# Patient Record
Sex: Male | Born: 1935 | Race: White | Hispanic: No | Marital: Married | Smoking: Former smoker
Health system: Southern US, Community
[De-identification: ages and names within clinical notes are randomized; demographics above are authoritative.]

## PROBLEM LIST (undated history)

## (undated) DIAGNOSIS — G4733 Obstructive sleep apnea (adult) (pediatric): Secondary | ICD-10-CM

## (undated) DIAGNOSIS — I4821 Permanent atrial fibrillation: Secondary | ICD-10-CM

## (undated) DIAGNOSIS — F32A Depression, unspecified: Secondary | ICD-10-CM

## (undated) DIAGNOSIS — C4491 Basal cell carcinoma of skin, unspecified: Secondary | ICD-10-CM

## (undated) DIAGNOSIS — C4492 Squamous cell carcinoma of skin, unspecified: Secondary | ICD-10-CM

## (undated) DIAGNOSIS — M5030 Other cervical disc degeneration, unspecified cervical region: Secondary | ICD-10-CM

## (undated) DIAGNOSIS — R569 Unspecified convulsions: Secondary | ICD-10-CM

## (undated) DIAGNOSIS — I2510 Atherosclerotic heart disease of native coronary artery without angina pectoris: Secondary | ICD-10-CM

## (undated) DIAGNOSIS — F329 Major depressive disorder, single episode, unspecified: Secondary | ICD-10-CM

## (undated) DIAGNOSIS — I639 Cerebral infarction, unspecified: Secondary | ICD-10-CM

## (undated) DIAGNOSIS — J189 Pneumonia, unspecified organism: Secondary | ICD-10-CM

## (undated) DIAGNOSIS — C61 Malignant neoplasm of prostate: Secondary | ICD-10-CM

## (undated) DIAGNOSIS — F0390 Unspecified dementia without behavioral disturbance: Secondary | ICD-10-CM

## (undated) DIAGNOSIS — I495 Sick sinus syndrome: Secondary | ICD-10-CM

## (undated) DIAGNOSIS — I739 Peripheral vascular disease, unspecified: Secondary | ICD-10-CM

## (undated) DIAGNOSIS — D229 Melanocytic nevi, unspecified: Secondary | ICD-10-CM

## (undated) DIAGNOSIS — E785 Hyperlipidemia, unspecified: Secondary | ICD-10-CM

## (undated) DIAGNOSIS — K219 Gastro-esophageal reflux disease without esophagitis: Secondary | ICD-10-CM

## (undated) DIAGNOSIS — I10 Essential (primary) hypertension: Secondary | ICD-10-CM

## (undated) DIAGNOSIS — Z87442 Personal history of urinary calculi: Secondary | ICD-10-CM

## (undated) DIAGNOSIS — J449 Chronic obstructive pulmonary disease, unspecified: Secondary | ICD-10-CM

## (undated) DIAGNOSIS — R413 Other amnesia: Secondary | ICD-10-CM

## (undated) HISTORY — DX: Gastro-esophageal reflux disease without esophagitis: K21.9

## (undated) HISTORY — PX: NECK SURGERY: SHX720

## (undated) HISTORY — DX: Atherosclerotic heart disease of native coronary artery without angina pectoris: I25.10

## (undated) HISTORY — DX: Depression, unspecified: F32.A

## (undated) HISTORY — DX: Obstructive sleep apnea (adult) (pediatric): G47.33

## (undated) HISTORY — DX: Other amnesia: R41.3

## (undated) HISTORY — DX: Other cervical disc degeneration, unspecified cervical region: M50.30

## (undated) HISTORY — DX: Permanent atrial fibrillation: I48.21

## (undated) HISTORY — DX: Essential (primary) hypertension: I10

## (undated) HISTORY — DX: Cerebral infarction, unspecified: I63.9

## (undated) HISTORY — DX: Sick sinus syndrome: I49.5

## (undated) HISTORY — PX: PACEMAKER INSERTION: SHX728

## (undated) HISTORY — DX: Malignant neoplasm of prostate: C61

## (undated) HISTORY — DX: Major depressive disorder, single episode, unspecified: F32.9

## (undated) HISTORY — DX: Peripheral vascular disease, unspecified: I73.9

## (undated) HISTORY — DX: Hyperlipidemia, unspecified: E78.5

## (undated) HISTORY — PX: INSERT / REPLACE / REMOVE PACEMAKER: SUR710

---
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
## 1990-04-26 DIAGNOSIS — D229 Melanocytic nevi, unspecified: Secondary | ICD-10-CM

## 1990-04-26 HISTORY — DX: Melanocytic nevi, unspecified: D22.9

## 1997-10-12 DIAGNOSIS — I639 Cerebral infarction, unspecified: Secondary | ICD-10-CM

## 1997-10-12 HISTORY — PX: OTHER SURGICAL HISTORY: SHX169

## 1997-10-12 HISTORY — DX: Cerebral infarction, unspecified: I63.9

## 1998-03-07 ENCOUNTER — Inpatient Hospital Stay (HOSPITAL_COMMUNITY): Admission: EM | Admit: 1998-03-07 | Discharge: 1998-03-15 | Payer: Self-pay | Admitting: *Deleted

## 1998-03-15 ENCOUNTER — Inpatient Hospital Stay (HOSPITAL_COMMUNITY)
Admission: RE | Admit: 1998-03-15 | Discharge: 1998-04-10 | Payer: Self-pay | Admitting: Physical Medicine & Rehabilitation

## 1998-04-10 ENCOUNTER — Encounter: Admission: RE | Admit: 1998-04-10 | Discharge: 1998-07-09 | Payer: Self-pay | Admitting: Internal Medicine

## 1998-06-20 ENCOUNTER — Ambulatory Visit (HOSPITAL_COMMUNITY): Admission: RE | Admit: 1998-06-20 | Discharge: 1998-06-20 | Payer: Self-pay | Admitting: *Deleted

## 1998-08-08 ENCOUNTER — Encounter: Payer: Self-pay | Admitting: *Deleted

## 1998-08-09 ENCOUNTER — Inpatient Hospital Stay: Admission: RE | Admit: 1998-08-09 | Discharge: 1998-08-11 | Payer: Self-pay | Admitting: *Deleted

## 1999-04-21 ENCOUNTER — Encounter: Admission: RE | Admit: 1999-04-21 | Discharge: 1999-07-20 | Payer: Self-pay | Admitting: *Deleted

## 1999-07-22 ENCOUNTER — Ambulatory Visit (HOSPITAL_COMMUNITY): Admission: RE | Admit: 1999-07-22 | Discharge: 1999-07-22 | Payer: Self-pay | Admitting: Pediatrics

## 1999-08-12 ENCOUNTER — Encounter: Admission: RE | Admit: 1999-08-12 | Discharge: 1999-09-10 | Payer: Self-pay | Admitting: *Deleted

## 2000-06-16 ENCOUNTER — Ambulatory Visit (HOSPITAL_BASED_OUTPATIENT_CLINIC_OR_DEPARTMENT_OTHER): Admission: RE | Admit: 2000-06-16 | Discharge: 2000-06-16 | Payer: Self-pay | Admitting: *Deleted

## 2000-06-16 ENCOUNTER — Encounter (INDEPENDENT_AMBULATORY_CARE_PROVIDER_SITE_OTHER): Payer: Self-pay | Admitting: *Deleted

## 2000-10-07 ENCOUNTER — Ambulatory Visit (HOSPITAL_COMMUNITY): Admission: RE | Admit: 2000-10-07 | Discharge: 2000-10-07 | Payer: Self-pay | Admitting: Internal Medicine

## 2000-10-12 HISTORY — PX: CORONARY ARTERY BYPASS GRAFT: SHX141

## 2001-06-07 ENCOUNTER — Encounter: Payer: Self-pay | Admitting: *Deleted

## 2001-06-08 ENCOUNTER — Inpatient Hospital Stay (HOSPITAL_COMMUNITY): Admission: RE | Admit: 2001-06-08 | Discharge: 2001-06-19 | Payer: Self-pay | Admitting: *Deleted

## 2001-06-09 ENCOUNTER — Encounter: Payer: Self-pay | Admitting: Cardiothoracic Surgery

## 2001-06-10 ENCOUNTER — Encounter: Payer: Self-pay | Admitting: Cardiothoracic Surgery

## 2001-06-11 ENCOUNTER — Encounter: Payer: Self-pay | Admitting: Cardiothoracic Surgery

## 2001-06-11 ENCOUNTER — Encounter: Payer: Self-pay | Admitting: Thoracic Surgery (Cardiothoracic Vascular Surgery)

## 2001-06-12 ENCOUNTER — Encounter: Payer: Self-pay | Admitting: Thoracic Surgery (Cardiothoracic Vascular Surgery)

## 2001-06-13 ENCOUNTER — Encounter: Payer: Self-pay | Admitting: Thoracic Surgery (Cardiothoracic Vascular Surgery)

## 2001-06-14 ENCOUNTER — Encounter: Payer: Self-pay | Admitting: Cardiothoracic Surgery

## 2001-06-14 ENCOUNTER — Encounter: Payer: Self-pay | Admitting: Thoracic Surgery (Cardiothoracic Vascular Surgery)

## 2001-06-16 ENCOUNTER — Encounter: Payer: Self-pay | Admitting: Cardiothoracic Surgery

## 2001-06-17 ENCOUNTER — Encounter: Payer: Self-pay | Admitting: Cardiothoracic Surgery

## 2001-07-12 ENCOUNTER — Encounter: Payer: Self-pay | Admitting: Cardiothoracic Surgery

## 2001-07-12 ENCOUNTER — Ambulatory Visit (HOSPITAL_COMMUNITY): Admission: RE | Admit: 2001-07-12 | Discharge: 2001-07-12 | Payer: Self-pay | Admitting: Cardiothoracic Surgery

## 2001-07-15 ENCOUNTER — Encounter: Payer: Self-pay | Admitting: Cardiothoracic Surgery

## 2001-07-15 ENCOUNTER — Encounter: Admission: RE | Admit: 2001-07-15 | Discharge: 2001-07-15 | Payer: Self-pay | Admitting: Cardiothoracic Surgery

## 2001-07-19 ENCOUNTER — Encounter (HOSPITAL_COMMUNITY): Admission: RE | Admit: 2001-07-19 | Discharge: 2001-10-17 | Payer: Self-pay | Admitting: *Deleted

## 2001-08-10 ENCOUNTER — Encounter (INDEPENDENT_AMBULATORY_CARE_PROVIDER_SITE_OTHER): Payer: Self-pay | Admitting: Specialist

## 2001-08-10 ENCOUNTER — Ambulatory Visit (HOSPITAL_COMMUNITY): Admission: RE | Admit: 2001-08-10 | Discharge: 2001-08-10 | Payer: Self-pay | Admitting: *Deleted

## 2001-08-10 ENCOUNTER — Encounter: Payer: Self-pay | Admitting: *Deleted

## 2001-10-28 ENCOUNTER — Encounter: Admission: RE | Admit: 2001-10-28 | Discharge: 2001-10-28 | Payer: Self-pay | Admitting: Cardiothoracic Surgery

## 2001-10-28 ENCOUNTER — Encounter: Payer: Self-pay | Admitting: Cardiothoracic Surgery

## 2002-12-11 ENCOUNTER — Encounter (INDEPENDENT_AMBULATORY_CARE_PROVIDER_SITE_OTHER): Payer: Self-pay

## 2002-12-11 ENCOUNTER — Ambulatory Visit (HOSPITAL_COMMUNITY): Admission: RE | Admit: 2002-12-11 | Discharge: 2002-12-11 | Payer: Self-pay | Admitting: Gastroenterology

## 2003-02-19 ENCOUNTER — Emergency Department (HOSPITAL_COMMUNITY): Admission: EM | Admit: 2003-02-19 | Discharge: 2003-02-19 | Payer: Self-pay | Admitting: Emergency Medicine

## 2003-02-19 ENCOUNTER — Encounter: Payer: Self-pay | Admitting: Emergency Medicine

## 2003-05-23 ENCOUNTER — Ambulatory Visit (HOSPITAL_COMMUNITY): Admission: RE | Admit: 2003-05-23 | Discharge: 2003-05-23 | Payer: Self-pay | Admitting: *Deleted

## 2004-10-12 DIAGNOSIS — C61 Malignant neoplasm of prostate: Secondary | ICD-10-CM

## 2004-10-12 HISTORY — DX: Malignant neoplasm of prostate: C61

## 2004-12-10 ENCOUNTER — Ambulatory Visit: Admission: RE | Admit: 2004-12-10 | Discharge: 2005-03-10 | Payer: Self-pay | Admitting: Radiation Oncology

## 2005-03-26 ENCOUNTER — Ambulatory Visit: Admission: RE | Admit: 2005-03-26 | Discharge: 2005-03-26 | Payer: Self-pay | Admitting: Radiation Oncology

## 2005-05-25 DIAGNOSIS — C4491 Basal cell carcinoma of skin, unspecified: Secondary | ICD-10-CM

## 2005-05-25 DIAGNOSIS — C4492 Squamous cell carcinoma of skin, unspecified: Secondary | ICD-10-CM

## 2005-05-25 HISTORY — DX: Squamous cell carcinoma of skin, unspecified: C44.92

## 2005-05-25 HISTORY — DX: Basal cell carcinoma of skin, unspecified: C44.91

## 2008-06-22 ENCOUNTER — Encounter: Admission: RE | Admit: 2008-06-22 | Discharge: 2008-06-22 | Payer: Self-pay | Admitting: Pediatrics

## 2010-05-20 ENCOUNTER — Encounter: Admission: RE | Admit: 2010-05-20 | Discharge: 2010-05-20 | Payer: Self-pay | Admitting: Internal Medicine

## 2010-07-15 ENCOUNTER — Encounter (HOSPITAL_COMMUNITY)
Admission: RE | Admit: 2010-07-15 | Discharge: 2010-08-14 | Payer: Self-pay | Source: Home / Self Care | Admitting: Internal Medicine

## 2010-08-22 ENCOUNTER — Encounter (INDEPENDENT_AMBULATORY_CARE_PROVIDER_SITE_OTHER): Payer: Self-pay | Admitting: Cardiology

## 2010-08-22 ENCOUNTER — Ambulatory Visit (HOSPITAL_COMMUNITY): Admission: RE | Admit: 2010-08-22 | Discharge: 2010-08-22 | Payer: Self-pay | Admitting: Cardiology

## 2010-09-25 ENCOUNTER — Ambulatory Visit (HOSPITAL_COMMUNITY)
Admission: RE | Admit: 2010-09-25 | Discharge: 2010-09-25 | Payer: Self-pay | Source: Home / Self Care | Attending: Cardiology | Admitting: Cardiology

## 2010-10-16 ENCOUNTER — Encounter: Payer: Self-pay | Admitting: Internal Medicine

## 2010-10-21 ENCOUNTER — Ambulatory Visit (HOSPITAL_COMMUNITY)
Admission: RE | Admit: 2010-10-21 | Discharge: 2010-10-21 | Payer: Self-pay | Source: Home / Self Care | Attending: Cardiology | Admitting: Cardiology

## 2010-10-31 NOTE — Op Note (Signed)
  NAME:  [name], [name] NO.:  [id number]  MEDICAL RECORD NO.:  [id number]          PATIENT TYPE:  OIB  LOCATION:  2854                         FACILITY:  [facility]  PHYSICIAN:  Jake Bathe, MD      DATE OF BIRTH:  1933-01-18  DATE OF PROCEDURE:  10/21/2010 DATE OF DISCHARGE:  10/21/2010                              OPERATIVE REPORT   SURGEON:  Loraine Leriche C. Anne Fu, MD  INDICATIONS:  Atrial flutter/fibrillation.  BRIEF HISTORY:  A 77 year old male post bypass surgery with recently discovered atrial fibrillation.  He had undergone a cardioversion without any antiarrhythmic therapy, which failed.  I then placed him on amiodarone and performed cardioversion again, which was successful for approximately 2-3 weeks.  He was then seen in the office and felt different and was found to be once again in atrial fibrillation with a heart rate around 115-118.  Prior to his cardioversion today, he did demonstrate an atrial flutter, which I have not seen previously with negative P-waves in II, III, and aVF possibly typical pattern.  After informed consent was obtained and the risks of stroke, heart attack, death and arrhythmia were explained to the patient and his family, Anesthesiology assisted with propofol administration of 40 mg. A single biphasic 200-joule shock was administered to the chest wall and successfully cardioverted him from his atrial flutter at a heart rate of 115 to sinus rhythm at a heart rate of 80.  Upon discharge, I did reinitiate metoprolol XL 25 mg once a day. Previously, I discontinued this due to marked sinus bradycardia with heart rate in the upper 40s.  I also have an appointment set up for him on November 20, 2010 to speak with Dr. Johney Frame given the complexity of his atrial fibrillation/flutter and return or reoccurrence of these arrhythmias.  Dr. Johney Frame may be able to discuss future treatment options for him.  FOLLOWUP:  I will see him back in 10 days  in the clinic.     Jake Bathe, MD     MCS/MEDQ  D:  10/21/2010  T:  10/22/2010  Job:  [id number]  Electronically Signed by Donato Schultz MD on 10/31/2010 07:03:26 AM

## 2010-11-21 DIAGNOSIS — I2510 Atherosclerotic heart disease of native coronary artery without angina pectoris: Secondary | ICD-10-CM | POA: Insufficient documentation

## 2010-11-21 DIAGNOSIS — E785 Hyperlipidemia, unspecified: Secondary | ICD-10-CM | POA: Insufficient documentation

## 2010-11-21 DIAGNOSIS — I10 Essential (primary) hypertension: Secondary | ICD-10-CM | POA: Insufficient documentation

## 2010-11-24 ENCOUNTER — Encounter: Payer: Self-pay | Admitting: Internal Medicine

## 2010-11-24 ENCOUNTER — Institutional Professional Consult (permissible substitution) (INDEPENDENT_AMBULATORY_CARE_PROVIDER_SITE_OTHER): Payer: Medicare Other | Admitting: Internal Medicine

## 2010-11-24 DIAGNOSIS — I495 Sick sinus syndrome: Secondary | ICD-10-CM | POA: Insufficient documentation

## 2010-11-24 DIAGNOSIS — I10 Essential (primary) hypertension: Secondary | ICD-10-CM

## 2010-11-24 DIAGNOSIS — I4891 Unspecified atrial fibrillation: Secondary | ICD-10-CM

## 2010-12-03 NOTE — Assessment & Plan Note (Signed)
Summary: nep/afib/per [id number] [phone] pt have medicare-mb notes on file...   Visit Type:  Initial Consult Referring Provider:  Dr Anne Fu Primary Provider:  Dr Donette Larry  CC:  A-fib.  History of Present Illness: Zachary Warren is a pleasant 77 yo WM with a h/o CAD s/p CABG, prior CVA, persistant atrial fibrillation, and atrial flutter who presents today for EP consultation.  He reports initially being diagnosed with atrial fibrillation 08/13/10 after presenting to Dr Anne Fu office "not feeling well".  He had decreased exercise tolerance at that time but was otherwise without complaint.  His wife states that this was a routine visit. He was initiated on pradaxa and had unsuccessful cardioversion 08/23/10.  He then was initiated on amiodarone and had a repeat cardioversion 09/25/10.  After 2-3 weeks, he presented with atrial flutter.  He was again cardioverted 10/21/10.  He states that after his third cardioversion that he has done well.  He has difficulty expressing himself, but does seem to state that he feels better in sinus rhythm.  In retrospect, his spouse states that he likely had afib with RVR post operatively following CABG in 2002.  Problems Prior to Update: 1)  Pvd  (ICD-443.9) 2)  Hyperlipidemia-mixed  (ICD-272.4) 3)  Hypertension, Unspecified  (ICD-401.9) 4)  Cad, Native Vessel  (ICD-414.01) 5)  Atrial Fibrillation  (ICD-427.31)  Current Medications (verified): 1)  Omeprazole 20 Mg Tbec (Omeprazole) .... Take 1 Tablet By Mouth Two Times A Day 2)  Potassium Chloride Crys Cr 20 Meq Cr-Tabs (Potassium Chloride Crys Cr) .... Take One Tablet By Mouth Two Times A Day 3)  Furosemide 40 Mg Tabs (Furosemide) .... Take 1 Tablet By Mouth Once A Day 4)  Amiodarone Hcl 200 Mg Tabs (Amiodarone Hcl) .... Take One Tablet By Mouth Daily 5)  Pradaxa 150 Mg Caps (Dabigatran Etexilate Mesylate) .... Take 1 Tablet By Mouth Two Times A Day 6)  Aspirin 81 Mg Tbec (Aspirin) .... Take One Tablet By Mouth Daily 7)   Fluoxetine Hcl 40 Mg Caps (Fluoxetine Hcl) .... Take 1 Capsule By Mouth Once A Day 8)  Vytorin 10-40 Mg Tabs (Ezetimibe-Simvastatin) .... Take One Tablet By Mouth Dailyat Bedtime 9)  Multivitamins  Tabs (Multiple Vitamin) .... Take 1 Tablet By Mouth Once A Day 10)  Topamax 50 Mg Tabs (Topiramate) .... Take 1 Tablet By Mouth Two Times A Day 11)  Lutein 20 Mg Caps (Lutein) .... Take 1 Capsule By Mouth Once A Day 12)  Metoprolol Succinate 25 Mg Xr24h-Tab (Metoprolol Succinate) .... Take One Tablet By Mouth Daily  Allergies (verified): 1)  ! * Altace  Past History:  Past Medical History: Prostate Ca -- 2006 s/p XRT CVA 1999 Cervical DDD Memory impairment Persistent Atrial Fibrillation and typical appearing atrial flutter CAD s/p 5v CABG 2002 Depression G E R D Hyperlipidemia Hypertension PVD Bradycardia recently diagnosed OSA, though pt has not had CPAP trial  Past Surgical History: diskectomy CABG 2002 CEA 1999  Family History: ovarian, pancreatic, and lung Ca  Social History: Pt lives in [place] with spouse.  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.  Tob- heavy but quit 1999.  ETOH- none.  Review of Systems       All systems are reviewed and negative except as listed in the HPI.   Vital Signs:  Patient profile:   77 year old male Height:      70 inches Weight:      232.50 pounds BMI:     33.48 Pulse rate:  43 / minute Pulse rhythm:   irregular Resp:     20 per minute BP sitting:   162 / 70  (left arm) Cuff size:   large  Vitals Entered By: Vikki Ports (November 24, 2010 12:07 PM)  Physical Exam  General:  elderly, chronically ill, very noticable expressive aphasia Head:  normocephalic and atraumatic Eyes:  PERRLA/EOM intact; conjunctiva and lids normal. Mouth:  Teeth, gums and palate normal. Oral mucosa normal. Neck:  supple Lungs:  Clear bilaterally to auscultation and percussion. Heart:  brady regular rhythm, no m/r/g Abdomen:   Bowel sounds positive; abdomen soft and non-tender without masses, organomegaly, or hernias noted. No hepatosplenomegaly. Msk:  Back normal, normal gait. Muscle strength and tone normal. Extremities:  No clubbing or cyanosis. Neurologic:  + expressive aphasia Skin:  Intact without lesions or rashes. Psych:  Normal affect.   TE Echocardiogram  Procedure date:  08/22/2010  Findings:        - Left ventricle: Systolic function was mildly reduced. The     estimated ejection fraction was in the range of 45% to 50%.   - Mitral valve: Mild regurgitation.   - Left atrium: No evidence of thrombus in the atrial cavity or     appendage. No evidence of thrombus in the atrial cavity or     appendage. There was spontaneous echo contrast ('smoke'). Emptying     velocity was reduced.   - Right atrium: No evidence of thrombus in the atrial cavity or     appendage.   - Atrial septum: Agitated saline contrast study showed a small     right-to-left atrial level shunt, late, in the baseline state.   - Tricuspid valve: No evidence of vegetation.   - Superior vena cava: The study excluded a thrombus.   Recommendations: Cardioversion attempted x 3 - unsucessful.     EKG  Procedure date:  11/24/2010  Findings:      sinus bradycardia 43 bpm, PR 194, QRS 92, Qtc 442, otherwise normal ekg  Echocardiogram  Procedure date:  08/19/2010  Findings:      NL LV size, LVEF 40-50%, mild RV dilitation, LA size 54mm, mild Zachary, mild TR  Impression & Recommendations:  Problem # 1:  ATRIAL FIBRILLATION (ICD-427.31) Zachary Warren is a pleasant 77 yo WM with a h/o persistant atrial fibrillation and also atrial flutter.  He is presently maintaining sinus rhythm with amiodarone.  He has a very large LA (54mm) by recent echo.  I would therefore suspect that our ability to maintain sinus rhythm longterm is limited.  Therapeutic strategies for afib and atrial fibrillation including medicine and ablation were discussed in  detail with the patient today.  Given the patients age and comorbidities, I would recommend medical therapy over ablation.  The patient agrees with this approach.  We will therefore continue amiodarone at this time.  He will have LFTs/TFTs followed by Dr Anne Fu. He should continue pradaxa with close following of CrCl by Dr Anne Fu. IF he develops recurrent symptomatic afib or atrial flutter, then we will reconsider our options at that time.  Given his age and comorbidities, I would likely consider rate control, which could possibly require AV nodal ablation/ PPM.   Pt encouraged to follow-up with neurology regarding the results of his recent sleep study (I do not have results today).  If he has sleep apnea, he may benefit from CPAP trial.  Problem # 2:  BRADYCARDIA-TACHYCARDIA SYNDROME (ICD-427.81) minimal sypmtoms with bradcardia, but has had significant  elevation in heart rates during afib in the past. We will stop metoprolol today.  If he develops afib with RVR, he will need to consider restarting metoprolol at that time. We may consider pacemaker implantation if his bradcardia becomes more of an issue.  Problem # 3:  HYPERTENSION, UNSPECIFIED (ICD-401.9) above goal salt restriction pt to follow-up with Dr Caro Hight for BP management.  Problem # 4:  CAD, NATIVE VESSEL (ICD-414.01) no symptoms of ischemia  Other Orders: EKG w/ Interpretation (93000)  Patient Instructions: 1)  Your physician has recommended you make the following change in your medication: stop  Metoprolol (toprol) 2)  Your physician recommends that you schedule a follow-up appointment in: 6 weeks with Dr.Suriyah Vergara

## 2010-12-17 ENCOUNTER — Encounter (INDEPENDENT_AMBULATORY_CARE_PROVIDER_SITE_OTHER): Payer: Self-pay | Admitting: *Deleted

## 2010-12-23 NOTE — Letter (Signed)
Summary: Zachary Warren 2009-2012  [facility] 2009-2012   Imported By: Marylou Mccoy 12/17/2010 12:12:24  _____________________________________________________________________  External Attachment:    Type:   Image     Comment:   External Document

## 2010-12-23 NOTE — Letter (Signed)
Summary: Appointment - Reschedule  [facility], Main Office  [address]   [place], [place] [address]   Phone: [phone]  Fax: [phone]     December 17, 2010 MRN: [id number]   [facility] [address] [place], [place]  [address]   Dear Mr. Siefert,   Due to a change in our office schedule, your appointment on 01-08-11 at  9:40a            must be changed.  It is very important that we reach you to reschedule this appointment. We look forward to participating in your health care needs. Please contact us at the number listed above at your earliest convenience to reschedule this appointment.     Sincerely,  Glass blower/designer

## 2011-01-08 ENCOUNTER — Ambulatory Visit: Payer: PRIVATE HEALTH INSURANCE | Admitting: Internal Medicine

## 2011-01-29 ENCOUNTER — Encounter: Payer: Self-pay | Admitting: Internal Medicine

## 2011-01-29 ENCOUNTER — Ambulatory Visit (INDEPENDENT_AMBULATORY_CARE_PROVIDER_SITE_OTHER): Payer: Medicare Other | Admitting: Internal Medicine

## 2011-01-29 DIAGNOSIS — I2510 Atherosclerotic heart disease of native coronary artery without angina pectoris: Secondary | ICD-10-CM

## 2011-01-29 DIAGNOSIS — I10 Essential (primary) hypertension: Secondary | ICD-10-CM

## 2011-01-29 DIAGNOSIS — I4891 Unspecified atrial fibrillation: Secondary | ICD-10-CM

## 2011-01-29 DIAGNOSIS — I495 Sick sinus syndrome: Secondary | ICD-10-CM

## 2011-01-29 NOTE — Assessment & Plan Note (Signed)
Stable. No changes today. 

## 2011-01-29 NOTE — Assessment & Plan Note (Signed)
The patient has tachy/brady syndrome.  Pacemaker implantation would be a reasonable option.  I had a long discussion today with the patient and his family about possible PPM implantation.  The patient wishes to defer PPM at this time.  If his symptoms of bradycardia worsen or he develops progressive tachycardia, then we will need to consider PPM at that time.

## 2011-01-29 NOTE — Assessment & Plan Note (Signed)
He has a h/o atrial fibrillation and atrial flutter.  He is presently maintaining sinus rhythm with amiodarone. I have suggested decreasing amiodarone to 100mg  daily, however the patient and his family are reluctant to do so due to fears of recurrent atrial fibrillation. He will continue pradaxa for stroke prevention.  Dr Anne Fu with follow creatinine clearance.

## 2011-01-29 NOTE — Patient Instructions (Signed)
Your physician recommends that you schedule a follow-up appointment in: 6 weeks with Dr. Allred.  Your physician recommends that you continue on your current medications as directed. Please refer to the Current Medication list given to you today.  

## 2011-01-29 NOTE — Progress Notes (Signed)
The patient presents today for routine electrophysiology followup.  Since last being seen in our clinic, he reports doing reasonably well.  He tried stopping metoprolol but had significant tachycardia which required that he restart the medicine.  He is now taking metoprolol 12.5mg  qam.  He has not had any further tachycardia, however his HR remains 40s.  He reports some daytime somnolence and fatigue but appears to be doing reasonably well.  Today, he denies symptoms of palpitations, chest pain, shortness of breath, orthopnea, PND, lower extremity edema, dizziness, presyncope, syncope, or new neurologic sequela.  The patient feels that he is tolerating medications without difficulties and is otherwise without complaint today.   Past Medical History  Diagnosis Date  . Prostate cancer 2006    s/p XRT  . CVA (cerebral infarction) 1999  . DDD (degenerative disc disease), cervical   . Memory impairment   . Persistent atrial fibrillation     and typical appearing atrial flutter  . Coronary artery disease     s/p 5v CABG 2002  . Depression   . GERD (gastroesophageal reflux disease)   . Hyperlipidemia   . Hypertension   . PVD (peripheral vascular disease)   . Tachycardia-bradycardia   . OSA (obstructive sleep apnea)     Recently diagnosed though pt. has not had CPAP trial   Past Surgical History  Procedure Date  . Coronary artery bypass graft 2002  . Cea 1999    Current Outpatient Prescriptions  Medication Sig Dispense Refill  . amiodarone (PACERONE) 200 MG tablet Take 200 mg by mouth daily.        Marland Kitchen aspirin 81 MG tablet Take 81 mg by mouth daily.        . dabigatran (PRADAXA) 150 MG CAPS Take 150 mg by mouth every 12 (twelve) hours.        Marland Kitchen ezetimibe-simvastatin (VYTORIN) 10-40 MG per tablet Take 1 tablet by mouth at bedtime.        Marland Kitchen FLUoxetine (PROZAC) 40 MG capsule Take 40 mg by mouth daily.        . furosemide (LASIX) 40 MG tablet Take 40 mg by mouth daily.        . Lutein 20 MG  CAPS Take by mouth.        . metoprolol tartrate (LOPRESSOR) 25 MG tablet 1/2 tab po qd       . Multiple Vitamins-Minerals (MULTIVITAMIN WITH MINERALS) tablet Take 1 tablet by mouth daily.        Marland Kitchen omeprazole (PRILOSEC OTC) 20 MG tablet Take 20 mg by mouth 2 (two) times daily.        . potassium chloride SA (K-DUR,KLOR-CON) 20 MEQ tablet Take 20 mEq by mouth 2 (two) times daily.        Marland Kitchen topiramate (TOPAMAX) 50 MG tablet Take 50 mg by mouth 2 (two) times daily.          Allergies  Allergen Reactions  . Ramipril     REACTION: cough    History   Social History  . Marital Status: Married    Spouse Name: N/A    Number of Children: N/A  . Years of Education: N/A   Occupational History  . Not on file.   Social History Main Topics  . Smoking status: Former Smoker    Quit date: 10/12/1997  . Smokeless tobacco: Not on file  . Alcohol Use: No  . Drug Use: No  . Sexually Active: Not on file   Other Topics  Concern  . Not on file   Social History Narrative   Pt lives in [place] with spouse.  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    Family History  Problem Relation Age of Onset  . Ovarian cancer    . Pancreatic cancer    . Lung cancer      ROS-  All systems are reviewed and are negative except as outlined in the HPI above  Physical Exam: Filed Vitals:   01/29/11 1344  BP: 140/70  Pulse: 44  Resp: 14  Height: 5\' 9"  (1.753 m)  Weight: 229 lb (103.874 kg)    GEN- elderly male, flat affect, alert and oriented x 3 today.   Head- normocephalic, atraumatic Eyes-  Sclera clear, conjunctiva pink Ears- hearing intact Oropharynx- clear Neck- supple, no JVP Lymph- no cervical lymphadenopathy Lungs- Clear to ausculation bilaterally, normal work of breathing Heart- bradycardic rate and regular rhythm, no murmurs, rubs or gallops, PMI not laterally displaced GI- soft, NT, ND, + BS Extremities- no clubbing, cyanosis, trace edema MS- no significant deformity  or atrophy Skin- no rash or lesion Psych- euthymic mood, flat affect

## 2011-01-29 NOTE — Assessment & Plan Note (Signed)
No ischemic symptoms No changes today 

## 2011-02-27 NOTE — Op Note (Signed)
[place]  Patient:    Zachary Warren, Zachary Warren Visit Number: [id number] MRN: [id number]          Service Type: MED Location: 2300 [phone] Attending Physician:  Mikey Bussing Dictated by:   Mikey Bussing, M.D. Proc. Date: 06/09/01 Admit Date:  06/07/2001   CC:         Meade Maw, M.D., [facility]   Operative Report  PROCEDURE:  Coronary artery bypass grafting x 5 (left internal mammary artery to the left anterior descending artery, saphenous vein graft [sequential] to obtuse marginal #1 and obtuse marginal #2, sequential saphenous vein graft to right ventricular marginal and posterior descending).  PREOPERATIVE DIAGNOSES:  Class III progressive angina with severe three-vessel coronary artery disease and left main stenosis.  POSTOPERATIVE DIAGNOSES:  Class III progressive angina with severe three-vessel coronary artery disease and left main stenosis.  SURGEON:  Mikey Bussing, M.D.  ASSISTANT:  Tollie Pizza. Collins, P.A.-C.  ANESTHESIA:  General.  INDICATIONS:  The patient is a 77 year old white male who presents with dyspnea on exertion and exertional chest tightness.  A stress test was positive and cardiac catheterization by Dr. Fraser Din demonstrated left main stenosis with three-vessel disease.  He was referred for a surgical coronary revascularization.  Past history is positive for a right body CVA in 1999 from which he has gained good, but not complete, recovery.  Prior to the operation, I reviewed the results of the cardiac catheterization with the patient and wife.  I discussed the indications and expected benefits of coronary bypass surgery.  I discussed the major aspects of the operation including the location of the surgical incisions, the choice of conduit, the use of general anesthesia and cardiopulmonary bypass, and the expected postoperative recovery.  I reviewed the risks associated with this operation  including the risks of MI, CVA, bleeding, infection, and death.  I reviewed the alternatives to surgical therapy for treatment of his coronary artery disease.  He understood these implications for the surgery and agreed to proceed with the operation as planned under informed consent.  OPERATIVE FINDINGS:  The patients body habitus made exposure of the back of the heart difficult.  The right coronary system was diffusely diseased and would not be an adequate vessel for redo grafting.  The LAD had diffuse disease, and the mammary graft was placed distally towards the apex of the left ventricle.  The circumflex vessels were difficult to visualize due to the patients body habitus.  The saphenous vein was of average quality.  PROCEDURE:  The patient was brought to the operating room and placed supine on the operating room table where general anesthesia was induced under invasive hemodynamic monitoring.  The chest, abdomen, and legs were prepped with Betadine and draped as a sterile field.  A median sternotomy was performed as the saphenous vein was harvested from the right leg.  The internal mammary artery was harvested as a pedicle graft from its origin at the subclavian vessels and was a good vessel with excellent flow.  Heparin was administered, and the ACT was documented as being therapeutic.  The pursestrings were placed in the ascending aorta and right atrium.  The patient was cannulated and placed on bypass and cooled to 32 degrees.  The coronaries were identified and the mammary artery and vein grafts were prepared for the distal anastomoses. A cardioplegic cannula was placed and the patient was cooled to 28 degrees. As the aortic crossclamp was applied,  500 cc of cold blood cardioplegia was delivered to the aortic root with immediate cardioplegic arrest and ______ dropping less than 12 degrees.  A topical ice saline slush was used to augment myocardial preservation and a pericardial  insulator pad was used to protect the left phrenic nerve.  The distal coronary anastomoses were then performed.  The first graft constructed was the sequential vein graft to the RV marginal continuing to the posterior descending.  The RV marginal had a proximal 99% stenosis and a side-to-side anastomosis with the vein was performed using running 7-0 Prolene.  This then continued to the posterior descending which was totally occluded proximally.  An end-to-side anastomosis between the vein and the posterior descending was performed using running 7-0 Prolene.  There was good flow through the graft.  The second vein was then prepared for the sequential grafting to the OM-1 and OM-2.  The OM-1 had a proximal 80% stenosis and a side branch to the vein was sewn side-to-side with a running 7-0 Prolene with good flow through the graft.  The fourth distal anastomosis was the continuation of the sequential vein to the OM-2 which was a larger 1.8- to 2.0-mm vessel with proximal 60-70% stenosis and the end of the vein was sewn end-to-side with a running 7-0 Prolene.  There was excellent flow through the graft.  Cardioplegia was re-dosed.  The fifth distal anastomosis was the distal LAD using the left internal mammary artery.  The mammary pedicle was brought through an opening created in the left lateral pericardium, was brought down on the LAD and sewn end-to-side with a running 8-0 Prolene. There was excellent flow through the anastomosis with immediate rise in ______ temperature after release of the pedicle clamp on the mammary artery. The mammary pedicle was secured to the epicardium and the aortic crossclamp was removed.  The heart resumed a spontaneous rhythm.  Using a partial occluding clamp, two proximal vein anastomoses were placed using a 4.0-mm punch and running 6-0 Prolene.  The grafts were then perfused and there was good flow with hemostasis at the proximal and distal anastomotic sites.   The patient was rewarmed to 37 degrees.  Pacing wires were applied.  The patient was then  weaned from bypass without difficulty without ionotropes.  Protamine was administered and the cannulas were removed.  The mediastinum was irrigated with a warm antibiotic irrigation.  The leg incision was irrigated and closed in a standard fashion.  The superior pericardial fat was closed over the aorta and vein grafts.  Two mediastinal and a left pleural chest tube were placed and brought out through separate incisions.  The sternum was reapproximated with interrupted steel wire.  The pectoralis fascia was closed with an interrupted #1 Vicryl.  The subcutaneous and skin were closed with a running Vicryl.  Sterile dressings were applied.  Total bypass time was 135 minutes with aortic crossclamp time of 65 minutes. Dictated by:   Mikey Bussing, M.D. Attending Physician:  Mikey Bussing DD:  06/09/01 TD:  06/09/01 Job: [id number]

## 2011-02-27 NOTE — Discharge Summary (Signed)
. [facility]  Patient:    Zachary Warren, Zachary Warren Visit Number: [id number] MRN: [id number]          Service Type: MED Location: 2000 2010 01 Attending Physician:  Mikey Bussing Md Dictated by:   Areta Haber, Rochel Brome. Admit Date:  06/08/2001 Discharge Date: 06/19/2001   CC:         Meade Maw, M.D.  Tyson Dense, M.D.   Discharge Summary  HISTORY OF PRESENT ILLNESS:   This is a 77 year old male referred to Dr. Donata Clay in consultation by Dr. Fraser Din for evaluation and therapy of severe three-vessel coronary artery disease with a 50% left main stenosis. The patient is a 77 year old white male with a remote history of myocardial infarction.  At age 58, had a history of peripheral vascular disease, who has had progressive dyspnea on exertion over the past several weeks.  He has been unable to do his normal daily activities and he states he is ready for a nap even after he gets out of bed and gets dressed in the morning.  There have been no specific episodes of chest pain or chest tightness.  The patient was admitted this hospitalization after a Cardiolite showed ejection fraction of 55% with a defect in the inferior wall, probably related to the remote myocardial infarction in some defect anteriorly.  He was admitted for cardiac catheterization and further treatment as indicated.  PAST MEDICAL HISTORY: 1. A right body cerebrovascular accident in 1999 with some residual weakness    and dysphagia.  He subsequently underwent a left carotid endarterectomy by    Dr. Liliane Bade in October 1999. 2. Remote history of MI at age 23. 3. Benign prostatic hyperplasia. 4. Depression following a stroke. 5. Dyslipidemia. 6. Cervical disk disease, status post anterior cervical laminectomy 15 years    ago.  MEDICATIONS ON ADMISSION: 1. Zocor 40 mg q.d. 2. Aspirin 1 q.d. 3. Cardura 2 mg q.d. 4. Prozac 40 mg q.d. 5. Multivitamin 1 q.d.  ALLERGIES:   No known drug allergies.  FAMILY HISTORY/SOCIAL HISTORY/REVIEW OF SYMPTOMS/PHYSICAL EXAM:  Please see the history and physical done at the time of admission.  PAST SURGICAL HISTORY: 1. Carotid endarterectomy. 2. Left cataract excision of anterior cervical laminectomy and tonsillectomy.  HOSPITAL COURSE:  The patient was admitted and taken for cardiac catheterization where the study demonstrated 50% left main stenosis, total occlusion of the right coronary artery, 80% stenosis of the mid LAD and a 30-40% stenosis of the circumflex marginals.  Ejection fraction was well preserved and his end diastolic pressure was 14 mmHg.  He was as stated, referred to Dr. Donata Clay, who evaluated the patient and his studies and agreed with recommendations to proceed with surgical revascularization and this was undertaken on June 09, 2001, the following procedure was performed: Coronary artery bypass grafting x 5, the following grafts were placed: 1. Left internal mammary artery to the LAD. 2. Sequential saphenous vein graft to the right ventricular marginal and the    posterior descending. 3. Sequential saphenous vein graft to obtuse marginal #1 and obtuse marginal    #2. The patient tolerated the procedure well and was taken to the surgical intensive care unit in stable condition.  POSTOPERATIVE HOSPITAL COURSE:  The patient has done well.  He has had some minor cardiac dysrhythmias, specifically atrial flutter and fibrillation.  He has been chemically cardioverted with his current regimen to a normal sinus rhythm.  He also postoperatively had some difficulties  with secretions and was treated with a course of vancomycin and Tequin for a bronchitis with the Tequin to be continued as an outpatient.  Additionally, the patient did have a postoperative anemia and has required a transfusion on two occasions.  Most recent hematocrit dated June 17, 2001 was 22% for which he will receive an additional  unit and hematocrit will be checked prior to discharge. Additionally, the patient has been started on iron.  Currently, the patient is felt to be quite stable.  His incisions are healing well without signs of infection.  He has tolerated a gentle diuresis, which also will continue as an outpatient for a short time.  He is tolerating routine activity commensurate for level of postoperative convalescence using routine cardiac rehabilitation phase I modalities.  His overall status was felt to be quite stable for tentative discharge in the morning of June 18, 2001 pending morning round re-evaluation.  FINAL DIAGNOSIS:  Coronary artery disease as described above.  OTHER DIAGNOSES:  1. Previous right body cerebrovascular accident.  2. Remote myocardial infarction.  3. Benign prostatic hyperplasia.  4. Depression.  5. History of cervical disc disease, status post anterior cervical     laminectomy.  6. Postoperative atrial fibrillation.  7. Postoperative anemia.  8. Postoperative bronchitis.  MEDICATIONS ON DISCHARGE:  1. Zocor 40 mg q.d.  2. Prozac 40 mg q.d.  3. Cardura 2 mg q.h.s.  4. Tequin 400 mg q.d. for 8 additional days.  5. Tylox 1-2 q.6h. p.r.n. as needed for pain.  6. Combivent 2 puffs q.i.d.  7. Cordarone 200 mg q.12h.  8. Lasix 40 mg q.d. x 1 week.  9. Potassium chloride 20 mEq q.d. x 1 week. 10. Niferex 150 mg q.d. 11. Toprol XL 25 mg q.d. 12. Aspirin 325 mg q.d.  INSTRUCTIONS:  The patient received written instructions regarding medications, activity, diet, wound care and followup.  FOLLOWUP:  In 2 weeks with Dr. Fraser Din, 3 weeks with Dr. Donata Clay.  CONDITION ON DISCHARGE:  Stable and improving. Dictated by:   Areta Haber, Rochel Brome. Attending Physician:  Mikey Bussing Md DD:  06/17/01 TD:  06/18/01 Job: [id number]

## 2011-02-27 NOTE — Op Note (Signed)
. [facility]  Patient:    [name], [name]                      MRN: [id number] Proc. Date: 06/16/00 Adm. Date:  [id number] Attending:  Melody Comas.                           Operative Report  PREOPERATIVE DIAGNOSIS:  A 2.8 cm junctional melanocytic nevus with marked atypia of the left forearm.  POSTOPERATIVE DIAGNOSIS:  A 2.8 cm junctional melanocytic nevus with marked atypia of the left forearm.  PROCEDURE PERFORMED:  Excisional biopsy of 2.8 cm junctional melanocytic nevus with marked atypia of the left forearm.  SURGEON:  Janet Berlin. Dan Humphreys, M.D.  ASSISTANT:  None.  ANESTHESIA:  Local/MAC.  INDICATIONS:  Zachary Warren is a 77 year old man, who was referred to me in August 2001 for management of a skin lesion involving the left radial/dorsal forearm.  He has undergone multiple biopsies of this area with failure to clear the lesion.  Final pathology on a most recent specimen showed this to be a junction melanocytic nevus with marked atypia.  An earlier lentigo maligna could not be excluded.  The patient is taken to the operating room at this time for excisional biopsy.  This is the initial procedure that I have performed on him.  DESCRIPTION OF PROCEDURE:  The patient was greeted in the preoperative holding area and then brought back to the operating room, where he was placed on the table in the supine position.  He was appropriately sedated by the anesthesia staff.  The left forearm was sterilely prepped and draped.  I infiltrated the excision site with 1% Xylocaine containing epinephrine.  A full-thickness, fusiform excision was then carried out with grossly clear tissue margins.  I marked the radial end of the specimen with a suture for orientation by the pathologist.  The specimen was passed off the operative field.  Individual bleeding points were controlled with the electrocautery.  The wound was then closed in layers.  Interrupted,  buried dermal sutures of 3-0 Vicryl were placed for wound edge approximation.  This was followed by a running subcuticular suture of 4-0 Monocryl and then placement of Steri-Strips.  A light compressive dressing was then placed.  The patient will be subsequently discharged home.  I gave wound care instructions to his wife.  I will plan on seeing him back in the office next week for wound check and review of his final pathology report. DD:  06/16/00 TD:  06/16/00 Job: [id number]

## 2011-02-27 NOTE — Consult Note (Signed)
[place]  Patient:    Zachary Warren, Zachary Warren Visit Number: [id number] MRN: [id number]          Service Type: CAT Location: [facility] 2899 15 Attending Physician:  Meade Maw A Dictated by:   Mikey Bussing, M.D. Proc. Date: 06/07/01 Adm. Date:  06/07/2001   CC:         [facility], Meade Maw, M.D.  CVTS Office   Consultation Report  REQUESTING PHYSICIAN:  Meade Maw, M.D.  PRIMARY CARE PHYSICIAN:  Zachary Warren, M.D.  CHIEF COMPLAINT:  Shortness of breath and decreased exercise tolerance.  REASON FOR CONSULTATION:  Severe 3-vessel coronary artery disease.  HISTORY OF PRESENT ILLNESS:  I was asked to see Zachary Warren in consultation by Dr. Fraser Din for evaluation and therapy of severe 3-vessel coronary artery disease with a 50% left main stenosis.  The patient is a 77 year old white male with a remote history of a myocardial infarction and history of peripheral vascular disease, who has had progressive dyspnea on exertion over the past several weeks.  He has been unable to do his normal daily activities and he states, he is ready for a nap after he gets out of bed and gets dressed in the morning.  There have been no specific episodes of chest pain or chest tightness.  He does state, he had an MI at age 80, at which time, he did have left substernal chest pain.  He was evaluated by Dr. Fraser Din with a Cardiolite which showed an ejection fraction of 55% with a defect on the inferior wall probably related to his remote MI and some defect anteriorly.  The patient underwent cardiac catheterization today by Dr. Fraser Din, which demonstrated 50% left main stenosis, total occlusion of the right coronary artery, 80% stenosis in the mid LAD, and 30-40% stenosis of circumflex marginals.  His ejection fraction was well-preserved and his end-diastolic pressure was 14 mmHg.  He was referred for surgical coronary revascularization.  PAST  MEDICAL HISTORY: 1. Right body CVA in May 1999 with some residual weakness and dysphagia.  He    subsequently underwent left carotid endarterectomy by Dr. Bud Face in    October 1999. 2. Remote history of of MI at age 43. 3. Benign prostatic hypertrophy. 4. Depression following his stroke. 5. Dyslipidemia. 6. Cervical disk disease, status post anterior cervical laminectomy 15 years    ago.  CURRENT MEDICATIONS:  Zocor 40 mg q.day, aspirin one q.day, Cardura 2 mg q.day, Prozac 40 mg q.day, multivitamin one q.day.  ALLERGIES:  None.  SURGICAL HISTORY:  Carotid endarterectomy, left cataract excision, anterior cervical laminectomy, and tonsillectomy.  FAMILY HISTORY:  The patient is married and is retired from [employer].  He retired prior to his stroke.  His father died of a stroke at age 58.  Mother died in her 73s of ovarian cancer.  His father did have a myocardial infarction.  The patient does not smoke since 1999 and does not use alcohol.  REVIEW OF SYSTEMS:  The patient is right-hand dominant.  He has had some rib fractures in the past.  He denies any history of DVTs, claudication or leg edema.  He denies any change in weight or change in bowel habits.  There is no history of productive cough, hemoptysis or asthma.  No history of abnormal chest x-ray.  Otherwise review of systems is negative.  PHYSICAL EXAMINATION:  GENERAL:  He is 5 foot 9 inches, weighs 230 pounds.  VITAL SIGNS:  Blood pressure is 118/70, heart rate is 64 and regular.  GENERAL APPEARANCE:  A pleasant middle-aged white male in the catheter laboratory holding area following cardiac catheterization.  He is not having chest pain.  HEENT EXAMINATION:  Reveals normocephalic, full EOMs.  Dentition under good repair.  NECK:  Supple.  He has a left endarterectomy scar.  No mass, carotid bruit or adenopathy.  LUNGS:  Clear without thoracic deformity.  CARDIAC EXAMINATION:  Reveals regular  rate and rhythm without S3 gallop, murmur or rub.  ABDOMINAL EXAMINATION:  Soft and nontender without organomegaly or abdominal bruit.  EXTREMITIES:  Without clubbing, edema or tenderness.  He has a compression dressing in his right groin.  SKIN:  Warm, clear and dry without rash.  VASCULAR EXAMINATION:  Reveals 2+ pulses in both radial areas, both femorals, and both pedal areas.  There is no venous insufficiency in his lower extremities.  NEUROLOGIC EXAMINATION:  Reveals some mild dysphagia and I did not detect any motor weakness.  He is limited to bedrest following cardiac catheterization.  RECTAL EXAMINATION:  Deferred.  LABORATORY DATA:  I reviewed his coronary arteriograms today and agree with Dr. Faythe Ghee interpretation of severe 3-vessel disease with left main involvement.  I agree with surgical revascularization of his coronary disease is best benefit for his symptoms, preservation of ventricular function, and improved long-term survival.  I discussed the procedure with the patient and his wife and he is agreeable to proceed, which will be scheduled for August 29.  Thank you very much for this consultation. Dictated by:   Mikey Bussing, M.D. Attending Physician:  Mora Appl DD:  06/07/01 TD:  06/07/01 Job: [id number]

## 2011-02-27 NOTE — Cardiovascular Report (Signed)
[place]  Patient:    Zachary Warren, Zachary Warren Visit Number: [id number] MRN: [id number]          Service Type: CAT Location: [facility] 2899 15 Attending Physician:  Mora Appl Dictated by:   Meade Maw, M.D. Proc. Date: 06/07/01 Adm. Date:  06/07/2001   CC:         Bishop Limbo, M.D.  Cardiac Catheterization Laboratory   Cardiac Catheterization  PROCEDURE:  Cardiac catheterization.  CARDIOLOGIST:  Meade Maw, M.D.  INDICATIONS:  This is a 77 year old gentleman who has had a known myocardial infarction at age 31.  He presents with ongoing fatigue.  An adenosine Cardiolite was performed.  This revealed an ejection fraction of 56%.  There was inferior basilar akinesis.  There was an inferior defect with minimal redistribution, and a fixed anterior defect.  There was no significant redistribution noted on the anterior wall.  Based on these findings, the patient was brought to the cardiac catheterization laboratory.  DESCRIPTION OF PROCEDURE:  After obtaining a written informed consent, the patient was brought to the cardiac catheterization laboratory in the postabsorptive state.  Preoperative sedation was achieved using IV Versed. The right groin was prepped and draped in the usual sterile fashion.  Local anesthesia was achieved using 1% Xylocaine.  A 6-French hemostasis sheath was placed into the right femoral artery using the modified Seldinger technique. Selective coronary angiography was performed using the JL4, JR4 Judkins catheters.  Multiple views were obtained.  The right internal mammary artery was engaged with the JR4 catheter.  Single plane ventriculogram was performed in the RAO position using the 6-French pigtail curved catheter.  Multiple views were obtained.  All catheter exchanges were made over a guide wire. Following a review of the films, it was felt that the patient was a candidate for revascularization by surgical  techniques.  A CV surgery consultation was obtained.  The patient was transferred to the holding area.  The hemostasis sheath was removed.  The hemostasis was achieved using digital pressure.  FINDINGS: PRESSURES Aortic pressure:  138/70. LV pressure:  138/14.  There was no gradient noted on pullback.  VENTRICULOGRAM:  A single plane ventriculogram revealed inferior basilar akinesis and anterior wall hypokinesis.  The ejection fraction of approximately 55%.  There was no significant mitral regurgitation noted.  CORONARY ANGIOGRAPHY:  There was significant calcification in the proximal ascending aorta, as well as in the proximal left anterior descending coronary artery.  RESULTS: 1. Left main coronary artery:  The left main coronary artery bifurcated    into the left anterior descending coronary artery and circumflex vessels.    There was a distal 50% lesion noted. 2. Left anterior descending coronary artery:  The left anterior descending    coronary artery gave rise to a small D-I, a small D-II, and a small D-III.    There was a proximal long 70% lesion which had significant haziness.    There was a mid 70%-80% lesion following the small D-III.  This was    followed by a 50%-60% lesion noted at the apex. 3. Circumflex coronary artery:  The circumflex vessel was felt to have an    ostial 70%-80% lesion.  The circumflex gave rise to a small OM-I, a    small OM-II, and a large trifurcating OM-III.  The OM-III had luminal    irregularities up to 30%-40%.  The distal circumflex was 100% occluded    and gave rise to homocollaterals, as well as left  to right collaterals. 4. Right coronary artery:  The right coronary artery was dominant and gave    rise to a small RV marginal-I and II.  There was a 70% lesion prior to    RV marginal-II.  This was followed by a 100% occlusion.  There were    homocollaterals as well as left to right collaterals from the circumflex    vessel.  IMPRESSION:   Critical disease involving the distal left main, the proximal lad, ostial circumflex, distal circumflex, and distal right coronary artery. He had preserved left ventricular function.  PLAN:  A CV surgery consultation was obtained.  The patient was admitted to telemetry. Dictated by:   Meade Maw, M.D. Attending Physician:  Mora Appl DD:  06/07/01 TD:  06/07/01 Job: [id number]

## 2011-02-27 NOTE — Op Note (Signed)
   NAME:  MINA, CARLISI NO.:  [id number]   MEDICAL RECORD NO.:  [id number]                   PATIENT TYPE:  AMB   LOCATION:  ENDO                                 FACILITY:  [facility]   PHYSICIAN:  Danise Edge, M.D.                DATE OF BIRTH:  Feb 27, 1933   DATE OF PROCEDURE:  12/11/2002  DATE OF DISCHARGE:                                 OPERATIVE REPORT   REFERRED BY:  1. Meade Maw, M.D.  2. Georgann Housekeeper, M.D.   PROCEDURE:  Colonoscopy and polypectomy.   ENDOSCOPIST:  Danise Edge, M.D.   INDICATIONS FOR PROCEDURE:  The patient is a 77 year old male, born on  29-Apr-1933.  The patient is scheduled to undergo a screening  colonoscopy with polypectomy to prevent colon cancer.  His colonoscopy  performed approximately seven years ago was normal.   PREMEDICATION:  Versed 5 mg, Demerol 30 mg.   ENDOSCOPE:  Olympus adult colonoscope.   DESCRIPTION OF PROCEDURE:  After obtaining an informed consent, the patient  was placed in the left lateral decubitus position.  I administered  intravenous Demerol and intravenous Versed, to achieve conscious sedation  for the procedure.  The patient's blood pressure, oxygen saturation and  cardiac rhythm were monitored throughout the procedure and documented in the  medical record.  An anal inspection was normal.  A digital rectal examination was normal.  The Olympus adult colonoscope was introduced into the rectum and advanced to  the cecum.  The colonic preparation for the examination today was excellent.  RECTUM:  From the proximal rectum a 2.0 mm sessile polyp was removed with  the electrocautery snare.  SIGMOID COLON AND DESCENDING COLON:  At 50 cm from the anal verge, from the  distal descending colon, a 0.5 mm sessile polyp was removed with the cold  biopsy forceps.  SPLENIC FLEXURE:  Normal.  TRANSVERSE COLON:  Normal.  HEPATIC FLEXURE:  Normal.  ASCENDING COLON:  Normal.  CECUM AND  ILEOCECAL VALVE:  Normal.    ASSESSMENT:  1. A 2.0 mm sessile polyp was removed from the proximal rectum.  2. A 0.5 mm sessile polyp was removed from the distal descending colon.                                               Danise Edge, M.D.    MJ/MEDQ  D:  12/11/2002  T:  12/11/2002  Job:  [id number]   cc:   Georgann Housekeeper, M.D.  [address]  [place]  [place] [address]  Fax: [phone]

## 2011-03-16 ENCOUNTER — Encounter: Payer: Self-pay | Admitting: Internal Medicine

## 2011-03-16 ENCOUNTER — Ambulatory Visit (INDEPENDENT_AMBULATORY_CARE_PROVIDER_SITE_OTHER): Payer: Medicare Other | Admitting: Internal Medicine

## 2011-03-16 ENCOUNTER — Encounter: Payer: Self-pay | Admitting: *Deleted

## 2011-03-16 DIAGNOSIS — I495 Sick sinus syndrome: Secondary | ICD-10-CM

## 2011-03-16 DIAGNOSIS — I10 Essential (primary) hypertension: Secondary | ICD-10-CM

## 2011-03-16 DIAGNOSIS — I4891 Unspecified atrial fibrillation: Secondary | ICD-10-CM

## 2011-03-16 LAB — CBC WITH DIFFERENTIAL/PLATELET
Basophils Absolute: 0 10*3/uL (ref 0.0–0.1)
Eosinophils Relative: 1.7 % (ref 0.0–5.0)
Lymphocytes Relative: 17.5 % (ref 12.0–46.0)
Lymphs Abs: 1.3 10*3/uL (ref 0.7–4.0)
Monocytes Relative: 9.3 % (ref 3.0–12.0)
Neutrophils Relative %: 71 % (ref 43.0–77.0)
Platelets: 173 10*3/uL (ref 150.0–400.0)
RDW: 15.1 % — ABNORMAL HIGH (ref 11.5–14.6)
WBC: 7.5 10*3/uL (ref 4.5–10.5)

## 2011-03-16 LAB — BASIC METABOLIC PANEL
BUN: 19 mg/dL (ref 6–23)
Calcium: 8.6 mg/dL (ref 8.4–10.5)
Creatinine, Ser: 1.3 mg/dL (ref 0.4–1.5)
GFR: 58.03 mL/min — ABNORMAL LOW (ref >60.00)
Glucose, Bld: 87 mg/dL (ref 70–99)

## 2011-03-16 NOTE — Progress Notes (Signed)
The patient presents today for routine electrophysiology followup.  Since last being seen in our clinic, he reports doing reasonably well. His HR remains 40s.  He reports some daytime somnolence and fatigue which continue to make him feel "bad".  Today, he denies symptoms of palpitations, chest pain, shortness of breath, orthopnea, PND, lower extremity edema, dizziness, presyncope, syncope, or new neurologic sequela.  The patient feels that he is tolerating medications without difficulties and is otherwise without complaint today.   Past Medical History  Diagnosis Date  . Prostate cancer 2006    s/p XRT  . CVA (cerebral infarction) 1999  . DDD (degenerative disc disease), cervical   . Memory impairment   . Persistent atrial fibrillation     and typical appearing atrial flutter  . Coronary artery disease     s/p 5v CABG 2002  . Depression   . GERD (gastroesophageal reflux disease)   . Hyperlipidemia   . Hypertension   . PVD (peripheral vascular disease)   . Tachycardia-bradycardia   . OSA (obstructive sleep apnea)     Recently diagnosed though pt. has not had CPAP trial   Past Surgical History  Procedure Date  . Coronary artery bypass graft 2002  . Cea 1999    Current Outpatient Prescriptions  Medication Sig Dispense Refill  . amiodarone (PACERONE) 200 MG tablet Take 200 mg by mouth daily.        Marland Kitchen aspirin 81 MG tablet Take 81 mg by mouth daily.        . dabigatran (PRADAXA) 150 MG CAPS Take 150 mg by mouth every 12 (twelve) hours.        Marland Kitchen ezetimibe-simvastatin (VYTORIN) 10-40 MG per tablet Take 1 tablet by mouth at bedtime.        Marland Kitchen FLUoxetine (PROZAC) 40 MG capsule Take 40 mg by mouth daily.        . furosemide (LASIX) 40 MG tablet Take 40 mg by mouth daily.        . Lutein 20 MG CAPS Take by mouth.        . metoprolol tartrate (LOPRESSOR) 25 MG tablet 1/2 tab po qd       . Multiple Vitamins-Minerals (MULTIVITAMIN WITH MINERALS) tablet Take 1 tablet by mouth daily.        Marland Kitchen  omeprazole (PRILOSEC OTC) 20 MG tablet Take 20 mg by mouth 2 (two) times daily.        . potassium chloride SA (K-DUR,KLOR-CON) 20 MEQ tablet Take 20 mEq by mouth 2 (two) times daily.        Marland Kitchen topiramate (TOPAMAX) 50 MG tablet Take 50 mg by mouth 2 (two) times daily.          Allergies  Allergen Reactions  . Ramipril     REACTION: cough    History   Social History  . Marital Status: Married    Spouse Name: N/A    Number of Children: N/A  . Years of Education: N/A   Occupational History  . Not on file.   Social History Main Topics  . Smoking status: Former Smoker    Quit date: 10/12/1997  . Smokeless tobacco: Not on file  . Alcohol Use: No  . Drug Use: No  . Sexually Active: Not on file   Other Topics Concern  . Not on file   Social History Narrative   Pt lives in [place] with spouse.  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    Family History  Problem Relation Age of Onset  . Ovarian cancer    . Pancreatic cancer    . Lung cancer      ROS-  All systems are reviewed and are negative except as outlined in the HPI above  Physical Exam: Filed Vitals:   03/16/11 1130  BP: 140/72  Pulse: 50  Resp: 14  Height: 5\' 9"  (1.753 m)  Weight: 230 lb (104.327 kg)    GEN- elderly male, flat affect, alert and oriented x 3 today.   Head- normocephalic, atraumatic Eyes-  Sclera clear, conjunctiva pink Ears- hearing intact Oropharynx- clear Neck- supple, no JVP Lymph- no cervical lymphadenopathy Lungs- Clear to ausculation bilaterally, normal work of breathing Heart- bradycardic rate and regular rhythm, no murmurs, rubs or gallops, PMI not laterally displaced GI- soft, NT, ND, + BS Extremities- no clubbing, cyanosis, trace edema MS- no significant deformity or atrophy Skin- no rash or lesion Psych- euthymic mood, flat affect  EKG today reveals sinus bradycardia 47 bpm, anterior infarct

## 2011-03-16 NOTE — Assessment & Plan Note (Signed)
Stable No change required today  

## 2011-03-16 NOTE — Assessment & Plan Note (Signed)
The patient has tachy/brady syndrome.  Pacemaker implantation would be a reasonable option.  I had a long discussion today with the patient and his family about possible PPM implantation.  The patient wishes to proceed with PPM at this time.  We will therefore scheduled PPM at the next available time.

## 2011-03-16 NOTE — Assessment & Plan Note (Signed)
Continue pradaxa and amiodarone

## 2011-03-16 NOTE — Patient Instructions (Signed)

## 2011-03-19 ENCOUNTER — Telehealth: Payer: Self-pay | Admitting: Internal Medicine

## 2011-03-19 NOTE — Telephone Encounter (Signed)
Spoke with the patients wife He has had a stroke and with Medicare Stroke does not cover the chair  So she already bought

## 2011-03-19 NOTE — Telephone Encounter (Signed)
Per pt wife calling this am .  Patient struggles to get up from his recliner.  Wife  wants to know can she get a lift chair in her home by Saturday. Procedure on 6/8 @ noon .

## 2011-03-20 ENCOUNTER — Ambulatory Visit (HOSPITAL_COMMUNITY)
Admission: RE | Admit: 2011-03-20 | Discharge: 2011-03-21 | Disposition: A | Discharge status: Home / Self Care | Payer: Medicare Other | Source: Ambulatory Visit | Attending: Internal Medicine | Admitting: Internal Medicine

## 2011-03-20 ENCOUNTER — Ambulatory Visit (HOSPITAL_COMMUNITY): Payer: Medicare Other

## 2011-03-20 DIAGNOSIS — I2510 Atherosclerotic heart disease of native coronary artery without angina pectoris: Secondary | ICD-10-CM | POA: Insufficient documentation

## 2011-03-20 DIAGNOSIS — I495 Sick sinus syndrome: Secondary | ICD-10-CM

## 2011-03-20 DIAGNOSIS — Z01818 Encounter for other preprocedural examination: Secondary | ICD-10-CM | POA: Insufficient documentation

## 2011-03-20 DIAGNOSIS — I4891 Unspecified atrial fibrillation: Secondary | ICD-10-CM | POA: Insufficient documentation

## 2011-03-20 DIAGNOSIS — Z8673 Personal history of transient ischemic attack (TIA), and cerebral infarction without residual deficits: Secondary | ICD-10-CM | POA: Insufficient documentation

## 2011-03-20 DIAGNOSIS — I10 Essential (primary) hypertension: Secondary | ICD-10-CM | POA: Insufficient documentation

## 2011-03-20 DIAGNOSIS — Z951 Presence of aortocoronary bypass graft: Secondary | ICD-10-CM | POA: Insufficient documentation

## 2011-03-20 DIAGNOSIS — E785 Hyperlipidemia, unspecified: Secondary | ICD-10-CM | POA: Insufficient documentation

## 2011-03-20 DIAGNOSIS — Z79899 Other long term (current) drug therapy: Secondary | ICD-10-CM | POA: Insufficient documentation

## 2011-03-20 LAB — SURGICAL PCR SCREEN
MRSA, PCR: NEGATIVE
Staphylococcus aureus: NEGATIVE

## 2011-03-20 LAB — BASIC METABOLIC PANEL
CO2: 26 mEq/L (ref 19–32)
Calcium: 9.1 mg/dL (ref 8.4–10.5)
Potassium: 4.2 mEq/L (ref 3.5–5.1)
Sodium: 144 mEq/L (ref 135–145)

## 2011-03-20 LAB — MAGNESIUM: Magnesium: 2.2 mg/dL (ref 1.5–2.5)

## 2011-03-21 ENCOUNTER — Ambulatory Visit (HOSPITAL_COMMUNITY): Payer: Medicare Other

## 2011-03-23 ENCOUNTER — Telehealth: Payer: Self-pay | Admitting: Internal Medicine

## 2011-03-23 NOTE — Telephone Encounter (Signed)
Dr Anne Fu to follow up as scheduled Potassium bid HHn aware

## 2011-03-23 NOTE — Op Note (Signed)
NAME:  Zachary Warren, Zachary Warren NO.:  [id number]  MEDICAL RECORD NO.:  [id number]  LOCATION:  2021                         FACILITY:  [facility]  PHYSICIAN:  Hillis Range, MD       DATE OF BIRTH:  March 31, 1933  DATE OF PROCEDURE: DATE OF DISCHARGE:                              OPERATIVE REPORT   SURGEON:  Hillis Range, MD  PREPROCEDURE DIAGNOSES: 1. Sinus node dysfunction with symptomatic bradycardia. 2. Persistent atrial fibrillation.  POSTPROCEDURE DIAGNOSES: 1. Sinus node dysfunction with symptomatic bradycardia. 2. Persistent atrial fibrillation.  PROCEDURE:  Pacemaker implantation.  INTRODUCTION:  [name] is a pleasant 77 year old gentleman with a history of sick sinus syndrome and symptomatic bradycardia who now presents for pacemaker implantation.  He has a history of atrial fibrillation for which he has previously been quite symptomatic and has required multiple cardioversions.  He has been maintained on amiodarone long-term.  No reversible causes for his bradycardia have been found and he requires beta-blocker therapy due to rapid ventricular rates with atrial fibrillation in the past.  He therefore presents today for pacemaker implantation.  DESCRIPTION OF PROCEDURE:  Informed written consent was obtained and the patient was brought to the Electrophysiology Lab in the fasting state. He received no sedation for the procedure today.  The patient's left chest was prepped and draped in the usual sterile fashion by the EP Lab staff.  The skin overlying the left deltopectoral region was infiltrated with lidocaine for local analgesia.  A 3-cm incision was made over the left deltopectoral region.  A left subcutaneous pacemaker pocket was fashioned using a combination of sharp and blunt dissection. Electrocautery was required to assure hemostasis.  The left axillary vein was cannulated with fluoroscopic visualization.  No contrast was required for this endeavor.   Through the left axillary vein, a Medtronic model 5076 - 52 (serial number [id number]) right atrial lead and a Medtronic model 5076 - 58 (serial number PJN [id number]) right ventricular lead were advanced with fluoroscopic visualization into the right atrial appendage and right ventricular apical septal positions respectively. Initial atrial lead P-waves measured 2.5 mV with an impedance of 870 ohms and a threshold of 1.5 volts at 0.5 milliseconds.  Right ventricular lead R-waves measured 9.3 mV with an impedance of 909 ohms and a threshold of 0.6 volts at 0.5 milliseconds.  Both leads were secured to the pectoralis fascia using #2 silk suture over the suture sleeves.  The leads were then connected to a Medtronic Adapta L model ADDRL1 (serial number [id number]) pacemaker.  The pocket was irrigated with copious gentamicin solution.  The pacemaker was then placed into the pocket.  The pocket was then closed in two layers with 2.0 Vicryl suture for the subcutaneous and subcuticular layers.  Steri-Strips and a sterile dressing were then applied.  There were no early apparent complications.  CONCLUSIONS: 1. Successful implantation of a Medtronic Adapta L, model ADDRL1     pacemaker for symptomatic bradycardia. 2. No early apparent complications.     Hillis Range, MD     JA/MEDQ  D:  03/20/2011  T:  03/21/2011  Job:  [id number]  cc:   Georgann Housekeeper, MD  Jake Bathe, MD  Electronically Signed by Hillis Range MD on 03/23/2011 09:20:44 AM

## 2011-03-23 NOTE — Discharge Summary (Signed)
  NAMEMarland Kitchen  DEMPSEY, AHONEN NO.:  [id number]  MEDICAL RECORD NO.:  [id number]  LOCATION:  2021                         FACILITY:  [facility]  PHYSICIAN:  Hillis Range, MD       DATE OF BIRTH:  1933/02/24  DATE OF ADMISSION:  03/20/2011 DATE OF DISCHARGE:  03/21/2011                              DISCHARGE SUMMARY   PRIMARY DIAGNOSIS:  Sick sinus syndrome with symptomatic bradycardia.  SECONDARY DIAGNOSES: 1. Persistent atrial fibrillation. 2. Hypertension. 3. Prior stroke. 4. Coronary artery disease status post coronary artery bypass graft in     2002. 5. Hyperlipidemia.  PROCEDURES:  Pacemaker implantation.  CONSULTATION:  None.  HISTORY OF PRESENT ILLNESS:  Ms. Lebleu is a very pleasant 77 year old gentleman with a history of persistent atrial fibrillation and tachycardia-bradycardia syndrome.  He initially was evaluated by Dr. Anne Fu and found to have symptomatic atrial fibrillation.  He had rapid ventricular rates and was placed on amiodarone and metoprolol.  He was appropriately anticoagulated with dabigatran.  He required multiple cardioversions to maintain sinus rhythm.  During sinus rhythm, he has had sick sinus syndrome with symptomatic bradycardia and heart rates in the 30s and 40s with significant fatigue and daytime somnolence.  He, therefore, presents today for pacemaker implantation.  HOSPITAL COURSE:  Informed written consent was obtained and the patient was brought to the electrophysiology lab where he underwent successful implantation of a dual-chamber pacemaker on March 20, 2011.  He had a Medtronic Adapta L model ADDRL1 (serial number [id number]) pacemaker implanted with a Medtronic model [id number] (serial number [id number]) right atrial lead and a Medtronic model [id number] [id number] (serial number PJN [id number]) right ventricular lead implanted in the right atrial appendage and right ventricular apical septal positions respectively.  He was observed  overnight without event.  By the time of discharge, the patient was alert, ambulatory, and otherwise without complaint.  DISPOSITION:  Home.  DISCHARGE CONDITION:  Good.  DISCHARGE DIET:  Cardiac prudent.  DISCHARGE INSTRUCTIONS:  He is instructed to keep his pacemaker site clean and dry for 10 days.  He is instructed to avoid excessive arm movements of the left arm for 6 weeks.  He will increase activities as tolerated.  DISCHARGE MEDICATIONS: 1. Amiodarone 200 mg daily. 2. Aspirin 81 mg daily. 3. Pradaxa 150 mg q.12 h. 4. Vytorin 10/40 mg at bedtime. 5. Prozac 40 mg daily. 6. Lasix 40 mg daily. 7. Lutein 20 mg daily. 8. Metoprolol 12.5 mg daily. 9. Multivitamin daily. 10.Prilosec 20 mg b.i.d. 11.Potassium chloride 20 mEq b.i.d. 12.Topamax 50 mg b.i.d.  FOLLOWUP:  The patient will be scheduled to have a wound check in the [facility] in 10 days.  He will follow up with Dr. Johney Frame once in 3 months.  He will then resume regular followup with Dr. Anne Fu as previously scheduled.     Hillis Range, MD     JA/MEDQ  D:  03/21/2011  T:  03/21/2011  Job:  [id number]  cc:   Georgann Housekeeper, MD Jake Bathe, MD  Electronically Signed by Hillis Range MD on 03/23/2011 09:20:47 AM

## 2011-03-23 NOTE — Telephone Encounter (Signed)
Per denise from [facility] pt had pacemaker inserted on 6/8  Potassium was increase on 6/5. Please clarify discharged paperwork re medication.

## 2011-03-30 ENCOUNTER — Ambulatory Visit (INDEPENDENT_AMBULATORY_CARE_PROVIDER_SITE_OTHER): Payer: Medicare Other | Admitting: *Deleted

## 2011-03-30 ENCOUNTER — Other Ambulatory Visit: Payer: Self-pay | Admitting: Internal Medicine

## 2011-03-30 DIAGNOSIS — I495 Sick sinus syndrome: Secondary | ICD-10-CM

## 2011-03-30 DIAGNOSIS — I4891 Unspecified atrial fibrillation: Secondary | ICD-10-CM

## 2011-03-30 NOTE — Progress Notes (Signed)
Pacer check in clinic  

## 2011-04-13 ENCOUNTER — Ambulatory Visit: Payer: Medicare Other | Admitting: *Deleted

## 2011-06-16 ENCOUNTER — Encounter: Payer: Self-pay | Admitting: Internal Medicine

## 2011-06-18 ENCOUNTER — Ambulatory Visit (INDEPENDENT_AMBULATORY_CARE_PROVIDER_SITE_OTHER): Payer: Medicare Other | Admitting: Internal Medicine

## 2011-06-18 ENCOUNTER — Encounter: Payer: Self-pay | Admitting: Internal Medicine

## 2011-06-18 DIAGNOSIS — I4891 Unspecified atrial fibrillation: Secondary | ICD-10-CM

## 2011-06-18 DIAGNOSIS — I495 Sick sinus syndrome: Secondary | ICD-10-CM

## 2011-06-18 LAB — PACEMAKER DEVICE OBSERVATION
AL THRESHOLD: 0.5 V
BAMS-0001: 175 {beats}/min
BATTERY VOLTAGE: 2.79 V
RV LEAD AMPLITUDE: 22.4 mv
RV LEAD IMPEDENCE PM: 489 Ohm
VENTRICULAR PACING PM: 0

## 2011-06-18 NOTE — Progress Notes (Signed)
The patient presents today for routine electrophysiology followup.  Since having his pacemaker implanted, the patient reports doing very well.  His energy and SOB have significantly improved since having the pacemaker implanted.  His spouse notes that he is now more active. Today, he denies symptoms of palpitations, chest pain, shortness of breath, orthopnea, PND, lower extremity edema, dizziness, presyncope, syncope, or neurologic sequela.  The patient feels that he is tolerating medications without difficulties and is otherwise without complaint today.   Past Medical History  Diagnosis Date  . Prostate cancer 2006    s/p XRT  . CVA (cerebral infarction) 1999  . DDD (degenerative disc disease), cervical   . Memory impairment   . Persistent atrial fibrillation     on pradaxa  . Coronary artery disease     s/p 5v CABG 2002  . Depression   . GERD (gastroesophageal reflux disease)   . Hyperlipidemia   . Hypertension   . PVD (peripheral vascular disease)   . 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 OSA (obstructive sleep apnea)     Recently diagnosed though pt. has not had CPAP trial   Past Surgical History  Procedure Date  . Coronary artery bypass graft 2002  . Cea 1999  . Pacemaker insertion     implanted by [facility] 6/12    Current Outpatient Prescriptions  Medication Sig Dispense Refill  . amiodarone (PACERONE) 200 MG tablet Take 200 mg by mouth daily.        Marland Kitchen aspirin 81 MG tablet Take 81 mg by mouth daily.        . dabigatran (PRADAXA) 150 MG CAPS Take 150 mg by mouth every 12 (twelve) hours.        Marland Kitchen FLUoxetine (PROZAC) 40 MG capsule Take 40 mg by mouth daily.        . furosemide (LASIX) 40 MG tablet Take 40 mg by mouth daily.        . Lutein 20 MG CAPS Take by mouth.        . metoprolol succinate (TOPROL-XL) 25 MG 24 hr tablet Take 0.5 tablets by mouth Daily.      . Multiple Vitamins-Minerals (MULTIVITAMIN WITH MINERALS) tablet Take 1 tablet by mouth daily.        Marland Kitchen  omeprazole (PRILOSEC OTC) 20 MG tablet Take 20 mg by mouth 2 (two) times daily.        . potassium chloride SA (K-DUR,KLOR-CON) 20 MEQ tablet Take 20 mEq by mouth 2 (two) times daily.        . simvastatin (ZOCOR) 10 MG tablet Take 10 mg by mouth at bedtime.        . topiramate (TOPAMAX) 50 MG tablet Take 50 mg by mouth 2 (two) times daily.          Allergies  Allergen Reactions  . Ramipril     REACTION: cough    History   Social History  . Marital Status: Married    Spouse Name: N/A    Number of Children: N/A  . Years of Education: N/A   Occupational History  . Not on file.   Social History Main Topics  . Smoking status: Former Smoker -- 2.0 packs/day for 48 years    Types: Cigarettes    Quit date: 03/09/1998  . Smokeless tobacco: Never Used  . Alcohol Use: No  . Drug Use: No  . Sexually Active: Not on file   Other Topics Concern  .  Not on file   Social History Narrative   Pt lives in [place] with spouse.  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    Family History  Problem Relation Age of Onset  . Ovarian cancer    . Pancreatic cancer    . Lung cancer      Physical Exam: Filed Vitals:   06/18/11 1104  BP: 101/65  Pulse: 60  Height: 5\' 8"  (1.727 m)  Weight: 231 lb (104.781 kg)    GEN- The patient is well appearing, alert and oriented x 3 today.   Head- normocephalic, atraumatic Eyes-  Sclera clear, conjunctiva pink Ears- hearing intact Oropharynx- clear Neck- supple, no JVP Lymph- no cervical lymphadenopathy Lungs- Clear to ausculation bilaterally, normal work of breathing Chest- pacemaker pocket is well healed Heart- Regular rate and rhythm, no murmurs, rubs or gallops, PMI not laterally displaced GI- soft, NT, ND, + BS Extremities- no clubbing, cyanosis, or edema  Pacemaker interrogation- reviewed in detail today,  See PACEART report  Assessment and Plan:

## 2011-06-18 NOTE — Assessment & Plan Note (Signed)
Normal pacemaker function See Arita Miss Art report No changes today  The patient requests to have his pacemaker followed in our clinic in [place] which is closer than [place].  I am OK with this decision.  I have encouraged the patient to keep regular follow-up with Dr Anne Fu for routine cardiology care.

## 2011-06-18 NOTE — Assessment & Plan Note (Signed)
Stable Continue pradaxa Dr Anne Fu to follow [facility] Stop ASA if ok with neurology Continue amiodarone TFTs/LFTs per Dr Anne Fu

## 2011-11-11 DIAGNOSIS — R159 Full incontinence of feces: Secondary | ICD-10-CM | POA: Diagnosis not present

## 2011-11-11 DIAGNOSIS — K649 Unspecified hemorrhoids: Secondary | ICD-10-CM | POA: Diagnosis not present

## 2012-01-22 DIAGNOSIS — I10 Essential (primary) hypertension: Secondary | ICD-10-CM | POA: Diagnosis not present

## 2012-01-22 DIAGNOSIS — I6990 Unspecified sequelae of unspecified cerebrovascular disease: Secondary | ICD-10-CM | POA: Diagnosis not present

## 2012-01-22 DIAGNOSIS — K219 Gastro-esophageal reflux disease without esophagitis: Secondary | ICD-10-CM | POA: Diagnosis not present

## 2012-01-22 DIAGNOSIS — C61 Malignant neoplasm of prostate: Secondary | ICD-10-CM | POA: Diagnosis not present

## 2012-01-22 DIAGNOSIS — E782 Mixed hyperlipidemia: Secondary | ICD-10-CM | POA: Diagnosis not present

## 2012-01-22 DIAGNOSIS — F329 Major depressive disorder, single episode, unspecified: Secondary | ICD-10-CM | POA: Diagnosis not present

## 2012-01-22 DIAGNOSIS — I5031 Acute diastolic (congestive) heart failure: Secondary | ICD-10-CM | POA: Diagnosis not present

## 2012-01-29 DIAGNOSIS — I4891 Unspecified atrial fibrillation: Secondary | ICD-10-CM | POA: Diagnosis not present

## 2012-01-29 DIAGNOSIS — I2510 Atherosclerotic heart disease of native coronary artery without angina pectoris: Secondary | ICD-10-CM | POA: Diagnosis not present

## 2012-01-29 DIAGNOSIS — I10 Essential (primary) hypertension: Secondary | ICD-10-CM | POA: Diagnosis not present

## 2012-01-29 DIAGNOSIS — E782 Mixed hyperlipidemia: Secondary | ICD-10-CM | POA: Diagnosis not present

## 2012-02-04 ENCOUNTER — Encounter (HOSPITAL_COMMUNITY): Payer: Self-pay | Admitting: Emergency Medicine

## 2012-02-04 ENCOUNTER — Emergency Department (HOSPITAL_COMMUNITY): Payer: Medicare Other

## 2012-02-04 ENCOUNTER — Inpatient Hospital Stay (HOSPITAL_COMMUNITY)
Admission: EM | Admit: 2012-02-04 | Discharge: 2012-02-06 | DRG: 871 | Disposition: A | Discharge status: Home Health | Payer: Medicare Other | Attending: Internal Medicine | Admitting: Internal Medicine

## 2012-02-04 DIAGNOSIS — I69998 Other sequelae following unspecified cerebrovascular disease: Secondary | ICD-10-CM | POA: Diagnosis not present

## 2012-02-04 DIAGNOSIS — G319 Degenerative disease of nervous system, unspecified: Secondary | ICD-10-CM | POA: Diagnosis not present

## 2012-02-04 DIAGNOSIS — R509 Fever, unspecified: Secondary | ICD-10-CM

## 2012-02-04 DIAGNOSIS — R4182 Altered mental status, unspecified: Secondary | ICD-10-CM

## 2012-02-04 DIAGNOSIS — Z951 Presence of aortocoronary bypass graft: Secondary | ICD-10-CM

## 2012-02-04 DIAGNOSIS — I639 Cerebral infarction, unspecified: Secondary | ICD-10-CM | POA: Diagnosis present

## 2012-02-04 DIAGNOSIS — E785 Hyperlipidemia, unspecified: Secondary | ICD-10-CM | POA: Diagnosis present

## 2012-02-04 DIAGNOSIS — I517 Cardiomegaly: Secondary | ICD-10-CM | POA: Diagnosis not present

## 2012-02-04 DIAGNOSIS — R05 Cough: Secondary | ICD-10-CM | POA: Diagnosis not present

## 2012-02-04 DIAGNOSIS — R0902 Hypoxemia: Secondary | ICD-10-CM

## 2012-02-04 DIAGNOSIS — I69391 Dysphagia following cerebral infarction: Secondary | ICD-10-CM

## 2012-02-04 DIAGNOSIS — I10 Essential (primary) hypertension: Secondary | ICD-10-CM | POA: Diagnosis not present

## 2012-02-04 DIAGNOSIS — Z87891 Personal history of nicotine dependence: Secondary | ICD-10-CM

## 2012-02-04 DIAGNOSIS — R059 Cough, unspecified: Secondary | ICD-10-CM | POA: Diagnosis not present

## 2012-02-04 DIAGNOSIS — I69991 Dysphagia following unspecified cerebrovascular disease: Secondary | ICD-10-CM

## 2012-02-04 DIAGNOSIS — I2510 Atherosclerotic heart disease of native coronary artery without angina pectoris: Secondary | ICD-10-CM | POA: Diagnosis present

## 2012-02-04 DIAGNOSIS — Z950 Presence of cardiac pacemaker: Secondary | ICD-10-CM

## 2012-02-04 DIAGNOSIS — R6510 Systemic inflammatory response syndrome (SIRS) of non-infectious origin without acute organ dysfunction: Principal | ICD-10-CM | POA: Diagnosis present

## 2012-02-04 DIAGNOSIS — G9340 Encephalopathy, unspecified: Secondary | ICD-10-CM | POA: Diagnosis present

## 2012-02-04 DIAGNOSIS — K219 Gastro-esophageal reflux disease without esophagitis: Secondary | ICD-10-CM | POA: Diagnosis present

## 2012-02-04 DIAGNOSIS — I4891 Unspecified atrial fibrillation: Secondary | ICD-10-CM | POA: Diagnosis present

## 2012-02-04 DIAGNOSIS — R1310 Dysphagia, unspecified: Secondary | ICD-10-CM | POA: Diagnosis present

## 2012-02-04 DIAGNOSIS — R569 Unspecified convulsions: Secondary | ICD-10-CM | POA: Diagnosis present

## 2012-02-04 HISTORY — DX: Unspecified convulsions: R56.9

## 2012-02-04 LAB — DIFFERENTIAL
Basophils Absolute: 0 10*3/uL (ref 0.0–0.1)
Basophils Relative: 0 % (ref 0–1)
Eosinophils Absolute: 0.1 10*3/uL (ref 0.0–0.7)
Eosinophils Relative: 0 % (ref 0–5)
Monocytes Absolute: 1 10*3/uL (ref 0.1–1.0)

## 2012-02-04 LAB — PROCALCITONIN: Procalcitonin: 0.12 ng/mL

## 2012-02-04 LAB — POCT I-STAT, CHEM 8
Calcium, Ion: 1.17 mmol/L (ref 1.12–1.32)
HCT: 45 % (ref 39.0–52.0)
Hemoglobin: 15.3 g/dL (ref 13.0–17.0)
TCO2: 27 mmol/L (ref 0–100)

## 2012-02-04 LAB — CBC
HCT: 44.7 % (ref 39.0–52.0)
MCHC: 32.4 g/dL (ref 30.0–36.0)
MCV: 95.3 fL (ref 78.0–100.0)
RDW: 13.8 % (ref 11.5–15.5)

## 2012-02-04 LAB — POCT I-STAT TROPONIN I: Troponin i, poc: 0.02 ng/mL (ref 0.00–0.08)

## 2012-02-04 LAB — COMPREHENSIVE METABOLIC PANEL
ALT: 15 U/L (ref 0–53)
Calcium: 9.1 mg/dL (ref 8.4–10.5)
GFR calc Af Amer: 51 mL/min — ABNORMAL LOW (ref >90)
Glucose, Bld: 132 mg/dL — ABNORMAL HIGH (ref 70–99)
Sodium: 143 mEq/L (ref 135–145)
Total Protein: 6.9 g/dL (ref 6.0–8.3)

## 2012-02-04 LAB — PROTIME-INR: Prothrombin Time: 13.8 seconds (ref 11.6–15.2)

## 2012-02-04 LAB — POCT I-STAT 3, VENOUS BLOOD GAS (G3P V)

## 2012-02-04 LAB — LACTIC ACID, PLASMA: Lactic Acid, Venous: 1.5 mmol/L (ref 0.5–2.2)

## 2012-02-04 MED ORDER — CEFTRIAXONE SODIUM 1 G IJ SOLR
1.0000 g | Freq: Once | INTRAMUSCULAR | Status: AC
  Administered 2012-02-05: 1 g via INTRAVENOUS
  Filled 2012-02-04: qty 10

## 2012-02-04 MED ORDER — SODIUM CHLORIDE 0.9 % IV BOLUS (SEPSIS)
1000.0000 mL | Freq: Once | INTRAVENOUS | Status: AC
  Administered 2012-02-04: 1000 mL via INTRAVENOUS

## 2012-02-04 MED ORDER — DEXTROSE 5 % IV SOLN
500.0000 mg | Freq: Once | INTRAVENOUS | Status: AC
  Administered 2012-02-05: 500 mg via INTRAVENOUS
  Filled 2012-02-04: qty 500

## 2012-02-04 MED ORDER — ACETAMINOPHEN 325 MG PO TABS
650.0000 mg | ORAL_TABLET | Freq: Once | ORAL | Status: AC
  Administered 2012-02-04: 650 mg via ORAL
  Filled 2012-02-04: qty 2

## 2012-02-04 NOTE — ED Notes (Addendum)
PT reported he had an episode where his whole body shook and he felt really cold all over. Pt is febrile and has a cough. Breath sounds are course crackles. Pt had a stroke in 1999 and has hx aphasia. PT placed on 2 L [place].

## 2012-02-04 NOTE — ED Provider Notes (Signed)
History     CSN: [id number]  Arrival date & time 02/04/12  2139   First MD Initiated Contact with Patient 02/04/12 2212      Chief Complaint  Patient presents with  . Fever    (Consider location/radiation/quality/duration/timing/severity/associated sxs/prior treatment) HPI Comments: Patient presents from home with episode of full body shaking that lasted 15 minutes and he maintained consciousness and was talking to his wife during it. He does have a history of seizures for which he takes Topamax. Patient's wife reports he's had increasing confusion over the past day. Suddenly febrile in the ED had a moist productive cough. Wife states he's had cough for greater than 20 years. Patient denies chest pain, abdominal pain, headache or shortness of breath.  The history is provided by the patient and the spouse. The history is limited by the condition of the patient.    Past Medical History  Diagnosis Date  . Prostate cancer 2006    s/p XRT  . CVA (cerebral infarction) 1999  . DDD (degenerative disc disease), cervical   . Memory impairment   . Persistent atrial fibrillation     on pradaxa  . Coronary artery disease     s/p 5v CABG 2002  . Depression   . GERD (gastroesophageal reflux disease)   . Hyperlipidemia   . Hypertension   . PVD (peripheral vascular disease)   . 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 OSA (obstructive sleep apnea)     Recently diagnosed though pt. has not had CPAP trial  . Seizures   . Pacemaker     Past Surgical History  Procedure Date  . Coronary artery bypass graft 2002  . Cea 1999  . Pacemaker insertion     implanted by JA 6/12    Family History  Problem Relation Age of Onset  . Ovarian cancer    . Pancreatic cancer    . Lung cancer      History  Substance Use Topics  . Smoking status: Former Smoker -- 2.0 packs/day for 48 years    Types: Cigarettes    Quit date: 03/09/1998  . Smokeless tobacco: Never Used  . Alcohol Use:  No      Review of Systems  Unable to perform ROS: Mental status change  Constitutional: Positive for fever.    Allergies  Ramipril  Home Medications   Current Outpatient Rx  Name Route Sig Dispense Refill  . AMIODARONE HCL 200 MG PO TABS Oral Take 200 mg by mouth daily.      . ATORVASTATIN CALCIUM 40 MG PO TABS Oral Take 40 mg by mouth daily.    Marland Kitchen DABIGATRAN ETEXILATE MESYLATE 150 MG PO CAPS Oral Take 150 mg by mouth every 12 (twelve) hours.      Marland Kitchen FLUOXETINE HCL 40 MG PO CAPS Oral Take 40 mg by mouth daily.      . FUROSEMIDE 40 MG PO TABS Oral Take 40 mg by mouth daily.      . LUTEIN 20 MG PO CAPS Oral Take by mouth.      . METOPROLOL SUCCINATE ER 25 MG PO TB24 Oral Take 0.5 tablets by mouth Daily.    . MULTI-VITAMIN/MINERALS PO TABS Oral Take 1 tablet by mouth daily. Chewable flinstones    . OMEPRAZOLE MAGNESIUM 20 MG PO TBEC Oral Take 20 mg by mouth 2 (two) times daily.      Marland Kitchen POTASSIUM CHLORIDE CRYS ER 20 MEQ PO TBCR Oral Take  20 mEq by mouth 2 (two) times daily.      . TOPIRAMATE 50 MG PO TABS Oral Take 50 mg by mouth 2 (two) times daily.        BP 153/67  Pulse 78  Temp(Src) 101.2 F (38.4 C) (Oral)  Resp 22  SpO2 89%  Physical Exam  Constitutional: No distress.       Uncomfortable, confused, shaking chills  HENT:  Head: Normocephalic and atraumatic.  Mouth/Throat: Oropharynx is clear and moist. No oropharyngeal exudate.  Eyes: Conjunctivae and EOM are normal. Pupils are equal, round, and reactive to light.  Neck: Normal range of motion. Neck supple.       No meningismus  Cardiovascular: Normal rate, regular rhythm and normal heart sounds.   No murmur heard. Pulmonary/Chest: Effort normal and breath sounds normal. No respiratory distress.       Diffuse rhonchi bilaterally  Abdominal: Soft. There is no tenderness. There is no rebound and no guarding.  Musculoskeletal: He exhibits no edema and no tenderness.  Neurological: He is alert. No cranial nerve deficit.         Confused, word finding difficulty, follows commands. Called his wife by the wrong name  Skin: Skin is warm.    ED Course  LUMBAR PUNCTURE Date/Time: 02/05/2012 12:22 AM Performed by: Glynn Octave Authorized by: Glynn Octave Consent: Verbal consent obtained. Risks and benefits: risks, benefits and alternatives were discussed Consent given by: patient and spouse Patient understanding: patient states understanding of the procedure being performed Patient consent: the patient's understanding of the procedure matches consent given Procedure consent: procedure consent matches procedure scheduled Site marked: the operative site was marked Patient identity confirmed: verbally with patient and arm band Time out: Immediately prior to procedure a "time out" was called to verify the correct patient, procedure, equipment, support staff and site/side marked as required. Indications: evaluation for infection and evaluation for altered mental status Anesthesia: local infiltration Local anesthetic: lidocaine 1% without epinephrine Anesthetic total: 4 ml Preparation: Patient was prepped and draped in the usual sterile fashion. Lumbar space: L3-L4 interspace Patient's position: right lateral decubitus Needle gauge: 20 Needle type: spinal needle - Quincke tip Needle length: 2.5 in Number of attempts: 1 Fluid appearance: clear Tubes of fluid: 4 Total volume: 8 ml Post-procedure: site cleaned and adhesive bandage applied Patient tolerance: Patient tolerated the procedure well with no immediate complications.   (including critical care time)  Labs Reviewed  CBC - Abnormal; Notable for the following:    WBC 14.0 (*)    All other components within normal limits  DIFFERENTIAL - Abnormal; Notable for the following:    Neutrophils Relative 87 (*)    Neutro Abs 12.2 (*)    Lymphocytes Relative 5 (*)    All other components within normal limits  POCT I-STAT, CHEM 8 - Abnormal; Notable  for the following:    Sodium 146 (*)    Glucose, Bld 140 (*)    All other components within normal limits  CULTURE, BLOOD (ROUTINE X 2)  CULTURE, BLOOD (ROUTINE X 2)  LACTIC ACID, PLASMA  COMPREHENSIVE METABOLIC PANEL  PROTIME-INR  URINALYSIS, ROUTINE W REFLEX MICROSCOPIC  PROCALCITONIN   No results found.   No diagnosis found.    MDM  Patient presents with fever, shaking chills, altered mental status and moist cough. Hypoxic in triage. Confused with word finding difficulties and slow to respond. Protecting airway, no apparent neuro deficits.  Mental status improving per wife.  Patient states he feels "fine".  Moist cough, fever, upper airway congestion persist.  Chest x-ray with vascular congestion but no obvious infiltrate. No source of infection identified and urinalysis either. With leukocytosis, fever and altered mental status proceeded with lumbar puncture.   Broad spectrum antibiotics initiated for possible pneumonia, possible meningitis. SIRS without clear source.  Possible viral syndrome.  Admission discussed with Dr. Joneen Roach.   Date: 02/04/2012  Rate: 66  Rhythm: normal sinus rhythm  QRS Axis: normal  Intervals: PR prolonged  ST/T Wave abnormalities: ST depressions laterally  Conduction Disutrbances:none  Narrative Interpretation: Stable inferior lateral ST depressions  Old EKG Reviewed: unchanged  CRITICAL CARE Performed by: Glynn Octave   Total critical care time: 40  Critical care time was exclusive of separately billable procedures and treating other patients.  Critical care was necessary to treat or prevent imminent or life-threatening deterioration.  Critical care was time spent personally by me on the following activities: development of treatment plan with patient and/or surrogate as well as nursing, discussions with consultants, evaluation of patient's response to treatment, examination of patient, obtaining history from patient or surrogate,  ordering and performing treatments and interventions, ordering and review of laboratory studies, ordering and review of radiographic studies, pulse oximetry and re-evaluation of patient's condition.        Glynn Octave, MD 02/05/12 1029

## 2012-02-05 ENCOUNTER — Inpatient Hospital Stay (HOSPITAL_COMMUNITY): Payer: Medicare Other

## 2012-02-05 ENCOUNTER — Encounter (HOSPITAL_COMMUNITY): Payer: Self-pay | Admitting: Family Medicine

## 2012-02-05 DIAGNOSIS — K219 Gastro-esophageal reflux disease without esophagitis: Secondary | ICD-10-CM | POA: Diagnosis present

## 2012-02-05 DIAGNOSIS — Z87891 Personal history of nicotine dependence: Secondary | ICD-10-CM | POA: Diagnosis not present

## 2012-02-05 DIAGNOSIS — R4182 Altered mental status, unspecified: Secondary | ICD-10-CM | POA: Diagnosis not present

## 2012-02-05 DIAGNOSIS — R6510 Systemic inflammatory response syndrome (SIRS) of non-infectious origin without acute organ dysfunction: Secondary | ICD-10-CM | POA: Diagnosis not present

## 2012-02-05 DIAGNOSIS — R569 Unspecified convulsions: Secondary | ICD-10-CM | POA: Diagnosis not present

## 2012-02-05 DIAGNOSIS — R509 Fever, unspecified: Secondary | ICD-10-CM | POA: Diagnosis not present

## 2012-02-05 DIAGNOSIS — E7800 Pure hypercholesterolemia, unspecified: Secondary | ICD-10-CM | POA: Diagnosis not present

## 2012-02-05 DIAGNOSIS — R0902 Hypoxemia: Secondary | ICD-10-CM | POA: Diagnosis not present

## 2012-02-05 DIAGNOSIS — R1310 Dysphagia, unspecified: Secondary | ICD-10-CM | POA: Diagnosis present

## 2012-02-05 DIAGNOSIS — G9340 Encephalopathy, unspecified: Secondary | ICD-10-CM | POA: Diagnosis not present

## 2012-02-05 DIAGNOSIS — I2510 Atherosclerotic heart disease of native coronary artery without angina pectoris: Secondary | ICD-10-CM | POA: Diagnosis not present

## 2012-02-05 DIAGNOSIS — I517 Cardiomegaly: Secondary | ICD-10-CM | POA: Diagnosis not present

## 2012-02-05 DIAGNOSIS — R05 Cough: Secondary | ICD-10-CM | POA: Diagnosis not present

## 2012-02-05 DIAGNOSIS — I10 Essential (primary) hypertension: Secondary | ICD-10-CM | POA: Diagnosis not present

## 2012-02-05 DIAGNOSIS — I4891 Unspecified atrial fibrillation: Secondary | ICD-10-CM | POA: Diagnosis not present

## 2012-02-05 DIAGNOSIS — I69998 Other sequelae following unspecified cerebrovascular disease: Secondary | ICD-10-CM | POA: Diagnosis not present

## 2012-02-05 DIAGNOSIS — Z951 Presence of aortocoronary bypass graft: Secondary | ICD-10-CM | POA: Diagnosis not present

## 2012-02-05 DIAGNOSIS — J984 Other disorders of lung: Secondary | ICD-10-CM | POA: Diagnosis not present

## 2012-02-05 DIAGNOSIS — I69921 Dysphasia following unspecified cerebrovascular disease: Secondary | ICD-10-CM | POA: Diagnosis not present

## 2012-02-05 DIAGNOSIS — E785 Hyperlipidemia, unspecified: Secondary | ICD-10-CM | POA: Diagnosis present

## 2012-02-05 DIAGNOSIS — R197 Diarrhea, unspecified: Secondary | ICD-10-CM | POA: Diagnosis not present

## 2012-02-05 DIAGNOSIS — I639 Cerebral infarction, unspecified: Secondary | ICD-10-CM | POA: Diagnosis present

## 2012-02-05 DIAGNOSIS — I69391 Dysphagia following cerebral infarction: Secondary | ICD-10-CM

## 2012-02-05 DIAGNOSIS — I69991 Dysphagia following unspecified cerebrovascular disease: Secondary | ICD-10-CM | POA: Diagnosis not present

## 2012-02-05 DIAGNOSIS — G319 Degenerative disease of nervous system, unspecified: Secondary | ICD-10-CM | POA: Diagnosis not present

## 2012-02-05 DIAGNOSIS — Z950 Presence of cardiac pacemaker: Secondary | ICD-10-CM | POA: Diagnosis not present

## 2012-02-05 DIAGNOSIS — I25810 Atherosclerosis of coronary artery bypass graft(s) without angina pectoris: Secondary | ICD-10-CM | POA: Diagnosis not present

## 2012-02-05 LAB — MRSA PCR SCREENING: MRSA by PCR: NEGATIVE

## 2012-02-05 LAB — BASIC METABOLIC PANEL
CO2: 24 mEq/L (ref 19–32)
Calcium: 8.4 mg/dL (ref 8.4–10.5)
Chloride: 109 mEq/L (ref 96–112)
Creatinine, Ser: 1.44 mg/dL — ABNORMAL HIGH (ref 0.50–1.35)
Glucose, Bld: 153 mg/dL — ABNORMAL HIGH (ref 70–99)
Sodium: 142 mEq/L (ref 135–145)

## 2012-02-05 LAB — GRAM STAIN

## 2012-02-05 LAB — CSF CELL COUNT WITH DIFFERENTIAL: WBC, CSF: 2 /mm3 (ref 0–5)

## 2012-02-05 LAB — CBC
HCT: 40.2 % (ref 39.0–52.0)
MCH: 30.3 pg (ref 26.0–34.0)
MCV: 96.6 fL (ref 78.0–100.0)
RBC: 4.16 MIL/uL — ABNORMAL LOW (ref 4.22–5.81)
WBC: 21.1 10*3/uL — ABNORMAL HIGH (ref 4.0–10.5)

## 2012-02-05 LAB — PRO B NATRIURETIC PEPTIDE: Pro B Natriuretic peptide (BNP): 665.7 pg/mL — ABNORMAL HIGH (ref 0–450)

## 2012-02-05 LAB — URINALYSIS, ROUTINE W REFLEX MICROSCOPIC
Glucose, UA: NEGATIVE mg/dL
Leukocytes, UA: NEGATIVE
Nitrite: NEGATIVE
Protein, ur: NEGATIVE mg/dL
Urobilinogen, UA: 1 mg/dL (ref 0.0–1.0)

## 2012-02-05 MED ORDER — DABIGATRAN ETEXILATE MESYLATE 150 MG PO CAPS
150.0000 mg | ORAL_CAPSULE | Freq: Two times a day (BID) | ORAL | Status: DC
  Administered 2012-02-05 (×2): 150 mg via ORAL
  Filled 2012-02-05 (×4): qty 1

## 2012-02-05 MED ORDER — ACETAMINOPHEN 325 MG PO TABS: 650.0000 mg | ORAL_TABLET | Freq: Four times a day (QID) | ORAL | Status: DC | PRN

## 2012-02-05 MED ORDER — SODIUM CHLORIDE 0.9 % IJ SOLN: 3.0000 mL | INTRAMUSCULAR | Status: DC | PRN

## 2012-02-05 MED ORDER — FLUOXETINE HCL 20 MG PO CAPS
40.0000 mg | ORAL_CAPSULE | Freq: Every day | ORAL | Status: DC
  Administered 2012-02-05: 40 mg via ORAL
  Filled 2012-02-05 (×2): qty 2

## 2012-02-05 MED ORDER — SODIUM CHLORIDE 0.9 % IJ SOLN
3.0000 mL | Freq: Two times a day (BID) | INTRAMUSCULAR | Status: DC
  Administered 2012-02-05 (×2): 3 mL via INTRAVENOUS

## 2012-02-05 MED ORDER — IPRATROPIUM BROMIDE 0.02 % IN SOLN: 0.5000 mg | RESPIRATORY_TRACT | Status: DC | PRN

## 2012-02-05 MED ORDER — BENZONATATE 100 MG PO CAPS
100.0000 mg | ORAL_CAPSULE | Freq: Three times a day (TID) | ORAL | Status: DC | PRN
  Filled 2012-02-05: qty 1

## 2012-02-05 MED ORDER — ONDANSETRON HCL 4 MG/2ML IJ SOLN: 4.0000 mg | Freq: Four times a day (QID) | INTRAMUSCULAR | Status: DC | PRN

## 2012-02-05 MED ORDER — AMIODARONE HCL 200 MG PO TABS
200.0000 mg | ORAL_TABLET | Freq: Every day | ORAL | Status: DC
  Administered 2012-02-05: 200 mg via ORAL
  Filled 2012-02-05 (×2): qty 1

## 2012-02-05 MED ORDER — OMEPRAZOLE MAGNESIUM 20 MG PO TBEC: 20.0000 mg | DELAYED_RELEASE_TABLET | Freq: Two times a day (BID) | ORAL | Status: DC

## 2012-02-05 MED ORDER — ATORVASTATIN CALCIUM 40 MG PO TABS
40.0000 mg | ORAL_TABLET | Freq: Every day | ORAL | Status: DC
  Administered 2012-02-05: 40 mg via ORAL
  Filled 2012-02-05 (×2): qty 1

## 2012-02-05 MED ORDER — SODIUM CHLORIDE 0.45 % IV SOLN
INTRAVENOUS | Status: AC
  Administered 2012-02-05 (×2): via INTRAVENOUS

## 2012-02-05 MED ORDER — TOPIRAMATE 25 MG PO TABS
50.0000 mg | ORAL_TABLET | Freq: Two times a day (BID) | ORAL | Status: DC
  Administered 2012-02-05 (×3): 50 mg via ORAL
  Filled 2012-02-05 (×5): qty 2

## 2012-02-05 MED ORDER — VANCOMYCIN HCL IN DEXTROSE 1-5 GM/200ML-% IV SOLN
1000.0000 mg | Freq: Once | INTRAVENOUS | Status: AC
  Administered 2012-02-05: 1000 mg via INTRAVENOUS
  Filled 2012-02-05: qty 200

## 2012-02-05 MED ORDER — SODIUM CHLORIDE 0.9 % IV SOLN: INTRAVENOUS | Status: DC

## 2012-02-05 MED ORDER — DEXTROSE 5 % IV SOLN
2.0000 g | Freq: Two times a day (BID) | INTRAVENOUS | Status: DC
  Administered 2012-02-05 (×2): 2 g via INTRAVENOUS
  Filled 2012-02-05 (×5): qty 2

## 2012-02-05 MED ORDER — ONDANSETRON HCL 4 MG PO TABS: 4.0000 mg | ORAL_TABLET | Freq: Four times a day (QID) | ORAL | Status: DC | PRN

## 2012-02-05 MED ORDER — PANTOPRAZOLE SODIUM 40 MG PO TBEC
40.0000 mg | DELAYED_RELEASE_TABLET | Freq: Every day | ORAL | Status: DC
  Administered 2012-02-05: 40 mg via ORAL
  Filled 2012-02-05: qty 1

## 2012-02-05 MED ORDER — VANCOMYCIN HCL 1000 MG IV SOLR
750.0000 mg | Freq: Two times a day (BID) | INTRAVENOUS | Status: DC
  Administered 2012-02-05 – 2012-02-06 (×2): 750 mg via INTRAVENOUS
  Filled 2012-02-05 (×4): qty 750

## 2012-02-05 MED ORDER — ACETAMINOPHEN 650 MG RE SUPP: 650.0000 mg | Freq: Four times a day (QID) | RECTAL | Status: DC | PRN

## 2012-02-05 MED ORDER — POTASSIUM CHLORIDE CRYS ER 20 MEQ PO TBCR
20.0000 meq | EXTENDED_RELEASE_TABLET | Freq: Two times a day (BID) | ORAL | Status: DC
  Administered 2012-02-05 (×3): 20 meq via ORAL
  Filled 2012-02-05 (×5): qty 1

## 2012-02-05 MED ORDER — ALBUTEROL SULFATE (5 MG/ML) 0.5% IN NEBU: 2.5000 mg | INHALATION_SOLUTION | RESPIRATORY_TRACT | Status: DC | PRN

## 2012-02-05 MED ORDER — ALUM & MAG HYDROXIDE-SIMETH 200-200-20 MG/5ML PO SUSP: 30.0000 mL | Freq: Four times a day (QID) | ORAL | Status: DC | PRN

## 2012-02-05 NOTE — Progress Notes (Signed)
Pt. Being transferred to room 3734 via wheelchair. Patient and patient's family aware of new room number. [facility] report called to [facility]. No further questions or concerns voiced.

## 2012-02-05 NOTE — Progress Notes (Signed)
Patient ID: Zachary Warren, male   DOB: [birth year], 78 y.o.   MRN: [id number] Subjective: Patient admitted with fever chills , change in mental status overnight. According to the wife he had episode of shaking and chills, fever of 103 in the emergency room Elevated white count, head CT was negative, lumbar puncture negative Urine negative Chest x-ray question of congestion? This a.m. Patient is more awake and alert; Denies any shortness of breath or chest pain History of CVA, seizures, A. Fib   Objective: Vital signs in last 24 hours: Temp:  [98.5 F (36.9 C)-102.6 F (39.2 C)] 98.5 F (36.9 C) (04/26 0448) Pulse Rate:  [59-81] 59  (04/26 0400) Resp:  [18-22] 18  (04/26 0400) BP: (104-157)/(46-131) 104/46 mmHg (04/26 0400) SpO2:  [89 %-95 %] 95 % (04/26 0400) FiO2 (%):  [96 %] 96 % (04/25 2304) Weight:  [103.2 kg (227 lb 8.2 oz)] 103.2 kg (227 lb 8.2 oz) (04/26 0150)   Intake/Output from previous day: 04/25 0701 - 04/26 0700 In: 360 [P.O.:360] Out: -     General appearance: alert Resp: clear to auscultation bilaterally Cardio: regular rate and rhythm GI: soft, non-tender; bowel sounds normal; no masses,  no organomegaly Neurologic: Grossly normal  Lab Results:  Basename 02/05/12 0415 02/04/12 2204 02/04/12 2155  WBC 21.1* -- 14.0*  HGB 12.6* 15.3 --  HCT 40.2 45.0 --  PLT 177 -- 189   BMET  Basename 02/05/12 0415 02/04/12 2253  NA 142 143  K 3.5 3.6  CL 109 108  CO2 24 26  GLUCOSE 153* 132*  BUN 21 21  CREATININE 1.44* 1.47*  CALCIUM 8.4 9.1   Lab Results  Component Value Date   ALT 15 02/04/2012   AST 18 02/04/2012   ALKPHOS 94 02/04/2012   BILITOT 0.3 02/04/2012    Assessment/Plan:  Principal Problem:  *SIRS (systemic inflammatory response syndrome) Fever elevated white count etiology unclear question viral syndrome versus pneumonia, patient had history of dysphagia and aspiration in the past. Repeat chest x-ray today. IV fluids and IV  antibiotics. Episodes of shaking could be related to chills or possible seizure episode Lumbar puncture analysis negative, CT of the head negative. Currently on Topamax.  Active Problems:  HYPERLIPIDEMIA-MIXED  HYPERTENSION, UNSPECIFIED Blood pressure stable, beta blocker on hold. Lasix on hold Azotemia, repeat blood work; hold Lasix.  CAD, NATIVE VESSEL  Encephalopathy acute Spinal fluid negative; CT negative  CVA (cerebral infarction)  Seizure Continue Topamax  Dysphagia as late effect of stroke  GERD (gastroesophageal reflux disease) Atrial fibrillation: Normal sinus rhythm, continue amiodarone; pradaxa. Beta blocker on hold. PT consult.  Md Smola 02/05/2012, 8:20 AM

## 2012-02-05 NOTE — ED Notes (Signed)
MDs at bedside performing a procedure

## 2012-02-05 NOTE — Progress Notes (Signed)
   CARE MANAGEMENT NOTE 02/05/2012  Patient:  Zachary Warren, Zachary Warren   Account Number:  [id number]  Date Initiated:  02/05/2012  Documentation initiated by:  GRAVES-BIGELOW,Loistine Eberlin  Subjective/Objective Assessment:   Pt admitted with severe chills. Pt is from home with wife. PT consulted with pt and recommends HH PT. Family has used [facility] in the past.     Action/Plan:   Marcelino Duster with Pharmacy spoke to CM in referrence to pradaxa and assistance. Pt has already used the 30 day free card and will need additional assist. CM did provide pt with pt assist forms. MD please fill out designated area.   Anticipated DC Date:  02/06/2012   Anticipated DC Plan:  HOME W HOME HEALTH SERVICES      DC Planning Services  CM consult      [facility] Choice  HOME HEALTH   Choice offered to / List presented to:  C-1 Patient        HH arranged  HH-2 PT      [facility] agency  [facility]   Status of service:  Completed, signed off Medicare Important Message given?   (If response is "NO", the following Medicare IM given date fields will be blank) Date Medicare IM given:   Date Additional Medicare IM given:    Discharge Disposition:  HOME W HOME HEALTH SERVICES  Per UR Regulation:    If discussed at Long Length of Stay Meetings, dates discussed:    Comments:  02-05-12 1531 Tomi Bamberger, RN,BSN [phone] Pt will need to fax patient assist forms ff or they can fax from MD office to see if appropriate for assistance. CM did call MD Donette Larry for  [facility] orders  and CM did make referral for services. SOC to begin within 24-48 hours post d/c.

## 2012-02-05 NOTE — Evaluation (Addendum)
Physical Therapy Evaluation Patient Details Name: Zachary Warren MRN: [id number] DOB: October 04, 1933 Today's Date: 02/05/2012 Time: 1478-2956 PT Time Calculation (min): 31 min  PT Assessment / Plan / Recommendation Clinical Impression  Patient admitted with SIRS with fever and AMS.  He presents with decreased high level balance and potential for fall risk with h/o right LE weakness s/p CVA.  Wife reports HHPT has helped in the past, and admits both of them having trouble with bed mobility since purchasing new bed and sheets.  Will benefit from skilled PT in acute setting as well to maximze safety for d/c home with wife assist.  Patient also educated in fall risk reduction techniques for the home environment.    PT Assessment  Patient needs continued PT services    Follow Up Recommendations  Home health PT    Equipment Recommendations  None recommended by PT    Frequency Min 3X/week    Precautions / Restrictions Precautions Precautions: Fall   Pertinent Vitals/Pain Denies pain; VSS with mobilty      Mobility  Bed Mobility Bed Mobility: Supine to Sit;Sit to Supine Supine to Sit: 6: Modified independent (Device/Increase time) Sit to Supine: 6: Modified independent (Device/Increase time) Transfers Transfers: Stand to Sit;Sit to Stand Sit to Stand: 4: Min guard;With upper extremity assist;From bed Stand to Sit: 5: Supervision;To bed Details for Transfer Assistance: needed increased attempts for success at sit to stand Ambulation/Gait Ambulation/Gait Assistance: 4: Min guard;5: Supervision Ambulation Distance (Feet): 250 Feet Assistive device: None Ambulation/Gait Assistance Details: no LOB noted with forward gait, but some unsteadiness noted Gait Pattern: Wide base of support;Decreased step length - right;Step-through pattern Stairs: Yes Stairs Assistance: 4: Min guard Stair Management Technique: One rail Left;Alternating pattern Number of Stairs: 4     Exercises     PT  Goals Acute Rehab PT Goals PT Goal Formulation: With patient/family Time For Goal Achievement: 02/12/12 Potential to Achieve Goals: Good Pt will go Sit to Stand: with modified independence PT Goal: Sit to Stand - Progress: Goal set today Pt will go Stand to Sit: with modified independence PT Goal: Stand to Sit - Progress: Goal set today Pt will Ambulate: >150 feet;Independently PT Goal: Ambulate - Progress: Goal set today Pt will Go Up / Down Stairs: 3-5 stairs;with rail(s);with modified independence PT Goal: Up/Down Stairs - Progress: Goal set today  Visit Information  Last PT Received On: 02/05/12 Assistance Needed: +1    Subjective Data  Subjective: Thought wasn't supposed to get up Patient Stated Goal: To return home with wife assist   Prior Functioning  Home Living Lives With: Spouse Available Help at Discharge: Family Type of Home: Apartment (Condo) Home Access: Stairs to enter Secretary/administrator of Steps: 5 Entrance Stairs-Rails: Right Home Layout: One level Bathroom Shower/Tub: Health visitor: Handicapped height Home Adaptive Equipment: Grab bars in shower;Shower chair with back;Built-in shower seat Prior Function Level of Independence: Independent Able to Take Stairs?: Yes Communication Communication: No difficulties    Cognition  Overall Cognitive Status: History of cognitive impairments - at baseline Arousal/Alertness: Awake/alert Behavior During Session: [facility] for tasks performed    Extremity/Trunk Assessment Right Lower Extremity Assessment RLE ROM/Strength/Tone: Deficits RLE ROM/Strength/Tone Deficits: decreased functional strength with backwards walking and with heel raises, wife reports h/o CVA and right weakness Left Lower Extremity Assessment LLE ROM/Strength/Tone: Within functional levels   Balance High Level Balance High Level Balance Activites: Sudden stops;Turns;Backward walking;Head turns High Level Balance Comments: needed  assist due to LOB with backward  walking with right leg giving way; noted cervical ROM limitations with head turns during gait but patient denies pain  End of Session PT - End of Session Equipment Utilized During Treatment: Gait belt Activity Tolerance: Patient tolerated treatment well Patient left: in bed;with call bell/phone within reach;with chair alarm set;with family/visitor present   [facility] 02/05/2012, 3:55 PM

## 2012-02-05 NOTE — Progress Notes (Signed)
02-05-12 UR completed. Zay Yeargan RN BSN  

## 2012-02-05 NOTE — Progress Notes (Signed)
ANTIBIOTIC CONSULT NOTE - INITIAL  Pharmacy Consult for heparin Indication: rule out sepsis  Allergies  Allergen Reactions  . Ramipril     REACTION: cough   Vital Signs: Temp: 101.6 F (38.7 C) (04/26 0114) Temp src: Oral (04/26 0114) BP: 133/77 mmHg (04/26 0114) Pulse Rate: 80  (04/26 0114)  Labs:  Basename 02/04/12 2253 02/04/12 2204 02/04/12 2155  WBC -- -- 14.0*  HGB -- 15.3 14.5  PLT -- -- 189  LABCREA -- -- --  CREATININE 1.47* 1.30 --    Microbiology: Recent Results (from the past 720 hour(s))  GRAM STAIN     Status: Normal   Collection Time   02/05/12 12:26 AM      Component Value Range Status Comment   Specimen Description CSF   Final    Special Requests TUBE 2@2CC    Final    Gram Stain     Final    Value: [id number]     NO ORGANISMS SEEN     WBC PRESENT, PREDOMINANTLY MONONUCLEAR only one wbc seen   Report Status 02/05/2012 FINAL   Final     Medical History: Past Medical History  Diagnosis Date  . Prostate cancer 2006    s/p XRT  . CVA (cerebral infarction) 1999  . DDD (degenerative disc disease), cervical   . Memory impairment   . Persistent atrial fibrillation     on pradaxa  . Coronary artery disease     s/p 5v CABG 2002  . Depression   . GERD (gastroesophageal reflux disease)   . Hyperlipidemia   . Hypertension   . PVD (peripheral vascular disease)   . 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 OSA (obstructive sleep apnea)     Recently diagnosed though pt. has not had CPAP trial  . Seizures   . Pacemaker     Medications:  Prescriptions prior to admission  Medication Sig Dispense Refill  . amiodarone (PACERONE) 200 MG tablet Take 200 mg by mouth daily.        Marland Kitchen atorvastatin (LIPITOR) 40 MG tablet Take 40 mg by mouth daily.      . dabigatran (PRADAXA) 150 MG CAPS Take 150 mg by mouth every 12 (twelve) hours.        Marland Kitchen FLUoxetine (PROZAC) 40 MG capsule Take 40 mg by mouth daily.        . furosemide (LASIX) 40 MG tablet Take 40  mg by mouth daily.        . Lutein 20 MG CAPS Take by mouth.        . metoprolol succinate (TOPROL-XL) 25 MG 24 hr tablet Take 0.5 tablets by mouth Daily.      . Multiple Vitamins-Minerals (MULTIVITAMIN WITH MINERALS) tablet Take 1 tablet by mouth daily. Chewable flinstones      . omeprazole (PRILOSEC OTC) 20 MG tablet Take 20 mg by mouth 2 (two) times daily.        . potassium chloride SA (K-DUR,KLOR-CON) 20 MEQ tablet Take 20 mEq by mouth 2 (two) times daily.        Marland Kitchen topiramate (TOPAMAX) 50 MG tablet Take 50 mg by mouth 2 (two) times daily.         Scheduled:    . acetaminophen  650 mg Oral Once  . amiodarone  200 mg Oral Daily  . atorvastatin  40 mg Oral Daily  . azithromycin (ZITHROMAX) 500 MG IVPB  500 mg Intravenous Once  . cefTRIAXone (ROCEPHIN)  IV  1 g Intravenous Once  . cefTRIAXone (ROCEPHIN)  IV  2 g Intravenous Q12H  . FLUoxetine  40 mg Oral Daily  . pantoprazole  40 mg Oral Q1200  . potassium chloride SA  20 mEq Oral BID  . sodium chloride  1,000 mL Intravenous Once  . sodium chloride  3 mL Intravenous Q12H  . topiramate  50 mg Oral BID  . vancomycin  750 mg Intravenous Q12H  . vancomycin  1,000 mg Intravenous Once  . DISCONTD: omeprazole  20 mg Oral BID   Assessment: 78yo male c/o sudden onset of severe chills, found to be febrile in ED, LP performed, to begin IV ABX for SIRS/encephalopathy.  Goal of Therapy:  Vancomycin trough level 15-20 mcg/ml  Plan:  Vancomycin 1000mg  IV ordered x1; will continue with vanc 750mg  IV Q12H and monitor CBC, Cx, levels prn.  Colleen Can PharmD BCPS 02/05/2012,2:27 AM

## 2012-02-05 NOTE — H&P (Signed)
PCP:   Georgann Housekeeper, MD, MD   Chief Complaint:  Severe chills   HPI: This is a 78 year old male who was at home in bed with his wife earlier tonight, when at 8:15 PM he developed severe chills and shaking. He states he was freezing and ask for a blanket. Per wife this went on for 15 minutes, they got dressed and came to the ER. He does have a chronic cough and a chronic wheeze. Patient's a former smoker. He reports some burning on urination, no significant shortness of breath. In the ER the patient was found to be febrile up to 102 he was also hypoxic and initially tachycardic. In the ER he developed some altered mentation and there's a question of a single episode of diarrhea. There is no nausea or vomiting or abdominal pain. He did receive an LP by the ER physician because of his fever and altered mentation. The hospitalist was called and requested to admit. History provided by the patient and his significant other who is at the bedside. The patient now is alert and oriented.  Review of Systems: Positives bolded   anorexia, fever, weight loss,, vision loss, decreased hearing, hoarseness, chest pain, syncope, dyspnea on exertion, peripheral edema, balance deficits, hemoptysis, abdominal pain, melena, hematochezia, severe indigestion/heartburn, hematuria, incontinence, genital sores, muscle weakness, suspicious skin lesions, transient blindness, difficulty walking, depression, unusual weight change, abnormal bleeding, enlarged lymph nodes, angioedema, and breast masses.  Past Medical History: Past Medical History  Diagnosis Date  . Prostate cancer 2006    s/p XRT  . CVA (cerebral infarction) 1999  . DDD (degenerative disc disease), cervical   . Memory impairment   . Persistent atrial fibrillation     on pradaxa  . Coronary artery disease     s/p 5v CABG 2002  . Depression   . GERD (gastroesophageal reflux disease)   . Hyperlipidemia   . Hypertension   . PVD (peripheral vascular disease)    . 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 OSA (obstructive sleep apnea)     Recently diagnosed though pt. has not had CPAP trial  . Seizures   . Pacemaker    Past Surgical History  Procedure Date  . Coronary artery bypass graft 2002  . Cea 1999  . Pacemaker insertion     implanted by JA 6/12    Medications: Prior to Admission medications   Medication Sig Start Date End Date Taking? Authorizing Provider  amiodarone (PACERONE) 200 MG tablet Take 200 mg by mouth daily.     Yes Historical Provider, MD  atorvastatin (LIPITOR) 40 MG tablet Take 40 mg by mouth daily.   Yes Historical Provider, MD  dabigatran (PRADAXA) 150 MG CAPS Take 150 mg by mouth every 12 (twelve) hours.     Yes Historical Provider, MD  FLUoxetine (PROZAC) 40 MG capsule Take 40 mg by mouth daily.     Yes Historical Provider, MD  furosemide (LASIX) 40 MG tablet Take 40 mg by mouth daily.     Yes Historical Provider, MD  Lutein 20 MG CAPS Take by mouth.     Yes Historical Provider, MD  metoprolol succinate (TOPROL-XL) 25 MG 24 hr tablet Take 0.5 tablets by mouth Daily. 03/26/11  Yes Historical Provider, MD  Multiple Vitamins-Minerals (MULTIVITAMIN WITH MINERALS) tablet Take 1 tablet by mouth daily. Chewable flinstones   Yes Historical Provider, MD  omeprazole (PRILOSEC OTC) 20 MG tablet Take 20 mg by mouth 2 (two) times daily.  Yes Historical Provider, MD  potassium chloride SA (K-DUR,KLOR-CON) 20 MEQ tablet Take 20 mEq by mouth 2 (two) times daily.     Yes Historical Provider, MD  topiramate (TOPAMAX) 50 MG tablet Take 50 mg by mouth 2 (two) times daily.     Yes Historical Provider, MD    Allergies:   Allergies  Allergen Reactions  . Ramipril     REACTION: cough    Social History:  reports that he quit smoking about 13 years ago. His smoking use included Cigarettes. He has a 96 pack-year smoking history. He has never used smokeless tobacco. He reports that he does not drink alcohol or use illicit  drugs.  Family History: Family History  Problem Relation Age of Onset  . Ovarian cancer    . Pancreatic cancer    . Lung cancer      Physical Exam: Filed Vitals:   02/04/12 2145 02/04/12 2254 02/05/12 0102  BP: 153/67  133/77  Pulse: 78  81  Temp: 101.2 F (38.4 C) 102.6 F (39.2 C)   TempSrc: Oral Rectal   Resp: 22    SpO2: 89%  93%    General:  Alert and oriented times three, well developed and nourished, no acute distress Eyes: PERRLA, pink conjunctiva, no scleral icterus ENT: Moist oral mucosa, neck supple, no thyromegaly Lungs: clear to ascultation, no wheeze, no crackles, no use of accessory muscles Cardiovascular: regular rate and rhythm, no regurgitation, no gallops, no murmurs. No carotid bruits, no JVD Abdomen: soft, positive BS, non-tender, non-distended, no organomegaly, not an acute abdomen GU: not examined Neuro: CN II - XII grossly intact, sensation intact Musculoskeletal: strength 5/5 all extremities, no clubbing, cyanosis or edema Skin: no rash, no subcutaneous crepitation, no decubitus Psych: appropriate patient   Labs on Admission:   [facility] 02/04/12 2253 02/04/12 2204  NA 143 146*  K 3.6 3.6  CL 108 110  CO2 26 --  GLUCOSE 132* 140*  BUN 21 23  CREATININE 1.47* 1.30  CALCIUM 9.1 --  MG -- --  PHOS -- --  Results for PRESTIN, MUNCH (MRN [id number]) as of 02/05/2012 01:11  Ref. Range 02/04/2012 22:53  Prothrombin Time Latest Range: 11.6-15.2 seconds 13.8  INR Latest Range: 0.00-1.49  1.04    Basename 02/04/12 2253  AST 18  ALT 15  ALKPHOS 94  BILITOT 0.3  PROT 6.9  ALBUMIN 3.5   No results found for this basename: LIPASE:2,AMYLASE:2 in the last 72 hours  Basename 02/04/12 2204 02/04/12 2155  WBC -- 14.0*  NEUTROABS -- 12.2*  HGB 15.3 14.5  HCT 45.0 44.7  MCV -- 95.3  PLT -- 189   No results found for this basename: CKTOTAL:3,CKMB:3,CKMBINDEX:3,TROPONINI:3 in the last 72 hours No components found with this basename:  POCBNP:3 No results found for this basename: DDIMER:2 in the last 72 hours No results found for this basename: HGBA1C:2 in the last 72 hours No results found for this basename: CHOL:2,HDL:2,LDLCALC:2,TRIG:2,CHOLHDL:2,LDLDIRECT:2 in the last 72 hours No results found for this basename: TSH,T4TOTAL,FREET3,T3FREE,THYROIDAB in the last 72 hours No results found for this basename: VITAMINB12:2,FOLATE:2,FERRITIN:2,TIBC:2,IRON:2,RETICCTPCT:2 in the last 72 hours  Micro Results: No results found for this or any previous visit (from the past 240 hour(s)). Results for TAYSEAN, WAGER (MRN [id number]) as of 02/05/2012 01:11  Ref. Range 02/05/2012 00:02  Color, Urine Latest Range: YELLOW  YELLOW  APPearance Latest Range: CLEAR  CLOUDY (A)  Specific Gravity, Urine Latest Range: 1.005-1.030  1.014  pH Latest Range: 5.0-8.0  8.0  Glucose, UA Latest Range: NEGATIVE mg/dL NEGATIVE  Bilirubin Urine Latest Range: NEGATIVE  NEGATIVE  Ketones, ur Latest Range: NEGATIVE mg/dL NEGATIVE  Protein Latest Range: NEGATIVE mg/dL NEGATIVE  Urobilinogen, UA Latest Range: 0.0-1.0 mg/dL 1.0  Nitrite Latest Range: NEGATIVE  NEGATIVE  Leukocytes, UA Latest Range: NEGATIVE  NEGATIVE  Hgb urine dipstick Latest Range: NEGATIVE  NEGATIVE    Radiological Exams on Admission: Dg Chest 1 View  02/04/2012  *RADIOLOGY REPORT*  Clinical Data: Fever, cough  CHEST - 1 VIEW  Comparison: 03/21/2011  Findings: Presumed partial thyroidectomy or other neck surgery clips partly visualized.  Left-sided dual lead pacer noted. Evidence of CABG.  Mild cardiomegaly with central vascular congestion.  No overt pulmonary edema or pleural effusion.  No focal pulmonary opacity.  IMPRESSION: Cardiomegaly with central vascular congestion that could indicate fluid overload.  No overt signs of edema or focal pulmonary opacity.  Original Report Authenticated By: Harrel Lemon, M.D.   Ct Head Wo Contrast  02/04/2012  *RADIOLOGY REPORT*  Clinical Data:  Fever of unknown origin.  Cough.  History of stroke.  CT HEAD WITHOUT CONTRAST  Technique:  Contiguous axial images were obtained from the base of the skull through the vertex without contrast.  Comparison: 02/19/2003  Findings: Diffuse cerebral atrophy.  Old left frontal temporal infarct with encephalomalacia, stable since previous study. Associated left ventricular dilatation.  No abnormal extra-axial fluid collections.  Low attenuation change in the deep white matter consistent with small vessel ischemia.  Gray-white matter junctions are mostly distinct.  Basal cisterns are not effaced.  No evidence of acute intracranial hemorrhage.  Vascular calcifications.  No depressed skull fractures.  Visualized paranasal sinuses and mastoid air cells are not opacified.  IMPRESSION: Stable appearance of old left frontal temporal infarct.  Chronic atrophy and small vessel ischemia.  No acute intracranial abnormalities.  Original Report Authenticated By: Marlon Pel, M.D.    EKG normal sinus rhythm  Assessment/Plan Present on Admission:  .SIRS (systemic inflammatory response syndrome) Encephalopathy acute Admit to telemetry Patient had LP done in the ER Blood cultures and urine cultures collected and pending Rocephin and vancomycin ordered. patient received a dose of azithromycin in the ER DuoNeb, oxygen ordered along with Tessalon Perles when necessary  .HYPERLIPIDEMIA-MIXED .CAD, NATIVE VESSEL .HYPERTENSION, UNSPECIFIED  atrial fibrillation Stable resume home medications  Will hold pradaxa as patient just underwent an LP. Currently patient is in sinus rhythm   Full code SCDs for prophylaxis Team 9/Dr. Candie Mile, Vernita Tague 02/05/2012, 1:11 AM

## 2012-02-06 LAB — CBC
HCT: 40.4 % (ref 39.0–52.0)
Hemoglobin: 13 g/dL (ref 13.0–17.0)
MCH: 31 pg (ref 26.0–34.0)
MCHC: 32.2 g/dL (ref 30.0–36.0)
RDW: 13.9 % (ref 11.5–15.5)

## 2012-02-06 LAB — BASIC METABOLIC PANEL
BUN: 18 mg/dL (ref 6–23)
Calcium: 8.6 mg/dL (ref 8.4–10.5)
Creatinine, Ser: 1.11 mg/dL (ref 0.50–1.35)
GFR calc Af Amer: 72 mL/min — ABNORMAL LOW (ref >90)
GFR calc non Af Amer: 62 mL/min — ABNORMAL LOW (ref >90)
Glucose, Bld: 109 mg/dL — ABNORMAL HIGH (ref 70–99)

## 2012-02-06 MED ORDER — AMOXICILLIN-POT CLAVULANATE 875-125 MG PO TABS: 1.0000 | ORAL_TABLET | Freq: Two times a day (BID) | ORAL | Status: AC

## 2012-02-06 NOTE — Discharge Summary (Signed)
Physician Discharge Summary  Patient ID: Zachary Warren MRN: [id number] DOB/AGE: [birth year] 78 y.o.  Admit date: 02/04/2012 Discharge date: 02/06/2012  Admission Diagnoses:  Discharge Diagnoses:  Principal Problem:  *SIRS (systemic inflammatory response syndrome) Active Problems:  HYPERLIPIDEMIA-MIXED  HYPERTENSION, UNSPECIFIED  CAD, NATIVE VESSEL  Encephalopathy acute  CVA (cerebral infarction)  Seizure  Dysphagia as late effect of stroke  GERD (gastroesophageal reflux disease)   Discharged Condition: good  Hospital Course: 38 male admitted with MS change, fever and chills episode #1: fever: on admission fever of 102; chills at home.   Labs showed Elevated WBC, blood culture negative, u/a negative, chest xray on arrival no Pneumonia, ? Mild vascular congestion. Pt received IV ABX- with rocephin and vancomycin, IVF; repeat xray chest, showed mild early infiltrate, on right lung, and atalectasis.  WBC count repeat normal. He remained Afebrile, with baseline MS.   ABX changed to PO Augmentin. H/o of CAD, AFib, HTN stable- stay on  same meds. H/o CVA-  PT recommended home PT. F/u in 1-2 week at office.   Consults: None  Significant Diagnostic Studies: labs: cr on admission 1.4; at dischange 1.1; WBC 21, drop to 9.0 at discharge. Blood chemistry normal. , microbiology: blood culture: negative and radiology: CXR: infiltrates: lower lobe on the right  Treatments: IV hydration and antibiotics: vancomycin and ceftriaxone  Discharge Exam: Blood pressure 145/77, pulse 65, temperature 98.1 F (36.7 C), temperature source Oral, resp. rate 14, height 5\' 10"  (1.778 m), weight 101.696 kg (224 lb 3.2 oz), SpO2 95.00%. General appearance: alert and cooperative Resp: clear to auscultation bilaterally Cardio: regular rate and rhythm GI: soft, non-tender; bowel sounds normal; no masses,  no organomegaly Neurologic: Grossly normal  Disposition: 01-Home or Self Care  Discharge Orders    Future Appointments: Provider: Department: Dept Phone: Center:   03/17/2012 12:15 PM Hillis Range, MD [facility] [phone] [facility]     Future Orders Please Complete By Expires   Diet - low sodium heart healthy      Increase activity slowly      Discharge instructions      Comments:   HHN/PT     Medication List  As of 02/06/2012  1:42 PM   TAKE these medications         amiodarone 200 MG tablet   Commonly known as: PACERONE   Take 200 mg by mouth daily.      amoxicillin-clavulanate 875-125 MG per tablet   Commonly known as: AUGMENTIN   Take 1 tablet by mouth 2 (two) times daily.      atorvastatin 40 MG tablet   Commonly known as: LIPITOR   Take 40 mg by mouth daily.      FLUoxetine 40 MG capsule   Commonly known as: PROZAC   Take 40 mg by mouth daily.      furosemide 40 MG tablet   Commonly known as: LASIX   Take 40 mg by mouth daily.      Lutein 20 MG Caps   Take by mouth.      metoprolol succinate 25 MG 24 hr tablet   Commonly known as: TOPROL-XL   Take 0.5 tablets by mouth Daily.      multivitamin with minerals tablet   Take 1 tablet by mouth daily. Chewable flinstones      omeprazole 20 MG tablet   Commonly known as: PRILOSEC OTC   Take 20 mg by mouth 2 (two) times daily.      potassium chloride SA  20 MEQ tablet   Commonly known as: K-DUR,KLOR-CON   Take 20 mEq by mouth 2 (two) times daily.      PRADAXA 150 MG Caps   Generic drug: dabigatran   Take 150 mg by mouth every 12 (twelve) hours.      TOPAMAX 50 MG tablet   Generic drug: topiramate   Take 50 mg by mouth 2 (two) times daily.           Follow-up Information    Follow up with Georgann Housekeeper, MD in 7 days.   Contact information:   [address] [place] [address] [phone]       Follow up with [facility]. (Home Health Physical Therapy)    Contact information:   [phone]         Signed: Georgann Housekeeper 02/06/2012, 1:42  PM

## 2012-02-06 NOTE — Progress Notes (Signed)
   CARE MANAGEMENT NOTE 02/06/2012  Patient:  PATRICE, MOATES   Account Number:  [id number]  Date Initiated:  02/05/2012  Documentation initiated by:  GRAVES-BIGELOW,BRENDA  Subjective/Objective Assessment:   Pt admitted with severe chills. Pt is from home with wife. PT consulted with pt and recommends HH PT. Family has used [facility] in the past.     Action/Plan:   Marcelino Duster with Pharmacy spoke to CM in referrence to pradaxa and assistance. Pt has already used the 30 day free card and will need additional assist. CM did provide pt with pt assist forms. MD please fill out designated area.   Anticipated DC Date:  02/06/2012   Anticipated DC Plan:  HOME W HOME HEALTH SERVICES      DC Planning Services  CM consult      [facility]  HOME HEALTH   Choice offered to / List presented to:  C-1 Patient        HH arranged  HH-2 PT      [facility] agency  [facility]   Status of service:  Completed, signed off Medicare Important Message given?   (If response is "NO", the following Medicare IM given date fields will be blank) Date Medicare IM given:   Date Additional Medicare IM given:    Discharge Disposition:  HOME W HOME HEALTH SERVICES  Per UR Regulation:    If discussed at Long Length of Stay Meetings, dates discussed:    Comments:  02/06/2012 0930 Spoke to wife and states she will call Pradaxa PAP to see if he will qualify for assistance with medication. States $400 copay is too much to pay each month for medication. Contacted [facility] to make aware of pt's scheduled d/c home today. Isidoro Donning RN CCM Case Mgmt phone [phone]  02-05-12 1531 Tomi Bamberger, [place] [phone] Pt will need to fax patient assist forms ff or they can fax from MD office to see if appropriate for assistance. CM did call MD Donette Larry for  [facility] orders  and CM did make referral for services. SOC to begin within 24-48 hours post d/c.

## 2012-02-08 LAB — CSF CULTURE W GRAM STAIN: Culture: NO GROWTH

## 2012-02-10 DIAGNOSIS — G9341 Metabolic encephalopathy: Secondary | ICD-10-CM | POA: Diagnosis not present

## 2012-02-10 DIAGNOSIS — IMO0001 Reserved for inherently not codable concepts without codable children: Secondary | ICD-10-CM | POA: Diagnosis not present

## 2012-02-10 DIAGNOSIS — R6510 Systemic inflammatory response syndrome (SIRS) of non-infectious origin without acute organ dysfunction: Secondary | ICD-10-CM | POA: Diagnosis not present

## 2012-02-10 DIAGNOSIS — I10 Essential (primary) hypertension: Secondary | ICD-10-CM | POA: Diagnosis not present

## 2012-02-10 DIAGNOSIS — I2510 Atherosclerotic heart disease of native coronary artery without angina pectoris: Secondary | ICD-10-CM | POA: Diagnosis not present

## 2012-02-10 DIAGNOSIS — I69920 Aphasia following unspecified cerebrovascular disease: Secondary | ICD-10-CM | POA: Diagnosis not present

## 2012-02-11 LAB — CULTURE, BLOOD (ROUTINE X 2)
Culture  Setup Time: 201304260341
Culture: NO GROWTH

## 2012-02-12 DIAGNOSIS — I10 Essential (primary) hypertension: Secondary | ICD-10-CM | POA: Diagnosis not present

## 2012-02-12 DIAGNOSIS — IMO0001 Reserved for inherently not codable concepts without codable children: Secondary | ICD-10-CM | POA: Diagnosis not present

## 2012-02-12 DIAGNOSIS — I2510 Atherosclerotic heart disease of native coronary artery without angina pectoris: Secondary | ICD-10-CM | POA: Diagnosis not present

## 2012-02-12 DIAGNOSIS — G9341 Metabolic encephalopathy: Secondary | ICD-10-CM | POA: Diagnosis not present

## 2012-02-12 DIAGNOSIS — R6510 Systemic inflammatory response syndrome (SIRS) of non-infectious origin without acute organ dysfunction: Secondary | ICD-10-CM | POA: Diagnosis not present

## 2012-02-12 DIAGNOSIS — I69920 Aphasia following unspecified cerebrovascular disease: Secondary | ICD-10-CM | POA: Diagnosis not present

## 2012-02-15 DIAGNOSIS — I10 Essential (primary) hypertension: Secondary | ICD-10-CM | POA: Diagnosis not present

## 2012-02-15 DIAGNOSIS — G9341 Metabolic encephalopathy: Secondary | ICD-10-CM | POA: Diagnosis not present

## 2012-02-15 DIAGNOSIS — I69920 Aphasia following unspecified cerebrovascular disease: Secondary | ICD-10-CM | POA: Diagnosis not present

## 2012-02-15 DIAGNOSIS — R6510 Systemic inflammatory response syndrome (SIRS) of non-infectious origin without acute organ dysfunction: Secondary | ICD-10-CM | POA: Diagnosis not present

## 2012-02-15 DIAGNOSIS — IMO0001 Reserved for inherently not codable concepts without codable children: Secondary | ICD-10-CM | POA: Diagnosis not present

## 2012-02-15 DIAGNOSIS — I2510 Atherosclerotic heart disease of native coronary artery without angina pectoris: Secondary | ICD-10-CM | POA: Diagnosis not present

## 2012-02-17 DIAGNOSIS — R6510 Systemic inflammatory response syndrome (SIRS) of non-infectious origin without acute organ dysfunction: Secondary | ICD-10-CM | POA: Diagnosis not present

## 2012-02-17 DIAGNOSIS — I69920 Aphasia following unspecified cerebrovascular disease: Secondary | ICD-10-CM | POA: Diagnosis not present

## 2012-02-17 DIAGNOSIS — G9341 Metabolic encephalopathy: Secondary | ICD-10-CM | POA: Diagnosis not present

## 2012-02-17 DIAGNOSIS — IMO0001 Reserved for inherently not codable concepts without codable children: Secondary | ICD-10-CM | POA: Diagnosis not present

## 2012-02-17 DIAGNOSIS — I2510 Atherosclerotic heart disease of native coronary artery without angina pectoris: Secondary | ICD-10-CM | POA: Diagnosis not present

## 2012-02-17 DIAGNOSIS — I10 Essential (primary) hypertension: Secondary | ICD-10-CM | POA: Diagnosis not present

## 2012-02-19 DIAGNOSIS — G9341 Metabolic encephalopathy: Secondary | ICD-10-CM | POA: Diagnosis not present

## 2012-02-19 DIAGNOSIS — R6510 Systemic inflammatory response syndrome (SIRS) of non-infectious origin without acute organ dysfunction: Secondary | ICD-10-CM | POA: Diagnosis not present

## 2012-02-19 DIAGNOSIS — I69920 Aphasia following unspecified cerebrovascular disease: Secondary | ICD-10-CM | POA: Diagnosis not present

## 2012-02-19 DIAGNOSIS — IMO0001 Reserved for inherently not codable concepts without codable children: Secondary | ICD-10-CM | POA: Diagnosis not present

## 2012-02-19 DIAGNOSIS — I2510 Atherosclerotic heart disease of native coronary artery without angina pectoris: Secondary | ICD-10-CM | POA: Diagnosis not present

## 2012-02-19 DIAGNOSIS — I10 Essential (primary) hypertension: Secondary | ICD-10-CM | POA: Diagnosis not present

## 2012-02-22 DIAGNOSIS — G9341 Metabolic encephalopathy: Secondary | ICD-10-CM | POA: Diagnosis not present

## 2012-02-22 DIAGNOSIS — I10 Essential (primary) hypertension: Secondary | ICD-10-CM | POA: Diagnosis not present

## 2012-02-22 DIAGNOSIS — R6510 Systemic inflammatory response syndrome (SIRS) of non-infectious origin without acute organ dysfunction: Secondary | ICD-10-CM | POA: Diagnosis not present

## 2012-02-22 DIAGNOSIS — I69920 Aphasia following unspecified cerebrovascular disease: Secondary | ICD-10-CM | POA: Diagnosis not present

## 2012-02-22 DIAGNOSIS — I2510 Atherosclerotic heart disease of native coronary artery without angina pectoris: Secondary | ICD-10-CM | POA: Diagnosis not present

## 2012-02-22 DIAGNOSIS — IMO0001 Reserved for inherently not codable concepts without codable children: Secondary | ICD-10-CM | POA: Diagnosis not present

## 2012-02-24 DIAGNOSIS — R6510 Systemic inflammatory response syndrome (SIRS) of non-infectious origin without acute organ dysfunction: Secondary | ICD-10-CM | POA: Diagnosis not present

## 2012-02-24 DIAGNOSIS — I10 Essential (primary) hypertension: Secondary | ICD-10-CM | POA: Diagnosis not present

## 2012-02-24 DIAGNOSIS — I69920 Aphasia following unspecified cerebrovascular disease: Secondary | ICD-10-CM | POA: Diagnosis not present

## 2012-02-24 DIAGNOSIS — IMO0001 Reserved for inherently not codable concepts without codable children: Secondary | ICD-10-CM | POA: Diagnosis not present

## 2012-02-24 DIAGNOSIS — G9341 Metabolic encephalopathy: Secondary | ICD-10-CM | POA: Diagnosis not present

## 2012-02-24 DIAGNOSIS — I2510 Atherosclerotic heart disease of native coronary artery without angina pectoris: Secondary | ICD-10-CM | POA: Diagnosis not present

## 2012-02-26 DIAGNOSIS — IMO0001 Reserved for inherently not codable concepts without codable children: Secondary | ICD-10-CM | POA: Diagnosis not present

## 2012-02-26 DIAGNOSIS — I2510 Atherosclerotic heart disease of native coronary artery without angina pectoris: Secondary | ICD-10-CM | POA: Diagnosis not present

## 2012-02-26 DIAGNOSIS — I69920 Aphasia following unspecified cerebrovascular disease: Secondary | ICD-10-CM | POA: Diagnosis not present

## 2012-02-26 DIAGNOSIS — R6510 Systemic inflammatory response syndrome (SIRS) of non-infectious origin without acute organ dysfunction: Secondary | ICD-10-CM | POA: Diagnosis not present

## 2012-02-26 DIAGNOSIS — I10 Essential (primary) hypertension: Secondary | ICD-10-CM | POA: Diagnosis not present

## 2012-02-26 DIAGNOSIS — G9341 Metabolic encephalopathy: Secondary | ICD-10-CM | POA: Diagnosis not present

## 2012-02-29 DIAGNOSIS — G9341 Metabolic encephalopathy: Secondary | ICD-10-CM | POA: Diagnosis not present

## 2012-02-29 DIAGNOSIS — IMO0001 Reserved for inherently not codable concepts without codable children: Secondary | ICD-10-CM | POA: Diagnosis not present

## 2012-02-29 DIAGNOSIS — R6510 Systemic inflammatory response syndrome (SIRS) of non-infectious origin without acute organ dysfunction: Secondary | ICD-10-CM | POA: Diagnosis not present

## 2012-02-29 DIAGNOSIS — I2510 Atherosclerotic heart disease of native coronary artery without angina pectoris: Secondary | ICD-10-CM | POA: Diagnosis not present

## 2012-02-29 DIAGNOSIS — I10 Essential (primary) hypertension: Secondary | ICD-10-CM | POA: Diagnosis not present

## 2012-02-29 DIAGNOSIS — I69920 Aphasia following unspecified cerebrovascular disease: Secondary | ICD-10-CM | POA: Diagnosis not present

## 2012-03-01 DIAGNOSIS — I2510 Atherosclerotic heart disease of native coronary artery without angina pectoris: Secondary | ICD-10-CM | POA: Diagnosis not present

## 2012-03-01 DIAGNOSIS — I69920 Aphasia following unspecified cerebrovascular disease: Secondary | ICD-10-CM | POA: Diagnosis not present

## 2012-03-01 DIAGNOSIS — G9341 Metabolic encephalopathy: Secondary | ICD-10-CM | POA: Diagnosis not present

## 2012-03-01 DIAGNOSIS — I10 Essential (primary) hypertension: Secondary | ICD-10-CM | POA: Diagnosis not present

## 2012-03-01 DIAGNOSIS — IMO0001 Reserved for inherently not codable concepts without codable children: Secondary | ICD-10-CM | POA: Diagnosis not present

## 2012-03-01 DIAGNOSIS — R6510 Systemic inflammatory response syndrome (SIRS) of non-infectious origin without acute organ dysfunction: Secondary | ICD-10-CM | POA: Diagnosis not present

## 2012-03-03 DIAGNOSIS — R6510 Systemic inflammatory response syndrome (SIRS) of non-infectious origin without acute organ dysfunction: Secondary | ICD-10-CM | POA: Diagnosis not present

## 2012-03-03 DIAGNOSIS — I69920 Aphasia following unspecified cerebrovascular disease: Secondary | ICD-10-CM | POA: Diagnosis not present

## 2012-03-03 DIAGNOSIS — I2510 Atherosclerotic heart disease of native coronary artery without angina pectoris: Secondary | ICD-10-CM | POA: Diagnosis not present

## 2012-03-03 DIAGNOSIS — I10 Essential (primary) hypertension: Secondary | ICD-10-CM | POA: Diagnosis not present

## 2012-03-03 DIAGNOSIS — G9341 Metabolic encephalopathy: Secondary | ICD-10-CM | POA: Diagnosis not present

## 2012-03-03 DIAGNOSIS — IMO0001 Reserved for inherently not codable concepts without codable children: Secondary | ICD-10-CM | POA: Diagnosis not present

## 2012-03-06 DIAGNOSIS — F329 Major depressive disorder, single episode, unspecified: Secondary | ICD-10-CM | POA: Diagnosis not present

## 2012-03-06 DIAGNOSIS — J189 Pneumonia, unspecified organism: Secondary | ICD-10-CM | POA: Diagnosis not present

## 2012-03-06 DIAGNOSIS — I10 Essential (primary) hypertension: Secondary | ICD-10-CM | POA: Diagnosis not present

## 2012-03-15 DIAGNOSIS — I10 Essential (primary) hypertension: Secondary | ICD-10-CM | POA: Diagnosis not present

## 2012-03-15 DIAGNOSIS — F329 Major depressive disorder, single episode, unspecified: Secondary | ICD-10-CM | POA: Diagnosis not present

## 2012-03-15 DIAGNOSIS — J189 Pneumonia, unspecified organism: Secondary | ICD-10-CM | POA: Diagnosis not present

## 2012-03-17 ENCOUNTER — Ambulatory Visit (INDEPENDENT_AMBULATORY_CARE_PROVIDER_SITE_OTHER): Payer: Medicare Other | Admitting: Internal Medicine

## 2012-03-17 ENCOUNTER — Encounter: Payer: Self-pay | Admitting: Internal Medicine

## 2012-03-17 VITALS — BP 110/64 | HR 74 | Ht 70.0 in | Wt 229.0 lb

## 2012-03-17 DIAGNOSIS — I4891 Unspecified atrial fibrillation: Secondary | ICD-10-CM | POA: Diagnosis not present

## 2012-03-17 DIAGNOSIS — I495 Sick sinus syndrome: Secondary | ICD-10-CM

## 2012-03-17 LAB — PACEMAKER DEVICE OBSERVATION
AL IMPEDENCE PM: 601 Ohm
BATTERY VOLTAGE: 2.79 V
RV LEAD AMPLITUDE: 15.68 mv
RV LEAD IMPEDENCE PM: 450 Ohm
VENTRICULAR PACING PM: 0

## 2012-03-17 NOTE — Progress Notes (Signed)
PCP: Georgann Housekeeper, MD, MD Primary Cardiologist:  Dr Anne Fu  The patient presents today for routine electrophysiology followup.  Since his last visit, the patient reports doing reasonably well.  He was admitted 4/13 with a febrile illness.  Blood cultures were negative.  This resolved quickly with antibiotics and symptoms/ fevers have not recurred. Today, he denies symptoms of palpitations, chest pain, shortness of breath, orthopnea, PND, lower extremity edema, presyncope, syncope, or neurologic sequela.  The patient feels that he is tolerating medications without difficulties and is otherwise without complaint today.   Past Medical History  Diagnosis Date  . Prostate cancer 2006    s/p XRT  . CVA (cerebral infarction) 1999  . DDD (degenerative disc disease), cervical   . Memory impairment   . Persistent atrial fibrillation     on pradaxa  . Coronary artery disease     s/p 5v CABG 2002  . Depression   . GERD (gastroesophageal reflux disease)   . Hyperlipidemia   . Hypertension   . PVD (peripheral vascular disease)   . 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 OSA (obstructive sleep apnea)     Recently diagnosed though pt. has not had CPAP trial  . Seizures   . Pacemaker    Past Surgical History  Procedure Date  . Coronary artery bypass graft 2002  . Cea 1999  . Pacemaker insertion     implanted by [facility] 6/12    Current Outpatient Prescriptions  Medication Sig Dispense Refill  . amiodarone (PACERONE) 200 MG tablet Take 200 mg by mouth daily.        Marland Kitchen atorvastatin (LIPITOR) 40 MG tablet Take 40 mg by mouth daily.      . dabigatran (PRADAXA) 150 MG CAPS Take 150 mg by mouth every 12 (twelve) hours.        Marland Kitchen FLUoxetine (PROZAC) 40 MG capsule Take 40 mg by mouth daily.        . furosemide (LASIX) 40 MG tablet Take 40 mg by mouth daily.        . Lutein 20 MG CAPS Take by mouth.        . metoprolol succinate (TOPROL-XL) 25 MG 24 hr tablet Take 0.5 tablets by mouth Daily.       . Multiple Vitamins-Minerals (MULTIVITAMIN WITH MINERALS) tablet Take 1 tablet by mouth daily. Chewable flinstones      . omeprazole (PRILOSEC OTC) 20 MG tablet Take 20 mg by mouth 2 (two) times daily.        . potassium chloride SA (K-DUR,KLOR-CON) 20 MEQ tablet Take 20 mEq by mouth 2 (two) times daily.        . simvastatin (ZOCOR) 20 MG tablet Take 20 mg by mouth daily.       Marland Kitchen topiramate (TOPAMAX) 50 MG tablet Take 50 mg by mouth 2 (two) times daily.          Allergies  Allergen Reactions  . Ramipril     REACTION: cough    History   Social History  . Marital Status: Married    Spouse Name: N/A    Number of Children: N/A  . Years of Education: N/A   Occupational History  . Not on file.   Social History Main Topics  . Smoking status: Former Smoker -- 2.0 packs/day for 48 years    Types: Cigarettes    Quit date: 03/09/1998  . Smokeless tobacco: Never Used  . Alcohol Use: No  . Drug  Use: No  . Sexually Active: Not on file   Other Topics Concern  . Not on file   Social History Narrative   Pt lives in [place] with spouse.  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    Family History  Problem Relation Age of Onset  . Ovarian cancer    . Pancreatic cancer    . Lung cancer      Physical Exam: Filed Vitals:   03/17/12 1223  BP: 110/64  Pulse: 74  Height: 5\' 10"  (1.778 m)  Weight: 229 lb (103.874 kg)    GEN- The patient is well appearing, alert and oriented x 3 today.   Head- normocephalic, atraumatic Eyes-  Sclera clear, conjunctiva pink Ears- hearing intact Oropharynx- clear Neck- supple, no JVP Lymph- no cervical lymphadenopathy Lungs- Clear to ausculation bilaterally, normal work of breathing Chest- pacemaker pocket is well healed Heart- Regular rate and rhythm, no murmurs, rubs or gallops, PMI not laterally displaced GI- soft, NT, ND, + BS Extremities- no clubbing, cyanosis, or edema  Pacemaker interrogation- reviewed in detail today,   See PACEART report  Assessment and Plan:

## 2012-03-17 NOTE — Assessment & Plan Note (Signed)
Well controlled with amiodarone Dr Anne Fu to follow PFTs/ TFTs/ LFTs Dr Anne Fu to follow [facility] on pradaxa  He will see Dr Anne Fu regularly and I will see him in 12 months in [facility] for pacemaker check

## 2012-03-17 NOTE — Assessment & Plan Note (Signed)
Normal pacemaker function See Pace Art report No changes today  

## 2012-03-17 NOTE — Patient Instructions (Addendum)
Remote monitoring is used to monitor your Pacemaker of ICD from home. This monitoring reduces the number of office visits required to check your device to one time per year. It allows [place] to keep an eye on the functioning of your device to ensure it is working properly. You are scheduled for a device check from home on 06/23/2012. You may send your transmission at any time that day. If you have a wireless device, the transmission will be sent automatically. After your physician reviews your transmission, you will receive a postcard with your next transmission date.  Your physician wants you to follow-up in: [place] with Dr. Johney Frame in a year.  You will receive a reminder letter in the mail two months in advance. If you don't receive a letter, please call our office to schedule the follow-up appointment.

## 2012-04-16 ENCOUNTER — Emergency Department (HOSPITAL_COMMUNITY)
Admission: EM | Admit: 2012-04-16 | Discharge: 2012-04-16 | Disposition: A | Discharge status: Home / Self Care | Payer: Medicare Other | Attending: Emergency Medicine | Admitting: Emergency Medicine

## 2012-04-16 ENCOUNTER — Emergency Department (HOSPITAL_COMMUNITY): Payer: Medicare Other

## 2012-04-16 ENCOUNTER — Encounter (HOSPITAL_COMMUNITY): Payer: Self-pay | Admitting: Emergency Medicine

## 2012-04-16 DIAGNOSIS — I10 Essential (primary) hypertension: Secondary | ICD-10-CM | POA: Insufficient documentation

## 2012-04-16 DIAGNOSIS — K219 Gastro-esophageal reflux disease without esophagitis: Secondary | ICD-10-CM | POA: Insufficient documentation

## 2012-04-16 DIAGNOSIS — I739 Peripheral vascular disease, unspecified: Secondary | ICD-10-CM | POA: Diagnosis not present

## 2012-04-16 DIAGNOSIS — Z950 Presence of cardiac pacemaker: Secondary | ICD-10-CM | POA: Diagnosis not present

## 2012-04-16 DIAGNOSIS — Z8546 Personal history of malignant neoplasm of prostate: Secondary | ICD-10-CM | POA: Insufficient documentation

## 2012-04-16 DIAGNOSIS — G40909 Epilepsy, unspecified, not intractable, without status epilepticus: Secondary | ICD-10-CM | POA: Diagnosis not present

## 2012-04-16 DIAGNOSIS — F329 Major depressive disorder, single episode, unspecified: Secondary | ICD-10-CM | POA: Diagnosis not present

## 2012-04-16 DIAGNOSIS — Z8673 Personal history of transient ischemic attack (TIA), and cerebral infarction without residual deficits: Secondary | ICD-10-CM | POA: Diagnosis not present

## 2012-04-16 DIAGNOSIS — S0180XA Unspecified open wound of other part of head, initial encounter: Secondary | ICD-10-CM | POA: Insufficient documentation

## 2012-04-16 DIAGNOSIS — I4891 Unspecified atrial fibrillation: Secondary | ICD-10-CM | POA: Diagnosis not present

## 2012-04-16 DIAGNOSIS — E785 Hyperlipidemia, unspecified: Secondary | ICD-10-CM | POA: Diagnosis not present

## 2012-04-16 DIAGNOSIS — G4733 Obstructive sleep apnea (adult) (pediatric): Secondary | ICD-10-CM | POA: Insufficient documentation

## 2012-04-16 DIAGNOSIS — Z043 Encounter for examination and observation following other accident: Secondary | ICD-10-CM | POA: Diagnosis not present

## 2012-04-16 DIAGNOSIS — I2510 Atherosclerotic heart disease of native coronary artery without angina pectoris: Secondary | ICD-10-CM | POA: Diagnosis not present

## 2012-04-16 DIAGNOSIS — IMO0002 Reserved for concepts with insufficient information to code with codable children: Secondary | ICD-10-CM

## 2012-04-16 DIAGNOSIS — W19XXXA Unspecified fall, initial encounter: Secondary | ICD-10-CM

## 2012-04-16 DIAGNOSIS — Z79899 Other long term (current) drug therapy: Secondary | ICD-10-CM | POA: Diagnosis not present

## 2012-04-16 DIAGNOSIS — W06XXXA Fall from bed, initial encounter: Secondary | ICD-10-CM | POA: Insufficient documentation

## 2012-04-16 DIAGNOSIS — F3289 Other specified depressive episodes: Secondary | ICD-10-CM | POA: Insufficient documentation

## 2012-04-16 MED ORDER — LIDOCAINE HCL (PF) 1 % IJ SOLN
INTRAMUSCULAR | Status: AC
  Administered 2012-04-16: 06:00:00
  Filled 2012-04-16: qty 5

## 2012-04-16 NOTE — ED Provider Notes (Signed)
History     CSN: [id number]  Arrival date & time 04/16/12  0520   First MD Initiated Contact with Patient 04/16/12 0544      Chief Complaint  Patient presents with  . Facial Laceration    (Consider location/radiation/quality/duration/timing/severity/associated sxs/prior treatment) HPI  Zachary Warren is a 78 y.o. male who presents to the Emergency Department complaining of laceration to left forehead after rolling over in his sleep and falling out of bed. He hit his head on the bedside table. No LOC.   PCP Dr. Donette Larry  Past Medical History  Diagnosis Date  . CVA (cerebral infarction) 1999  . DDD (degenerative disc disease), cervical   . Memory impairment   . Persistent atrial fibrillation     on pradaxa  . Coronary artery disease     s/p 5v CABG 2002  . Depression   . GERD (gastroesophageal reflux disease)   . Hyperlipidemia   . Hypertension   . PVD (peripheral vascular disease)   . 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 OSA (obstructive sleep apnea)     Recently diagnosed though pt. has not had CPAP trial  . Seizures   . Pacemaker   . Prostate cancer 2006    s/p XRT    Past Surgical History  Procedure Date  . Coronary artery bypass graft 2002  . Cea 1999  . Pacemaker insertion     implanted by [facility] 6/12    Family History  Problem Relation Age of Onset  . Ovarian cancer    . Pancreatic cancer    . Lung cancer      History  Substance Use Topics  . Smoking status: Former Smoker -- 2.0 packs/day for 48 years    Types: Cigarettes    Quit date: 03/09/1998  . Smokeless tobacco: Never Used  . Alcohol Use: No      Review of Systems  Constitutional: Negative for fever.       10 Systems reviewed and are negative for acute change except as noted in the HPI.  HENT: Negative for congestion.   Eyes: Negative for discharge and redness.  Respiratory: Negative for cough and shortness of breath.   Cardiovascular: Negative for chest pain.    Gastrointestinal: Negative for vomiting and abdominal pain.  Musculoskeletal: Negative for back pain.  Skin: Negative for rash.       Laceration to left forehead  Neurological: Negative for syncope, numbness and headaches.  Psychiatric/Behavioral:       No behavior change.    Allergies  Ramipril  Home Medications   Current Outpatient Rx  Name Route Sig Dispense Refill  . AMIODARONE HCL 200 MG PO TABS Oral Take 200 mg by mouth daily.      . ATORVASTATIN CALCIUM 40 MG PO TABS Oral Take 40 mg by mouth daily.    Marland Kitchen DABIGATRAN ETEXILATE MESYLATE 150 MG PO CAPS Oral Take 150 mg by mouth every 12 (twelve) hours.      Marland Kitchen FLUOXETINE HCL 40 MG PO CAPS Oral Take 40 mg by mouth daily.      . FUROSEMIDE 40 MG PO TABS Oral Take 40 mg by mouth daily.      . LUTEIN 20 MG PO CAPS Oral Take by mouth.      . METOPROLOL SUCCINATE ER 25 MG PO TB24 Oral Take 0.5 tablets by mouth Daily.    . MULTI-VITAMIN/MINERALS PO TABS Oral Take 1 tablet by mouth daily. Chewable flinstones    .  OMEPRAZOLE MAGNESIUM 20 MG PO TBEC Oral Take 20 mg by mouth 2 (two) times daily.      Marland Kitchen POTASSIUM CHLORIDE CRYS ER 20 MEQ PO TBCR Oral Take 20 mEq by mouth 2 (two) times daily.      Marland Kitchen SIMVASTATIN 20 MG PO TABS Oral Take 20 mg by mouth daily.     . TOPIRAMATE 50 MG PO TABS Oral Take 50 mg by mouth 2 (two) times daily.        BP 174/88  Pulse 89  Temp 98.2 F (36.8 C) (Oral)  Resp 18  Ht 5\' 10"  (1.778 m)  Wt 225 lb (102.059 kg)  BMI 32.28 kg/m2  SpO2 96%  Physical Exam  Nursing note and vitals reviewed. Constitutional: He appears well-developed and well-nourished.       Awake, alert, nontoxic appearance.  HENT:  Head: Normocephalic.  Right Ear: External ear normal.  Left Ear: External ear normal.  Mouth/Throat: Oropharynx is clear and moist.       1.5 cm laceration adjacent to left eyebrow  Eyes: Right eye exhibits no discharge. Left eye exhibits no discharge.  Neck: Normal range of motion. Neck supple.   Cardiovascular: Normal heart sounds.        pacemaker  Pulmonary/Chest: Effort normal and breath sounds normal. He exhibits no tenderness.  Abdominal: Soft. There is no tenderness. There is no rebound.  Musculoskeletal: Normal range of motion. He exhibits no tenderness.       Baseline ROM, no obvious new focal weakness.  Neurological:       Mental status and motor strength appears baseline for patient and situation.  Skin: No rash noted.  Psychiatric: He has a normal mood and affect.    ED Course  Procedures (including critical care time)  LACERATION REPAIR Performed by: Annamarie Dawley. Authorized by: Annamarie Dawley Consent: Verbal consent obtained. Risks and benefits: risks, benefits and alternatives were discussed Consent given by: patient Patient identity confirmed: provided demographic data Prepped and Draped in normal sterile fashion Wound explored  Laceration Location: left eyebrow Laceration Length: 1.5 cm  No Foreign Bodies seen or palpated  Anesthesia: local infiltration  Local anesthetic: lidocaine1% w/o epinephrine  Anesthetic total: 1.5 ml  Irrigation method: syringe Amount of cleaning: standard  Skin closure: sutures  Number of sutures: 3 Technique: simple interuppted  Patient tolerance: Patient tolerated the procedure well with no immediate complications. Ct Head Wo Contrast  04/16/2012  *RADIOLOGY REPORT*  Clinical Data:  Laceration to the left forehead, adjacent to the left eye.  Larey Seat out of bed, and hit head on bedside table.  Concern for cervical spine injury.  CT HEAD WITHOUT CONTRAST AND CT CERVICAL SPINE WITHOUT CONTRAST  Technique:  Multidetector CT imaging of the head and cervical spine was performed following the standard protocol without intravenous contrast.  Multiplanar CT image reconstructions of the cervical spine were also generated.  Comparison: CT of the head performed 02/04/2012, and MRI of the brain performed 06/22/2008  CT HEAD   Findings: There is no evidence of acute infarction, mass lesion, or intra- or extra-axial hemorrhage on CT.  Prominence of the ventricles and sulci reflects moderate cortical volume loss.  There is chronic encephalomalacia at the left frontoparietal region, involving the left basal ganglia, with associated ex vacuo dilatation of the left lateral ventricle. Diffuse periventricular and subcortical white matter change likely reflects small vessel ischemic microangiopathy.  The posterior fossa, including the cerebellum, brainstem and fourth ventricle, is within normal limits.  No mass  effect or midline shift is seen.  There is no evidence of fracture; visualized osseous structures are unremarkable in appearance.  The visualized portions of the orbits are within normal limits.  The paranasal sinuses and mastoid air cells are well-aerated.  Soft tissue swelling is noted lateral to the left orbit.  IMPRESSION:  1.  No evidence of traumatic intracranial injury or fracture. 2.  Soft tissue swelling noted lateral to the left orbit. 3.  Moderate cortical volume loss and diffuse small vessel ischemic microangiopathy. 4.  Chronic encephalomalacia at the left frontoparietal region, involving the left basal ganglia, with ex vacuo dilatation of the left lateral ventricle.  CT CERVICAL SPINE  Findings: There is no evidence of fracture or subluxation. Vertebral bodies demonstrate normal height and alignment.  There is chronic osseous fusion at C5-C7, with narrowing of the intervertebral disc space at C4-C5.  Mild degenerative change is noted about the dens.  Prevertebral soft tissues are within normal limits.  A dense calcification is noted within the left thyroid lobe; the thyroid gland is otherwise unremarkable in appearance.  Scattered calcification is noted along the proximal great vessels; mild postoperative change is noted along the left common carotid artery. The visualized lung apices are clear.  Postoperative change is noted  adjacent to the inferior aspect of the left parotid gland.  IMPRESSION:  1.  No evidence of fracture or subluxation along the cervical spine. 2.  Chronic osseous fusion at C5-C7, with mild associated degenerative change. 3.  Scattered calcification along the proximal great vessels.  Original Report Authenticated By: Tonia Ghent, M.D.   Ct Cervical Spine Wo Contrast  04/16/2012  *RADIOLOGY REPORT*  Clinical Data:  Laceration to the left forehead, adjacent to the left eye.  Larey Seat out of bed, and hit head on bedside table.  Concern for cervical spine injury.  CT HEAD WITHOUT CONTRAST AND CT CERVICAL SPINE WITHOUT CONTRAST  Technique:  Multidetector CT imaging of the head and cervical spine was performed following the standard protocol without intravenous contrast.  Multiplanar CT image reconstructions of the cervical spine were also generated.  Comparison: CT of the head performed 02/04/2012, and MRI of the brain performed 06/22/2008  CT HEAD  Findings: There is no evidence of acute infarction, mass lesion, or intra- or extra-axial hemorrhage on CT.  Prominence of the ventricles and sulci reflects moderate cortical volume loss.  There is chronic encephalomalacia at the left frontoparietal region, involving the left basal ganglia, with associated ex vacuo dilatation of the left lateral ventricle. Diffuse periventricular and subcortical white matter change likely reflects small vessel ischemic microangiopathy.  The posterior fossa, including the cerebellum, brainstem and fourth ventricle, is within normal limits.  No mass effect or midline shift is seen.  There is no evidence of fracture; visualized osseous structures are unremarkable in appearance.  The visualized portions of the orbits are within normal limits.  The paranasal sinuses and mastoid air cells are well-aerated.  Soft tissue swelling is noted lateral to the left orbit.  IMPRESSION:  1.  No evidence of traumatic intracranial injury or fracture. 2.  Soft  tissue swelling noted lateral to the left orbit. 3.  Moderate cortical volume loss and diffuse small vessel ischemic microangiopathy. 4.  Chronic encephalomalacia at the left frontoparietal region, involving the left basal ganglia, with ex vacuo dilatation of the left lateral ventricle.  CT CERVICAL SPINE  Findings: There is no evidence of fracture or subluxation. Vertebral bodies demonstrate normal height and alignment.  There is chronic  osseous fusion at C5-C7, with narrowing of the intervertebral disc space at C4-C5.  Mild degenerative change is noted about the dens.  Prevertebral soft tissues are within normal limits.  A dense calcification is noted within the left thyroid lobe; the thyroid gland is otherwise unremarkable in appearance.  Scattered calcification is noted along the proximal great vessels; mild postoperative change is noted along the left common carotid artery. The visualized lung apices are clear.  Postoperative change is noted adjacent to the inferior aspect of the left parotid gland.  IMPRESSION:  1.  No evidence of fracture or subluxation along the cervical spine. 2.  Chronic osseous fusion at C5-C7, with mild associated degenerative change. 3.  Scattered calcification along the proximal great vessels.  Original Report Authenticated By: Tonia Ghent, M.D.     MDM  Patient fell out of bed sustaining a laceration to the left lateral eyebrow. Laceration repaired.CTs negative for acute changes. Pt stable in ED with no significant deterioration in condition.The patient appears reasonably screened and/or stabilized for discharge and I doubt any other medical condition or other [facility] requiring further screening, evaluation, or treatment in the ED at this time prior to discharge.  MDM Reviewed: nursing note and vitals Interpretation: CT scan           Nicoletta Dress. Colon Branch, MD 04/16/12 [phone]

## 2012-04-16 NOTE — ED Notes (Addendum)
MD at bedside. MD placing sutures in forehead. Pt tolerated well; family at bedside

## 2012-04-16 NOTE — ED Notes (Addendum)
Pt has laceration on L forehead to the left of L eye. Also, knot present that extends to mid center of head. Pt reports falling OOB hitting head on bedside table. No loss of conscience per pt

## 2012-04-16 NOTE — ED Notes (Addendum)
Patient transported to CT 

## 2012-04-16 NOTE — ED Notes (Addendum)
Pt states that he fell out of bed while he was sleeping this morning. States he thinks he rolled out of bed while in the middle of his sleep. Pt states he woke up right after he fell. Pt is currently aax3 NAD noted. 1-2cm laceration noted over L brow. Bleeding controlled.

## 2012-04-19 DIAGNOSIS — I69959 Hemiplegia and hemiparesis following unspecified cerebrovascular disease affecting unspecified side: Secondary | ICD-10-CM | POA: Diagnosis not present

## 2012-04-19 DIAGNOSIS — G250 Essential tremor: Secondary | ICD-10-CM | POA: Diagnosis not present

## 2012-04-19 DIAGNOSIS — G252 Other specified forms of tremor: Secondary | ICD-10-CM | POA: Diagnosis not present

## 2012-04-23 ENCOUNTER — Emergency Department (HOSPITAL_COMMUNITY)
Admission: EM | Admit: 2012-04-23 | Discharge: 2012-04-23 | Disposition: A | Discharge status: Home / Self Care | Payer: Medicare Other | Attending: Emergency Medicine | Admitting: Emergency Medicine

## 2012-04-23 ENCOUNTER — Encounter (HOSPITAL_COMMUNITY): Payer: Self-pay

## 2012-04-23 DIAGNOSIS — Z8041 Family history of malignant neoplasm of ovary: Secondary | ICD-10-CM | POA: Diagnosis not present

## 2012-04-23 DIAGNOSIS — Z800 Family history of malignant neoplasm of digestive organs: Secondary | ICD-10-CM | POA: Insufficient documentation

## 2012-04-23 DIAGNOSIS — F329 Major depressive disorder, single episode, unspecified: Secondary | ICD-10-CM | POA: Insufficient documentation

## 2012-04-23 DIAGNOSIS — Z801 Family history of malignant neoplasm of trachea, bronchus and lung: Secondary | ICD-10-CM | POA: Insufficient documentation

## 2012-04-23 DIAGNOSIS — Z4802 Encounter for removal of sutures: Secondary | ICD-10-CM | POA: Diagnosis not present

## 2012-04-23 DIAGNOSIS — E785 Hyperlipidemia, unspecified: Secondary | ICD-10-CM | POA: Diagnosis not present

## 2012-04-23 DIAGNOSIS — I10 Essential (primary) hypertension: Secondary | ICD-10-CM | POA: Insufficient documentation

## 2012-04-23 DIAGNOSIS — M5030 Other cervical disc degeneration, unspecified cervical region: Secondary | ICD-10-CM | POA: Insufficient documentation

## 2012-04-23 DIAGNOSIS — I2510 Atherosclerotic heart disease of native coronary artery without angina pectoris: Secondary | ICD-10-CM | POA: Diagnosis not present

## 2012-04-23 DIAGNOSIS — Z951 Presence of aortocoronary bypass graft: Secondary | ICD-10-CM | POA: Insufficient documentation

## 2012-04-23 DIAGNOSIS — G4733 Obstructive sleep apnea (adult) (pediatric): Secondary | ICD-10-CM | POA: Insufficient documentation

## 2012-04-23 DIAGNOSIS — Z87891 Personal history of nicotine dependence: Secondary | ICD-10-CM | POA: Diagnosis not present

## 2012-04-23 DIAGNOSIS — Z8673 Personal history of transient ischemic attack (TIA), and cerebral infarction without residual deficits: Secondary | ICD-10-CM | POA: Insufficient documentation

## 2012-04-23 DIAGNOSIS — F3289 Other specified depressive episodes: Secondary | ICD-10-CM | POA: Insufficient documentation

## 2012-04-23 DIAGNOSIS — I739 Peripheral vascular disease, unspecified: Secondary | ICD-10-CM | POA: Diagnosis not present

## 2012-04-23 NOTE — ED Notes (Signed)
Here for suture removal from left eye brow

## 2012-04-23 NOTE — ED Provider Notes (Signed)
History     CSN: [id number]  Arrival date & time 04/23/12  4098   First MD Initiated Contact with Patient 04/23/12 [phone]      Chief Complaint  Patient presents with  . Suture / Staple Removal    (Consider location/radiation/quality/duration/timing/severity/associated sxs/prior treatment) HPI Comments: Here for suture removal  The history is provided by the patient. No language interpreter was used.    Past Medical History  Diagnosis Date  . CVA (cerebral infarction) 1999  . DDD (degenerative disc disease), cervical   . Memory impairment   . Persistent atrial fibrillation     on pradaxa  . Coronary artery disease     s/p 5v CABG 2002  . Depression   . GERD (gastroesophageal reflux disease)   . Hyperlipidemia   . Hypertension   . PVD (peripheral vascular disease)   . 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 OSA (obstructive sleep apnea)     Recently diagnosed though pt. has not had CPAP trial  . Seizures   . Pacemaker   . Prostate cancer 2006    s/p XRT    Past Surgical History  Procedure Date  . Coronary artery bypass graft 2002  . Cea 1999  . Pacemaker insertion     implanted by [facility] 6/12    Family History  Problem Relation Age of Onset  . Ovarian cancer    . Pancreatic cancer    . Lung cancer      History  Substance Use Topics  . Smoking status: Former Smoker -- 2.0 packs/day for 48 years    Types: Cigarettes    Quit date: 03/09/1998  . Smokeless tobacco: Never Used  . Alcohol Use: No      Review of Systems  Constitutional: Negative for fever and chills.  Skin: Positive for wound.  All other systems reviewed and are negative.    Allergies  Ramipril  Home Medications   Current Outpatient Rx  Name Route Sig Dispense Refill  . AMIODARONE HCL 200 MG PO TABS Oral Take 200 mg by mouth daily.      . ATORVASTATIN CALCIUM 40 MG PO TABS Oral Take 40 mg by mouth daily.    Marland Kitchen DABIGATRAN ETEXILATE MESYLATE 150 MG PO CAPS Oral Take 150 mg  by mouth every 12 (twelve) hours.      Marland Kitchen FLUOXETINE HCL 40 MG PO CAPS Oral Take 40 mg by mouth daily.      . FUROSEMIDE 40 MG PO TABS Oral Take 40 mg by mouth daily.      . LUTEIN 20 MG PO CAPS Oral Take by mouth.      . METOPROLOL SUCCINATE ER 25 MG PO TB24 Oral Take 0.5 tablets by mouth Daily.    . MULTI-VITAMIN/MINERALS PO TABS Oral Take 1 tablet by mouth daily. Chewable flinstones    . OMEPRAZOLE MAGNESIUM 20 MG PO TBEC Oral Take 20 mg by mouth 2 (two) times daily.      Marland Kitchen POTASSIUM CHLORIDE CRYS ER 20 MEQ PO TBCR Oral Take 20 mEq by mouth 2 (two) times daily.      Marland Kitchen SIMVASTATIN 20 MG PO TABS Oral Take 20 mg by mouth daily.     . TOPIRAMATE 50 MG PO TABS Oral Take 50 mg by mouth 2 (two) times daily.        BP 133/61  Pulse 88  Temp 98.5 F (36.9 C) (Oral)  Resp 18  SpO2 99%  Physical Exam  Nursing note and vitals reviewed. Constitutional: He is oriented to person, place, and time. He appears well-developed and well-nourished.  HENT:  Head: Normocephalic and atraumatic.         Well-healing tiny lac to L lateral eyebrow.  No signs of infection   Eyes: EOM are normal.  Neck: Normal range of motion.  Cardiovascular: Normal rate, regular rhythm, normal heart sounds and intact distal pulses.   Pulmonary/Chest: Effort normal and breath sounds normal. No respiratory distress.  Abdominal: Soft. He exhibits no distension. There is no tenderness.  Musculoskeletal: Normal range of motion.  Neurological: He is alert and oriented to person, place, and time.  Skin: Skin is warm and dry.  Psychiatric: He has a normal mood and affect. Judgment normal.    ED Course  Procedures (including critical care time)  Labs Reviewed - No data to display No results found.   1. Encounter for removal of sutures       MDM   no infection.  Return prn        Evalina Field, [place] 04/23/12 [phone]

## 2012-04-28 NOTE — ED Provider Notes (Signed)
Medical screening examination/treatment/procedure(s) were performed by non-physician practitioner and as supervising physician I was immediately available for consultation/collaboration.   Shelda Jakes, MD 04/28/12 [phone]

## 2012-05-02 DIAGNOSIS — M79609 Pain in unspecified limb: Secondary | ICD-10-CM | POA: Diagnosis not present

## 2012-05-27 DIAGNOSIS — K219 Gastro-esophageal reflux disease without esophagitis: Secondary | ICD-10-CM | POA: Diagnosis not present

## 2012-05-27 DIAGNOSIS — I10 Essential (primary) hypertension: Secondary | ICD-10-CM | POA: Diagnosis not present

## 2012-05-27 DIAGNOSIS — I6990 Unspecified sequelae of unspecified cerebrovascular disease: Secondary | ICD-10-CM | POA: Diagnosis not present

## 2012-05-27 DIAGNOSIS — E669 Obesity, unspecified: Secondary | ICD-10-CM | POA: Diagnosis not present

## 2012-05-27 DIAGNOSIS — F329 Major depressive disorder, single episode, unspecified: Secondary | ICD-10-CM | POA: Diagnosis not present

## 2012-05-27 DIAGNOSIS — I4891 Unspecified atrial fibrillation: Secondary | ICD-10-CM | POA: Diagnosis not present

## 2012-05-27 DIAGNOSIS — M25559 Pain in unspecified hip: Secondary | ICD-10-CM | POA: Diagnosis not present

## 2012-05-27 DIAGNOSIS — E782 Mixed hyperlipidemia: Secondary | ICD-10-CM | POA: Diagnosis not present

## 2012-05-31 DIAGNOSIS — IMO0002 Reserved for concepts with insufficient information to code with codable children: Secondary | ICD-10-CM | POA: Diagnosis not present

## 2012-05-31 DIAGNOSIS — M47817 Spondylosis without myelopathy or radiculopathy, lumbosacral region: Secondary | ICD-10-CM | POA: Diagnosis not present

## 2012-06-01 ENCOUNTER — Other Ambulatory Visit: Payer: Self-pay | Admitting: Orthopedic Surgery

## 2012-06-01 DIAGNOSIS — M479 Spondylosis, unspecified: Secondary | ICD-10-CM

## 2012-06-01 DIAGNOSIS — M25559 Pain in unspecified hip: Secondary | ICD-10-CM

## 2012-06-09 ENCOUNTER — Ambulatory Visit
Admission: RE | Admit: 2012-06-09 | Discharge: 2012-06-09 | Disposition: A | Discharge status: Home / Self Care | Payer: Medicare Other | Source: Ambulatory Visit | Attending: Orthopedic Surgery | Admitting: Orthopedic Surgery

## 2012-06-09 DIAGNOSIS — M479 Spondylosis, unspecified: Secondary | ICD-10-CM

## 2012-06-09 DIAGNOSIS — M25559 Pain in unspecified hip: Secondary | ICD-10-CM

## 2012-06-09 NOTE — Progress Notes (Signed)
Wife states patient has been off Pradaxa and Fluoxetine for the past two days.  Larina Earthly, RN

## 2012-06-09 NOTE — Progress Notes (Signed)
Discharge instructions explained to patient and his wife. Victory Dakin, RN Pt's wife states she made a mistake and had him stop his lasix instead of fluoxetine.

## 2012-06-15 ENCOUNTER — Ambulatory Visit
Admission: RE | Admit: 2012-06-15 | Discharge: 2012-06-15 | Disposition: A | Discharge status: Home / Self Care | Payer: Medicare Other | Source: Ambulatory Visit | Attending: Orthopedic Surgery | Admitting: Orthopedic Surgery

## 2012-06-15 VITALS — BP 121/66 | HR 77

## 2012-06-15 DIAGNOSIS — M479 Spondylosis, unspecified: Secondary | ICD-10-CM

## 2012-06-15 DIAGNOSIS — M549 Dorsalgia, unspecified: Secondary | ICD-10-CM | POA: Diagnosis not present

## 2012-06-15 MED ORDER — DIAZEPAM 5 MG PO TABS: 5.0000 mg | ORAL_TABLET | Freq: Once | ORAL | Status: DC

## 2012-06-15 MED ORDER — IOHEXOL 180 MG/ML  SOLN
17.0000 mL | Freq: Once | INTRAMUSCULAR | Status: AC | PRN
  Administered 2012-06-15: 17 mL via INTRATHECAL

## 2012-06-15 NOTE — Progress Notes (Signed)
Wife at bedside.  Patient resting quietly without complaint (other than he's cold).  Blanket on patient, AC adjusted.  jkl

## 2012-06-23 ENCOUNTER — Ambulatory Visit (INDEPENDENT_AMBULATORY_CARE_PROVIDER_SITE_OTHER): Payer: Medicare Other | Admitting: *Deleted

## 2012-06-23 ENCOUNTER — Encounter: Payer: Self-pay | Admitting: Internal Medicine

## 2012-06-23 DIAGNOSIS — M47817 Spondylosis without myelopathy or radiculopathy, lumbosacral region: Secondary | ICD-10-CM | POA: Diagnosis not present

## 2012-06-23 DIAGNOSIS — I495 Sick sinus syndrome: Secondary | ICD-10-CM | POA: Diagnosis not present

## 2012-06-27 ENCOUNTER — Encounter: Payer: Self-pay | Admitting: Vascular Surgery

## 2012-06-28 ENCOUNTER — Ambulatory Visit (INDEPENDENT_AMBULATORY_CARE_PROVIDER_SITE_OTHER): Payer: Medicare Other | Admitting: Vascular Surgery

## 2012-06-28 ENCOUNTER — Encounter: Payer: Self-pay | Admitting: Vascular Surgery

## 2012-06-28 VITALS — BP 133/80 | HR 89 | Resp 20 | Ht 70.0 in | Wt 227.0 lb

## 2012-06-28 DIAGNOSIS — I729 Aneurysm of unspecified site: Secondary | ICD-10-CM | POA: Insufficient documentation

## 2012-06-28 NOTE — Progress Notes (Signed)
[facility] [place]   Patient name: Zachary Warren MRN: [id number] DOB: 08-18-1933 Sex: male   Referred by: [place]  Reason for referral:  Chief Complaint  Patient presents with  . Aneurysm    New aortic aneurysm  referred by Dr Simonne Come    HISTORY OF PRESENT ILLNESS: Patient is a 78 year old gentleman who underwent lumbar myelogram CT scan for evaluation of back pain. He had an incidental finding of extensive calcification of his aortoiliac vessels and also of a 3 cm) 1 abdominal aortic dilatation. He has no history of other aneurysmal disease and has no family history of aneurysmal disease. He has no history of lower extremity claudication or rest pain or tissue loss. He does have a history of coronary bypass grafting over 10 years ago. He remained stable from cardiac standpoint. He does have a pacemaker. He is here today with his wife and daughter.  Past Medical History  Diagnosis Date  . CVA (cerebral infarction) 1999  . DDD (degenerative disc disease), cervical   . Memory impairment   . Persistent atrial fibrillation     on pradaxa  . Coronary artery disease     s/p 5v CABG 2002  . Depression   . GERD (gastroesophageal reflux disease)   . Hyperlipidemia   . Hypertension   . PVD (peripheral vascular disease)   . 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 OSA (obstructive sleep apnea)     Recently diagnosed though pt. has not had CPAP trial  . Seizures   . Pacemaker   . Prostate cancer 2006    s/p XRT    Past Surgical History  Procedure Date  . Coronary artery bypass graft 2002  . Cea 1999  . Pacemaker insertion     implanted by JA 6/12    History   Social History  . Marital Status: Married    Spouse Name: N/A    Number of Children: N/A  . Years of Education: N/A   Occupational History  . Not on file.   Social History Main Topics  . Smoking status: Former Smoker -- 2.0 packs/day for 48 years    Types: Cigarettes   Quit date: 03/09/1998  . Smokeless tobacco: Never Used  . Alcohol Use: No  . Drug Use: No  . Sexually Active: No   Other Topics Concern  . Not on file   Social History Narrative   Pt lives in [place] with spouse.  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    Family History  Problem Relation Age of Onset  . Ovarian cancer    . Pancreatic cancer    . Lung cancer    . Cancer Mother     ovarian  . Cancer Father     pancreatic  . Cancer Brother     lung    Allergies as of 06/28/2012 - Review Complete 06/28/2012  Allergen Reaction Noted  . Ramipril Other (See Comments) 11/24/2010    Current Outpatient Prescriptions on File Prior to Visit  Medication Sig Dispense Refill  . amiodarone (PACERONE) 200 MG tablet Take 200 mg by mouth daily.        Marland Kitchen atorvastatin (LIPITOR) 40 MG tablet Take 40 mg by mouth daily.      . dabigatran (PRADAXA) 150 MG CAPS Take 150 mg by mouth every 12 (twelve) hours.        Marland Kitchen FLUoxetine (PROZAC) 40 MG capsule Take 40 mg by mouth  daily.        . furosemide (LASIX) 40 MG tablet Take 40 mg by mouth daily.        . Lutein 20 MG CAPS Take by mouth.        . metoprolol succinate (TOPROL-XL) 25 MG 24 hr tablet Take 0.5 tablets by mouth Daily.      . Multiple Vitamins-Minerals (MULTIVITAMIN WITH MINERALS) tablet Take 1 tablet by mouth daily. Chewable flinstones      . omeprazole (PRILOSEC OTC) 20 MG tablet Take 20 mg by mouth 2 (two) times daily.        . potassium chloride SA (K-DUR,KLOR-CON) 20 MEQ tablet Take 20 mEq by mouth 2 (two) times daily.        Marland Kitchen topiramate (TOPAMAX) 50 MG tablet Take 50 mg by mouth 2 (two) times daily.        . simvastatin (ZOCOR) 20 MG tablet Take 20 mg by mouth daily.          REVIEW OF SYSTEMS:  Positives indicated with an "X"  CARDIOVASCULAR:  [ ]  chest pain   [ ]  chest pressure   [ ]  palpitations   [ ]  orthopnea   [ ]  dyspnea on exertion   [ ]  claudication   [ ]  rest pain   [ ]  DVT   [ ]   phlebitis PULMONARY:   [ ]  productive cough   [ ]  asthma   [ ]  wheezing NEUROLOGIC:   [ ]  weakness  [ ]  paresthesias  [ ]  aphasia  [ ]  amaurosis  [ ]  dizziness HEMATOLOGIC:   [ ]  bleeding problems   [ ]  clotting disorders MUSCULOSKELETAL:  [ ]  joint pain   [ ]  joint swelling GASTROINTESTINAL: [ ]   blood in stool  [ ]   hematemesis GENITOURINARY:  [ ]   dysuria  [ ]   hematuria PSYCHIATRIC:  [ ]  history of major depression INTEGUMENTARY:  [ ]  rashes  [ ]  ulcers CONSTITUTIONAL:  [ ]  fever   [ ]  chills  PHYSICAL EXAMINATION:  General: The patient is a well-nourished male, in no acute distress. Vital signs are BP 133/80  Pulse 89  Resp 20  Ht 5\' 10"  (1.778 m)  Wt 227 lb (102.967 kg)  BMI 32.57 kg/m2 Pulmonary: There is a good air exchange bilaterally without wheezing or rales. Abdomen: Soft and non-tender with normal pitch bowel sounds. No aneurysm palpable Musculoskeletal: There are no major deformities.  There is no significant extremity pain. Neurologic: No focal weakness or paresthesias are detected, Skin: There are no ulcer or rashes noted. Psychiatric: The patient has normal affect. Answers questions appropriately with the help of his family Cardiovascular: There is a regular rate and rhythm without significant murmur appreciated. Pulse status: 2+ radial 2+ femoral 2+ popliteal 2+ dorsalis pedis and 2+ posterior tibial pulses bilaterally Carotid artery: No bruits bilaterally  I did review his CT scan and films from a CT myelogram. He does have a 3 cm infrarenal aortic enlargement. On measuring his juxtarenal aorta the size is 2.5 cm. He does have some ectasia of his iliac arteries bilaterally with a maximal diameter 1.8 cm  Impression and Plan:  Had a long discussion with the patient and his family present. I explained that although his infrarenal aorta is 3 cm the area adjacent this is 2.5. I explained that this does not technically constituted aneurysm but some ectasia of the  artery. Regarding his extensive calcification he does have normal distal flow and I explained  that this is of no consequence and would be expected with his history of coronary disease. I have would not recommend any further followup. I did discuss the option of ultrasound to this is not necessary since he has a very small ectasia of his infrarenal aorta. They're comfortable with this and will see [place] again on an as-needed basis    Euell Schiff [facility] [place] Office: [phone]

## 2012-07-01 LAB — REMOTE PACEMAKER DEVICE
ATRIAL PACING PM: 97
BAMS-0001: 175 {beats}/min
RV LEAD AMPLITUDE: 22.4 mv
RV LEAD IMPEDENCE PM: 458 Ohm
RV LEAD THRESHOLD: 1 V

## 2012-07-15 ENCOUNTER — Encounter: Payer: Self-pay | Admitting: *Deleted

## 2012-07-15 DIAGNOSIS — E782 Mixed hyperlipidemia: Secondary | ICD-10-CM | POA: Diagnosis not present

## 2012-07-15 DIAGNOSIS — I959 Hypotension, unspecified: Secondary | ICD-10-CM | POA: Diagnosis not present

## 2012-07-15 DIAGNOSIS — Z950 Presence of cardiac pacemaker: Secondary | ICD-10-CM | POA: Diagnosis not present

## 2012-07-15 DIAGNOSIS — Z7901 Long term (current) use of anticoagulants: Secondary | ICD-10-CM | POA: Diagnosis not present

## 2012-07-15 DIAGNOSIS — I2510 Atherosclerotic heart disease of native coronary artery without angina pectoris: Secondary | ICD-10-CM | POA: Diagnosis not present

## 2012-07-20 ENCOUNTER — Other Ambulatory Visit: Payer: Self-pay | Admitting: Dermatology

## 2012-07-20 DIAGNOSIS — C4441 Basal cell carcinoma of skin of scalp and neck: Secondary | ICD-10-CM | POA: Diagnosis not present

## 2012-07-20 DIAGNOSIS — D485 Neoplasm of uncertain behavior of skin: Secondary | ICD-10-CM | POA: Diagnosis not present

## 2012-07-20 DIAGNOSIS — L820 Inflamed seborrheic keratosis: Secondary | ICD-10-CM | POA: Diagnosis not present

## 2012-07-26 DIAGNOSIS — E782 Mixed hyperlipidemia: Secondary | ICD-10-CM | POA: Diagnosis not present

## 2012-07-29 DIAGNOSIS — Z23 Encounter for immunization: Secondary | ICD-10-CM | POA: Diagnosis not present

## 2012-09-15 DIAGNOSIS — I4891 Unspecified atrial fibrillation: Secondary | ICD-10-CM | POA: Diagnosis not present

## 2012-09-15 DIAGNOSIS — F329 Major depressive disorder, single episode, unspecified: Secondary | ICD-10-CM | POA: Diagnosis not present

## 2012-09-15 DIAGNOSIS — I6990 Unspecified sequelae of unspecified cerebrovascular disease: Secondary | ICD-10-CM | POA: Diagnosis not present

## 2012-09-15 DIAGNOSIS — E782 Mixed hyperlipidemia: Secondary | ICD-10-CM | POA: Diagnosis not present

## 2012-09-15 DIAGNOSIS — K219 Gastro-esophageal reflux disease without esophagitis: Secondary | ICD-10-CM | POA: Diagnosis not present

## 2012-09-19 ENCOUNTER — Telehealth: Payer: Self-pay | Admitting: *Deleted

## 2012-09-19 NOTE — Telephone Encounter (Signed)
Spoke with patient's wife.  Advised ok to send remote Carelink transmission anytime over this upcoming weekend for transmission scheduled for Monday.  Wife has knee surgery scheduled for 12-16 and was concerned about not being home to send transmission.

## 2012-09-25 ENCOUNTER — Encounter: Payer: Self-pay | Admitting: Internal Medicine

## 2012-09-26 ENCOUNTER — Ambulatory Visit (INDEPENDENT_AMBULATORY_CARE_PROVIDER_SITE_OTHER): Payer: Medicare Other | Admitting: *Deleted

## 2012-09-26 DIAGNOSIS — I495 Sick sinus syndrome: Secondary | ICD-10-CM

## 2012-09-26 DIAGNOSIS — Z950 Presence of cardiac pacemaker: Secondary | ICD-10-CM | POA: Diagnosis not present

## 2012-10-09 LAB — REMOTE PACEMAKER DEVICE
AL IMPEDENCE PM: 564 Ohm
ATRIAL PACING PM: 90
RV LEAD IMPEDENCE PM: 436 Ohm
VENTRICULAR PACING PM: 0

## 2012-10-18 ENCOUNTER — Encounter: Payer: Self-pay | Admitting: *Deleted

## 2013-01-02 ENCOUNTER — Ambulatory Visit (INDEPENDENT_AMBULATORY_CARE_PROVIDER_SITE_OTHER): Payer: Medicare Other | Admitting: *Deleted

## 2013-01-02 ENCOUNTER — Other Ambulatory Visit: Payer: Self-pay | Admitting: Internal Medicine

## 2013-01-02 ENCOUNTER — Encounter: Payer: Self-pay | Admitting: Internal Medicine

## 2013-01-02 DIAGNOSIS — Z950 Presence of cardiac pacemaker: Secondary | ICD-10-CM | POA: Diagnosis not present

## 2013-01-02 DIAGNOSIS — I495 Sick sinus syndrome: Secondary | ICD-10-CM

## 2013-01-11 LAB — REMOTE PACEMAKER DEVICE
ATRIAL PACING PM: 85
BAMS-0001: 175 {beats}/min
RV LEAD THRESHOLD: 0.875 V

## 2013-01-13 DIAGNOSIS — E782 Mixed hyperlipidemia: Secondary | ICD-10-CM | POA: Diagnosis not present

## 2013-01-13 DIAGNOSIS — I2510 Atherosclerotic heart disease of native coronary artery without angina pectoris: Secondary | ICD-10-CM | POA: Diagnosis not present

## 2013-01-13 DIAGNOSIS — I4891 Unspecified atrial fibrillation: Secondary | ICD-10-CM | POA: Diagnosis not present

## 2013-01-16 DIAGNOSIS — Z79899 Other long term (current) drug therapy: Secondary | ICD-10-CM | POA: Diagnosis not present

## 2013-01-16 DIAGNOSIS — E782 Mixed hyperlipidemia: Secondary | ICD-10-CM | POA: Diagnosis not present

## 2013-01-16 DIAGNOSIS — I4891 Unspecified atrial fibrillation: Secondary | ICD-10-CM | POA: Diagnosis not present

## 2013-01-24 ENCOUNTER — Encounter: Payer: Self-pay | Admitting: *Deleted

## 2013-02-02 DIAGNOSIS — Z1331 Encounter for screening for depression: Secondary | ICD-10-CM | POA: Diagnosis not present

## 2013-02-02 DIAGNOSIS — I2510 Atherosclerotic heart disease of native coronary artery without angina pectoris: Secondary | ICD-10-CM | POA: Diagnosis not present

## 2013-02-02 DIAGNOSIS — Z0000 Encounter for general adult medical examination without abnormal findings: Secondary | ICD-10-CM | POA: Diagnosis not present

## 2013-02-02 DIAGNOSIS — R7309 Other abnormal glucose: Secondary | ICD-10-CM | POA: Diagnosis not present

## 2013-02-02 DIAGNOSIS — I10 Essential (primary) hypertension: Secondary | ICD-10-CM | POA: Diagnosis not present

## 2013-02-02 DIAGNOSIS — E782 Mixed hyperlipidemia: Secondary | ICD-10-CM | POA: Diagnosis not present

## 2013-02-02 DIAGNOSIS — Z8546 Personal history of malignant neoplasm of prostate: Secondary | ICD-10-CM | POA: Diagnosis not present

## 2013-02-02 DIAGNOSIS — I4891 Unspecified atrial fibrillation: Secondary | ICD-10-CM | POA: Diagnosis not present

## 2013-02-02 DIAGNOSIS — I69991 Dysphagia following unspecified cerebrovascular disease: Secondary | ICD-10-CM | POA: Diagnosis not present

## 2013-03-15 ENCOUNTER — Encounter: Payer: Self-pay | Admitting: Internal Medicine

## 2013-03-15 ENCOUNTER — Ambulatory Visit (INDEPENDENT_AMBULATORY_CARE_PROVIDER_SITE_OTHER): Payer: Medicare Other | Admitting: Internal Medicine

## 2013-03-15 VITALS — BP 116/68 | HR 83 | Ht 69.5 in | Wt 223.6 lb

## 2013-03-15 DIAGNOSIS — I4891 Unspecified atrial fibrillation: Secondary | ICD-10-CM | POA: Diagnosis not present

## 2013-03-15 DIAGNOSIS — I495 Sick sinus syndrome: Secondary | ICD-10-CM

## 2013-03-15 LAB — PACEMAKER DEVICE OBSERVATION
AL AMPLITUDE: 1.4 mv
AL THRESHOLD: 0.5 V
RV LEAD IMPEDENCE PM: 442 Ohm
RV LEAD THRESHOLD: 1 V
VENTRICULAR PACING PM: 0

## 2013-03-15 NOTE — Patient Instructions (Addendum)
Remote monitoring is used to monitor your Pacemaker of ICD from home. This monitoring reduces the number of office visits required to check your device to one time per year. It allows [place] to keep an eye on the functioning of your device to ensure it is working properly. You are scheduled for a device check from home on June 19, 2013. You may send your transmission at any time that day. If you have a wireless device, the transmission will be sent automatically. After your physician reviews your transmission, you will receive a postcard with your next transmission date.  Your physician wants you to follow-up in: 1 year with [employer].  You will receive a reminder letter in the mail two months in advance. If you don't receive a letter, please call our office to schedule the follow-up appointment.

## 2013-03-15 NOTE — Progress Notes (Signed)
PCP: Georgann Housekeeper, MD Primary Cardiologist:  Dr Anne Fu  The patient presents today for routine electrophysiology followup.  Since his last visit, the patient reports doing reasonably well.  Today, he denies symptoms of palpitations, chest pain, shortness of breath, orthopnea, PND, lower extremity edema, presyncope, syncope, or neurologic sequela.  The patient feels that he is tolerating medications without difficulties and is otherwise without complaint today.   Past Medical History  Diagnosis Date  . CVA (cerebral infarction) 1999  . DDD (degenerative disc disease), cervical   . Memory impairment   . Persistent atrial fibrillation     on pradaxa  . Coronary artery disease     s/p 5v CABG 2002  . Depression   . GERD (gastroesophageal reflux disease)   . Hyperlipidemia   . Hypertension   . PVD (peripheral vascular disease)   . 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 OSA (obstructive sleep apnea)     Recently diagnosed though pt. has not had CPAP trial  . Seizures   . Pacemaker   . Prostate cancer 2006    s/p XRT   Past Surgical History  Procedure Laterality Date  . Coronary artery bypass graft  2002  . Cea  1999  . Pacemaker insertion      implanted by [facility] 6/12    Current Outpatient Prescriptions  Medication Sig Dispense Refill  . amiodarone (PACERONE) 200 MG tablet Take 200 mg by mouth daily.        Marland Kitchen atorvastatin (LIPITOR) 40 MG tablet Take 40 mg by mouth daily.      . dabigatran (PRADAXA) 150 MG CAPS Take 150 mg by mouth every 12 (twelve) hours.        Marland Kitchen FLUoxetine (PROZAC) 40 MG capsule Take 40 mg by mouth daily.        . furosemide (LASIX) 40 MG tablet Take 40 mg by mouth daily.        . Lutein 20 MG CAPS Take by mouth.        . Multiple Vitamins-Minerals (MULTIVITAMIN WITH MINERALS) tablet Take 1 tablet by mouth daily. Chewable flinstones      . omeprazole (PRILOSEC OTC) 20 MG tablet Take 20 mg by mouth 2 (two) times daily.        . potassium chloride SA  (K-DUR,KLOR-CON) 20 MEQ tablet Take 20 mEq by mouth 2 (two) times daily.        Marland Kitchen topiramate (TOPAMAX) 50 MG tablet Take 50 mg by mouth 2 (two) times daily.         No current facility-administered medications for this visit.    Allergies  Allergen Reactions  . Ramipril Other (See Comments)     cough    History   Social History  . Marital Status: Married    Spouse Name: N/A    Number of Children: N/A  . Years of Education: N/A   Occupational History  . Not on file.   Social History Main Topics  . Smoking status: Former Smoker -- 2.00 packs/day for 48 years    Types: Cigarettes    Quit date: 03/09/1998  . Smokeless tobacco: Never Used  . Alcohol Use: No  . Drug Use: No  . Sexually Active: No   Other Topics Concern  . Not on file   Social History Narrative   Pt lives in [place] with spouse.  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    Family History  Problem Relation Age of  Onset  . Ovarian cancer    . Pancreatic cancer    . Lung cancer    . Cancer Mother     ovarian  . Cancer Father     pancreatic  . Cancer Brother     lung    Physical Exam: Filed Vitals:   03/15/13 1154  BP: 116/68  Pulse: 83  Height: 5' 9.5" (1.765 m)  Weight: 223 lb 9.6 oz (101.424 kg)    GEN- The patient is well appearing, alert and oriented x 3 today.   Head- normocephalic, atraumatic Eyes-  Sclera clear, conjunctiva pink Ears- hearing intact Oropharynx- clear Neck- supple, no JVP Lymph- no cervical lymphadenopathy Lungs- Clear to ausculation bilaterally, normal work of breathing Chest- pacemaker pocket is well healed Heart- Regular rate and rhythm, no murmurs, rubs or gallops, PMI not laterally displaced GI- soft, NT, ND, + BS Extremities- no clubbing, cyanosis, or edema  Pacemaker interrogation- reviewed in detail today,  See PACEART report  Assessment and Plan:  1. Tachycardia/ bradycardia syndrome Normal pacemaker function See Pace Art report No  changes today  2. Afib Stable Dr Anne Fu to follow creatinine clearance on pradaxa and LFTs/TFTs with amiodarone  carelink every 3 months  Return to see Nehemiah Settle in 1 year

## 2013-04-11 DIAGNOSIS — I10 Essential (primary) hypertension: Secondary | ICD-10-CM | POA: Diagnosis not present

## 2013-04-11 DIAGNOSIS — E782 Mixed hyperlipidemia: Secondary | ICD-10-CM | POA: Diagnosis not present

## 2013-04-11 DIAGNOSIS — I2510 Atherosclerotic heart disease of native coronary artery without angina pectoris: Secondary | ICD-10-CM | POA: Diagnosis not present

## 2013-04-11 DIAGNOSIS — I4891 Unspecified atrial fibrillation: Secondary | ICD-10-CM | POA: Diagnosis not present

## 2013-05-19 ENCOUNTER — Other Ambulatory Visit: Payer: Self-pay | Admitting: Diagnostic Neuroimaging

## 2013-05-25 ENCOUNTER — Encounter: Payer: Self-pay | Admitting: Nurse Practitioner

## 2013-05-25 ENCOUNTER — Ambulatory Visit (INDEPENDENT_AMBULATORY_CARE_PROVIDER_SITE_OTHER): Payer: Medicare Other | Admitting: Nurse Practitioner

## 2013-05-25 ENCOUNTER — Ambulatory Visit: Payer: Self-pay | Admitting: Diagnostic Neuroimaging

## 2013-05-25 VITALS — BP 135/72 | HR 88 | Temp 97.9°F | Ht 68.0 in | Wt 221.0 lb

## 2013-05-25 DIAGNOSIS — G252 Other specified forms of tremor: Secondary | ICD-10-CM

## 2013-05-25 DIAGNOSIS — G250 Essential tremor: Secondary | ICD-10-CM

## 2013-05-25 DIAGNOSIS — R5381 Other malaise: Secondary | ICD-10-CM

## 2013-05-25 DIAGNOSIS — I69921 Dysphasia following unspecified cerebrovascular disease: Secondary | ICD-10-CM

## 2013-05-25 DIAGNOSIS — I69959 Hemiplegia and hemiparesis following unspecified cerebrovascular disease affecting unspecified side: Secondary | ICD-10-CM

## 2013-05-25 DIAGNOSIS — R5383 Other fatigue: Secondary | ICD-10-CM

## 2013-05-25 DIAGNOSIS — R413 Other amnesia: Secondary | ICD-10-CM

## 2013-05-25 MED ORDER — TOPIRAMATE 50 MG PO TABS: ORAL_TABLET | ORAL | Status: DC

## 2013-05-25 MED ORDER — DONEPEZIL HCL 5 MG PO TABS: 5.0000 mg | ORAL_TABLET | Freq: Every day | ORAL | Status: DC

## 2013-05-25 NOTE — Patient Instructions (Signed)
PLAN: 1. continue TPX 50mg  BID. Refilled Rx. 2. continue stroke prevention (pradaxa, BP control, statin). 3. Start Aricept 5mg . If tolerated well, will increase to 10 mg at next visit. 4. Follow up 2 months.

## 2013-05-25 NOTE — Progress Notes (Signed)
[facility]  PATIENT: Zachary Warren DOB: April 05, 1933   HISTORY FROM: patient, wife, chart REASON FOR VISIT: 1 year follow up   HISTORICAL  CHIEF COMPLAINT:  Chief Complaint  Patient presents with  . Follow-up    HISTORY OF PRESENT ILLNESS: UPDATE 05/25/13 (LL):  Mr. Fangman comes in with his wife for yearly follow up.  No falls at home in last year and no hospitalizations.  Tremor well-controlled but has resting tremor in left hand now.  Sleeps most of the day in recliner but states he can't sleep well at night.  States he has to urinate average 6 times per night; uses urinal.  Wife finds him more confused in the last 6 months.  For example, asks for objects by the wrong name, put sour cream on his cantaloupe instead of the potato.   She says he often acts like he is looking for something but when questioned, can't tell you what it is. Wants baseline memory testing.  UPDATE 04/19/12: No new seizures. Had fever 104 and admitted to [facility] in June 2013. Also, fell out of bed on 04/16/12 with left supraorbital laceration. No LOC. Tremor well controlled.  UPDATE 08/11/11: Pacemaker placed in June. On  Pradaxa and Metoprolol. Pt has not had any seizure activity and tremor is intermittent. Denies side effects to Topamax.No new neurologic complaints. See ROS  PRIOR HPI: 79 year old white male returns today for followup. He was last seen 12/12/10. He has a history of benign essential tremor, history of a CVA in May of 1999, associated with mixed aphagia and right hemiparesis. His tremor developed in 2004. He developed seizure events in August of 2009. EEG was normal in the awake state. MRI showed nothing acute. He is currently on Topamax for both his tremor and his seizure activity tolerating without side effects and no further seizure activity. He gets no regular exercise, weight has remained stable. No falls, no assistive device, continues to complain  with extreme  fatigue. Wife  says he can sleep in the chair during the day and then sleep 6 to 8 hrs at night.  Denies early morning headache. He does not feel refreshed in the morning.  She does not know if he snores or has periods of apnea as they sleep in separate rooms. He also has a history of depression and is on meds. Recent swallowing study by PCP with out abnormalities. New dx of GERD and hiatal hernia.  REVIEW OF SYSTEMS: Full 14 system review of systems performed and notable only for:  Constitutional: planned weight loss  Cardiovascular: N/A  Ear/Nose/Throat: N/A  Skin: N/A  Eyes: N/A  Respiratory: N/A  Gastroitestinal: N/A  Hematology/Lymphatic: N/A  Endocrine: N/A Musculoskeletal:N/A  Allergy/Immunology: N/A  Neurological: memory loss, confusion Psychiatric: too much sleep, decreased energy.   ALLERGIES: Allergies  Allergen Reactions  . Ramipril Other (See Comments)     cough    HOME MEDICATIONS: Outpatient Prescriptions Prior to Visit  Medication Sig Dispense Refill  . amiodarone (PACERONE) 200 MG tablet Take 100 mg by mouth daily.       Marland Kitchen atorvastatin (LIPITOR) 40 MG tablet Take 40 mg by mouth daily.      . dabigatran (PRADAXA) 150 MG CAPS Take 150 mg by mouth every 12 (twelve) hours.        Marland Kitchen FLUoxetine (PROZAC) 40 MG capsule Take 40 mg by mouth daily.        . furosemide (LASIX) 40 MG tablet Take 40 mg by  mouth daily.        . Lutein 20 MG CAPS Take by mouth.        . Multiple Vitamins-Minerals (MULTIVITAMIN WITH MINERALS) tablet Take 1 tablet by mouth daily. Chewable flinstones      . omeprazole (PRILOSEC OTC) 20 MG tablet Take 20 mg by mouth 2 (two) times daily.        . potassium chloride SA (K-DUR,KLOR-CON) 20 MEQ tablet Take 20 mEq by mouth 2 (two) times daily.        Marland Kitchen topiramate (TOPAMAX) 50 MG tablet TAKE 1 TABLET BY MOUTH TWICE DAILY  60 tablet  0   No facility-administered medications prior to visit.    PAST MEDICAL HISTORY: Past Medical History  Diagnosis Date  . CVA  (cerebral infarction) 1999  . DDD (degenerative disc disease), cervical   . Memory impairment   . Persistent atrial fibrillation     on pradaxa  . Coronary artery disease     s/p 5v CABG 2002  . Depression   . GERD (gastroesophageal reflux disease)   . Hyperlipidemia   . Hypertension   . PVD (peripheral vascular disease)   . 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 OSA (obstructive sleep apnea)     Recently diagnosed though pt. has not had CPAP trial  . Seizures   . Pacemaker   . Prostate cancer 2006    s/p XRT    PAST SURGICAL HISTORY: Past Surgical History  Procedure Laterality Date  . Coronary artery bypass graft  2002  . Cea  1999  . Pacemaker insertion      implanted by JA 6/12    FAMILY HISTORY: Family History  Problem Relation Age of Onset  . Ovarian cancer    . Pancreatic cancer    . Lung cancer    . Cancer Mother     ovarian  . Ovarian cancer Mother   . Cancer Father     pancreatic  . Pancreatic cancer Father   . Cancer Brother     lung    SOCIAL HISTORY: History   Social History  . Marital Status: Married    Spouse Name: Aurea Graff    Number of Children: 3  . Years of Education: N/A   Occupational History  . RETIRED     [employer] [place]   Social History Main Topics  . Smoking status: Former Smoker -- 2.00 packs/day for 48 years    Types: Cigarettes    Quit date: 03/09/1998  . Smokeless tobacco: Never Used  . Alcohol Use: No     Comment: quit: 1999  . Drug Use: No  . Sexual Activity: No   Other Topics Concern  . Not on file   Social History Narrative   Pt lives in [place] with spouse.  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.   Caffeine Use: once daily   Married 20 years     PHYSICAL EXAM  Filed Vitals:   05/25/13 0910  BP: 135/72  Pulse: 88  Temp: 97.9 F (36.6 C)  TempSrc: Oral  Height: 5\' 8"  (1.727 m)  Weight: 221 lb (100.245 kg)   Body mass index is 33.61 kg/(m^2).  Generalized: In no acute  distress, pleasant elderly Caucasian male.  Neck: Supple, no carotid bruits   Cardiac: Regular rate rhythm, no murmur   Pulmonary: Clear to auscultation bilaterally   Musculoskeletal: No deformity   Neurologic Exam  Mental Status: Awake, alert. MILD  EXP APHASIA. DECR FLUENCY. MMSE 19/30 WITH DEFICITS IN TIME (MONTH, DATE); ATTENTION AND CALCULATION; RECALL 0/3; REPETITION; COMPREHENSION.  AFT 2 (NORMAL 12+) CLOCK DRAWING 4/4. Cranial Nerves: Pupils are equal and reactive to light.  Visual fields are full to confrontation.  Conjugate eye movements are full and symmetric.  Facial sensation and strength are symmetric.  Hearing is intact.  Palate elevated symmetrically and uvula is midline.  Shoulder shrug is symmetric.  Tongue is midline. Motor: POSTURAL AND ACTION TREMOR OF BUE. RESTING TREMOR OF LEFT HAND WITH PILL-ROLLING QUALITY.  MILD RIGHT HEMIPARESIS (ARM).  SLOW FINGER TAPS. SLOW TOE TAPS. Sensory: Intact and symmetric to light touch. Coordination: No ataxia or dysmetria on finger-nose or rapid alternating movement testing. Gait and Station: SLOW WIDE-based gait.  TURNS WITH SMALL STEPS.   Reflexes: Deep tendon reflexes in the upper and lower extremity are present and symmetric.  DIAGNOSTIC DATA (LABS, IMAGING, TESTING) - I reviewed patient records, labs, notes, testing and imaging myself where available.   ASSESSMENT AND PLAN 79 year old male with history of left brain stroke, right hemiparesis, partial and generalized seizures, and essential tremor. No seizure since 2009.  Increased confusion and short-term memory loss.  PLAN: 1. continue TPX 50mg  BID. Refilled Rx. 2. continue stroke prevention (pradaxa, BP control, statin). 3. Start Aricept 5mg . If tolerated well, will increase to 10 mg at next visit. 4. Follow up 2 months.  Meds ordered this encounter  Medications  . DISCONTD: omeprazole (PRILOSEC) 20 MG capsule    Sig: Take 1 capsule by mouth daily.  Marland Kitchen topiramate  (TOPAMAX) 50 MG tablet    Sig: TAKE 1 TABLET BY MOUTH TWICE DAILY    Dispense:  60 tablet    Refill:  11    Order Specific Question:  Supervising Provider    Answer:  Joycelyn Schmid R [[id number]]  . donepezil (ARICEPT) 5 MG tablet    Sig: Take 1 tablet (5 mg total) by mouth at bedtime.    Dispense:  30 tablet    Refill:  1    Order Specific Question:  Supervising Provider    Answer:  Suanne Marker [[id number]]     Alanda Colton NP-C 05/25/2013, 11:14 AM  [facility] [address] [place], [place] [address] [phone]

## 2013-05-25 NOTE — Progress Notes (Signed)
I reviewed note and agree with plan.   Suanne Marker, MD 05/25/2013, 5:39 PM Certified in Neurology, Neurophysiology and Neuroimaging  [facility] [address] [place], [place] [address] [phone]

## 2013-05-29 ENCOUNTER — Ambulatory Visit: Payer: Self-pay | Admitting: Nurse Practitioner

## 2013-06-19 ENCOUNTER — Ambulatory Visit (INDEPENDENT_AMBULATORY_CARE_PROVIDER_SITE_OTHER): Payer: Medicare Other | Admitting: *Deleted

## 2013-06-19 DIAGNOSIS — I4891 Unspecified atrial fibrillation: Secondary | ICD-10-CM | POA: Diagnosis not present

## 2013-06-19 DIAGNOSIS — I495 Sick sinus syndrome: Secondary | ICD-10-CM | POA: Diagnosis not present

## 2013-06-26 LAB — REMOTE PACEMAKER DEVICE
AL IMPEDENCE PM: 523 Ohm
ATRIAL PACING PM: 66

## 2013-07-05 DIAGNOSIS — Z23 Encounter for immunization: Secondary | ICD-10-CM | POA: Diagnosis not present

## 2013-07-18 ENCOUNTER — Other Ambulatory Visit: Payer: Self-pay | Admitting: Cardiology

## 2013-07-19 ENCOUNTER — Other Ambulatory Visit: Payer: Self-pay | Admitting: Cardiology

## 2013-07-19 MED ORDER — DABIGATRAN ETEXILATE MESYLATE 150 MG PO CAPS: 150.0000 mg | ORAL_CAPSULE | Freq: Two times a day (BID) | ORAL | Status: DC

## 2013-07-19 NOTE — Telephone Encounter (Signed)
New message   Out of prodaxia--pls call in to [facility] [phone].  Do we have samples

## 2013-07-25 ENCOUNTER — Ambulatory Visit: Payer: Medicare Other | Admitting: Nurse Practitioner

## 2013-07-26 ENCOUNTER — Ambulatory Visit (INDEPENDENT_AMBULATORY_CARE_PROVIDER_SITE_OTHER): Payer: Medicare Other | Admitting: Internal Medicine

## 2013-07-26 ENCOUNTER — Encounter: Payer: Self-pay | Admitting: Internal Medicine

## 2013-07-26 VITALS — BP 103/68 | HR 104 | Ht 69.0 in | Wt 216.0 lb

## 2013-07-26 DIAGNOSIS — I4891 Unspecified atrial fibrillation: Secondary | ICD-10-CM | POA: Diagnosis not present

## 2013-07-26 DIAGNOSIS — I495 Sick sinus syndrome: Secondary | ICD-10-CM | POA: Diagnosis not present

## 2013-07-26 LAB — PACEMAKER DEVICE OBSERVATION
AL IMPEDENCE PM: 554 Ohm
ATRIAL PACING PM: 52
BRDY-0004RV: 130 {beats}/min
RV LEAD IMPEDENCE PM: 425 Ohm
VENTRICULAR PACING PM: 0

## 2013-07-26 LAB — CBC WITH DIFFERENTIAL/PLATELET
Basophils Relative: 0.2 % (ref 0.0–3.0)
Eosinophils Relative: 1 % (ref 0.0–5.0)
HCT: 40.8 % (ref 39.0–52.0)
Hemoglobin: 13.5 g/dL (ref 13.0–17.0)
Lymphs Abs: 1.1 10*3/uL (ref 0.7–4.0)
MCV: 91.3 fl (ref 78.0–100.0)
Monocytes Absolute: 0.8 10*3/uL (ref 0.1–1.0)
Neutro Abs: 5.3 10*3/uL (ref 1.4–7.7)
RBC: 4.47 Mil/uL (ref 4.22–5.81)
WBC: 7.3 10*3/uL (ref 4.5–10.5)

## 2013-07-26 LAB — BASIC METABOLIC PANEL
BUN: 23 mg/dL (ref 6–23)
Chloride: 107 mEq/L (ref 96–112)
Potassium: 3.8 mEq/L (ref 3.5–5.1)

## 2013-07-26 LAB — TSH: TSH: 1.46 u[IU]/mL (ref 0.35–5.50)

## 2013-07-26 LAB — T4, FREE: Free T4: 1.09 ng/dL (ref 0.60–1.60)

## 2013-07-26 MED ORDER — AMIODARONE HCL 200 MG PO TABS: 200.0000 mg | ORAL_TABLET | Freq: Two times a day (BID) | ORAL | Status: DC

## 2013-07-26 NOTE — Patient Instructions (Signed)
Your physician recommends that you schedule a follow-up appointment in: 3 weeks for a nurse room EKG---if still in afib will need to be set up for a Cardioversion with Dr Anne Fu   Your physician has recommended you make the following change in your medication:  1) Increase Amiodarone to 200mg  twice daily  Your physician recommends that you return for lab work today: liver/bmp/tsh/t4

## 2013-07-27 ENCOUNTER — Telehealth: Payer: Self-pay | Admitting: Internal Medicine

## 2013-07-27 DIAGNOSIS — R82998 Other abnormal findings in urine: Secondary | ICD-10-CM | POA: Diagnosis not present

## 2013-07-27 NOTE — Telephone Encounter (Signed)
Spoke with patients wife and this apparently has been going on for 3 days. There was no mention of it yesterday and the husband told her when they got home.  I discussed with Dr Johney Frame and he needs to follow up with his PCP.  All of his labs including kidney function were good yesterday  Wife aware  Labs faxed to PCP

## 2013-07-27 NOTE — Telephone Encounter (Signed)
New problem     Pt's wife called in pt's urine is very dark almost as dark as coffee.      She would like a call back she is very concerned .

## 2013-07-27 NOTE — Telephone Encounter (Signed)
Routed to Eli Phillips, RN

## 2013-08-06 NOTE — Progress Notes (Signed)
PCP: Georgann Housekeeper, MD Primary Cardiologist:  Dr Anne Fu  The patient presents today for routine electrophysiology followup.  Recent PPM remote interrogated revealed increase in afib burden.  He is therefore brought in for further evaluation.  He has difficulty providing history.  His wife feels that he may be a little more fatigued in afib.  Today, he denies symptoms of palpitations, chest pain, shortness of breath, orthopnea, PND, lower extremity edema, presyncope, syncope, or neurologic sequela.  The patient feels that he is tolerating medications without difficulties and is otherwise without complaint today.   Past Medical History  Diagnosis Date  . CVA (cerebral infarction) 1999  . DDD (degenerative disc disease), cervical   . Memory impairment   . Persistent atrial fibrillation     on pradaxa  . Coronary artery disease     s/p 5v CABG 2002  . Depression   . GERD (gastroesophageal reflux disease)   . Hyperlipidemia   . Hypertension   . PVD (peripheral vascular disease)   . 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 OSA (obstructive sleep apnea)     Recently diagnosed though pt. has not had CPAP trial  . Seizures   . Pacemaker   . Prostate cancer 2006    s/p XRT   Past Surgical History  Procedure Laterality Date  . Coronary artery bypass graft  2002  . Cea  1999  . Pacemaker insertion      implanted by [facility] 6/12    Current Outpatient Prescriptions  Medication Sig Dispense Refill  . amiodarone (PACERONE) 200 MG tablet Take 1 tablet (200 mg total) by mouth 2 (two) times daily.  60 tablet  11  . atorvastatin (LIPITOR) 40 MG tablet Take 40 mg by mouth daily.      . dabigatran (PRADAXA) 150 MG CAPS capsule Take 1 capsule (150 mg total) by mouth every 12 (twelve) hours.  60 capsule  6  . FLUoxetine (PROZAC) 40 MG capsule Take 40 mg by mouth daily.        . furosemide (LASIX) 40 MG tablet Take 40 mg by mouth daily.        . Lutein 20 MG CAPS Take by mouth.        .  Multiple Vitamins-Minerals (MULTIVITAMIN WITH MINERALS) tablet Take 1 tablet by mouth daily. Chewable flinstones      . omeprazole (PRILOSEC OTC) 20 MG tablet Take 20 mg by mouth 2 (two) times daily.        . potassium chloride SA (K-DUR,KLOR-CON) 20 MEQ tablet Take 20 mEq by mouth 2 (two) times daily.        Marland Kitchen topiramate (TOPAMAX) 50 MG tablet TAKE 1 TABLET BY MOUTH TWICE DAILY  60 tablet  11   No current facility-administered medications for this visit.    Allergies  Allergen Reactions  . Ramipril Other (See Comments)     cough    History   Social History  . Marital Status: Married    Spouse Name: Aurea Graff    Number of Children: 3  . Years of Education: N/A   Occupational History  . RETIRED     [employer] [place]   Social History Main Topics  . Smoking status: Former Smoker -- 2.00 packs/day for 48 years    Types: Cigarettes    Quit date: 03/09/1998  . Smokeless tobacco: Never Used  . Alcohol Use: No     Comment: quit: 1999  . Drug Use: No  .  Sexual Activity: No   Other Topics Concern  . Not on file   Social History Narrative   Pt lives in [place] with spouse.  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.   Caffeine Use: once daily   Married 20 years    Family History  Problem Relation Age of Onset  . Ovarian cancer    . Pancreatic cancer    . Lung cancer    . Cancer Mother     ovarian  . Ovarian cancer Mother   . Cancer Father     pancreatic  . Pancreatic cancer Father   . Cancer Brother     lung    Physical Exam: Filed Vitals:   07/26/13 1122  BP: 103/68  Pulse: 104  Height: 5\' 9"  (1.753 m)  Weight: 216 lb (97.977 kg)    GEN- The patient is well appearing, alert but confused, difficulty with expressive aphasia Head- normocephalic, atraumatic Eyes-  Sclera clear, conjunctiva pink Ears- hearing intact Oropharynx- clear Neck- supple, no JVP Lymph- no cervical lymphadenopathy Lungs- Clear to ausculation bilaterally, normal work of  breathing Chest- pacemaker pocket is well healed Heart- irregular rate and rhythm, no murmurs, rubs or gallops, PMI not laterally displaced GI- soft, NT, ND, + BS Extremities- no clubbing, cyanosis, or edema Very flat affect  Pacemaker interrogation- reviewed in detail today,  See PACEART report ekg today reveals afib with V pacing  Assessment and Plan:  1. Tachycardia/ bradycardia syndrome Normal pacemaker function See Pace Art report No changes today  2. Afib Pacemaker interrogation reveals that he has been in persistent afib since July.  I am not certain as to how symptomatic he is at this time.  I think that we should pursue sinus rhythm and then reassess.  I will increase amiodarone to 200mg  BID .  He will return in 3 weeks. If he remains in afib at that time then he should have cardioversion performed by Dr Anne Fu.  We will check LFTs/TFTs with amiodarone today.  Check CBC and BMET on Pradaxa.

## 2013-08-10 ENCOUNTER — Encounter: Payer: Self-pay | Admitting: Internal Medicine

## 2013-08-14 DIAGNOSIS — I10 Essential (primary) hypertension: Secondary | ICD-10-CM | POA: Diagnosis not present

## 2013-08-14 DIAGNOSIS — I4891 Unspecified atrial fibrillation: Secondary | ICD-10-CM | POA: Diagnosis not present

## 2013-08-14 DIAGNOSIS — E782 Mixed hyperlipidemia: Secondary | ICD-10-CM | POA: Diagnosis not present

## 2013-08-14 DIAGNOSIS — K219 Gastro-esophageal reflux disease without esophagitis: Secondary | ICD-10-CM | POA: Diagnosis not present

## 2013-08-16 ENCOUNTER — Ambulatory Visit (INDEPENDENT_AMBULATORY_CARE_PROVIDER_SITE_OTHER): Payer: Medicare Other | Admitting: *Deleted

## 2013-08-16 ENCOUNTER — Encounter: Payer: Self-pay | Admitting: *Deleted

## 2013-08-16 VITALS — BP 136/84 | HR 88 | Ht 70.0 in | Wt 219.5 lb

## 2013-08-16 DIAGNOSIS — I4891 Unspecified atrial fibrillation: Secondary | ICD-10-CM | POA: Diagnosis not present

## 2013-08-16 NOTE — Progress Notes (Signed)
Pt in for an EKG done per nurse read for Dr. Johney Frame MD. A- fibrillation rate 88 beats/minute. BP right arm 136/84. PT TOOK Amiodarone 200 mg MEDICATIONS this AM Pt is scheduled for Cardioversion with DR. Skains on Monday 08/21/13 at 11:30 AM; pt to arrive to [place] at 10:00 AM.  Pt had BMP and CBC on 07/26/13.   I will call pt with Cardioversion instructions this PM as requested per pt.and wife.

## 2013-08-21 ENCOUNTER — Encounter (HOSPITAL_COMMUNITY): Payer: Self-pay | Admitting: Certified Registered Nurse Anesthetist

## 2013-08-21 ENCOUNTER — Encounter (HOSPITAL_COMMUNITY)
Admission: RE | Disposition: A | Discharge status: Home / Self Care | Payer: Self-pay | Source: Ambulatory Visit | Attending: Cardiology

## 2013-08-21 ENCOUNTER — Ambulatory Visit (HOSPITAL_COMMUNITY): Payer: Medicare Other | Admitting: Certified Registered Nurse Anesthetist

## 2013-08-21 ENCOUNTER — Encounter (HOSPITAL_COMMUNITY): Payer: Medicare Other | Admitting: Certified Registered Nurse Anesthetist

## 2013-08-21 ENCOUNTER — Ambulatory Visit (HOSPITAL_COMMUNITY)
Admission: RE | Admit: 2013-08-21 | Discharge: 2013-08-21 | Disposition: A | Discharge status: Home / Self Care | Payer: Medicare Other | Source: Ambulatory Visit | Attending: Cardiology | Admitting: Cardiology

## 2013-08-21 DIAGNOSIS — I2510 Atherosclerotic heart disease of native coronary artery without angina pectoris: Secondary | ICD-10-CM | POA: Diagnosis not present

## 2013-08-21 DIAGNOSIS — K219 Gastro-esophageal reflux disease without esophagitis: Secondary | ICD-10-CM | POA: Diagnosis not present

## 2013-08-21 DIAGNOSIS — I4891 Unspecified atrial fibrillation: Secondary | ICD-10-CM | POA: Insufficient documentation

## 2013-08-21 DIAGNOSIS — C61 Malignant neoplasm of prostate: Secondary | ICD-10-CM | POA: Diagnosis not present

## 2013-08-21 DIAGNOSIS — Z951 Presence of aortocoronary bypass graft: Secondary | ICD-10-CM | POA: Diagnosis not present

## 2013-08-21 DIAGNOSIS — E785 Hyperlipidemia, unspecified: Secondary | ICD-10-CM | POA: Diagnosis not present

## 2013-08-21 DIAGNOSIS — I10 Essential (primary) hypertension: Secondary | ICD-10-CM | POA: Diagnosis not present

## 2013-08-21 DIAGNOSIS — I495 Sick sinus syndrome: Secondary | ICD-10-CM | POA: Diagnosis not present

## 2013-08-21 DIAGNOSIS — Z950 Presence of cardiac pacemaker: Secondary | ICD-10-CM | POA: Insufficient documentation

## 2013-08-21 DIAGNOSIS — I69928 Other speech and language deficits following unspecified cerebrovascular disease: Secondary | ICD-10-CM | POA: Insufficient documentation

## 2013-08-21 HISTORY — PX: CARDIOVERSION: SHX1299

## 2013-08-21 SURGERY — CARDIOVERSION
Anesthesia: Monitor Anesthesia Care

## 2013-08-21 MED ORDER — PROPOFOL 10 MG/ML IV BOLUS
INTRAVENOUS | Status: DC | PRN
  Administered 2013-08-21: 80 mg via INTRAVENOUS

## 2013-08-21 MED ORDER — SODIUM CHLORIDE 0.9 % IV SOLN
INTRAVENOUS | Status: DC | PRN
  Administered 2013-08-21: 12:00:00 via INTRAVENOUS

## 2013-08-21 MED ORDER — LIDOCAINE HCL (CARDIAC) 20 MG/ML IV SOLN
INTRAVENOUS | Status: DC | PRN
  Administered 2013-08-21: 40 mg via INTRAVENOUS

## 2013-08-21 MED ORDER — SODIUM CHLORIDE 0.9 % IV SOLN
INTRAVENOUS | Status: DC
  Administered 2013-08-21: 500 mL via INTRAVENOUS

## 2013-08-21 NOTE — Anesthesia Preprocedure Evaluation (Addendum)
Anesthesia Evaluation  Patient identified by MRN, date of birth, ID band Patient awake    Reviewed: Allergy & Precautions, H&P , NPO status , Patient's Chart, lab work & pertinent test results  History of Anesthesia Complications Negative for: history of anesthetic complications  Airway Mallampati: II TM Distance: >3 FB Neck ROM: Full    Dental  (+) Teeth Intact and Dental Advisory Given   Pulmonary sleep apnea ,  + rhonchi   + decreased breath sounds      Cardiovascular hypertension, + CAD and + Peripheral Vascular Disease + dysrhythmias Atrial Fibrillation + pacemaker Rhythm:Irregular Rate:Normal  Hx Tachy-brady syndrome, s/p PPM 6/12 Echo 08/22/2010 EF 45-50%, mild MR s/p CABG 5v 2002 s/p CEA 1999   Neuro/Psych Seizures -,  PSYCHIATRIC DISORDERS Depression CVA, residual difficulty with speech and mild confusion CVA, Residual Symptoms    GI/Hepatic Neg liver ROS, GERD-  Medicated and Controlled,  Endo/Other  negative endocrine ROS  Renal/GU Cr= 1.5      Musculoskeletal   Abdominal   Peds  Hematology negative hematology ROS (+)   Anesthesia Other Findings   Reproductive/Obstetrics                        Anesthesia Physical Anesthesia Plan  ASA: III  Anesthesia Plan: General   Post-op Pain Management:    Induction: Intravenous  Airway Management Planned: Mask and Natural Airway  Additional Equipment:   Intra-op Plan:   Post-operative Plan:   Informed Consent: I have reviewed the patients History and Physical, chart, labs and discussed the procedure including the risks, benefits and alternatives for the proposed anesthesia with the patient or authorized representative who has indicated his/her understanding and acceptance.   Dental advisory given  Plan Discussed with: CRNA, Anesthesiologist and Surgeon  Anesthesia Plan Comments:         Anesthesia Quick Evaluation

## 2013-08-21 NOTE — Interval H&P Note (Signed)
History and Physical Interval Note:  08/21/2013 12:02 PM  Zachary Warren  has presented today for surgery, with the diagnosis of afib  The various methods of treatment have been discussed with the patient and family. After consideration of risks, benefits and other options for treatment, the patient has consented to  Procedure(s): CARDIOVERSION (N/A) as a surgical intervention .  The patient's history has been reviewed, patient examined, no change in status, stable for surgery.  I have reviewed the patient's chart and labs.  Questions were answered to the patient's satisfaction.     Jeryl Umholtz

## 2013-08-21 NOTE — CV Procedure (Signed)
    Electrical Cardioversion Procedure Note Zachary Warren [id number] 1932/11/17  Procedure: Electrical Cardioversion Indications:  Atrial Fibrillation  Time Out: Verified patient identification, verified procedure,medications/allergies/relevent history reviewed, required imaging and test results available.  Performed  Procedure Details  The patient was NPO after midnight. Anesthesia was administered at the beside  by Dr. Massage with 80mg  of propofol.  Cardioversion was performed with synchronized biphasic defibrillation via AP pads with 150 joules.  1 attempt(s) were performed.  The patient converted to normal sinus rhythm. The patient tolerated the procedure well   IMPRESSION:  Successful cardioversion of atrial fibrillation. NSR with PAC on tele. Will check 12 lead ECG. Consider pacer interrogation.     SKAINS, MARK 08/21/2013, 12:08 PM

## 2013-08-21 NOTE — H&P (View-Only) (Signed)
PCP: HUSAIN,KARRAR, MD Primary Cardiologist:  Dr Skains  The patient presents today for routine electrophysiology followup.  Recent PPM remote interrogated revealed increase in afib burden.  He is therefore brought in for further evaluation.  He has difficulty providing history.  His wife feels that he may be a little more fatigued in afib.  Today, he denies symptoms of palpitations, chest pain, shortness of breath, orthopnea, PND, lower extremity edema, presyncope, syncope, or neurologic sequela.  The patient feels that he is tolerating medications without difficulties and is otherwise without complaint today.   Past Medical History  Diagnosis Date  . CVA (cerebral infarction) 1999  . DDD (degenerative disc disease), cervical   . Memory impairment   . Persistent atrial fibrillation     on pradaxa  . Coronary artery disease     s/p 5v CABG 2002  . Depression   . GERD (gastroesophageal reflux disease)   . Hyperlipidemia   . Hypertension   . PVD (peripheral vascular disease)   . 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 OSA (obstructive sleep apnea)     Recently diagnosed though pt. has not had CPAP trial  . Seizures   . Pacemaker   . Prostate cancer 2006    s/p XRT   Past Surgical History  Procedure Laterality Date  . Coronary artery bypass graft  2002  . Cea  1999  . Pacemaker insertion      implanted by [facility] 6/12    Current Outpatient Prescriptions  Medication Sig Dispense Refill  . amiodarone (PACERONE) 200 MG tablet Take 1 tablet (200 mg total) by mouth 2 (two) times daily.  60 tablet  11  . atorvastatin (LIPITOR) 40 MG tablet Take 40 mg by mouth daily.      . dabigatran (PRADAXA) 150 MG CAPS capsule Take 1 capsule (150 mg total) by mouth every 12 (twelve) hours.  60 capsule  6  . FLUoxetine (PROZAC) 40 MG capsule Take 40 mg by mouth daily.        . furosemide (LASIX) 40 MG tablet Take 40 mg by mouth daily.        . Lutein 20 MG CAPS Take by mouth.        .  Multiple Vitamins-Minerals (MULTIVITAMIN WITH MINERALS) tablet Take 1 tablet by mouth daily. Chewable flinstones      . omeprazole (PRILOSEC OTC) 20 MG tablet Take 20 mg by mouth 2 (two) times daily.        . potassium chloride SA (K-DUR,KLOR-CON) 20 MEQ tablet Take 20 mEq by mouth 2 (two) times daily.        . topiramate (TOPAMAX) 50 MG tablet TAKE 1 TABLET BY MOUTH TWICE DAILY  60 tablet  11   No current facility-administered medications for this visit.    Allergies  Allergen Reactions  . Ramipril Other (See Comments)     cough    History   Social History  . Marital Status: Married    Spouse Name: Joan    Number of Children: 3  . Years of Education: N/A   Occupational History  . RETIRED     [employer] [place]   Social History Main Topics  . Smoking status: Former Smoker -- 2.00 packs/day for 48 years    Types: Cigarettes    Quit date: 03/09/1998  . Smokeless tobacco: Never Used  . Alcohol Use: No     Comment: quit: 1999  . Drug Use: No  .   Sexual Activity: No   Other Topics Concern  . Not on file   Social History Narrative   Pt lives in [place] with spouse.  Retired.  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   Caffeine Use: once daily   Married 20 years    Family History  Problem Relation Age of Onset  . Ovarian cancer    . Pancreatic cancer    . Lung cancer    . Cancer Mother     ovarian  . Ovarian cancer Mother   . Cancer Father     pancreatic  . Pancreatic cancer Father   . Cancer Brother     lung    Physical Exam: Filed Vitals:   07/26/13 1122  BP: 103/68  Pulse: 104  Height: 5' 9" (1.753 m)  Weight: 216 lb (97.977 kg)    GEN- The patient is well appearing, alert but confused, difficulty with expressive aphasia Head- normocephalic, atraumatic Eyes-  Sclera clear, conjunctiva pink Ears- hearing intact Oropharynx- clear Neck- supple, no JVP Lymph- no cervical lymphadenopathy Lungs- Clear to ausculation bilaterally, normal work of  breathing Chest- pacemaker pocket is well healed Heart- irregular rate and rhythm, no murmurs, rubs or gallops, PMI not laterally displaced GI- soft, NT, ND, + BS Extremities- no clubbing, cyanosis, or edema Very flat affect  Pacemaker interrogation- reviewed in detail today,  See PACEART report ekg today reveals afib with V pacing  Assessment and Plan:  1. Tachycardia/ bradycardia syndrome Normal pacemaker function See Pace Art report No changes today  2. Afib Pacemaker interrogation reveals that he has been in persistent afib since July.  I am not certain as to how symptomatic he is at this time.  I think that we should pursue sinus rhythm and then reassess.  I will increase amiodarone to 200mg BID .  He will return in 3 weeks. If he remains in afib at that time then he should have cardioversion performed by Dr Skains.  We will check LFTs/TFTs with amiodarone today.  Check CBC and BMET on Pradaxa.   

## 2013-08-21 NOTE — Anesthesia Postprocedure Evaluation (Signed)
  Anesthesia Post-op Note  Patient: Zachary Warren  Procedure(s) Performed: Procedure(s): CARDIOVERSION (N/A)  Patient Location: PACU and Endoscopy Unit  Anesthesia Type:General  Level of Consciousness: awake  Airway and Oxygen Therapy: Patient Spontanous Breathing  Post-op Pain: none  Post-op Assessment: Post-op Vital signs reviewed  Post-op Vital Signs: stable  Complications: No apparent anesthesia complications

## 2013-08-21 NOTE — Preoperative (Signed)
Beta Blockers   Reason not to administer Beta Blockers:Not Applicable 

## 2013-08-21 NOTE — Anesthesia Procedure Notes (Signed)
Procedure Name: MAC Date/Time: 08/21/2013 12:04 PM Performed by: Angelica Pou Pre-anesthesia Checklist: Patient identified, Patient being monitored, Emergency Drugs available, Suction available and Timeout performed Patient Re-evaluated:Patient Re-evaluated prior to inductionOxygen Delivery Method: Ambu bag Preoxygenation: Pre-oxygenation with 100% oxygen Intubation Type: IV induction Ventilation: Mask ventilation without difficulty

## 2013-08-21 NOTE — Transfer of Care (Signed)
Immediate Anesthesia Transfer of Care Note  Patient: Zachary Warren  Procedure(s) Performed: Procedure(s): CARDIOVERSION (N/A)  Patient Location: PACU  Anesthesia Type:General  Level of Consciousness: awake, patient cooperative and lethargic  Airway & Oxygen Therapy: Patient Spontanous Breathing and Patient connected to nasal cannula oxygen  Post-op Assessment: Report given to PACU RN and Post -op Vital signs reviewed and stable  Post vital signs: Reviewed and stable  Complications: No apparent anesthesia complications

## 2013-08-21 NOTE — Interval H&P Note (Signed)
History and Physical Interval Note:  08/21/2013 11:25 AM  Zachary Warren  has presented today for surgery, with the diagnosis of afib  The various methods of treatment have been discussed with the patient and family. After consideration of risks, benefits and other options for treatment, the patient has consented to  Procedure(s): CARDIOVERSION (N/A) as a surgical intervention .  The patient's history has been reviewed, patient examined, no change in status, stable for surgery.  I have reviewed the patient's chart and labs.  Questions were answered to the patient's satisfaction.     [name]

## 2013-08-22 ENCOUNTER — Encounter (HOSPITAL_COMMUNITY): Payer: Self-pay | Admitting: Cardiology

## 2013-09-15 ENCOUNTER — Ambulatory Visit (INDEPENDENT_AMBULATORY_CARE_PROVIDER_SITE_OTHER): Payer: Medicare Other | Admitting: Cardiology

## 2013-09-15 ENCOUNTER — Encounter (INDEPENDENT_AMBULATORY_CARE_PROVIDER_SITE_OTHER): Payer: Self-pay

## 2013-09-15 ENCOUNTER — Encounter: Payer: Self-pay | Admitting: Cardiology

## 2013-09-15 VITALS — BP 128/83 | HR 85 | Ht 70.0 in | Wt 214.8 lb

## 2013-09-15 DIAGNOSIS — I2510 Atherosclerotic heart disease of native coronary artery without angina pectoris: Secondary | ICD-10-CM | POA: Diagnosis not present

## 2013-09-15 DIAGNOSIS — I4891 Unspecified atrial fibrillation: Secondary | ICD-10-CM

## 2013-09-15 DIAGNOSIS — I6350 Cerebral infarction due to unspecified occlusion or stenosis of unspecified cerebral artery: Secondary | ICD-10-CM

## 2013-09-15 NOTE — Patient Instructions (Signed)
Your physician has recommended you make the following change in your medication:   1. Stop Amiodarone  Your physician recommends that you schedule a follow-up appointment in: 2 months with Dr. Anne Fu.

## 2013-09-15 NOTE — Progress Notes (Signed)
[address] [place], [place]  [address] Phone: [phone] Fax:  [phone]  Date:  09/15/2013   ID:  Zachary Warren, DOB [birth year], MRN [id number]  PCP:  Georgann Housekeeper, MD   History of Present Illness: Zachary Warren is a 79 y.o. male with a hx of coronary artery disease status post bypass in 2003,and Atrial fibrillation cardioversion, x 3. Last one on amiodarone. He's currently on anticoagulation with Pradaxa. He had pacemaker placed by Dr. Johney Frame 6/12.  His pacemaker is functioning well. He had it checked and he is being paced 96% of the time.  He was in the hospital for fever of unknown origin, as high as 104? he is doing better. He has been complaining of some fatigue. Blood pressure has been running slightly low. On 01/13/13 I decided to decrease his amiodarone to 100 mg. EKG QT interval slightly prolonged. Overall he is doing about the same as last time. Still complains about fatigue, his wife states. He spends a lot of time in his chair resting. He admitted that he does stay awake most of the night.  Has urgent BMs. Had cardioversion 11/14 with amiodarone but did not maintain sinus rhythm. EKG on 09/15/13 shows atrial fibrillation once again occasional pacing with a heart rate of 83 beats per minute. I decided to stop amiodarone.    Wt Readings from Last 3 Encounters:  09/15/13 214 lb 12.8 oz (97.433 kg)  08/16/13 219 lb 8 oz (99.565 kg)  07/26/13 216 lb (97.977 kg)     Past Medical History  Diagnosis Date  . CVA (cerebral infarction) 1999  . DDD (degenerative disc disease), cervical   . Memory impairment   . Persistent atrial fibrillation     on pradaxa  . Coronary artery disease     s/p 5v CABG 2002  . Depression   . GERD (gastroesophageal reflux disease)   . Hyperlipidemia   . Hypertension   . PVD (peripheral vascular disease)   . 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 OSA (obstructive sleep apnea)     Recently  diagnosed though pt. has not had CPAP trial  . Seizures   . Pacemaker   . Prostate cancer 2006    s/p XRT    Past Surgical History  Procedure Laterality Date  . Coronary artery bypass graft  2002  . Cea  1999  . Pacemaker insertion      implanted by [facility] 6/12  . Insert / replace / remove pacemaker    . Cardioversion N/A 08/21/2013    Procedure: CARDIOVERSION;  Surgeon: Donato Schultz, MD;  Location: [facility] ENDOSCOPY;  Service: Cardiovascular;  Laterality: N/A;    Current Outpatient Prescriptions  Medication Sig Dispense Refill  . amiodarone (PACERONE) 200 MG tablet Take 1 tablet (200 mg total) by mouth 2 (two) times daily.  60 tablet  11  . atorvastatin (LIPITOR) 40 MG tablet Take 40 mg by mouth daily.      . dabigatran (PRADAXA) 150 MG CAPS capsule Take 1 capsule (150 mg total) by mouth every 12 (twelve) hours.  60 capsule  6  . FLUoxetine (PROZAC) 40 MG capsule Take 40 mg by mouth daily.        . furosemide (LASIX) 40 MG tablet Take 40 mg by mouth daily.        . Lutein 20 MG CAPS Take 1 capsule by mouth daily.       Marland Kitchen  Multiple Vitamins-Minerals (MULTIVITAMIN WITH MINERALS) tablet Take 1 tablet by mouth daily. Chewable flinstones      . omeprazole (PRILOSEC OTC) 20 MG tablet Take 20 mg by mouth 2 (two) times daily.        . potassium chloride SA (K-DUR,KLOR-CON) 20 MEQ tablet Take 20 mEq by mouth 2 (two) times daily.        Marland Kitchen topiramate (TOPAMAX) 50 MG tablet TAKE 1 TABLET BY MOUTH TWICE DAILY  60 tablet  11   No current facility-administered medications for this visit.    Allergies:    Allergies  Allergen Reactions  . Ramipril Other (See Comments)     cough    Social History:  The patient  reports that he quit smoking about 15 years ago. His smoking use included Cigarettes. He has a 96 pack-year smoking history. He has never used smokeless tobacco. He reports that he does not drink alcohol or use illicit drugs.   ROS:  Please see the history of present illness.   Positive for  atrial fibrillation, occasional fatigue. No change after cardioversion.  PHYSICAL EXAM: VS:  BP 128/83  Pulse 85  Ht 5\' 10"  (1.778 m)  Wt 214 lb 12.8 oz (97.433 kg)  BMI 30.82 kg/m2 Well nourished, well developed, in no acute distress HEENT: normal Neck: no JVD Cardiac:  normal S1, S2; RRR; no murmur Lungs:  clear to auscultation bilaterally, no wheezing, rhonchi or rales Abd: soft, nontender, no hepatomegaly Ext: no edema Skin: warm and dry Neuro: no focal abnormalities noted  EKG:  Atrial fibrillation rate 83, occasional pacing     ASSESSMENT AND PLAN:  1. Atrial fibrillation-stop amiodarone. He is back in atrial fibrillation. He did not feel any different after cardioversion in 11/14. No need for antiarrhythmic. If he needs further rate controlling medication, we can place him on either beta blocker or calcium channel blocker. For now, stopping amiodarone. I will see him back in 2 months for followup. 2. CAD - stable, no angina  Signed, Donato Schultz, MD [facility]  09/15/2013 9:39 AM

## 2013-10-16 ENCOUNTER — Encounter: Payer: Self-pay | Admitting: Cardiology

## 2013-10-16 ENCOUNTER — Ambulatory Visit (INDEPENDENT_AMBULATORY_CARE_PROVIDER_SITE_OTHER): Payer: Medicare Other | Admitting: Cardiology

## 2013-10-16 VITALS — BP 130/68 | HR 101 | Ht 70.0 in | Wt 212.0 lb

## 2013-10-16 DIAGNOSIS — I4891 Unspecified atrial fibrillation: Secondary | ICD-10-CM | POA: Diagnosis not present

## 2013-10-16 DIAGNOSIS — E669 Obesity, unspecified: Secondary | ICD-10-CM

## 2013-10-16 DIAGNOSIS — I739 Peripheral vascular disease, unspecified: Secondary | ICD-10-CM | POA: Diagnosis not present

## 2013-10-16 DIAGNOSIS — I495 Sick sinus syndrome: Secondary | ICD-10-CM | POA: Diagnosis not present

## 2013-10-16 DIAGNOSIS — Z950 Presence of cardiac pacemaker: Secondary | ICD-10-CM

## 2013-10-16 DIAGNOSIS — I2510 Atherosclerotic heart disease of native coronary artery without angina pectoris: Secondary | ICD-10-CM | POA: Diagnosis not present

## 2013-10-16 MED ORDER — DILTIAZEM HCL ER COATED BEADS 120 MG PO CP24: 120.0000 mg | ORAL_CAPSULE | Freq: Every day | ORAL | Status: DC

## 2013-10-16 NOTE — Progress Notes (Signed)
[facility]. [address] [place], [place]  [address] Phone: [phone] Fax:  [phone]  Date:  10/16/2013   ID:  Zachary Warren, DOB 1933/02/12, MRN [id number]  PCP:  Wenda Low, MD   History of Present Illness: Zachary Warren is a 80 y.o. male with a hx of coronary artery disease status post bypass in 2003,and Atrial fibrillation cardioversion, x 3. Last one on amiodarone. He's currently on anticoagulation with Pradaxa. He had pacemaker placed by Dr. Rayann Heman 6/12.  His pacemaker is functioning well. He had it checked and he is being paced 96% of the time.  He was in the hospital for fever of unknown origin, as high as 104? he is doing better. He has been complaining of some fatigue. Blood pressure has been running slightly low.  On 01/13/13 I decided to decrease his amiodarone to 100 mg. EKG QT interval slightly prolonged. Overall he is doing about the same as last time. Still complains about fatigue, his wife states. He spends a lot of time in his chair resting. He admitted that he does stay awake most of the night.  Had cardioversion 11/14 with amiodarone but did not maintain sinus rhythm. EKG on 09/15/13 shows atrial fibrillation once again occasional pacing with a heart rate of 83 beats per minute. I decided to stop amiodarone. He did not feel any different cardioversion.  On 10/16/13 he maintained his atrial fibrillation but his heart rate is now 101 beats per minute. I decided to start him on diltiazem CD 120 mg. He reports no syncope, no fevers, no chills, no significant shortness of breath. He has been battling a cough with mucous production    Wt Readings from Last 3 Encounters:  10/16/13 212 lb (96.163 kg)  09/15/13 214 lb 12.8 oz (97.433 kg)  08/16/13 219 lb 8 oz (99.565 kg)     Past Medical History  Diagnosis Date  . CVA (cerebral infarction) 1999  . DDD (degenerative disc disease), cervical   . Memory impairment   . Persistent atrial  fibrillation     on pradaxa  . Coronary artery disease     s/p 5v CABG 2002  . Depression   . GERD (gastroesophageal reflux disease)   . Hyperlipidemia   . Hypertension   . PVD (peripheral vascular disease)   . 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 OSA (obstructive sleep apnea)     Recently diagnosed though pt. has not had CPAP trial  . Seizures   . Pacemaker   . Prostate cancer 2006    s/p XRT    Past Surgical History  Procedure Laterality Date  . Coronary artery bypass graft  2002  . Cea  1999  . Pacemaker insertion      implanted by JA 6/12  . Insert / replace / remove pacemaker    . Cardioversion N/A 08/21/2013    Procedure: CARDIOVERSION;  Surgeon: Candee Furbish, MD;  Location: [facility] ENDOSCOPY;  Service: Cardiovascular;  Laterality: N/A;    Current Outpatient Prescriptions  Medication Sig Dispense Refill  . atorvastatin (LIPITOR) 40 MG tablet Take 40 mg by mouth daily.      . dabigatran (PRADAXA) 150 MG CAPS capsule Take 1 capsule (150 mg total) by mouth every 12 (twelve) hours.  60 capsule  6  . FLUoxetine (PROZAC) 40 MG capsule Take 40 mg by mouth daily.        Marland Kitchen  furosemide (LASIX) 40 MG tablet Take 40 mg by mouth daily.        . Lutein 20 MG CAPS Take 1 capsule by mouth daily.       . Multiple Vitamins-Minerals (MULTIVITAMIN WITH MINERALS) tablet Take 1 tablet by mouth daily. Chewable flinstones      . omeprazole (PRILOSEC OTC) 20 MG tablet Take 20 mg by mouth 2 (two) times daily.        . potassium chloride SA (K-DUR,KLOR-CON) 20 MEQ tablet Take 20 mEq by mouth 2 (two) times daily.        Marland Kitchen topiramate (TOPAMAX) 50 MG tablet TAKE 1 TABLET BY MOUTH TWICE DAILY  60 tablet  11   No current facility-administered medications for this visit.    Allergies:    Allergies  Allergen Reactions  . Ramipril Other (See Comments)     cough    Social History:  The patient  reports that he quit smoking about 15 years ago. His smoking use included Cigarettes. He has a  96 pack-year smoking history. He has never used smokeless tobacco. He reports that he does not drink alcohol or use illicit drugs.   ROS:  Please see the history of present illness.   Positive for atrial fibrillation, occasional fatigue. No change after cardioversion.  PHYSICAL EXAM: VS:  BP 130/68  Pulse 101  Ht 5\' 10"  (1.778 m)  Wt 212 lb (96.163 kg)  BMI 30.42 kg/m2 Well nourished, well developed, in no acute distress HEENT: normal Neck: no JVD Cardiac:  Irregularly irregular, minimally tachycardic; no murmur Lungs:  Upper airway congestion noted Abd: soft, nontender, no hepatomegalyOverweight Ext: no edema Skin: warm and dry Neuro: no focal abnormalities noted  EKG:  Atrial fibrillation rate 83, occasional pacing     ASSESSMENT AND PLAN:  1. Atrial fibrillation- He is in persistent atrial fibrillation. He did not feel any different after cardioversion in 11/14. No need for antiarrhythmic. Hence, we stopped his amiodarone in December of 2015. His heart rate currently is approximately 101 on his EKG. I would like to start him on diltiazem CD 120 mg once a day for improved rate control. I have counseled him on potential side effects, edema. He has not been on diltiazem in the past. He has been on Toprol in the past and I do believe that we have stopped this because of potential fatigue. 2. CAD - stable, no angina 3. Obesity-Dr. Lysle Rubens encourage weight loss and he is doing quite well losing approximately 9 pounds since last visit. 4. Upper airway congestion/mucous production-encouraged Mucinex. Do not use decongestant for him. 5. We'll see him back in 6 months.  Signed,         Candee Furbish, MD [facility]  10/16/2013 12:13 PM

## 2013-10-16 NOTE — Patient Instructions (Addendum)
Your physician has recommended you make the following change in your medication:   1. Start Diltiazem CD 120mg  once daily  Your physician wants you to follow-up in: 6 months follow-up with Dr. Marlou Porch. You will receive a reminder letter in the mail two months in advance. If you don't receive a letter, please call our office to schedule the follow-up appointment.

## 2013-10-27 ENCOUNTER — Ambulatory Visit (INDEPENDENT_AMBULATORY_CARE_PROVIDER_SITE_OTHER): Payer: Medicare Other | Admitting: *Deleted

## 2013-10-27 DIAGNOSIS — I4891 Unspecified atrial fibrillation: Secondary | ICD-10-CM | POA: Diagnosis not present

## 2013-10-27 DIAGNOSIS — I495 Sick sinus syndrome: Secondary | ICD-10-CM | POA: Diagnosis not present

## 2013-10-30 ENCOUNTER — Encounter: Payer: Self-pay | Admitting: Internal Medicine

## 2013-10-30 LAB — MDC_IDC_ENUM_SESS_TYPE_REMOTE
Battery Remaining Longevity: 10.5
Battery Voltage: 2.79 V
Brady Statistic AP VP Percent: 0.2 %
Brady Statistic AS VP Percent: 0.1 %
Brady Statistic AS VS Percent: 97.7 %
Lead Channel Impedance Value: 419 Ohm
Lead Channel Pacing Threshold Amplitude: 1 V
Lead Channel Pacing Threshold Pulse Width: 0.4 ms
Lead Channel Pacing Threshold Pulse Width: 0.4 ms
Lead Channel Sensing Intrinsic Amplitude: 2.8 mV
Lead Channel Sensing Intrinsic Amplitude: 8 mV
Lead Channel Setting Pacing Amplitude: 2 V
Lead Channel Setting Pacing Amplitude: 2.5 V
Lead Channel Setting Pacing Pulse Width: 0.4 ms
Lead Channel Setting Sensing Sensitivity: 4 mV
MDC IDC MSMT BATTERY IMPEDANCE: 182 Ohm
MDC IDC MSMT LEADCHNL RA IMPEDANCE VALUE: 515 Ohm
MDC IDC MSMT LEADCHNL RA PACING THRESHOLD AMPLITUDE: 0.5 V
MDC IDC STAT BRADY AP VS PERCENT: 2 %

## 2013-11-08 ENCOUNTER — Telehealth: Payer: Self-pay | Admitting: Cardiology

## 2013-11-08 MED ORDER — DILTIAZEM HCL ER COATED BEADS 120 MG PO CP24: 240.0000 mg | ORAL_CAPSULE | Freq: Every day | ORAL | Status: DC

## 2013-11-08 NOTE — Telephone Encounter (Signed)
Advised V-rate remains elevated. Increase Diltiazem to 240mg  once daily. Patiejt spouse verbalized understanding.

## 2013-11-28 ENCOUNTER — Ambulatory Visit: Payer: Medicare Other | Admitting: Cardiology

## 2013-12-25 ENCOUNTER — Ambulatory Visit (INDEPENDENT_AMBULATORY_CARE_PROVIDER_SITE_OTHER): Payer: Medicare Other | Admitting: Cardiology

## 2013-12-25 ENCOUNTER — Encounter: Payer: Self-pay | Admitting: Cardiology

## 2013-12-25 VITALS — BP 124/66 | HR 58 | Ht 70.0 in | Wt 209.0 lb

## 2013-12-25 DIAGNOSIS — I2510 Atherosclerotic heart disease of native coronary artery without angina pectoris: Secondary | ICD-10-CM | POA: Diagnosis not present

## 2013-12-25 DIAGNOSIS — E669 Obesity, unspecified: Secondary | ICD-10-CM | POA: Diagnosis not present

## 2013-12-25 DIAGNOSIS — I4891 Unspecified atrial fibrillation: Secondary | ICD-10-CM | POA: Diagnosis not present

## 2013-12-25 DIAGNOSIS — Z950 Presence of cardiac pacemaker: Secondary | ICD-10-CM | POA: Insufficient documentation

## 2013-12-25 DIAGNOSIS — I495 Sick sinus syndrome: Secondary | ICD-10-CM | POA: Diagnosis not present

## 2013-12-25 LAB — LIPID PANEL
Cholesterol: 137 mg/dL (ref 0–200)
HDL: 57.9 mg/dL (ref >39.00)
LDL CALC: 64 mg/dL (ref 0–99)
Total CHOL/HDL Ratio: 2
Triglycerides: 74 mg/dL (ref 0.0–149.0)
VLDL: 14.8 mg/dL (ref 0.0–40.0)

## 2013-12-25 LAB — BASIC METABOLIC PANEL
BUN: 15 mg/dL (ref 6–23)
CO2: 30 mEq/L (ref 19–32)
Calcium: 8.8 mg/dL (ref 8.4–10.5)
Chloride: 109 mEq/L (ref 96–112)
Creatinine, Ser: 1.4 mg/dL (ref 0.4–1.5)
GFR: 53.27 mL/min — ABNORMAL LOW (ref >60.00)
Glucose, Bld: 107 mg/dL — ABNORMAL HIGH (ref 70–99)
POTASSIUM: 3.5 meq/L (ref 3.5–5.1)
SODIUM: 144 meq/L (ref 135–145)

## 2013-12-25 LAB — CBC WITH DIFFERENTIAL/PLATELET
BASOS ABS: 0 10*3/uL (ref 0.0–0.1)
Basophils Relative: 0.3 % (ref 0.0–3.0)
Eosinophils Absolute: 0 10*3/uL (ref 0.0–0.7)
Eosinophils Relative: 0.5 % (ref 0.0–5.0)
HEMATOCRIT: 38.9 % — AB (ref 39.0–52.0)
HEMOGLOBIN: 12.4 g/dL — AB (ref 13.0–17.0)
LYMPHS ABS: 1 10*3/uL (ref 0.7–4.0)
Lymphocytes Relative: 13.1 % (ref 12.0–46.0)
MCHC: 31.8 g/dL (ref 30.0–36.0)
MCV: 88.5 fl (ref 78.0–100.0)
MONOS PCT: 8.4 % (ref 3.0–12.0)
Monocytes Absolute: 0.6 10*3/uL (ref 0.1–1.0)
Neutro Abs: 5.8 10*3/uL (ref 1.4–7.7)
Neutrophils Relative %: 77.7 % — ABNORMAL HIGH (ref 43.0–77.0)
PLATELETS: 255 10*3/uL (ref 150.0–400.0)
RBC: 4.39 Mil/uL (ref 4.22–5.81)
RDW: 16.1 % — ABNORMAL HIGH (ref 11.5–14.6)
WBC: 7.5 10*3/uL (ref 4.5–10.5)

## 2013-12-25 NOTE — Patient Instructions (Signed)
Your physician recommends that you continue on your current medications as directed. Please refer to the Current Medication list given to you today.  Your physician recommends that you have labs today: BMET. CBC, Lipid  Your physician wants you to follow-up in: 6 months with Dr. Marlou Porch. You will receive a reminder letter in the mail two months in advance. If you don't receive a letter, please call our office to schedule the follow-up appointment.

## 2013-12-25 NOTE — Progress Notes (Signed)
[facility]. [address] [place], [place]  [address] Phone: [phone] Fax:  [phone]  Date:  12/25/2013   ID:  Zachary Warren, DOB Dec 14, 1932, MRN [id number]  PCP:  Wenda Low, MD   History of Present Illness: Zachary Warren is a 80 y.o. male with a hx of coronary artery disease status post bypass in 2003,and Atrial fibrillation cardioversion, x 3, failed amiodarone secondary to return of atrial fibrillation despite maintenance therapy, discontinuation of amiodarone on 11/14 with no change in symptoms here for followup. He's currently on anticoagulation with Pradaxa. He had pacemaker placed by Dr. Rayann Heman 6/12.   His pacemaker is functioning well. He had it checked and he is being paced 96% of the time.  He was in the hospital for fever of unknown origin, as high as 104? he is doing better. He has been complaining of some fatigue. Blood pressure has been running slightly low.  On 10/16/13 he maintained his atrial fibrillation but his heart rate is now 101 beats per minute. I decided to start him on diltiazem CD 120 mg. He reports no syncope, no fevers, no chills, no significant shortness of breath. He has been battling a cough with mucous production.  12/25/13-diltiazem increased previously 240 mg once a day. Better overall rate control. He did have a brief 1 minute episode of left-sided chest discomfort fleeting at rest. No further occurrence. Non-exertional. Continuing to monitor. He has done an excellent job with weight loss.    Wt Readings from Last 3 Encounters:  12/25/13 209 lb (94.802 kg)  10/16/13 212 lb (96.163 kg)  09/15/13 214 lb 12.8 oz (97.433 kg)     Past Medical History  Diagnosis Date  . CVA (cerebral infarction) 1999  . DDD (degenerative disc disease), cervical   . Memory impairment   . Persistent atrial fibrillation     on pradaxa  . Coronary artery disease     s/p 5v CABG 2002  . Depression   . GERD (gastroesophageal reflux disease)   .  Hyperlipidemia   . Hypertension   . PVD (peripheral vascular disease)   . 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 OSA (obstructive sleep apnea)     Recently diagnosed though pt. has not had CPAP trial  . Seizures   . Pacemaker   . Prostate cancer 2006    s/p XRT    Past Surgical History  Procedure Laterality Date  . Coronary artery bypass graft  2002  . Cea  1999  . Pacemaker insertion      implanted by JA 6/12  . Insert / replace / remove pacemaker    . Cardioversion N/A 08/21/2013    Procedure: CARDIOVERSION;  Surgeon: Candee Furbish, MD;  Location: [facility] ENDOSCOPY;  Service: Cardiovascular;  Laterality: N/A;    Current Outpatient Prescriptions  Medication Sig Dispense Refill  . atorvastatin (LIPITOR) 40 MG tablet Take 40 mg by mouth daily.      . dabigatran (PRADAXA) 150 MG CAPS capsule Take 1 capsule (150 mg total) by mouth every 12 (twelve) hours.  60 capsule  6  . diltiazem (CARDIZEM CD) 120 MG 24 hr capsule Take 2 capsules (240 mg total) by mouth daily.  60 capsule  3  . FLUoxetine (PROZAC) 40 MG capsule Take 40 mg by mouth daily.        . furosemide (LASIX) 40 MG tablet Take 40 mg by mouth daily.        Marland Kitchen  Lutein 20 MG CAPS Take 1 capsule by mouth daily.       . Multiple Vitamins-Minerals (MULTIVITAMIN WITH MINERALS) tablet Take 1 tablet by mouth daily. Chewable flinstones      . omeprazole (PRILOSEC OTC) 20 MG tablet Take 20 mg by mouth 2 (two) times daily.        . potassium chloride SA (K-DUR,KLOR-CON) 20 MEQ tablet Take 20 mEq by mouth 2 (two) times daily.        Marland Kitchen topiramate (TOPAMAX) 50 MG tablet TAKE 1 TABLET BY MOUTH TWICE DAILY  60 tablet  11   No current facility-administered medications for this visit.    Allergies:    Allergies  Allergen Reactions  . Ramipril Other (See Comments)     cough    Social History:  The patient  reports that he quit smoking about 15 years ago. His smoking use included Cigarettes. He has a 96 pack-year smoking history. He  has never used smokeless tobacco. He reports that he does not drink alcohol or use illicit drugs.   ROS:  Please see the history of present illness.   Positive for atrial fibrillation, occasional fatigue. No change after cardioversion.  PHYSICAL EXAM: VS:  BP 124/66  Pulse 58  Ht 5\' 10"  (1.778 m)  Wt 209 lb (94.802 kg)  BMI 29.99 kg/m2 Well nourished, well developed, in no acute distress HEENT: normal Neck: no JVD Cardiac:  Irregularly irregular, no longer tachycardic; no murmur Lungs:  Upper airway congestion noted Abd: soft, nontender, no hepatomegalyOverweight, improved Ext: no edema Skin: warm and dry Neuro: no focal abnormalities noted  EKG:  10/16/13-heart rate 103, atrial fibrillation     ASSESSMENT AND PLAN:  1. Atrial fibrillation- He is in persistent atrial fibrillation. Rate control has been somewhat challenging off of amiodarone. He did not feel any different after cardioversion in 11/14. Took him off of antiarrhythmic amiodarone because of persistent A. fib. Hence, we stopped his amiodarone in December of 2015. On diltiazem CD 240 mg once a day for improved rate control. I have counseled him on potential side effects, edema. He has not been on diltiazem in the past. He has been on Toprol in the past and I do believe that we have stopped this because of potential fatigue. 2. Chronic anticoagulation- Pradaxa. We will check CBC, BMET today 3. CAD - stable, no angina 4. Hyperlipidemia - LDL goal 70. Checking lipids. 5. Tachycardia/bradycardia syndrome-pacemaker functioning well. 6. Obesity-Dr. Lysle Rubens encourage weight loss and he is doing quite well losing. BMI today <30! 7. Upper airway congestion/mucous production-encouraged Mucinex. Do not use decongestant for him. 8. We'll see him back in 6 months.  Signed,         Candee Furbish, MD [facility]  12/25/2013 9:31 AM

## 2013-12-29 DIAGNOSIS — J189 Pneumonia, unspecified organism: Secondary | ICD-10-CM | POA: Diagnosis not present

## 2014-01-05 ENCOUNTER — Telehealth: Payer: Self-pay | Admitting: Cardiology

## 2014-01-05 DIAGNOSIS — R059 Cough, unspecified: Secondary | ICD-10-CM | POA: Diagnosis not present

## 2014-01-05 DIAGNOSIS — R05 Cough: Secondary | ICD-10-CM | POA: Diagnosis not present

## 2014-01-05 NOTE — Telephone Encounter (Signed)
New message    Patient has appt with PCP today. Wife is asking for blood work 3/16  to be fax over .    Dr. Dominga Ferry @ [facility] Physician.

## 2014-01-09 ENCOUNTER — Telehealth: Payer: Self-pay | Admitting: Cardiology

## 2014-01-09 NOTE — Telephone Encounter (Signed)
Spouse said patient has has Edema/persistant. Dr. Sheryle Hail increased Lasix to 80 qd and K to 20 meq TID X5. Still swelling, Dr Marlou Porch increases Lasix to 80 BID for x3. Keep K at  57meq TID. Patient will weight daily and call with weight.

## 2014-01-11 ENCOUNTER — Other Ambulatory Visit: Payer: Self-pay | Admitting: *Deleted

## 2014-01-11 DIAGNOSIS — E782 Mixed hyperlipidemia: Secondary | ICD-10-CM

## 2014-01-12 NOTE — Telephone Encounter (Signed)
Lab results sent to Dr. Dominga Ferry

## 2014-01-15 ENCOUNTER — Other Ambulatory Visit: Payer: Medicare Other

## 2014-01-29 ENCOUNTER — Ambulatory Visit (INDEPENDENT_AMBULATORY_CARE_PROVIDER_SITE_OTHER): Payer: Medicare Other | Admitting: *Deleted

## 2014-01-29 DIAGNOSIS — I495 Sick sinus syndrome: Secondary | ICD-10-CM

## 2014-02-01 ENCOUNTER — Encounter: Payer: Self-pay | Admitting: Internal Medicine

## 2014-02-01 LAB — MDC_IDC_ENUM_SESS_TYPE_REMOTE
Battery Remaining Longevity: 121 mo
Battery Voltage: 2.79 V
Brady Statistic AP VS Percent: 1 %
Brady Statistic AS VP Percent: 0.1 %
Lead Channel Impedance Value: 546 Ohm
Lead Channel Pacing Threshold Amplitude: 1 V
Lead Channel Sensing Intrinsic Amplitude: 2.8 mV
Lead Channel Setting Pacing Amplitude: 2 V
Lead Channel Setting Pacing Amplitude: 2.5 V
Lead Channel Setting Pacing Pulse Width: 0.4 ms
Lead Channel Setting Sensing Sensitivity: 4 mV
MDC IDC MSMT LEADCHNL RA PACING THRESHOLD AMPLITUDE: 0.5 V
MDC IDC MSMT LEADCHNL RA PACING THRESHOLD PULSEWIDTH: 0.4 ms
MDC IDC MSMT LEADCHNL RV IMPEDANCE VALUE: 425 Ohm
MDC IDC MSMT LEADCHNL RV PACING THRESHOLD PULSEWIDTH: 0.4 ms
MDC IDC MSMT LEADCHNL RV SENSING INTR AMPL: 8 mV
MDC IDC STAT BRADY AP VP PERCENT: 0.2 %
MDC IDC STAT BRADY AS VS PERCENT: 98.6 %

## 2014-02-05 DIAGNOSIS — E782 Mixed hyperlipidemia: Secondary | ICD-10-CM | POA: Diagnosis not present

## 2014-02-05 DIAGNOSIS — K219 Gastro-esophageal reflux disease without esophagitis: Secondary | ICD-10-CM | POA: Diagnosis not present

## 2014-02-05 DIAGNOSIS — I10 Essential (primary) hypertension: Secondary | ICD-10-CM | POA: Diagnosis not present

## 2014-02-05 DIAGNOSIS — Z1331 Encounter for screening for depression: Secondary | ICD-10-CM | POA: Diagnosis not present

## 2014-02-05 DIAGNOSIS — Z0000 Encounter for general adult medical examination without abnormal findings: Secondary | ICD-10-CM | POA: Diagnosis not present

## 2014-02-05 DIAGNOSIS — I69991 Dysphagia following unspecified cerebrovascular disease: Secondary | ICD-10-CM | POA: Diagnosis not present

## 2014-02-05 DIAGNOSIS — N183 Chronic kidney disease, stage 3 unspecified: Secondary | ICD-10-CM | POA: Diagnosis not present

## 2014-02-05 DIAGNOSIS — Z8546 Personal history of malignant neoplasm of prostate: Secondary | ICD-10-CM | POA: Diagnosis not present

## 2014-02-05 DIAGNOSIS — I4891 Unspecified atrial fibrillation: Secondary | ICD-10-CM | POA: Diagnosis not present

## 2014-02-05 DIAGNOSIS — R7309 Other abnormal glucose: Secondary | ICD-10-CM | POA: Diagnosis not present

## 2014-02-06 ENCOUNTER — Encounter: Payer: Self-pay | Admitting: *Deleted

## 2014-02-10 ENCOUNTER — Other Ambulatory Visit: Payer: Self-pay | Admitting: Cardiology

## 2014-02-12 NOTE — Progress Notes (Signed)
PPM remote 

## 2014-03-06 ENCOUNTER — Other Ambulatory Visit: Payer: Self-pay | Admitting: Cardiology

## 2014-03-13 ENCOUNTER — Other Ambulatory Visit: Payer: Self-pay | Admitting: Cardiology

## 2014-03-13 ENCOUNTER — Other Ambulatory Visit: Payer: Self-pay | Admitting: Dermatology

## 2014-03-13 DIAGNOSIS — C44111 Basal cell carcinoma of skin of unspecified eyelid, including canthus: Secondary | ICD-10-CM | POA: Diagnosis not present

## 2014-03-26 DIAGNOSIS — R454 Irritability and anger: Secondary | ICD-10-CM | POA: Diagnosis not present

## 2014-03-26 DIAGNOSIS — F05 Delirium due to known physiological condition: Secondary | ICD-10-CM | POA: Diagnosis not present

## 2014-03-26 DIAGNOSIS — R413 Other amnesia: Secondary | ICD-10-CM | POA: Diagnosis not present

## 2014-03-27 DIAGNOSIS — F05 Delirium due to known physiological condition: Secondary | ICD-10-CM | POA: Diagnosis not present

## 2014-04-19 DIAGNOSIS — C44111 Basal cell carcinoma of skin of unspecified eyelid, including canthus: Secondary | ICD-10-CM | POA: Diagnosis not present

## 2014-04-25 DIAGNOSIS — F329 Major depressive disorder, single episode, unspecified: Secondary | ICD-10-CM | POA: Diagnosis not present

## 2014-04-25 DIAGNOSIS — R062 Wheezing: Secondary | ICD-10-CM | POA: Diagnosis not present

## 2014-04-25 DIAGNOSIS — R413 Other amnesia: Secondary | ICD-10-CM | POA: Diagnosis not present

## 2014-05-02 ENCOUNTER — Ambulatory Visit (INDEPENDENT_AMBULATORY_CARE_PROVIDER_SITE_OTHER): Payer: Medicare Other | Admitting: *Deleted

## 2014-05-02 ENCOUNTER — Telehealth: Payer: Self-pay | Admitting: Cardiology

## 2014-05-02 ENCOUNTER — Encounter: Payer: Self-pay | Admitting: Internal Medicine

## 2014-05-02 DIAGNOSIS — I495 Sick sinus syndrome: Secondary | ICD-10-CM | POA: Diagnosis not present

## 2014-05-02 LAB — MDC_IDC_ENUM_SESS_TYPE_REMOTE
Battery Impedance: 182 Ohm
Battery Remaining Longevity: 126 mo
Brady Statistic AP VP Percent: 0 %
Lead Channel Impedance Value: 537 Ohm
Lead Channel Sensing Intrinsic Amplitude: 2.8 mV
Lead Channel Setting Pacing Amplitude: 2.5 V
Lead Channel Setting Pacing Pulse Width: 0.4 ms
Lead Channel Setting Sensing Sensitivity: 4 mV
MDC IDC MSMT BATTERY VOLTAGE: 2.79 V
MDC IDC MSMT LEADCHNL RV IMPEDANCE VALUE: 415 Ohm
MDC IDC MSMT LEADCHNL RV PACING THRESHOLD AMPLITUDE: 0.875 V
MDC IDC MSMT LEADCHNL RV PACING THRESHOLD PULSEWIDTH: 0.4 ms
MDC IDC MSMT LEADCHNL RV SENSING INTR AMPL: 22.4 mV
MDC IDC SESS DTM: 20150722185306
MDC IDC SET LEADCHNL RA PACING AMPLITUDE: 2 V
MDC IDC STAT BRADY AP VS PERCENT: 1 %
MDC IDC STAT BRADY AS VP PERCENT: 0 %
MDC IDC STAT BRADY AS VS PERCENT: 99 %

## 2014-05-02 NOTE — Telephone Encounter (Signed)
LMOVM reminding pt to send remote transmission.   

## 2014-05-03 ENCOUNTER — Encounter: Payer: Self-pay | Admitting: Cardiology

## 2014-05-03 NOTE — Progress Notes (Signed)
Remote pacemaker transmission.   

## 2014-05-24 ENCOUNTER — Encounter: Payer: Self-pay | Admitting: Cardiology

## 2014-05-29 ENCOUNTER — Telehealth: Payer: Self-pay

## 2014-05-29 NOTE — Telephone Encounter (Signed)
Patient call for samples of pradaxa 150 placed samples up front

## 2014-05-30 DIAGNOSIS — F0390 Unspecified dementia without behavioral disturbance: Secondary | ICD-10-CM | POA: Diagnosis not present

## 2014-05-30 DIAGNOSIS — F329 Major depressive disorder, single episode, unspecified: Secondary | ICD-10-CM | POA: Diagnosis not present

## 2014-05-30 DIAGNOSIS — I10 Essential (primary) hypertension: Secondary | ICD-10-CM | POA: Diagnosis not present

## 2014-05-30 DIAGNOSIS — N183 Chronic kidney disease, stage 3 unspecified: Secondary | ICD-10-CM | POA: Diagnosis not present

## 2014-05-30 DIAGNOSIS — R7309 Other abnormal glucose: Secondary | ICD-10-CM | POA: Diagnosis not present

## 2014-05-30 DIAGNOSIS — K219 Gastro-esophageal reflux disease without esophagitis: Secondary | ICD-10-CM | POA: Diagnosis not present

## 2014-05-30 DIAGNOSIS — E782 Mixed hyperlipidemia: Secondary | ICD-10-CM | POA: Diagnosis not present

## 2014-06-19 DIAGNOSIS — I10 Essential (primary) hypertension: Secondary | ICD-10-CM | POA: Diagnosis not present

## 2014-06-19 DIAGNOSIS — R413 Other amnesia: Secondary | ICD-10-CM | POA: Diagnosis not present

## 2014-06-19 DIAGNOSIS — R42 Dizziness and giddiness: Secondary | ICD-10-CM | POA: Diagnosis not present

## 2014-06-20 ENCOUNTER — Other Ambulatory Visit: Payer: Self-pay | Admitting: Nurse Practitioner

## 2014-06-28 DIAGNOSIS — J189 Pneumonia, unspecified organism: Secondary | ICD-10-CM | POA: Diagnosis not present

## 2014-06-28 DIAGNOSIS — R5381 Other malaise: Secondary | ICD-10-CM | POA: Diagnosis not present

## 2014-07-06 ENCOUNTER — Telehealth: Payer: Self-pay

## 2014-07-06 NOTE — Telephone Encounter (Signed)
Patient 's wife called for samples of pradaxa placed sample up front . A month supply

## 2014-07-08 ENCOUNTER — Other Ambulatory Visit: Payer: Self-pay | Admitting: Cardiology

## 2014-07-10 DIAGNOSIS — R1310 Dysphagia, unspecified: Secondary | ICD-10-CM | POA: Diagnosis not present

## 2014-07-10 DIAGNOSIS — J690 Pneumonitis due to inhalation of food and vomit: Secondary | ICD-10-CM | POA: Diagnosis not present

## 2014-07-10 DIAGNOSIS — Z23 Encounter for immunization: Secondary | ICD-10-CM | POA: Diagnosis not present

## 2014-07-10 DIAGNOSIS — J189 Pneumonia, unspecified organism: Secondary | ICD-10-CM | POA: Diagnosis not present

## 2014-07-10 DIAGNOSIS — Z1331 Encounter for screening for depression: Secondary | ICD-10-CM | POA: Diagnosis not present

## 2014-07-13 ENCOUNTER — Other Ambulatory Visit: Payer: Self-pay | Admitting: Internal Medicine

## 2014-07-13 DIAGNOSIS — R4702 Dysphasia: Secondary | ICD-10-CM

## 2014-07-17 ENCOUNTER — Other Ambulatory Visit: Payer: Medicare Other

## 2014-07-19 ENCOUNTER — Other Ambulatory Visit (HOSPITAL_COMMUNITY): Payer: Self-pay | Admitting: Internal Medicine

## 2014-07-19 DIAGNOSIS — R1310 Dysphagia, unspecified: Secondary | ICD-10-CM

## 2014-07-21 ENCOUNTER — Other Ambulatory Visit: Payer: Self-pay | Admitting: Nurse Practitioner

## 2014-07-21 ENCOUNTER — Other Ambulatory Visit: Payer: Self-pay | Admitting: Internal Medicine

## 2014-07-23 ENCOUNTER — Telehealth: Payer: Self-pay | Admitting: *Deleted

## 2014-07-23 NOTE — Telephone Encounter (Signed)
Patient calling to schedule appointment with NP LL for medication refill, patient was r/s for 07/24/14 at 1:30 pm with NP LL.

## 2014-07-24 ENCOUNTER — Ambulatory Visit (HOSPITAL_COMMUNITY)
Admission: RE | Admit: 2014-07-24 | Discharge: 2014-07-24 | Disposition: A | Discharge status: Home / Self Care | Payer: Medicare Other | Source: Ambulatory Visit | Attending: Internal Medicine | Admitting: Internal Medicine

## 2014-07-24 ENCOUNTER — Encounter: Payer: Self-pay | Admitting: Nurse Practitioner

## 2014-07-24 ENCOUNTER — Telehealth: Payer: Self-pay | Admitting: *Deleted

## 2014-07-24 ENCOUNTER — Ambulatory Visit (INDEPENDENT_AMBULATORY_CARE_PROVIDER_SITE_OTHER): Payer: Medicare Other | Admitting: Nurse Practitioner

## 2014-07-24 VITALS — BP 139/68 | HR 83 | Temp 97.1°F | Ht 70.0 in | Wt 203.6 lb

## 2014-07-24 DIAGNOSIS — Z951 Presence of aortocoronary bypass graft: Secondary | ICD-10-CM | POA: Diagnosis not present

## 2014-07-24 DIAGNOSIS — I10 Essential (primary) hypertension: Secondary | ICD-10-CM | POA: Insufficient documentation

## 2014-07-24 DIAGNOSIS — I69320 Aphasia following cerebral infarction: Secondary | ICD-10-CM | POA: Diagnosis not present

## 2014-07-24 DIAGNOSIS — G250 Essential tremor: Secondary | ICD-10-CM

## 2014-07-24 DIAGNOSIS — F0390 Unspecified dementia without behavioral disturbance: Secondary | ICD-10-CM | POA: Diagnosis not present

## 2014-07-24 DIAGNOSIS — R1310 Dysphagia, unspecified: Secondary | ICD-10-CM | POA: Insufficient documentation

## 2014-07-24 DIAGNOSIS — I2510 Atherosclerotic heart disease of native coronary artery without angina pectoris: Secondary | ICD-10-CM

## 2014-07-24 DIAGNOSIS — G40209 Localization-related (focal) (partial) symptomatic epilepsy and epileptic syndromes with complex partial seizures, not intractable, without status epilepticus: Secondary | ICD-10-CM

## 2014-07-24 DIAGNOSIS — R251 Tremor, unspecified: Secondary | ICD-10-CM

## 2014-07-24 DIAGNOSIS — E785 Hyperlipidemia, unspecified: Secondary | ICD-10-CM | POA: Insufficient documentation

## 2014-07-24 DIAGNOSIS — G3184 Mild cognitive impairment, so stated: Secondary | ICD-10-CM | POA: Diagnosis not present

## 2014-07-24 DIAGNOSIS — I6931 Cognitive deficits following cerebral infarction: Secondary | ICD-10-CM | POA: Diagnosis not present

## 2014-07-24 DIAGNOSIS — R1312 Dysphagia, oropharyngeal phase: Secondary | ICD-10-CM | POA: Diagnosis not present

## 2014-07-24 DIAGNOSIS — G252 Other specified forms of tremor: Secondary | ICD-10-CM

## 2014-07-24 MED ORDER — TOPIRAMATE 50 MG PO TABS: 50.0000 mg | ORAL_TABLET | Freq: Two times a day (BID) | ORAL | Status: DC

## 2014-07-24 NOTE — Progress Notes (Signed)
PATIENT: Zachary Warren DOB: 18-Oct-1932  REASON FOR VISIT: routine follow up for tremor, [facility] HISTORY FROM: wife and patient  HISTORY OF PRESENT ILLNESS: UPDATE 07/24/14 (LL): Zachary Warren returns to the office for yearly followup. His wife reports no seizures in the last year. He states that his tremor is stable and does not think it is any worse. He recently was treated for pneumonia and wife wondered if it was aspiration pneumonia, asked for swallowing study which they had completed today prior to coming to the office. He reportedly had some difficulty with thin liquids and was recommended to use a thickener to create nectar-thick liquids and avoid straws. He was recommended for speech therapy which he will be attending and neuro rehabilitation. Wife states that she sees a slow progressive decline in his memory and functioning. She is recently joined an Alzheimer support group to that meets at [facility]. After last visit he tried donepezil for 3 days and had severe diarrhea and nausea so they stopped it.Dr Deforest Hoyles had trialed him on Namenda as well, he finished the titration pack in one month of the 20 mg XR dose. His wife thought he was more dizzy and confused while taking it, having to hold onto furniture to walk, so they discontinued it.   UPDATE 05/25/13 (LL): Zachary Warren comes in with his wife for yearly follow up. No falls at home in last year and no hospitalizations. Tremor well-controlled but has resting tremor in left hand now. Sleeps most of the day in recliner but states he can't sleep well at night. States he has to urinate average 6 times per night; uses urinal. Wife finds him more confused in the last 6 months. For example, asks for objects by the wrong name, put sour cream on his cantaloupe instead of the potato. She says he often acts like he is looking for something but when questioned, can't tell you what it is. Wants baseline memory testing.  UPDATE 04/19/12 ([facility]): No new  seizures. Had fever 104 and admitted to [facility] in June 2013. Also, fell out of bed on 04/16/12 with left supraorbital laceration. No LOC. Tremor well controlled.  UPDATE 08/11/11: Pacemaker placed in June. On Pradaxa and Metoprolol. Pt has not had any seizure activity and tremor is intermittent. Denies side effects to Topamax. No new neurologic complaints. See ROS  PRIOR HPI: 80 year old white male returns today for followup. He was last seen 12/12/10. He has a history of benign essential tremor, history of a CVA in May of 1999, associated with mixed aphagia and right hemiparesis. His tremor developed in 2004. He developed seizure events in August of 2009. EEG was normal in the awake state. MRI showed nothing acute. He is currently on Topamax for both his tremor and his seizure activity tolerating without side effects and no further seizure activity. He gets no regular exercise, weight has remained stable. No falls, no assistive device, continues to complain with extreme fatigue. Wife says he can sleep in the chair during the day and then sleep 6 to 8 hrs at night. Denies early morning headache. He does not feel refreshed in the morning. She does not know if he snores or has periods of apnea as they sleep in separate rooms. He also has a history of depression and is on meds. Recent swallowing study by PCP with out abnormalities. New dx of GERD and hiatal hernia.   REVIEW OF SYSTEMS: Full 14 system review of systems performed and notable only for:  Appetite change, choking, leg swelling, daytime sleepiness, frequency of urination, memory loss, dizziness, speech difficulty   ALLERGIES: Allergies  Allergen Reactions  . Ramipril Other (See Comments)     cough    HOME MEDICATIONS: Outpatient Prescriptions Prior to Visit  Medication Sig Dispense Refill  . atorvastatin (LIPITOR) 40 MG tablet TAKE 1 TABLET BY MOUTH ONCE A DAY  30 tablet  5  . diltiazem (CARDIZEM CD) 120 MG 24 hr capsule TAKE 2 CAPSULES BY MOUTH  EVERY DAY  60 capsule  0  . FLUoxetine (PROZAC) 40 MG capsule Take 40 mg by mouth daily.        . furosemide (LASIX) 40 MG tablet Take 40 mg by mouth daily.        . Lutein 20 MG CAPS Take 1 capsule by mouth daily.       . Multiple Vitamins-Minerals (MULTIVITAMIN WITH MINERALS) tablet Take 1 tablet by mouth daily. Chewable flinstones      . omeprazole (PRILOSEC OTC) 20 MG tablet Take 20 mg by mouth 2 (two) times daily.        . potassium chloride SA (K-DUR,KLOR-CON) 20 MEQ tablet TAKE 1 TABLET BY MOUTH TWICE DAILY  60 tablet  6  . PRADAXA 150 MG CAPS capsule TAKE 1 CAPSULE BY MOUTH TWICE DAILY  60 capsule  11  . topiramate (TOPAMAX) 50 MG tablet TAKE 1 TABLET BY MOUTH TWICE DAILY  60 tablet  0   No facility-administered medications prior to visit.    PHYSICAL EXAM Filed Vitals:   07/24/14 1334  BP: 139/68  Pulse: 83  Temp: 97.1 F (36.2 C)  TempSrc: Oral  Height: 5\' 10"  (1.778 m)  Weight: 203 lb 9.6 oz (92.352 kg)   Body mass index is 29.21 kg/(m^2). No exam data present No flowsheet data found.  MMSE - Mini Mental State Exam 07/24/2014  Orientation to time 5  Orientation to Place 3  Registration 3  Attention/ Calculation 5  Recall 0  Language- name 2 objects 2  Language- repeat 1  Language- follow 3 step command 3  Language- read & follow direction 1  Write a sentence 1  Copy design 1  Total score 25    Generalized: In no acute distress, pleasant elderly Caucasian male.  Neck: Supple, no carotid bruits  Cardiac: Regular rate rhythm, no murmur  Pulmonary: Clear to auscultation bilaterally  Musculoskeletal: No deformity   Neurologic Exam  Mental Status: Awake, alert. MILD EXP APHASIA. DECR FLUENCY. CLOCK DRAWING 4/4, GDS 9. (Previous visit, 05/2013: MMSE 19/30 WITH DEFICITS IN TIME (MONTH, DATE); ATTENTION AND CALCULATION; RECALL 0/3; REPETITION; COMPREHENSION. AFT 2 (NORMAL 12+) CLOCK DRAWING 4/4.) Cranial Nerves: Pupils are equal and reactive to light. Visual  fields are full to confrontation. Conjugate eye movements are full and symmetric. Facial sensation and strength are symmetric. Hearing is intact. Palate elevated symmetrically and uvula is midline. Shoulder shrug is symmetric. Tongue is midline.  Motor: POSTURAL AND ACTION TREMOR OF BUE. RESTING TREMOR OF LEFT HAND WITH PILL-ROLLING QUALITY. MILD RIGHT HEMIPARESIS (ARM). SLOW FINGER TAPS. SLOW TOE TAPS.  Sensory: Intact and symmetric to light touch.  Coordination: No ataxia or dysmetria on finger-nose or rapid alternating movement testing.  Gait and Station: SLOW WIDE-based gait. TURNS WITH SMALL STEPS.  Reflexes: Deep tendon reflexes in the upper and lower extremity are present and symmetric.   ASSESSMENT: 80 year old male with history of left brain stroke, right hemiparesis, partial and generalized seizures, and essential tremor. No seizure since  2009. Increased confusion and short-term memory loss in the last 2 years, likely Alzheimer's or mixed Vascular Dementia. He has not tolerated Aricept or Namenda.  I discussed with them Cerefolin NAC, but they declined at this time.  PLAN:  1. continue TPX 50mg  BID. Refilled Rx.  2. continue stroke prevention (pradaxa, BP control, statin).  3. Follow up in 1 year with Dr. Leta Baptist, sooner as needed.   Meds ordered this encounter  Medications  . topiramate (TOPAMAX) 50 MG tablet    Sig: Take 1 tablet (50 mg total) by mouth 2 (two) times daily.    Dispense:  180 tablet    Refill:  4    Order Specific Question:  Supervising Provider    Answer:  Penni Bombard [[id number]]   Return in about 1 year (around 07/25/2015) for seizures and tremor.  Rudi Rummage Naftali Carchi, MSN, FNP-BC, A/GNP-C 07/24/2014, 5:02 PM [facility] [address], [place], [place] [address] [phone]  Note: This document was prepared with digital dictation and possible smart phrase technology. Any transcriptional errors that result from this process are  unintentional.

## 2014-07-24 NOTE — Telephone Encounter (Signed)
Pradaxa samples placed at the front desk for patient. 

## 2014-07-24 NOTE — Patient Instructions (Signed)
PLAN:  1. continue TPX 50mg  BID. Refilled Rx.  2. continue stroke prevention (pradaxa, BP control, statin).  3. Follow up in 1 year with Dr. Leta Baptist, sooner as needed.

## 2014-07-24 NOTE — Procedures (Signed)
Objective Swallowing Evaluation: Modified Barium Swallowing Study  Patient Details  Name: Zachary Warren MRN: [id number] Date of Birth: 20-Dec-1932  Today's Date: 07/24/2014 Time: 1130-1200 SLP Time Calculation (min): 30 min  Past Medical History:  Past Medical History  Diagnosis Date  . CVA (cerebral infarction) 1999  . DDD (degenerative disc disease), cervical   . Memory impairment   . Persistent atrial fibrillation     on pradaxa  . Coronary artery disease     s/p 5v CABG 2002  . Depression   . GERD (gastroesophageal reflux disease)   . Hyperlipidemia   . Hypertension   . PVD (peripheral vascular disease)   . 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 OSA (obstructive sleep apnea)     Recently diagnosed though pt. has not had CPAP trial  . Seizures   . Pacemaker   . Prostate cancer 2006    s/p XRT   Past Surgical History:  Past Surgical History  Procedure Laterality Date  . Coronary artery bypass graft  2002  . Cea  1999  . Pacemaker insertion      implanted by JA 6/12  . Insert / replace / remove pacemaker    . Cardioversion N/A 08/21/2013    Procedure: CARDIOVERSION;  Surgeon: Candee Furbish, MD;  Location: [facility] ENDOSCOPY;  Service: Cardiovascular;  Laterality: N/A;   HPI:  Zachary Warren is a 80 y.o. male with a hx of coronary artery disease status post bypass in 2003,and Atrial fibrillation cardioversion, x 3. He has a remote history of CVA in 1999 with residual cognitive lingusitic deficits (wife reports "mental problems" but pt appears to have aphasia). He arrives for an outpatient MBS due to a three month decline in cognitive funciton, 30lb weight loss and frequent coughing with meals. Pt had left lobe pna in September and wife suspected aspiration pna. She also reports pt is being treated for dementia.      Assessment / Plan / Recommendation Clinical Impression  Dysphagia Diagnosis: Moderate pharyngeal phase dysphagia Clinical impression: Pt presents  with a moderate sensory based oropharyngeal dysphagia with delayed swallow initiation across consistencies. Silent aspiration occurred before the swallow with cup sips of thin. A chin tuck mitigated aspiration though penetration to the cords was persistent, but expelled with a cued, immediate throat clear. In the exam the pt was able to complete the instruction to tuck chin, clear throat and swallow, though suspect this will be difficult to consistently achieve at home. Nectar thick liquids were tolerated without penetration or aspiration and thus pt and wife were given options and suggested to consume safer, nectar thick texture with f/u from outpatient SLP for training with strategies under supervision. Pt could also benefit from cognitive linguistic therapy given baseline aphasia with onset of dementia.     Treatment Recommendation  Defer treatment plan to SLP at (Comment) (outpatient)    Diet Recommendation Regular;Nectar-thick liquid   Liquid Administration via: Cup;No straw Medication Administration: Whole meds with puree (or nectar) Supervision: Patient able to self feed;Full supervision/cueing for compensatory strategies Compensations: Slow rate;Small sips/bites;Clear throat after each swallow (with thin liquids) Postural Changes and/or Swallow Maneuvers: Seated upright 90 degrees;Chin tuck (with thin liquids)    Other  Recommendations Oral Care Recommendations: Oral care BID   Follow Up Recommendations  Outpatient SLP    Frequency and Duration        Pertinent Vitals/Pain NA    SLP Swallow Goals     General HPI:  Zachary Warren is a 80 y.o. male with a hx of coronary artery disease status post bypass in 2003,and Atrial fibrillation cardioversion, x 3. He has a remote history of CVA in 1999 with residual cognitive lingusitic deficits (wife reports "mental problems" but pt appears to have aphasia). He arrives for an outpatient MBS due to a three month decline in cognitive funciton,  30lb weight loss and frequent coughing with meals. Pt had left lobe pna in September and wife suspected aspiration pna. She also reports pt is being treated for dementia.  Type of Study: Modified Barium Swallowing Study Reason for Referral: Objectively evaluate swallowing function Diet Prior to this Study: Regular;Thin liquids Temperature Spikes Noted: N/A Respiratory Status: Room air History of Recent Intubation: No Behavior/Cognition: Alert;Cooperative;Pleasant mood;Requires cueing Oral Cavity - Dentition: Adequate natural dentition Oral Motor / Sensory Function: Within functional limits Self-Feeding Abilities: Able to feed self Patient Positioning: Upright in chair Baseline Vocal Quality: Clear Volitional Cough: Strong Volitional Swallow: Able to elicit Anatomy: Within functional limits Pharyngeal Secretions: Not observed secondary MBS    Reason for Referral Objectively evaluate swallowing function   Oral Phase Oral Preparation/Oral Phase Oral Phase: WFL   Pharyngeal Phase Pharyngeal Phase Pharyngeal Phase: Impaired Pharyngeal - Nectar Pharyngeal - Nectar Cup: Delayed swallow initiation Pharyngeal - Thin Pharyngeal - Thin Cup: Delayed swallow initiation;Penetration/Aspiration before swallow;Penetration/Aspiration during swallow;Moderate aspiration;Reduced airway/laryngeal closure (chin tuck decreased aspiration, penetration persists) Penetration/Aspiration details (thin cup): Material enters airway, passes BELOW cords without attempt by patient to eject out (silent aspiration);Material enters airway, CONTACTS cords and not ejected out;Material enters airway, remains ABOVE vocal cords and not ejected out;Material does not enter airway Pharyngeal - Solids Pharyngeal - Puree: Delayed swallow initiation Pharyngeal - Regular: Delayed swallow initiation  Cervical Esophageal Phase    GO    Cervical Esophageal Phase Cervical Esophageal Phase: [facility]    Functional Assessment Tool  Used: clinical judgement Functional Limitations: Swallowing Swallow Current Status (U2353): At least 40 percent but less than 60 percent impaired, limited or restricted Swallow Goal Status [id number]): At least 20 percent but less than 40 percent impaired, limited or restricted Swallow Discharge Status [phone]): At least 20 percent but less than 40 percent impaired, limited or restricted   [facility], [place] CCC-SLP [phone]  Lynann Beaver 07/24/2014, 1:51 PM

## 2014-07-25 NOTE — Progress Notes (Signed)
I reviewed note and agree with plan.   Penni Bombard, MD 57/50/5183, 3:58 AM Certified in Neurology, Neurophysiology and Neuroimaging  [facility] [address] [place], [place] [address] [phone]

## 2014-07-30 ENCOUNTER — Ambulatory Visit (INDEPENDENT_AMBULATORY_CARE_PROVIDER_SITE_OTHER): Payer: Medicare Other | Admitting: Internal Medicine

## 2014-07-30 ENCOUNTER — Encounter: Payer: Self-pay | Admitting: Internal Medicine

## 2014-07-30 VITALS — BP 100/60 | HR 91 | Ht 69.0 in | Wt 200.8 lb

## 2014-07-30 DIAGNOSIS — I10 Essential (primary) hypertension: Secondary | ICD-10-CM | POA: Diagnosis not present

## 2014-07-30 DIAGNOSIS — I495 Sick sinus syndrome: Secondary | ICD-10-CM

## 2014-07-30 DIAGNOSIS — Z950 Presence of cardiac pacemaker: Secondary | ICD-10-CM | POA: Diagnosis not present

## 2014-07-30 DIAGNOSIS — I4819 Other persistent atrial fibrillation: Secondary | ICD-10-CM

## 2014-07-30 DIAGNOSIS — I481 Persistent atrial fibrillation: Secondary | ICD-10-CM | POA: Diagnosis not present

## 2014-07-30 DIAGNOSIS — I2510 Atherosclerotic heart disease of native coronary artery without angina pectoris: Secondary | ICD-10-CM

## 2014-07-30 LAB — MDC_IDC_ENUM_SESS_TYPE_INCLINIC
Battery Impedance: 182 Ohm
Battery Remaining Longevity: 126 mo
Battery Voltage: 2.79 V
Brady Statistic RV Percent Paced: 10 %
Date Time Interrogation Session: 20151019164217
Lead Channel Impedance Value: 547 Ohm
Lead Channel Pacing Threshold Amplitude: 0.75 V
Lead Channel Sensing Intrinsic Amplitude: 11.2 mV
Lead Channel Sensing Intrinsic Amplitude: 2.8 mV
Lead Channel Setting Pacing Amplitude: 2.5 V
Lead Channel Setting Sensing Sensitivity: 4 mV
MDC IDC MSMT LEADCHNL RV IMPEDANCE VALUE: 420 Ohm
MDC IDC MSMT LEADCHNL RV PACING THRESHOLD PULSEWIDTH: 0.4 ms
MDC IDC SET LEADCHNL RV PACING PULSEWIDTH: 0.4 ms

## 2014-07-30 MED ORDER — FUROSEMIDE 40 MG PO TABS: 20.0000 mg | ORAL_TABLET | Freq: Every day | ORAL | Status: DC

## 2014-07-30 MED ORDER — DILTIAZEM HCL ER COATED BEADS 240 MG PO CP24: 240.0000 mg | ORAL_CAPSULE | Freq: Every day | ORAL | Status: DC

## 2014-07-30 MED ORDER — POTASSIUM CHLORIDE CRYS ER 20 MEQ PO TBCR: 20.0000 meq | EXTENDED_RELEASE_TABLET | Freq: Every day | ORAL | Status: DC

## 2014-07-30 NOTE — Progress Notes (Signed)
PCP: Wenda Low, MD Primary Cardiologist:  Dr Marlou Porch  The patient presents today for routine electrophysiology followup.  He is doing well at this time.  He has permanent afib which he is tolerating.  Today, he denies symptoms of palpitations, chest pain, shortness of breath, orthopnea, PND,   presyncope, syncope, or neurologic sequela. He has mild edema. The patient feels that he is tolerating medications without difficulties and is otherwise without complaint today.   Past Medical History  Diagnosis Date  . CVA (cerebral infarction) 1999  . DDD (degenerative disc disease), cervical   . Memory impairment   . Persistent atrial fibrillation     on pradaxa  . Coronary artery disease     s/p 5v CABG 2002  . Depression   . GERD (gastroesophageal reflux disease)   . Hyperlipidemia   . Hypertension   . PVD (peripheral vascular disease)   . 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 OSA (obstructive sleep apnea)     Recently diagnosed though pt. has not had CPAP trial  . Seizures   . Pacemaker   . Prostate cancer 2006    s/p XRT   Past Surgical History  Procedure Laterality Date  . Coronary artery bypass graft  2002  . Cea  1999  . Pacemaker insertion      implanted by JA 6/12  . Insert / replace / remove pacemaker    . Cardioversion N/A 08/21/2013    Procedure: CARDIOVERSION;  Surgeon: Candee Furbish, MD;  Location: [facility] ENDOSCOPY;  Service: Cardiovascular;  Laterality: N/A;    Current Outpatient Prescriptions  Medication Sig Dispense Refill  . atorvastatin (LIPITOR) 40 MG tablet TAKE 1 TABLET BY MOUTH ONCE A DAY  30 tablet  5  . diltiazem (CARDIZEM CD) 240 MG 24 hr capsule Take 1 capsule (240 mg total) by mouth daily.  90 capsule  3  . FLUoxetine (PROZAC) 40 MG capsule Take 40 mg by mouth daily.        . furosemide (LASIX) 40 MG tablet Take 0.5 tablets (20 mg total) by mouth daily.  30 tablet    . Lutein 20 MG CAPS Take 1 capsule by mouth daily.       . Multiple  Vitamins-Minerals (MULTIVITAMIN WITH MINERALS) tablet Take 1 tablet by mouth daily. Chewable flinstones      . omeprazole (PRILOSEC OTC) 20 MG tablet Take 20 mg by mouth 2 (two) times daily.        . potassium chloride SA (K-DUR,KLOR-CON) 20 MEQ tablet Take 1 tablet (20 mEq total) by mouth daily.  60 tablet  6  . PRADAXA 150 MG CAPS capsule TAKE 1 CAPSULE BY MOUTH TWICE DAILY  60 capsule  11  . topiramate (TOPAMAX) 50 MG tablet Take 1 tablet (50 mg total) by mouth 2 (two) times daily.  180 tablet  4   No current facility-administered medications for this visit.    Allergies  Allergen Reactions  . Ramipril Other (See Comments)     cough    History   Social History  . Marital Status: Married    Spouse Name: [name]    Number of Children: 3  . Years of Education: N/A   Occupational History  . [employer]   Social History Main Topics  . Smoking status: Former Smoker -- 2.00 packs/day for 48 years    Types: Cigarettes    Quit date: 03/09/1998  . Smokeless tobacco: Never  Used  . Alcohol Use: No     Comment: quit: 1999  . Drug Use: No  . Sexual Activity: No   Other Topics Concern  . Not on file   Social History Narrative   Pt lives in [place] with spouse.  Retired.  Prior Biochemist, clinical for [employer].   Caffeine Use: once daily   Married 20 years    Family History  Problem Relation Age of Onset  . Ovarian cancer    . Pancreatic cancer    . Lung cancer    . Cancer Mother     ovarian  . Ovarian cancer Mother   . Cancer Father     pancreatic  . Pancreatic cancer Father   . Cancer Brother     lung    Physical Exam: Filed Vitals:   07/30/14 1534  BP: 100/60  Pulse: 91  Height: 5\' 9"  (1.753 m)  Weight: 200 lb 12.8 oz (91.082 kg)    GEN- The patient is well appearing, alert but confused, difficulty with expressive aphasia Head- normocephalic, atraumatic Eyes-  Sclera clear, conjunctiva pink Ears- hearing intact Oropharynx- clear Neck-  supple, no JVP Lymph- no cervical lymphadenopathy Lungs- Clear to ausculation bilaterally, normal work of breathing Chest- pacemaker pocket is well healed Heart- irregular rate and rhythm, no murmurs, rubs or gallops, PMI not laterally displaced GI- soft, NT, ND, + BS Extremities- no clubbing, cyanosis, +1 edema Very flat affect  Pacemaker interrogation- reviewed in detail today,  See PACEART report ekg today reveals afib with V pacing  Assessment and Plan:  1. Tachycardia/ bradycardia syndrome Normal pacemaker function See Pace Art report No changes today V rates are frequently elevated Increase diltiazem to 360mg  daily   2. Permanent Afib Continue anticoagulation long term Check bmet/ CBC today on pradaxa  3. Chronic diastolic dysfunction Stable Hopefully will improve with better V rate control Increase diltiazem as above Given low BP, will decrease lasix to 20mg  daily bmet  4. Hypertension Stable No change required today  5. Cad No ischemic symptoms No changes today  6. Hypokalemia Reduce K supplementation with reduction in lasix dosing bmet today  Follow-up with Dr Marlou Porch in 4-6 weeks to make sure that he tolerates the above changes. carelink every 3 months I will see again in 1 year

## 2014-07-30 NOTE — Patient Instructions (Addendum)
Your physician recommends that you schedule a follow-up appointment in: 4-6 weeks with Dr Marlou Porch    Remote monitoring is used to monitor your pacemaker from home. This monitoring reduces the number of office visits required to check your device to one time per year. It allows [place] to keep an eye on the functioning of your device to ensure it is working properly. You are scheduled for a device check from home on 10/31/2014. You may send your transmission at any time that day. If you have a wireless device, the transmission will be sent automatically. After your physician reviews your transmission, you will receive a postcard with your next transmission date.  Your physician recommends that you schedule a follow-up appointment in: 12 months with Dr.Allred  Your physician recommends that you return for lab work today: BMP/CBC  Your physician has recommended you make the following change in your medication:  1) Decrease Furosemide to 20mg  daily 2) Decrease Potassium to 79meq daily 3) Increase Diltiazem to 360mg  daily

## 2014-07-31 LAB — BASIC METABOLIC PANEL
BUN: 23 mg/dL (ref 6–23)
CO2: 30 mEq/L (ref 19–32)
Calcium: 9.1 mg/dL (ref 8.4–10.5)
Chloride: 109 mEq/L (ref 96–112)
Creatinine, Ser: 1.3 mg/dL (ref 0.4–1.5)
GFR: 54.56 mL/min — ABNORMAL LOW (ref >60.00)
Glucose, Bld: 100 mg/dL — ABNORMAL HIGH (ref 70–99)
POTASSIUM: 4.2 meq/L (ref 3.5–5.1)
Sodium: 143 mEq/L (ref 135–145)

## 2014-07-31 LAB — CBC WITH DIFFERENTIAL/PLATELET
Basophils Absolute: 0 10*3/uL (ref 0.0–0.1)
Basophils Relative: 0.2 % (ref 0.0–3.0)
Eosinophils Absolute: 0.1 10*3/uL (ref 0.0–0.7)
Eosinophils Relative: 0.9 % (ref 0.0–5.0)
HEMATOCRIT: 42.2 % (ref 39.0–52.0)
Hemoglobin: 13.5 g/dL (ref 13.0–17.0)
Lymphocytes Relative: 20 % (ref 12.0–46.0)
Lymphs Abs: 1.8 10*3/uL (ref 0.7–4.0)
MCHC: 31.9 g/dL (ref 30.0–36.0)
MCV: 90.8 fl (ref 78.0–100.0)
Monocytes Absolute: 0.8 10*3/uL (ref 0.1–1.0)
Monocytes Relative: 9.7 % (ref 3.0–12.0)
Neutro Abs: 6.1 10*3/uL (ref 1.4–7.7)
Neutrophils Relative %: 69.2 % (ref 43.0–77.0)
PLATELETS: 214 10*3/uL (ref 150.0–400.0)
RBC: 4.65 Mil/uL (ref 4.22–5.81)
RDW: 15.1 % (ref 11.5–15.5)
WBC: 8.8 10*3/uL (ref 4.0–10.5)

## 2014-08-02 ENCOUNTER — Other Ambulatory Visit: Payer: Self-pay | Admitting: *Deleted

## 2014-08-02 DIAGNOSIS — I10 Essential (primary) hypertension: Secondary | ICD-10-CM

## 2014-08-02 MED ORDER — DILTIAZEM HCL ER COATED BEADS 180 MG PO CP24: ORAL_CAPSULE | ORAL | Status: DC

## 2014-08-08 ENCOUNTER — Ambulatory Visit: Payer: Medicare Other | Attending: Internal Medicine

## 2014-08-09 ENCOUNTER — Ambulatory Visit (HOSPITAL_COMMUNITY)
Admission: RE | Admit: 2014-08-09 | Discharge: 2014-08-09 | Disposition: A | Discharge status: Home / Self Care | Payer: Medicare Other | Source: Ambulatory Visit | Attending: Internal Medicine | Admitting: Internal Medicine

## 2014-08-09 DIAGNOSIS — Z5189 Encounter for other specified aftercare: Secondary | ICD-10-CM | POA: Diagnosis not present

## 2014-08-09 DIAGNOSIS — F0390 Unspecified dementia without behavioral disturbance: Secondary | ICD-10-CM | POA: Diagnosis not present

## 2014-08-09 DIAGNOSIS — I69920 Aphasia following unspecified cerebrovascular disease: Secondary | ICD-10-CM | POA: Insufficient documentation

## 2014-08-09 DIAGNOSIS — F068 Other specified mental disorders due to known physiological condition: Secondary | ICD-10-CM | POA: Insufficient documentation

## 2014-08-09 NOTE — Progress Notes (Signed)
Clinical/Bedside Swallow Evaluation Patient Details  Name: Zachary Warren MRN: [id number] Date of Birth: Dec 29, 1932  Today's Date: 08/09/2014 Time: 3:17 PM  - 4:10 PM    Past Medical History:  Past Medical History  Diagnosis Date  . CVA (cerebral infarction) 1999  . DDD (degenerative disc disease), cervical   . Memory impairment   . Persistent atrial fibrillation     on pradaxa  . Coronary artery disease     s/p 5v CABG 2002  . Depression   . GERD (gastroesophageal reflux disease)   . Hyperlipidemia   . Hypertension   . PVD (peripheral vascular disease)   . Tachycardia-bradycardia     s/p PPM by Zachary Warren  . OSA (obstructive sleep apnea)     Recently diagnosed though pt. has not had CPAP trial  . Seizures   . Pacemaker   . Prostate cancer 2006    s/p XRT   Past Surgical History:  Past Surgical History  Procedure Laterality Date  . Coronary artery bypass graft  2002  . Cea  1999  . Pacemaker insertion      implanted by Zachary Warren  . Insert / replace / remove pacemaker    . Cardioversion N/A 08/21/2013    Procedure: CARDIOVERSION;  Surgeon: Zachary Furbish, MD;  Location: [facility] ENDOSCOPY;  Service: Cardiovascular;  Laterality: N/A;   HPI:  Symptoms/Limitations Symptoms: "We have not tried the thickened liquids." (pt's wife)  Prior Functional Status  Cognitive/Linguistic Baseline: Baseline deficits Baseline deficit details: previous stroke with residual mild aphasia; cognitive deficits/dementia   Lives With: Spouse Available Help at Discharge: Family  General  Date of Onset: 07/24/14 HPI: Zachary Warren is a 80 y.o. male with a hx of coronary artery disease status post bypass in 2003,and Atrial fibrillation cardioversion, x 3. He has a remote history of CVA in 1999 with residual aphasia. He was seen for an outpatient MBS at [facility] due to a three month decline in cognitive funciton, 30lb weight loss and frequent coughing with meals. Pt had left lobe pna in September and wife  suspected aspiration pna. She also reports pt is being treated for dementia. Recommendation for outpatient SLP therapy made s/p MBS for training with strategies. Zachary Warren is accompanied by his wife for today's appointment. She reports that pt has had a baseline cough/congestion since his stroke in 1999; pt is also a former smoker. Type of Study: Bedside swallow evaluation Diet Prior to this Study: Regular;Thin liquids (recommendation for NTL was disregarded) Temperature Spikes Noted: No Respiratory Status: Room air History of Recent Intubation: No Behavior/Cognition: Alert;Cooperative;Pleasant mood;Requires cueing (needs increased processing time) Oral Cavity - Dentition: Adequate natural dentition Self-Feeding Abilities: Able to feed self Patient Positioning: Upright in chair Baseline Vocal Quality: Clear Volitional Cough: Cognitively unable to elicit (difficult for pt to initiate likely due to residual apraxia) Volitional Swallow: Able to elicit (delayed)  Oral Motor/Sensory Function  Overall Oral Motor/Sensory Function: Appears within functional limits for tasks assessed (posterior lingual coating noted)  Consistency Results  Ice Chips Ice chips: Not tested  Thin Liquid Thin Liquid: Impaired Presentation: Cup;Self Fed;Straw Pharyngeal  Phase Impairments: Suspected delayed Swallow;Wet Vocal Quality;Cough - Delayed  Nectar Thick Liquid Nectar Thick Liquid: Not tested  Honey Thick Liquid Honey Thick Liquid: Not tested  Puree Puree: Within functional limits Presentation: Spoon;Self Fed  SLP Goals  Home Exercise SLP Goal: Patient will Perform Home Exercise Program: with supervision, verbal cues required/provided SLP Short Term Goals SLP Short Term Goal 1:  Pt will implement strategies reviewed in session for increasing safety with swallow with min verbal cues (from clinican or spouse). SLP Long Term Goals SLP Long Term Goal 1: Pt will demonstrate safe and efficient swallow  with regular textures and thin liquids with use of compensatory strategies.  Assessment/Plan  Patient Active Problem List   Diagnosis Date Noted  . MCI (mild cognitive impairment) with memory loss 07/24/2014  . Obesity 12/25/2013  . Cardiac pacemaker in situ 12/25/2013  . Aneurysm 06/28/2012  . SIRS (systemic inflammatory response syndrome) 02/05/2012  . Encephalopathy acute 02/05/2012  . CVA (cerebral infarction) 02/05/2012  . Seizure 02/05/2012  . Dysphagia as late effect of stroke 02/05/2012  . GERD (gastroesophageal reflux disease) 02/05/2012  . 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 HYPERLIPIDEMIA-MIXED 11/21/2010  . Essential hypertension 11/21/2010  . CAD, NATIVE VESSEL 11/21/2010  . Atrial fibrillation 11/21/2010  . PVD 11/21/2010   SLP - End of Session Activity Tolerance: Patient tolerated treatment well  SLP Assessment/Plan  Clinical Impression Statement: Zachary Warren had MBSS 07/24/2014 with recommendation for regular textures and nectar-thick liquids. Wife was given the option of using NTL or thin with chin tuck and she chose NTL, however they have not been using thickener at home. When SLP inquired why, Zachary Warren stated that his doctor emphasized increasing liquids for kidney function and that her husband would not drink them (but they never tried). SLP reviewed MBSS results with pt/wife and presented negative implications of aspiraiton pneumonia. Zachary Warren says that she removed straws and that he seems to be doing better- no longer has coughing episodes. SLP pointed out that aspiration was silent (ie. no cough elicited) during MBSS. Both the patient and wife would like to stay with thin liquids and want SLP to "fix" his swallowing so that he can be safe on them. MBSS report indicated no pharyngeal weakness, primarily sensory related with delay in swallow initiation. Pt assessed with thin liquids today via small cup sips with and without chin tuck and chin tuck  with small straw sips.   Given that pt wishes to avoid NTL, will proceed with thin liquids at home with use of chin tuck. Written cues were provided on index cards for pt to use at home and wife will supervise. Pt cued to cough periodically, however he has difficulty doing this likely do to residual deficits from previous stroke (?apraxia). Pt with notieceable congestion, however wife reports that "this is his baseline for several years". This is concerning to SLP in light of recent PNA, however will honor pt/family wishes to remain on thin liquids but with strict use of strategies. Wife and pt in agreement with plan. Should the pt show increased signs of congestion and fever, pt will need to thicken liquids to nectar consistency. SLP provided written instructions and pt will return in 2 weeks to monitor safety with thin liquids.  Speech Therapy Frequency: min 1 x/week Duration:  (2x month for 2 months) Treatment/Interventions: Aspiration precaution training;Diet toleration management by SLP;Compensatory techniques;Trials of upgraded texture/liquids;Cueing hierarchy;SLP instruction and feedback;Compensatory strategies;Patient/family education Potential to Achieve Goals: Good Potential Considerations: Family/community support  Thank you,  Genene Churn, [place] 08/09/2014,5:22 PM

## 2014-08-21 ENCOUNTER — Ambulatory Visit (HOSPITAL_COMMUNITY)
Admission: RE | Admit: 2014-08-21 | Discharge: 2014-08-21 | Disposition: A | Discharge status: Home / Self Care | Payer: Medicare Other | Source: Ambulatory Visit | Attending: Internal Medicine | Admitting: Internal Medicine

## 2014-08-21 ENCOUNTER — Other Ambulatory Visit: Payer: Self-pay | Admitting: Cardiology

## 2014-08-21 ENCOUNTER — Encounter (HOSPITAL_COMMUNITY): Payer: Self-pay | Admitting: Speech Pathology

## 2014-08-21 DIAGNOSIS — Z5189 Encounter for other specified aftercare: Secondary | ICD-10-CM | POA: Diagnosis not present

## 2014-08-21 DIAGNOSIS — F068 Other specified mental disorders due to known physiological condition: Secondary | ICD-10-CM | POA: Diagnosis not present

## 2014-08-21 DIAGNOSIS — I69920 Aphasia following unspecified cerebrovascular disease: Secondary | ICD-10-CM | POA: Diagnosis not present

## 2014-08-21 DIAGNOSIS — R1314 Dysphagia, pharyngoesophageal phase: Secondary | ICD-10-CM

## 2014-08-21 DIAGNOSIS — F0390 Unspecified dementia without behavioral disturbance: Secondary | ICD-10-CM | POA: Diagnosis not present

## 2014-08-21 NOTE — Therapy (Signed)
Speech Language Pathology Treatment/Discharge Summary  Patient Details  Name: Zachary Warren MRN: [id number] Date of Birth: 01/13/1933  Encounter Date: 08/21/2014      End of Session - 08/21/14 1827    Visit Number 2   Number of Visits 4   Date for SLP Re-Evaluation 10/04/14   Authorization Type Medicare   SLP Start Time 1350   SLP Time Calculation (min) 1430   SLP Time Calculation (min) 40 min   Activity Tolerance Patient tolerated treatment well      Past Medical History  Diagnosis Date  . CVA (cerebral infarction) 1999  . DDD (degenerative disc disease), cervical   . Memory impairment   . Persistent atrial fibrillation     on pradaxa  . Coronary artery disease     s/p 5v CABG 2002  . Depression   . GERD (gastroesophageal reflux disease)   . Hyperlipidemia   . Hypertension   . PVD (peripheral vascular disease)   . 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 OSA (obstructive sleep apnea)     Recently diagnosed though pt. has not had CPAP trial  . Seizures   . Pacemaker   . Prostate cancer 2006    s/p XRT    Past Surgical History  Procedure Laterality Date  . Coronary artery bypass graft  2002  . Cea  1999  . Pacemaker insertion      implanted by JA 6/12  . Insert / replace / remove pacemaker    . Cardioversion N/A 08/21/2013    Procedure: CARDIOVERSION;  Surgeon: Candee Furbish, MD;  Location: [facility] ENDOSCOPY;  Service: Cardiovascular;  Laterality: N/A;    There were no vitals taken for this visit.  Visit Diagnosis: Dysphagia        ADULT SLP TREATMENT - 08/21/14 1823    General Information   Behavior/Cognition Alert;Cooperative;Pleasant mood;Requires cueing   Patient Positioning Upright in chair   Oral care provided N/A   HPI Zachary Warren is a 80 y.o. male with a hx of coronary artery disease status post bypass in 2003,and Atrial fibrillation cardioversion, x 3. He has a remote history of CVA in 1999 with residual aphasia. He was seen for an  outpatient MBS at [facility] due to a three month decline in cognitive funciton, 30lb weight loss and frequent coughing with meals. Pt had left lobe pna in September and wife suspected aspiration pna. She also reports pt is being treated for dementia. Recommendation for outpatient SLP therapy made s/p MBS for training with strategies. Zachary Warren is accompanied by his wife for today's appointment. She reports that pt has had a baseline cough/congestion since his stroke in 1999; pt is also a former smoker.   Treatment Provided   Treatment provided Dysphagia   Dysphagia Treatment   Temperature Spikes Noted No   Respiratory Status Room air   Oral Cavity - Dentition Adequate natural dentition   Treatment Methods Skilled observation;Upgraded PO texture trial;Compensation strategy training;Patient/caregiver education   Patient observed directly with PO's Yes   Type of PO's observed Thin liquids   Feeding Able to feed self   Liquids provided via Cup;No straw   Pharyngeal Phase Signs & Symptoms Suspected delayed swallow initiation   Type of cueing Verbal;Visual   Amount of cueing Minimal   Other treatment/comments Pt instructed to implement chin tuck while taking small cup sips.   Pain Assessment   Pain Assessment No/denies pain   Assessment / Recommendations / Plan  Plan All goals met;Discharge SLP treatment due to (comment)  Good understanding of strategies.   Dysphagia Recommendations   Diet recommendations Regular;Thin liquid   Liquids provided via Cup;No straw   Medication Administration Whole meds with puree   Supervision Patient able to self feed   Compensations Small sips/bites;Slow rate   Postural Changes and/or Swallow Maneuvers Out of bed for meals;Seated upright 90 degrees;Upright 30-60 min after meal;Chin tuck   General Recommendations   Oral Care Recommendations Oral care BID   Follow up Recommendations None   Progression Toward Goals   Progression toward goals Goals met and updated                 Plan - 09/14/2014 1828    Clinical Impression Statement Zachary Warren had MBSS 07/24/2014 with recommendation for regular textures and nectar-thick liquids. Wife was given the option of using NTL or thin with chin tuck and she chose NTL, however once the were home they changed their minds and have not been using thickener at home. When SLP inquired why, Zachary Warren stated that his doctor emphasized increasing liquids for kidney function and that her husband would not drink them (but they never tried). SLP reviewed MBSS results with pt/wife acknowledge negative implications of aspiraiton pneumonia. During his previous visit, pt/wife agreed to proceed with thin liquids with use of chin tuck and remove the straw. His wife reports that she only counted two "choking/coughing" episodes in the last two weeks. One occured when he was taking his medication with thin liquids. SLP reminded them to take pills in puree or with thicker liquid (V-8/buttermilk), however question follow through with this at home. Zachary Warren was hoping that pt would be given some exercises to complete at home to help his swallowing, however SLP explained that results of the objective study (MBSS) show that pt does not have any weakness, just a delay in swallow initiaton which is the reason for the chin to chest posture. Zachary Warren were given education and recommendations in written form and were told where to purchase thickener should they decide to present his medications in thickened liquid. Zachary Warren reiterates that her husband will only drink thickened liquids as a "very last resort" and plan to continue implementing safe swallow strategies at home (small sips, chin tuck, no straw). Recommend discharge from therapy at this time as pt/family education has been completed. Pt's lungs and temps should be monitored periodically due to propensity for aspiration. Pt/wife understand that if pt becomes ill, it  will be even more prudent to follow aspiration precautions and thicken liquids if needed.           G-Codes - 09/14/14 1842    Functional Assessment Tool Used clinical judgement   Functional Limitations Swallowing   Swallow Current Status [phone]) At least 1 percent but less than 20 percent impaired, limited or restricted   Swallow Goal Status (V2536) At least 1 percent but less than 20 percent impaired, limited or restricted   Swallow Discharge Status [phone]) At least 1 percent but less than 20 percent impaired, limited or restricted      Problem List Patient Active Problem List   Diagnosis Date Noted  . MCI (mild cognitive impairment) with memory loss 07/24/2014  . Obesity 12/25/2013  . Cardiac pacemaker in situ 12/25/2013  . Aneurysm 06/28/2012  . SIRS (systemic inflammatory response syndrome) 02/05/2012  . Encephalopathy acute 02/05/2012  . CVA (cerebral infarction) 02/05/2012  . Seizure 02/05/2012  . Dysphagia  as late effect of stroke 02/05/2012  . GERD (gastroesophageal reflux disease) 02/05/2012  . 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 HYPERLIPIDEMIA-MIXED 11/21/2010  . Essential hypertension 11/21/2010  . CAD, NATIVE VESSEL 11/21/2010  . Atrial fibrillation 11/21/2010  . PVD 11/21/2010   Thank you,  Genene Churn, [place]                                     [place] 08/21/2014, 6:45 PM

## 2014-08-28 ENCOUNTER — Encounter: Payer: Self-pay | Admitting: Cardiology

## 2014-08-28 ENCOUNTER — Ambulatory Visit (INDEPENDENT_AMBULATORY_CARE_PROVIDER_SITE_OTHER): Payer: Medicare Other | Admitting: Cardiology

## 2014-08-28 VITALS — BP 118/78 | HR 77 | Ht 69.0 in | Wt 202.0 lb

## 2014-08-28 DIAGNOSIS — I481 Persistent atrial fibrillation: Secondary | ICD-10-CM

## 2014-08-28 DIAGNOSIS — I2510 Atherosclerotic heart disease of native coronary artery without angina pectoris: Secondary | ICD-10-CM | POA: Diagnosis not present

## 2014-08-28 DIAGNOSIS — I10 Essential (primary) hypertension: Secondary | ICD-10-CM | POA: Diagnosis not present

## 2014-08-28 DIAGNOSIS — I4819 Other persistent atrial fibrillation: Secondary | ICD-10-CM

## 2014-08-28 DIAGNOSIS — E669 Obesity, unspecified: Secondary | ICD-10-CM | POA: Diagnosis not present

## 2014-08-28 NOTE — Progress Notes (Signed)
[facility]. [address] [place], [place]  [address] Phone: [phone] Fax:  [phone]  Date:  08/28/2014   ID:  Zachary Warren, DOB 04/05/1933, MRN [id number]  PCP:  Wenda Low, MD   History of Present Illness: Zachary Warren is a 80 y.o. male with a hx of coronary artery disease status post bypass in 2003,and Atrial fibrillation cardioversion, x 3, failed amiodarone secondary to return of atrial fibrillation despite maintenance therapy, discontinuation of amiodarone on 11/14 with no change in symptoms here for followup. He's currently on anticoagulation with Pradaxa. He had pacemaker placed by Dr. Rayann Heman 6/12.   His pacemaker is functioning well. He had it checked and he is being paced 96% of the time.  He was in the hospital for fever of unknown origin, as high as 104? he is doing better. He has been complaining of some fatigue. Blood pressure has been running slightly low.  On 10/16/13 he maintained his atrial fibrillation but his heart rate is now 101 beats per minute. I decided to start him on diltiazem CD 120 mg. He reports no syncope, no fevers, no chills, no significant shortness of breath. He has been battling a cough with mucous production.  12/25/13-diltiazem increased previously 240 mg once a day. Better overall rate control. He did have a brief 1 minute episode of left-sided chest discomfort fleeting at rest. No further occurrence. Non-exertional. Continuing to monitor. He has done an excellent job with weight loss.  08/28/14-reviewed Dr. Jackalyn Lombard prior note from 07/30/14-heart rate was elevated. He increased diltiazem to 360. Lasix was decreased to 20 mg a day because of hypotension. Potassium supplement was also decreased at that time as well.    Wt Readings from Last 3 Encounters:  08/28/14 202 lb (91.627 kg)  07/30/14 200 lb 12.8 oz (91.082 kg)  07/24/14 203 lb 9.6 oz (92.352 kg)     Past Medical History  Diagnosis Date  . CVA (cerebral infarction)  1999  . DDD (degenerative disc disease), cervical   . Memory impairment   . Persistent atrial fibrillation     on pradaxa  . Coronary artery disease     s/p 5v CABG 2002  . Depression   . GERD (gastroesophageal reflux disease)   . Hyperlipidemia   . Hypertension   . PVD (peripheral vascular disease)   . 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 OSA (obstructive sleep apnea)     Recently diagnosed though pt. has not had CPAP trial  . Seizures   . Pacemaker   . Prostate cancer 2006    s/p XRT    Past Surgical History  Procedure Laterality Date  . Coronary artery bypass graft  2002  . Cea  1999  . Pacemaker insertion      implanted by JA 6/12  . Insert / replace / remove pacemaker    . Cardioversion N/A 08/21/2013    Procedure: CARDIOVERSION;  Surgeon: Candee Furbish, MD;  Location: [facility] ENDOSCOPY;  Service: Cardiovascular;  Laterality: N/A;    Current Outpatient Prescriptions  Medication Sig Dispense Refill  . atorvastatin (LIPITOR) 40 MG tablet TAKE 1 TABLET BY MOUTH ONCE DAILY 30 tablet 5  . diltiazem (CARDIZEM CD) 180 MG 24 hr capsule Take 2 by mouth once daily 180 capsule 3  . FLUoxetine (PROZAC) 40 MG capsule Take 40 mg by mouth daily.      . furosemide (LASIX) 40 MG tablet Take 0.5 tablets (20 mg  total) by mouth daily. 30 tablet   . Lutein 20 MG CAPS Take 1 capsule by mouth daily.     . Multiple Vitamins-Minerals (MULTIVITAMIN WITH MINERALS) tablet Take 1 tablet by mouth daily. Chewable flinstones    . omeprazole (PRILOSEC OTC) 20 MG tablet Take 20 mg by mouth 2 (two) times daily.      . potassium chloride SA (K-DUR,KLOR-CON) 20 MEQ tablet Take 1 tablet (20 mEq total) by mouth daily. 60 tablet 6  . PRADAXA 150 MG CAPS capsule TAKE 1 CAPSULE BY MOUTH TWICE DAILY 60 capsule 11  . topiramate (TOPAMAX) 50 MG tablet Take 1 tablet (50 mg total) by mouth 2 (two) times daily. 180 tablet 4   No current facility-administered medications for this visit.    Allergies:      Allergies  Allergen Reactions  . Ramipril Other (See Comments)     cough    Social History:  The patient  reports that he quit smoking about 16 years ago. His smoking use included Cigarettes. He has a 96 pack-year smoking history. He has never used smokeless tobacco. He reports that he does not drink alcohol or use illicit drugs.   ROS:  Please see the history of present illness.   Positive for atrial fibrillation, occasional fatigue. No change after cardioversion.  PHYSICAL EXAM: VS:  BP 118/78 mmHg  Pulse 77  Ht 5\' 9"  (1.753 m)  Wt 202 lb (91.627 kg)  BMI 29.82 kg/m2 Well nourished, well developed, in no acute distress HEENT: normal Neck: no JVD Cardiac:  Irregularly irregular, no longer tachycardic; no murmur Lungs:  Upper airway congestion notedLeft mild crackles. Abd: soft, nontender, no hepatomegalyOverweight, improved Ext: no edema Skin: warm and dry Neuro: no focal abnormalities noted  EKG:  10/16/13-heart rate 103, atrial fibrillation     ASSESSMENT AND PLAN:  1. Atrial fibrillation- He is in persistent atrial fibrillation. Rate control has been somewhat challenging off of amiodarone. He did not feel any different after cardioversion in 11/14. Took him off of antiarrhythmic amiodarone because of persistent A. fib. Hence, we stopped his amiodarone in December of 2015. On diltiazem CD 360 mg once a day for improved rate control. Improved. He has been on Toprol in the past and I do believe that we have stopped this because of potential fatigue. 2. Chronic anticoagulation- Pradaxa. Keeping up with lab work. 3. CAD - stable, no angina 4. Hyperlipidemia - LDL goal 70. Checking lipids. 5. Tachycardia/bradycardia syndrome-pacemaker functioning well. 6. Obesity-Dr. Lysle Rubens encourage weight loss and he is doing quite well losing. BMI today <30! 7. Upper airway congestion/mucous production-encouraged Mucinex. Do not use decongestant for him. 8. We'll see him back in 6  months.  Signed,         Candee Furbish, MD [facility]  08/28/2014 2:08 PM

## 2014-08-28 NOTE — Patient Instructions (Signed)
The current medical regimen is effective;  continue present plan and medications.  Follow up in 6 months with Dr. Skains.  You will receive a letter in the mail 2 months before you are due.  Please call us when you receive this letter to schedule your follow up appointment.  

## 2014-08-30 ENCOUNTER — Telehealth: Payer: Self-pay

## 2014-08-30 NOTE — Telephone Encounter (Signed)
Patient's wife called to request samples of pradaxa 150mg  placed a month of samples up front

## 2014-08-31 ENCOUNTER — Encounter (HOSPITAL_COMMUNITY): Payer: Self-pay | Admitting: Emergency Medicine

## 2014-08-31 ENCOUNTER — Emergency Department (INDEPENDENT_AMBULATORY_CARE_PROVIDER_SITE_OTHER)
Admission: EM | Admit: 2014-08-31 | Discharge: 2014-08-31 | Disposition: A | Discharge status: Home / Self Care | Payer: Medicare Other | Source: Home / Self Care | Attending: Family Medicine | Admitting: Family Medicine

## 2014-08-31 DIAGNOSIS — J40 Bronchitis, not specified as acute or chronic: Secondary | ICD-10-CM

## 2014-08-31 DIAGNOSIS — G4733 Obstructive sleep apnea (adult) (pediatric): Secondary | ICD-10-CM

## 2014-08-31 DIAGNOSIS — N390 Urinary tract infection, site not specified: Secondary | ICD-10-CM

## 2014-08-31 LAB — POCT URINALYSIS DIP (DEVICE)
BILIRUBIN URINE: NEGATIVE
Glucose, UA: NEGATIVE mg/dL
KETONES UR: NEGATIVE mg/dL
Leukocytes, UA: NEGATIVE
Nitrite: NEGATIVE
PH: 6 (ref 5.0–8.0)
Protein, ur: 30 mg/dL — AB
SPECIFIC GRAVITY, URINE: 1.02 (ref 1.005–1.030)
Urobilinogen, UA: 0.2 mg/dL (ref 0.0–1.0)

## 2014-08-31 MED ORDER — IPRATROPIUM-ALBUTEROL 0.5-2.5 (3) MG/3ML IN SOLN
RESPIRATORY_TRACT | Status: AC
  Filled 2014-08-31: qty 3

## 2014-08-31 MED ORDER — IPRATROPIUM-ALBUTEROL 0.5-2.5 (3) MG/3ML IN SOLN
3.0000 mL | Freq: Once | RESPIRATORY_TRACT | Status: AC
  Administered 2014-08-31: 3 mL via RESPIRATORY_TRACT

## 2014-08-31 MED ORDER — PREDNISONE 10 MG PO TABS: 30.0000 mg | ORAL_TABLET | Freq: Every day | ORAL | Status: DC

## 2014-08-31 MED ORDER — CEPHALEXIN 500 MG PO CAPS: 500.0000 mg | ORAL_CAPSULE | Freq: Three times a day (TID) | ORAL | Status: DC

## 2014-08-31 NOTE — Discharge Instructions (Signed)
Thank you for coming in today. Take prednisone daily. Take Keflex for UTI. Use albuterol every 6 hours or so as needed Follow-up with primary care doctor  Urinary Tract Infection Urinary tract infections (UTIs) can develop anywhere along your urinary tract. Your urinary tract is your body's drainage system for removing wastes and extra water. Your urinary tract includes two kidneys, two ureters, a bladder, and a urethra. Your kidneys are a pair of bean-shaped organs. Each kidney is about the size of your fist. They are located below your ribs, one on each side of your spine. CAUSES Infections are caused by microbes, which are microscopic organisms, including fungi, viruses, and bacteria. These organisms are so small that they can only be seen through a microscope. Bacteria are the microbes that most commonly cause UTIs. SYMPTOMS  Symptoms of UTIs may vary by age and gender of the patient and by the location of the infection. Symptoms in young women typically include a frequent and intense urge to urinate and a painful, burning feeling in the bladder or urethra during urination. Older women and men are more likely to be tired, shaky, and weak and have muscle aches and abdominal pain. A fever may mean the infection is in your kidneys. Other symptoms of a kidney infection include pain in your back or sides below the ribs, nausea, and vomiting. DIAGNOSIS To diagnose a UTI, your caregiver will ask you about your symptoms. Your caregiver also will ask to provide a urine sample. The urine sample will be tested for bacteria and white blood cells. White blood cells are made by your body to help fight infection. TREATMENT  Typically, UTIs can be treated with medication. Because most UTIs are caused by a bacterial infection, they usually can be treated with the use of antibiotics. The choice of antibiotic and length of treatment depend on your symptoms and the type of bacteria causing your infection. HOME CARE  INSTRUCTIONS  If you were prescribed antibiotics, take them exactly as your caregiver instructs you. Finish the medication even if you feel better after you have only taken some of the medication.  Drink enough water and fluids to keep your urine clear or pale yellow.  Avoid caffeine, tea, and carbonated beverages. They tend to irritate your bladder.  Empty your bladder often. Avoid holding urine for long periods of time.  Empty your bladder before and after sexual intercourse.  After a bowel movement, women should cleanse from front to back. Use each tissue only once. SEEK MEDICAL CARE IF:   You have back pain.  You develop a fever.  Your symptoms do not begin to resolve within 3 days. SEEK IMMEDIATE MEDICAL CARE IF:   You have severe back pain or lower abdominal pain.  You develop chills.  You have nausea or vomiting.  You have continued burning or discomfort with urination. MAKE SURE YOU:   Understand these instructions.  Will watch your condition.  Will get help right away if you are not doing well or get worse. Document Released: 07/08/2005 Document Revised: 03/29/2012 Document Reviewed: 11/06/2011 [facility] Patient Information 2015 [place], [place]. This information is not intended to replace advice given to you by your health care provider. Make sure you discuss any questions you have with your health care provider.

## 2014-08-31 NOTE — ED Notes (Signed)
Pt states that he has to go to the restroom every 30 minutes. He complains of frequency and urgency.

## 2014-08-31 NOTE — ED Provider Notes (Signed)
Zachary Warren is a 80 y.o. male who presents to Urgent Care today for urinary frequency and urgency started over the last few days associated with chills and back pain. No fevers or chills. Patient additionally notes coughing congestion and rattling breathing. His history problems have been ongoing for months. No chest pain or palpitations. Patient feels well otherwise. No medications tried yet.   Past Medical History  Diagnosis Date  . CVA (cerebral infarction) 1999  . DDD (degenerative disc disease), cervical   . Memory impairment   . Persistent atrial fibrillation     on pradaxa  . Coronary artery disease     s/p 5v CABG 2002  . Depression   . GERD (gastroesophageal reflux disease)   . Hyperlipidemia   . Hypertension   . PVD (peripheral vascular disease)   . 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 OSA (obstructive sleep apnea)     Recently diagnosed though pt. has not had CPAP trial  . Seizures   . Pacemaker   . Prostate cancer 2006    s/p XRT   Past Surgical History  Procedure Laterality Date  . Coronary artery bypass graft  2002  . Cea  1999  . Pacemaker insertion      implanted by JA 6/12  . Insert / replace / remove pacemaker    . Cardioversion N/A 08/21/2013    Procedure: CARDIOVERSION;  Surgeon: Candee Furbish, MD;  Location: [facility] ENDOSCOPY;  Service: Cardiovascular;  Laterality: N/A;   History  Substance Use Topics  . Smoking status: Former Smoker -- 2.00 packs/day for 48 years    Types: Cigarettes    Quit date: 03/09/1998  . Smokeless tobacco: Never Used  . Alcohol Use: No     Comment: quit: 1999   ROS as above Medications: No current facility-administered medications for this encounter.   Current Outpatient Prescriptions  Medication Sig Dispense Refill  . atorvastatin (LIPITOR) 40 MG tablet TAKE 1 TABLET BY MOUTH ONCE DAILY 30 tablet 5  . cephALEXin (KEFLEX) 500 MG capsule Take 1 capsule (500 mg total) by mouth 3 (three) times daily. 21 capsule 0   . diltiazem (CARDIZEM CD) 180 MG 24 hr capsule Take 2 by mouth once daily 180 capsule 3  . FLUoxetine (PROZAC) 40 MG capsule Take 40 mg by mouth daily.      . furosemide (LASIX) 40 MG tablet Take 0.5 tablets (20 mg total) by mouth daily. 30 tablet   . Lutein 20 MG CAPS Take 1 capsule by mouth daily.     . Multiple Vitamins-Minerals (MULTIVITAMIN WITH MINERALS) tablet Take 1 tablet by mouth daily. Chewable flinstones    . omeprazole (PRILOSEC OTC) 20 MG tablet Take 20 mg by mouth 2 (two) times daily.      . potassium chloride SA (K-DUR,KLOR-CON) 20 MEQ tablet Take 1 tablet (20 mEq total) by mouth daily. 60 tablet 6  . PRADAXA 150 MG CAPS capsule TAKE 1 CAPSULE BY MOUTH TWICE DAILY 60 capsule 11  . predniSONE (DELTASONE) 10 MG tablet Take 3 tablets (30 mg total) by mouth daily. 15 tablet 0  . topiramate (TOPAMAX) 50 MG tablet Take 1 tablet (50 mg total) by mouth 2 (two) times daily. 180 tablet 4   Allergies  Allergen Reactions  . Ramipril Other (See Comments)     cough     Exam:  BP 154/76 mmHg  Pulse 106  Temp(Src) 98.4 F (36.9 C) (Oral)  Resp 20  SpO2  94% Gen: Well NAD HEENT: EOMI,  MMM Lungs: Normal work of breathing. Coarse breath sounds. Heart: Mildly tachycardia irregular no MRG Abd: NABS, Soft. Nondistended, Nontender no CVA tenderness to percussion Exts: Brisk capillary refill, warm and well perfused.     patient was given a 2.5/0.5 mg DuoNeb nebulizer treatment and felt better.  Results for orders placed or performed during the hospital encounter of 08/31/14 (from the past 24 hour(s))  POCT urinalysis dip (device)     Status: Abnormal   Collection Time: 08/31/14  1:44 PM  Result Value Ref Range   Glucose, UA NEGATIVE NEGATIVE mg/dL   Bilirubin Urine NEGATIVE NEGATIVE   Ketones, ur NEGATIVE NEGATIVE mg/dL   Specific Gravity, Urine 1.020 1.005 - 1.030   Hgb urine dipstick MODERATE (A) NEGATIVE   pH 6.0 5.0 - 8.0   Protein, ur 30 (A) NEGATIVE mg/dL   Urobilinogen,  UA 0.2 0.0 - 1.0 mg/dL   Nitrite NEGATIVE NEGATIVE   Leukocytes, UA NEGATIVE NEGATIVE   No results found.  Assessment and Plan: 80 y.o. male with   1) urinary frequency and urgency likely due to UTI. Culture pending. Treatment with Keflex. 2) bronchitis. Treatment with albuterol and prednisone. Follow-up with PCP.  Discussed warning signs or symptoms. Please see discharge instructions. Patient expresses understanding.     Gregor Hams, MD 08/31/14 [phone]

## 2014-09-01 LAB — URINE CULTURE: SPECIAL REQUESTS: NORMAL

## 2014-09-03 ENCOUNTER — Other Ambulatory Visit: Payer: Self-pay | Admitting: Dermatology

## 2014-09-03 DIAGNOSIS — L304 Erythema intertrigo: Secondary | ICD-10-CM | POA: Diagnosis not present

## 2014-09-03 DIAGNOSIS — D225 Melanocytic nevi of trunk: Secondary | ICD-10-CM | POA: Diagnosis not present

## 2014-09-03 DIAGNOSIS — D485 Neoplasm of uncertain behavior of skin: Secondary | ICD-10-CM | POA: Diagnosis not present

## 2014-09-03 DIAGNOSIS — L570 Actinic keratosis: Secondary | ICD-10-CM | POA: Diagnosis not present

## 2014-09-25 ENCOUNTER — Telehealth: Payer: Self-pay | Admitting: *Deleted

## 2014-09-25 DIAGNOSIS — I10 Essential (primary) hypertension: Secondary | ICD-10-CM | POA: Diagnosis not present

## 2014-09-25 DIAGNOSIS — F324 Major depressive disorder, single episode, in partial remission: Secondary | ICD-10-CM | POA: Diagnosis not present

## 2014-09-25 DIAGNOSIS — E782 Mixed hyperlipidemia: Secondary | ICD-10-CM | POA: Diagnosis not present

## 2014-09-25 DIAGNOSIS — I482 Chronic atrial fibrillation: Secondary | ICD-10-CM | POA: Diagnosis not present

## 2014-09-25 DIAGNOSIS — R350 Frequency of micturition: Secondary | ICD-10-CM | POA: Diagnosis not present

## 2014-09-25 DIAGNOSIS — N183 Chronic kidney disease, stage 3 (moderate): Secondary | ICD-10-CM | POA: Diagnosis not present

## 2014-09-25 DIAGNOSIS — K219 Gastro-esophageal reflux disease without esophagitis: Secondary | ICD-10-CM | POA: Diagnosis not present

## 2014-09-25 DIAGNOSIS — R7309 Other abnormal glucose: Secondary | ICD-10-CM | POA: Diagnosis not present

## 2014-09-25 NOTE — Telephone Encounter (Signed)
Pradaxa samples provided for patient.

## 2014-10-10 ENCOUNTER — Emergency Department (HOSPITAL_COMMUNITY): Payer: Medicare Other

## 2014-10-10 ENCOUNTER — Emergency Department (HOSPITAL_COMMUNITY)
Admission: EM | Admit: 2014-10-10 | Discharge: 2014-10-10 | Disposition: A | Discharge status: Home / Self Care | Payer: Medicare Other | Attending: Emergency Medicine | Admitting: Emergency Medicine

## 2014-10-10 ENCOUNTER — Telehealth: Payer: Self-pay | Admitting: Cardiology

## 2014-10-10 ENCOUNTER — Encounter (HOSPITAL_COMMUNITY): Payer: Self-pay | Admitting: *Deleted

## 2014-10-10 DIAGNOSIS — Z8546 Personal history of malignant neoplasm of prostate: Secondary | ICD-10-CM | POA: Diagnosis not present

## 2014-10-10 DIAGNOSIS — R531 Weakness: Secondary | ICD-10-CM | POA: Diagnosis not present

## 2014-10-10 DIAGNOSIS — Z950 Presence of cardiac pacemaker: Secondary | ICD-10-CM | POA: Insufficient documentation

## 2014-10-10 DIAGNOSIS — J159 Unspecified bacterial pneumonia: Secondary | ICD-10-CM | POA: Diagnosis not present

## 2014-10-10 DIAGNOSIS — Z7952 Long term (current) use of systemic steroids: Secondary | ICD-10-CM | POA: Insufficient documentation

## 2014-10-10 DIAGNOSIS — E785 Hyperlipidemia, unspecified: Secondary | ICD-10-CM | POA: Insufficient documentation

## 2014-10-10 DIAGNOSIS — R202 Paresthesia of skin: Secondary | ICD-10-CM | POA: Diagnosis not present

## 2014-10-10 DIAGNOSIS — K219 Gastro-esophageal reflux disease without esophagitis: Secondary | ICD-10-CM | POA: Diagnosis not present

## 2014-10-10 DIAGNOSIS — Z79899 Other long term (current) drug therapy: Secondary | ICD-10-CM | POA: Diagnosis not present

## 2014-10-10 DIAGNOSIS — I481 Persistent atrial fibrillation: Secondary | ICD-10-CM | POA: Diagnosis not present

## 2014-10-10 DIAGNOSIS — F329 Major depressive disorder, single episode, unspecified: Secondary | ICD-10-CM | POA: Diagnosis not present

## 2014-10-10 DIAGNOSIS — Z8673 Personal history of transient ischemic attack (TIA), and cerebral infarction without residual deficits: Secondary | ICD-10-CM | POA: Insufficient documentation

## 2014-10-10 DIAGNOSIS — J189 Pneumonia, unspecified organism: Secondary | ICD-10-CM | POA: Diagnosis not present

## 2014-10-10 DIAGNOSIS — I10 Essential (primary) hypertension: Secondary | ICD-10-CM | POA: Insufficient documentation

## 2014-10-10 DIAGNOSIS — I2510 Atherosclerotic heart disease of native coronary artery without angina pectoris: Secondary | ICD-10-CM | POA: Insufficient documentation

## 2014-10-10 DIAGNOSIS — Z792 Long term (current) use of antibiotics: Secondary | ICD-10-CM | POA: Insufficient documentation

## 2014-10-10 DIAGNOSIS — Z87891 Personal history of nicotine dependence: Secondary | ICD-10-CM | POA: Diagnosis not present

## 2014-10-10 DIAGNOSIS — Z951 Presence of aortocoronary bypass graft: Secondary | ICD-10-CM | POA: Diagnosis not present

## 2014-10-10 DIAGNOSIS — R918 Other nonspecific abnormal finding of lung field: Secondary | ICD-10-CM | POA: Diagnosis not present

## 2014-10-10 DIAGNOSIS — G40909 Epilepsy, unspecified, not intractable, without status epilepticus: Secondary | ICD-10-CM | POA: Diagnosis not present

## 2014-10-10 DIAGNOSIS — R29810 Facial weakness: Secondary | ICD-10-CM | POA: Diagnosis not present

## 2014-10-10 LAB — BASIC METABOLIC PANEL
Anion gap: 12 (ref 5–15)
BUN: 22 mg/dL (ref 6–23)
CO2: 19 mmol/L (ref 19–32)
Calcium: 8.9 mg/dL (ref 8.4–10.5)
Chloride: 110 mEq/L (ref 96–112)
Creatinine, Ser: 1.41 mg/dL — ABNORMAL HIGH (ref 0.50–1.35)
GFR calc Af Amer: 53 mL/min — ABNORMAL LOW (ref >90)
GFR calc non Af Amer: 46 mL/min — ABNORMAL LOW (ref >90)
Glucose, Bld: 93 mg/dL (ref 70–99)
Potassium: 3.3 mmol/L — ABNORMAL LOW (ref 3.5–5.1)
Sodium: 141 mmol/L (ref 135–145)

## 2014-10-10 LAB — CBC WITH DIFFERENTIAL/PLATELET
Basophils Absolute: 0 10*3/uL (ref 0.0–0.1)
Basophils Relative: 0 % (ref 0–1)
Eosinophils Absolute: 0 10*3/uL (ref 0.0–0.7)
Eosinophils Relative: 0 % (ref 0–5)
HCT: 41 % (ref 39.0–52.0)
Hemoglobin: 12.6 g/dL — ABNORMAL LOW (ref 13.0–17.0)
Lymphocytes Relative: 8 % — ABNORMAL LOW (ref 12–46)
Lymphs Abs: 1.4 10*3/uL (ref 0.7–4.0)
MCH: 28.3 pg (ref 26.0–34.0)
MCHC: 30.7 g/dL (ref 30.0–36.0)
MCV: 92.1 fL (ref 78.0–100.0)
Monocytes Absolute: 1.6 10*3/uL — ABNORMAL HIGH (ref 0.1–1.0)
Monocytes Relative: 10 % (ref 3–12)
Neutro Abs: 13.9 10*3/uL — ABNORMAL HIGH (ref 1.7–7.7)
Neutrophils Relative %: 82 % — ABNORMAL HIGH (ref 43–77)
Platelets: 195 10*3/uL (ref 150–400)
RBC: 4.45 MIL/uL (ref 4.22–5.81)
RDW: 14.9 % (ref 11.5–15.5)
WBC: 16.9 10*3/uL — ABNORMAL HIGH (ref 4.0–10.5)

## 2014-10-10 LAB — URINALYSIS, ROUTINE W REFLEX MICROSCOPIC
Glucose, UA: NEGATIVE mg/dL
Hgb urine dipstick: NEGATIVE
Ketones, ur: 15 mg/dL — AB
Leukocytes, UA: NEGATIVE
Nitrite: NEGATIVE
Protein, ur: 30 mg/dL — AB
Specific Gravity, Urine: 1.021 (ref 1.005–1.030)
Urobilinogen, UA: 0.2 mg/dL (ref 0.0–1.0)
pH: 5.5 (ref 5.0–8.0)

## 2014-10-10 LAB — URINE MICROSCOPIC-ADD ON

## 2014-10-10 MED ORDER — LEVOFLOXACIN IN D5W 500 MG/100ML IV SOLN
500.0000 mg | Freq: Once | INTRAVENOUS | Status: DC
  Filled 2014-10-10: qty 100

## 2014-10-10 MED ORDER — LEVOFLOXACIN 500 MG PO TABS: 500.0000 mg | ORAL_TABLET | Freq: Every day | ORAL | Status: DC

## 2014-10-10 MED ORDER — LEVOFLOXACIN 500 MG PO TABS
500.0000 mg | ORAL_TABLET | Freq: Once | ORAL | Status: AC
  Administered 2014-10-10: 500 mg via ORAL
  Filled 2014-10-10: qty 1

## 2014-10-10 NOTE — Telephone Encounter (Signed)
Spoke with wife - this am when pt got up about 7:15 am he "looked funny"  She reports his bottom jaw was hanging down and the pt stated his face felt numb from his eyes down to his throat.  She had him sit down to rest BP 89/56 HR 125, rechecked in 15 mins BP 100/50 HR 113.  She reports his normal HR is around 100 bpm.  He ate breakfast and told her "nothing tastes right."  His walking is his normal, smile is normal, able to stick his tongue out, grips equal for him and pt denies any weakness.  Wife reports recently he has been confused and didn't know where he was.  She spoke with his PCP about this and per her was told her may see more and more of this.  She called this AM and spoke with nurse at [facility] who instructed her to take pt to ED for evaluation.  I advised her he needs to be evaluated in ED ASAP was well.  She states he looks normal now but she will take him.  Trish notified.

## 2014-10-10 NOTE — Telephone Encounter (Signed)
New message     When pt got up this am his face was numb on both sides.  His mouth was drooping.  Wife called Dr Milana Na pcp.  His bp is 100/50 and HR was 113.  Pt is now sleeping.  Normally he eats breakfast and go back to sleep.  Pt ate a normal breakfast but told wife food taste funny.  Should pt be seen or go to the ER?

## 2014-10-10 NOTE — ED Provider Notes (Signed)
CSN: [id number]     Arrival date & time 10/10/14  1436 History   First MD Initiated Contact with Patient 10/10/14 1503     Chief Complaint  Patient presents with  . Weakness     (Consider location/radiation/quality/duration/timing/severity/associated sxs/prior Treatment) HPI   81yM with b/l facial droop? Noticed by wife this morning. Jaw seemed to be hanging/slack. Symptoms last about 1 hours and resolved around 1130. Pt doesn't think he felt like this when woke up. Pt insisted on getting ready before coming for evaluation which is reason for delay. Currently no complaints. Face felt normal when he shaved, but other symptoms had resolved by this time. Hx of dementia/aphasia. Wife reports he currently seems at his baseline.   Past Medical History  Diagnosis Date  . CVA (cerebral infarction) 1999  . DDD (degenerative disc disease), cervical   . Memory impairment   . Persistent atrial fibrillation     on pradaxa  . Coronary artery disease     s/p 5v CABG 2002  . Depression   . GERD (gastroesophageal reflux disease)   . Hyperlipidemia   . Hypertension   . PVD (peripheral vascular disease)   . 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 OSA (obstructive sleep apnea)     Recently diagnosed though pt. has not had CPAP trial  . Seizures   . Pacemaker   . Prostate cancer 2006    s/p XRT   Past Surgical History  Procedure Laterality Date  . Coronary artery bypass graft  2002  . Cea  1999  . Pacemaker insertion      implanted by JA 6/12  . Insert / replace / remove pacemaker    . Cardioversion N/A 08/21/2013    Procedure: CARDIOVERSION;  Surgeon: Candee Furbish, MD;  Location: [facility] ENDOSCOPY;  Service: Cardiovascular;  Laterality: N/A;   Family History  Problem Relation Age of Onset  . Ovarian cancer    . Pancreatic cancer    . Lung cancer    . Cancer Mother     ovarian  . Ovarian cancer Mother   . Cancer Father     pancreatic  . Pancreatic cancer Father   . Cancer  Brother     lung   History  Substance Use Topics  . Smoking status: Former Smoker -- 2.00 packs/day for 48 years    Types: Cigarettes    Quit date: 03/09/1998  . Smokeless tobacco: Never Used  . Alcohol Use: No     Comment: quit: 1999    Review of Systems  All systems reviewed and negative, other than as noted in HPI.   Allergies  Ramipril  Home Medications   Prior to Admission medications   Medication Sig Start Date End Date Taking? Authorizing Provider  atorvastatin (LIPITOR) 40 MG tablet TAKE 1 TABLET BY MOUTH ONCE DAILY 08/22/14   Candee Furbish, MD  cephALEXin (KEFLEX) 500 MG capsule Take 1 capsule (500 mg total) by mouth 3 (three) times daily. 08/31/14   Gregor Hams, MD  diltiazem (CARDIZEM CD) 180 MG 24 hr capsule Take 2 by mouth once daily 08/02/14   Thompson Grayer, MD  FLUoxetine (PROZAC) 40 MG capsule Take 40 mg by mouth daily.      Historical Provider, MD  furosemide (LASIX) 40 MG tablet Take 0.5 tablets (20 mg total) by mouth daily. 07/30/14   Thompson Grayer, MD  Lutein 20 MG CAPS Take 1 capsule by mouth daily.  Historical Provider, MD  Multiple Vitamins-Minerals (MULTIVITAMIN WITH MINERALS) tablet Take 1 tablet by mouth daily. Chewable flinstones    Historical Provider, MD  omeprazole (PRILOSEC OTC) 20 MG tablet Take 20 mg by mouth 2 (two) times daily.      Historical Provider, MD  potassium chloride SA (K-DUR,KLOR-CON) 20 MEQ tablet Take 1 tablet (20 mEq total) by mouth daily. 07/30/14   Thompson Grayer, MD  PRADAXA 150 MG CAPS capsule TAKE 1 CAPSULE BY MOUTH TWICE DAILY 07/23/14   Thompson Grayer, MD  predniSONE (DELTASONE) 10 MG tablet Take 3 tablets (30 mg total) by mouth daily. 08/31/14   Gregor Hams, MD  topiramate (TOPAMAX) 50 MG tablet Take 1 tablet (50 mg total) by mouth 2 (two) times daily. 07/24/14   Philmore Pali, NP   BP 95/59 mmHg  Pulse 106  Temp(Src) 99 F (37.2 C)  Resp 20  SpO2 89% Physical Exam  Constitutional: He is oriented to person, place, and  time. He appears well-developed and well-nourished. No distress.  HENT:  Head: Normocephalic and atraumatic.  Eyes: Conjunctivae are normal. Right eye exhibits no discharge. Left eye exhibits no discharge.  Neck: Neck supple.  Cardiovascular: Normal rate, regular rhythm and normal heart sounds.  Exam reveals no gallop and no friction rub.   No murmur heard. Pulmonary/Chest: Effort normal and breath sounds normal. No respiratory distress.  Abdominal: Soft. He exhibits no distension. There is no tenderness.  Musculoskeletal: He exhibits no edema or tenderness.  Neurological: He is alert and oriented to person, place, and time. No cranial nerve deficit. He exhibits normal muscle tone. Coordination normal.  CN intact. No facial droop appreciated. Expressive aphasia. Some responses to questions have to be repeated but answers questions appropriately.   Skin: Skin is warm and dry.  Psychiatric: He has a normal mood and affect. His behavior is normal. Thought content normal.  Nursing note and vitals reviewed.   ED Course  Procedures (including critical care time) Labs Review Labs Reviewed - No data to display  Imaging Review No results found.   EKG Interpretation   Date/Time:  Wednesday October 10 2014 14:49:51 EST Ventricular Rate:  101 PR Interval:    QRS Duration: 90 QT Interval:  356 QTC Calculation: 461 R Axis:   57 Text Interpretation:  Atrial fibrillation with rapid ventricular response  Nonspecific ST and T wave abnormality , probably digitalis effect Abnormal  ECG ED PHYSICIAN INTERPRETATION AVAILABLE IN [facility] Confirmed by  TEST, Record ([id number]) on 10/12/2014 8:35:34 AM      MDM   Final diagnoses:  Facial weakness  Weakness  CAP (community acquired pneumonia)    81yM with jaw drooping this morning? Since resolved. Baseline currently. No complaints. CXR with possible pneumonia. With generalized weakness this morning, will tx. Mild hypoxemia. Discussed  admission with pt/family. He would like to be discharged. He has medical decision making capability.     Virgel Manifold, MD 10/15/14 [phone]

## 2014-10-10 NOTE — ED Notes (Signed)
Pt transported to CT ?

## 2014-10-10 NOTE — ED Notes (Addendum)
Family member reports pt woke up this am with facial drooping to both sides of face. Hx of aphasia/dementia. bp 95/59 at triage, spo2 89% at triage. Family reports recent changes in medication dosage.

## 2014-10-23 ENCOUNTER — Inpatient Hospital Stay (HOSPITAL_COMMUNITY)
Admission: EM | Admit: 2014-10-23 | Discharge: 2014-10-26 | DRG: 871 | Disposition: A | Discharge status: Home Health | Payer: Medicare Other | Attending: Internal Medicine | Admitting: Internal Medicine

## 2014-10-23 ENCOUNTER — Emergency Department (HOSPITAL_COMMUNITY): Payer: Medicare Other

## 2014-10-23 ENCOUNTER — Encounter (HOSPITAL_COMMUNITY): Payer: Self-pay

## 2014-10-23 DIAGNOSIS — R0902 Hypoxemia: Secondary | ICD-10-CM | POA: Diagnosis not present

## 2014-10-23 DIAGNOSIS — Z951 Presence of aortocoronary bypass graft: Secondary | ICD-10-CM

## 2014-10-23 DIAGNOSIS — I69391 Dysphagia following cerebral infarction: Secondary | ICD-10-CM

## 2014-10-23 DIAGNOSIS — I481 Persistent atrial fibrillation: Secondary | ICD-10-CM | POA: Diagnosis not present

## 2014-10-23 DIAGNOSIS — I69359 Hemiplegia and hemiparesis following cerebral infarction affecting unspecified side: Secondary | ICD-10-CM | POA: Diagnosis not present

## 2014-10-23 DIAGNOSIS — K219 Gastro-esophageal reflux disease without esophagitis: Secondary | ICD-10-CM | POA: Diagnosis present

## 2014-10-23 DIAGNOSIS — F329 Major depressive disorder, single episode, unspecified: Secondary | ICD-10-CM | POA: Diagnosis present

## 2014-10-23 DIAGNOSIS — R509 Fever, unspecified: Secondary | ICD-10-CM

## 2014-10-23 DIAGNOSIS — G4733 Obstructive sleep apnea (adult) (pediatric): Secondary | ICD-10-CM | POA: Diagnosis present

## 2014-10-23 DIAGNOSIS — A419 Sepsis, unspecified organism: Principal | ICD-10-CM | POA: Diagnosis present

## 2014-10-23 DIAGNOSIS — I739 Peripheral vascular disease, unspecified: Secondary | ICD-10-CM | POA: Diagnosis present

## 2014-10-23 DIAGNOSIS — R918 Other nonspecific abnormal finding of lung field: Secondary | ICD-10-CM | POA: Diagnosis not present

## 2014-10-23 DIAGNOSIS — G3184 Mild cognitive impairment, so stated: Secondary | ICD-10-CM | POA: Diagnosis present

## 2014-10-23 DIAGNOSIS — I4891 Unspecified atrial fibrillation: Secondary | ICD-10-CM | POA: Diagnosis not present

## 2014-10-23 DIAGNOSIS — M479 Spondylosis, unspecified: Secondary | ICD-10-CM | POA: Diagnosis present

## 2014-10-23 DIAGNOSIS — Z87891 Personal history of nicotine dependence: Secondary | ICD-10-CM

## 2014-10-23 DIAGNOSIS — I2510 Atherosclerotic heart disease of native coronary artery without angina pectoris: Secondary | ICD-10-CM | POA: Diagnosis not present

## 2014-10-23 DIAGNOSIS — Z7901 Long term (current) use of anticoagulants: Secondary | ICD-10-CM

## 2014-10-23 DIAGNOSIS — J189 Pneumonia, unspecified organism: Secondary | ICD-10-CM | POA: Diagnosis not present

## 2014-10-23 DIAGNOSIS — Z950 Presence of cardiac pacemaker: Secondary | ICD-10-CM

## 2014-10-23 DIAGNOSIS — E785 Hyperlipidemia, unspecified: Secondary | ICD-10-CM | POA: Diagnosis present

## 2014-10-23 DIAGNOSIS — Z8546 Personal history of malignant neoplasm of prostate: Secondary | ICD-10-CM

## 2014-10-23 DIAGNOSIS — Z923 Personal history of irradiation: Secondary | ICD-10-CM

## 2014-10-23 DIAGNOSIS — I10 Essential (primary) hypertension: Secondary | ICD-10-CM | POA: Diagnosis present

## 2014-10-23 DIAGNOSIS — G40909 Epilepsy, unspecified, not intractable, without status epilepticus: Secondary | ICD-10-CM | POA: Diagnosis present

## 2014-10-23 DIAGNOSIS — E876 Hypokalemia: Secondary | ICD-10-CM | POA: Diagnosis present

## 2014-10-23 LAB — COMPREHENSIVE METABOLIC PANEL
ALT: 11 U/L (ref 0–53)
AST: 13 U/L (ref 0–37)
Albumin: 3.3 g/dL — ABNORMAL LOW (ref 3.5–5.2)
Alkaline Phosphatase: 75 U/L (ref 39–117)
Anion gap: 7 (ref 5–15)
BUN: 18 mg/dL (ref 6–23)
CO2: 25 mmol/L (ref 19–32)
Calcium: 8.7 mg/dL (ref 8.4–10.5)
Chloride: 109 mEq/L (ref 96–112)
Creatinine, Ser: 1.25 mg/dL (ref 0.50–1.35)
GFR calc Af Amer: 61 mL/min — ABNORMAL LOW (ref >90)
GFR calc non Af Amer: 53 mL/min — ABNORMAL LOW (ref >90)
Glucose, Bld: 127 mg/dL — ABNORMAL HIGH (ref 70–99)
Potassium: 3.4 mmol/L — ABNORMAL LOW (ref 3.5–5.1)
SODIUM: 141 mmol/L (ref 135–145)
Total Bilirubin: 0.8 mg/dL (ref 0.3–1.2)
Total Protein: 6.4 g/dL (ref 6.0–8.3)

## 2014-10-23 LAB — CBC WITH DIFFERENTIAL/PLATELET
Basophils Absolute: 0 10*3/uL (ref 0.0–0.1)
Basophils Relative: 0 % (ref 0–1)
EOS ABS: 0 10*3/uL (ref 0.0–0.7)
Eosinophils Relative: 0 % (ref 0–5)
HCT: 37.5 % — ABNORMAL LOW (ref 39.0–52.0)
HEMOGLOBIN: 11.8 g/dL — AB (ref 13.0–17.0)
Lymphocytes Relative: 3 % — ABNORMAL LOW (ref 12–46)
Lymphs Abs: 0.5 10*3/uL — ABNORMAL LOW (ref 0.7–4.0)
MCH: 28.9 pg (ref 26.0–34.0)
MCHC: 31.5 g/dL (ref 30.0–36.0)
MCV: 91.7 fL (ref 78.0–100.0)
MONO ABS: 1.1 10*3/uL — AB (ref 0.1–1.0)
MONOS PCT: 5 % (ref 3–12)
NEUTROS PCT: 92 % — AB (ref 43–77)
Neutro Abs: 19.3 10*3/uL — ABNORMAL HIGH (ref 1.7–7.7)
Platelets: 245 10*3/uL (ref 150–400)
RBC: 4.09 MIL/uL — ABNORMAL LOW (ref 4.22–5.81)
RDW: 14.7 % (ref 11.5–15.5)
WBC: 20.9 10*3/uL — ABNORMAL HIGH (ref 4.0–10.5)

## 2014-10-23 LAB — I-STAT CG4 LACTIC ACID, ED: Lactic Acid, Venous: 0.99 mmol/L (ref 0.5–2.2)

## 2014-10-23 MED ORDER — ACETAMINOPHEN 325 MG PO TABS
650.0000 mg | ORAL_TABLET | Freq: Once | ORAL | Status: AC
  Administered 2014-10-23: 650 mg via ORAL
  Filled 2014-10-23: qty 2

## 2014-10-23 MED ORDER — SODIUM CHLORIDE 0.9 % IV SOLN
INTRAVENOUS | Status: AC
  Administered 2014-10-23: 1000 mL via INTRAVENOUS

## 2014-10-23 MED ORDER — VANCOMYCIN HCL IN DEXTROSE 1-5 GM/200ML-% IV SOLN
1000.0000 mg | Freq: Once | INTRAVENOUS | Status: AC
  Administered 2014-10-23: 1000 mg via INTRAVENOUS
  Filled 2014-10-23: qty 200

## 2014-10-23 MED ORDER — SODIUM CHLORIDE 0.9 % IV BOLUS (SEPSIS)
1000.0000 mL | Freq: Once | INTRAVENOUS | Status: AC
  Administered 2014-10-23: 1000 mL via INTRAVENOUS

## 2014-10-23 MED ORDER — PIPERACILLIN-TAZOBACTAM 3.375 G IVPB
3.3750 g | Freq: Once | INTRAVENOUS | Status: AC
  Administered 2014-10-23: 3.375 g via INTRAVENOUS
  Filled 2014-10-23: qty 50

## 2014-10-23 NOTE — ED Notes (Signed)
Patient began fever tonight with shaking. Per spouse patients fever was 102 at home.

## 2014-10-23 NOTE — ED Provider Notes (Addendum)
TIME SEEN: 10:32 PM  CHIEF COMPLAINT: Fever  HPI: Pt is a 81 y.o. M with h/o of dementia, PVD, prostate Ca, CVA, CAD, AFib on Pradaxa, HTN, HLD, OSA, and DDD who presents to the ED fever of 102 with onset at 8:30 PM. Wife notes cough at baseline as he has a history of aspiration. Pt's wife notes he was recently diagnosed with pneumonia in [facility] ED on 10/10/14, and completed his round of Abx on 10/17/14. Patient and wife report he was feeling better but then began having fever, chills tonight. Pt denies oxygen use at home. Pt is no longer a smoker. Pt denies Hx of COPD and asthma. Pt denies headache, neck pain or neck stiffness, chest pain, SOB, vomiting, diarrhea, abdominal pain, dysuria or hematuria, rash. No sick contacts or recent travel. PCP [employer].    ROS: See HPI Constitutional:  fever  Eyes: no drainage  ENT: no runny nose   Cardiovascular:  no chest pain  Resp: no SOB  GI: no vomiting GU: no dysuria Integumentary: no rash  Allergy: no hives  Musculoskeletal: no leg swelling  Neurological: no slurred speech ROS otherwise negative  PAST MEDICAL HISTORY/PAST SURGICAL HISTORY:  Past Medical History  Diagnosis Date  . CVA (cerebral infarction) 1999  . DDD (degenerative disc disease), cervical   . Memory impairment   . Persistent atrial fibrillation     on pradaxa  . Coronary artery disease     s/p 5v CABG 2002  . Depression   . GERD (gastroesophageal reflux disease)   . Hyperlipidemia   . Hypertension   . PVD (peripheral vascular disease)   . 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 OSA (obstructive sleep apnea)     Recently diagnosed though pt. has not had CPAP trial  . Seizures   . Pacemaker   . Prostate cancer 2006    s/p XRT    MEDICATIONS:  Prior to Admission medications   Medication Sig Start Date End Date Taking? Authorizing Provider  atorvastatin (LIPITOR) 40 MG tablet TAKE 1 TABLET BY MOUTH ONCE DAILY 08/22/14   Candee Furbish, MD   cephALEXin (KEFLEX) 500 MG capsule Take 1 capsule (500 mg total) by mouth 3 (three) times daily. 08/31/14   Gregor Hams, MD  diltiazem (CARDIZEM CD) 120 MG 24 hr capsule Take 120 mg by mouth 2 (two) times daily. 07/09/14   Historical Provider, MD  diltiazem (CARDIZEM CD) 180 MG 24 hr capsule Take 2 by mouth once daily 08/02/14   Thompson Grayer, MD  FLUoxetine (PROZAC) 40 MG capsule Take 40 mg by mouth daily.      Historical Provider, MD  furosemide (LASIX) 40 MG tablet Take 0.5 tablets (20 mg total) by mouth daily. 07/30/14   Thompson Grayer, MD  levofloxacin (LEVAQUIN) 500 MG tablet Take 1 tablet (500 mg total) by mouth daily. 10/10/14   Virgel Manifold, MD  Lutein 20 MG CAPS Take 1 capsule by mouth daily.     Historical Provider, MD  Multiple Vitamins-Minerals (MULTIVITAMIN WITH MINERALS) tablet Take 1 tablet by mouth daily. Chewable flinstones    Historical Provider, MD  omeprazole (PRILOSEC OTC) 20 MG tablet Take 20 mg by mouth 2 (two) times daily.      Historical Provider, MD  potassium chloride SA (K-DUR,KLOR-CON) 20 MEQ tablet Take 1 tablet (20 mEq total) by mouth daily. 07/30/14   Thompson Grayer, MD  PRADAXA 150 MG CAPS capsule TAKE 1 CAPSULE BY MOUTH TWICE DAILY  07/23/14   Thompson Grayer, MD  predniSONE (DELTASONE) 10 MG tablet Take 3 tablets (30 mg total) by mouth daily. 08/31/14   Gregor Hams, MD  topiramate (TOPAMAX) 50 MG tablet Take 1 tablet (50 mg total) by mouth 2 (two) times daily. 07/24/14   Philmore Pali, NP    ALLERGIES:  Allergies  Allergen Reactions  . Ramipril Other (See Comments)     cough    SOCIAL HISTORY:  History  Substance Use Topics  . Smoking status: Former Smoker -- 2.00 packs/day for 48 years    Types: Cigarettes    Quit date: 03/09/1998  . Smokeless tobacco: Never Used  . Alcohol Use: No     Comment: quit: 1999    FAMILY HISTORY: Family History  Problem Relation Age of Onset  . Ovarian cancer    . Pancreatic cancer    . Lung cancer    . Cancer Mother      ovarian  . Ovarian cancer Mother   . Cancer Father     pancreatic  . Pancreatic cancer Father   . Cancer Brother     lung    EXAM: BP 119/73 mmHg  Pulse 126  Temp(Src) 100.7 F (38.2 C) (Rectal)  Resp 18  Ht 5\' 5"  (1.651 m)  Wt 187 lb (84.823 kg)  BMI 31.12 kg/m2  SpO2 99% CONSTITUTIONAL: Alert and oriented and responds appropriately to questions. Well-appearing; well-nourished, elderly, in no distress HEAD: Normocephalic EYES: Conjunctivae clear, PERRL ENT: normal nose; no rhinorrhea; moist mucous membranes; pharynx without lesions noted NECK: Supple, no meningismus, no LAD  CARD: tachycardic, irregularly irregular, no murmurs or gallops or rubs RESP: Rhonchi left middle and upper lobe, hypoxic, and mildly tachypneic, no respiratory distress or increased work of breathing, no wheezing or rales  ABD/GI: Normal bowel sounds; non-distended; soft, non-tender, no rebound, no guarding BACK:  The back appears normal and is non-tender to palpation, there is no CVA tenderness EXT: Normal ROM in all joints; non-tender to palpation; no edema; normal capillary refill; no cyanosis, no calf tenderness    SKIN: Normal color for age and race; warm, no rash NEURO: Moves all extremities equally PSYCH: The patient's mood and manner are appropriate. Grooming and personal hygiene are appropriate.  MEDICAL DECISION MAKING: Patient here who meets SIRS criteria. He is tachycardic, febrile. We'll start broad-spectrum antibiotics, IV fluids. Will obtain labs included cultures, chest x-ray, urine, lactate. He has new onset oxygen requirement. Suspect pneumonia.   ED PROGRESS: Patient's chest x-ray shows a left lower lobe pneumonia with pleural effusion. Urine is still pending. Patient does have a leukocytosis of 20 with left shift. He continues to be tachycardic but this is improving with IV fluids. I do not feel he needs diltiazem at this time as a suspect his A. fib with RVR is related to sepsis. We'll  discuss with hospitalist for admission.   12:00 AM  D/w Dr. Ernestina Patches for admission.  He will see the patient in place orders.     EKG Interpretation  Date/Time:  Tuesday October 23 2014 23:56:06 EST Ventricular Rate:  116 PR Interval:    QRS Duration: 92 QT Interval:  367 QTC Calculation: 510 R Axis:   47 Text Interpretation:  Age not entered, assumed to be  81 years old for purpose of ECG interpretation Atrial fibrillation Borderline low voltage, extremity leads Anteroseptal infarct, old Nonspecific repol abnormality, diffuse leads Prolonged QT interval Confirmed by Auden Wettstein,  DO, Corianne Buccellato ([id number]) on 10/24/2014 12:03:05 AM         .  I personally performed the services described in this documentation, which was scribed in my presence. The recorded information has been reviewed and is accurate.    [place], DO 10/24/14 [place], DO 10/24/14 0003

## 2014-10-24 DIAGNOSIS — I69391 Dysphagia following cerebral infarction: Secondary | ICD-10-CM | POA: Diagnosis not present

## 2014-10-24 DIAGNOSIS — I481 Persistent atrial fibrillation: Secondary | ICD-10-CM | POA: Diagnosis present

## 2014-10-24 DIAGNOSIS — J189 Pneumonia, unspecified organism: Secondary | ICD-10-CM

## 2014-10-24 DIAGNOSIS — Z950 Presence of cardiac pacemaker: Secondary | ICD-10-CM

## 2014-10-24 DIAGNOSIS — G4733 Obstructive sleep apnea (adult) (pediatric): Secondary | ICD-10-CM | POA: Diagnosis present

## 2014-10-24 DIAGNOSIS — I69359 Hemiplegia and hemiparesis following cerebral infarction affecting unspecified side: Secondary | ICD-10-CM | POA: Diagnosis not present

## 2014-10-24 DIAGNOSIS — G3184 Mild cognitive impairment, so stated: Secondary | ICD-10-CM | POA: Diagnosis present

## 2014-10-24 DIAGNOSIS — I4891 Unspecified atrial fibrillation: Secondary | ICD-10-CM

## 2014-10-24 DIAGNOSIS — Z7901 Long term (current) use of anticoagulants: Secondary | ICD-10-CM | POA: Diagnosis not present

## 2014-10-24 DIAGNOSIS — E876 Hypokalemia: Secondary | ICD-10-CM | POA: Diagnosis present

## 2014-10-24 DIAGNOSIS — I10 Essential (primary) hypertension: Secondary | ICD-10-CM | POA: Diagnosis present

## 2014-10-24 DIAGNOSIS — Z87891 Personal history of nicotine dependence: Secondary | ICD-10-CM | POA: Diagnosis not present

## 2014-10-24 DIAGNOSIS — K219 Gastro-esophageal reflux disease without esophagitis: Secondary | ICD-10-CM | POA: Diagnosis present

## 2014-10-24 DIAGNOSIS — Z951 Presence of aortocoronary bypass graft: Secondary | ICD-10-CM | POA: Diagnosis not present

## 2014-10-24 DIAGNOSIS — I739 Peripheral vascular disease, unspecified: Secondary | ICD-10-CM | POA: Diagnosis present

## 2014-10-24 DIAGNOSIS — R0902 Hypoxemia: Secondary | ICD-10-CM | POA: Diagnosis not present

## 2014-10-24 DIAGNOSIS — A419 Sepsis, unspecified organism: Principal | ICD-10-CM

## 2014-10-24 DIAGNOSIS — E785 Hyperlipidemia, unspecified: Secondary | ICD-10-CM | POA: Diagnosis present

## 2014-10-24 DIAGNOSIS — M479 Spondylosis, unspecified: Secondary | ICD-10-CM | POA: Diagnosis present

## 2014-10-24 DIAGNOSIS — Z923 Personal history of irradiation: Secondary | ICD-10-CM | POA: Diagnosis not present

## 2014-10-24 DIAGNOSIS — G40909 Epilepsy, unspecified, not intractable, without status epilepticus: Secondary | ICD-10-CM | POA: Diagnosis present

## 2014-10-24 DIAGNOSIS — Z8546 Personal history of malignant neoplasm of prostate: Secondary | ICD-10-CM | POA: Diagnosis not present

## 2014-10-24 DIAGNOSIS — I2510 Atherosclerotic heart disease of native coronary artery without angina pectoris: Secondary | ICD-10-CM | POA: Diagnosis present

## 2014-10-24 DIAGNOSIS — F329 Major depressive disorder, single episode, unspecified: Secondary | ICD-10-CM | POA: Diagnosis present

## 2014-10-24 LAB — URINE MICROSCOPIC-ADD ON

## 2014-10-24 LAB — URINALYSIS, ROUTINE W REFLEX MICROSCOPIC
Bilirubin Urine: NEGATIVE
GLUCOSE, UA: NEGATIVE mg/dL
Ketones, ur: 15 mg/dL — AB
Leukocytes, UA: NEGATIVE
Nitrite: NEGATIVE
Specific Gravity, Urine: 1.015 (ref 1.005–1.030)
UROBILINOGEN UA: 0.2 mg/dL (ref 0.0–1.0)
pH: 7 (ref 5.0–8.0)

## 2014-10-24 LAB — INFLUENZA PANEL BY PCR (TYPE A & B)
H1N1 flu by pcr: NOT DETECTED
INFLAPCR: NEGATIVE
Influenza B By PCR: NEGATIVE

## 2014-10-24 LAB — STREP PNEUMONIAE URINARY ANTIGEN: Strep Pneumo Urinary Antigen: NEGATIVE

## 2014-10-24 LAB — MAGNESIUM: MAGNESIUM: 1.9 mg/dL (ref 1.5–2.5)

## 2014-10-24 LAB — MRSA PCR SCREENING: MRSA by PCR: NEGATIVE

## 2014-10-24 MED ORDER — POTASSIUM CHLORIDE IN NACL 20-0.9 MEQ/L-% IV SOLN
INTRAVENOUS | Status: DC
  Administered 2014-10-24 – 2014-10-26 (×3): via INTRAVENOUS

## 2014-10-24 MED ORDER — OMEPRAZOLE 20 MG PO CPDR
20.0000 mg | DELAYED_RELEASE_CAPSULE | Freq: Two times a day (BID) | ORAL | Status: DC
  Filled 2014-10-24 (×4): qty 1

## 2014-10-24 MED ORDER — FLUOXETINE HCL 20 MG PO CAPS
40.0000 mg | ORAL_CAPSULE | Freq: Every day | ORAL | Status: DC
Start: 2014-10-24 — End: 2014-10-26
  Administered 2014-10-24 – 2014-10-26 (×3): 40 mg via ORAL
  Filled 2014-10-24 (×3): qty 2

## 2014-10-24 MED ORDER — PIPERACILLIN-TAZOBACTAM 3.375 G IVPB: 3.3750 g | Freq: Three times a day (TID) | INTRAVENOUS | Status: DC

## 2014-10-24 MED ORDER — PANTOPRAZOLE SODIUM 40 MG PO TBEC
40.0000 mg | DELAYED_RELEASE_TABLET | Freq: Two times a day (BID) | ORAL | Status: DC
  Administered 2014-10-24 – 2014-10-26 (×6): 40 mg via ORAL
  Filled 2014-10-24 (×6): qty 1

## 2014-10-24 MED ORDER — TOPIRAMATE 25 MG PO TABS
50.0000 mg | ORAL_TABLET | Freq: Two times a day (BID) | ORAL | Status: DC
  Administered 2014-10-24 – 2014-10-26 (×6): 50 mg via ORAL
  Filled 2014-10-24 (×13): qty 2

## 2014-10-24 MED ORDER — PIPERACILLIN-TAZOBACTAM 3.375 G IVPB
3.3750 g | Freq: Three times a day (TID) | INTRAVENOUS | Status: DC
  Administered 2014-10-24 – 2014-10-25 (×4): 3.375 g via INTRAVENOUS
  Filled 2014-10-24 (×8): qty 50

## 2014-10-24 MED ORDER — DEXTROSE 5 % IV SOLN
5.0000 mg/h | INTRAVENOUS | Status: DC
  Administered 2014-10-24: 5 mg/h via INTRAVENOUS
  Filled 2014-10-24: qty 100

## 2014-10-24 MED ORDER — DILTIAZEM HCL ER COATED BEADS 180 MG PO CP24
180.0000 mg | ORAL_CAPSULE | Freq: Every day | ORAL | Status: DC
  Administered 2014-10-24 – 2014-10-26 (×3): 180 mg via ORAL
  Filled 2014-10-24 (×3): qty 1

## 2014-10-24 MED ORDER — ATORVASTATIN CALCIUM 40 MG PO TABS
40.0000 mg | ORAL_TABLET | Freq: Every day | ORAL | Status: DC
  Administered 2014-10-24 – 2014-10-26 (×3): 40 mg via ORAL
  Filled 2014-10-24 (×5): qty 1

## 2014-10-24 MED ORDER — DABIGATRAN ETEXILATE MESYLATE 150 MG PO CAPS
150.0000 mg | ORAL_CAPSULE | Freq: Two times a day (BID) | ORAL | Status: DC
  Administered 2014-10-24 – 2014-10-26 (×6): 150 mg via ORAL
  Filled 2014-10-24 (×13): qty 1

## 2014-10-24 MED ORDER — VANCOMYCIN HCL IN DEXTROSE 750-5 MG/150ML-% IV SOLN
750.0000 mg | Freq: Two times a day (BID) | INTRAVENOUS | Status: DC
  Administered 2014-10-24 – 2014-10-25 (×3): 750 mg via INTRAVENOUS
  Filled 2014-10-24 (×5): qty 150

## 2014-10-24 MED ORDER — TOPIRAMATE 25 MG PO TABS
ORAL_TABLET | ORAL | Status: AC
Start: 2014-10-24 — End: 2014-10-24
  Filled 2014-10-24: qty 2

## 2014-10-24 MED ORDER — DABIGATRAN ETEXILATE MESYLATE 150 MG PO CAPS
ORAL_CAPSULE | ORAL | Status: AC
  Filled 2014-10-24: qty 1

## 2014-10-24 MED ORDER — PIPERACILLIN-TAZOBACTAM 3.375 G IVPB
INTRAVENOUS | Status: AC
  Filled 2014-10-24: qty 50

## 2014-10-24 NOTE — Care Management Utilization Note (Signed)
UR completed 

## 2014-10-24 NOTE — Progress Notes (Addendum)
ANTIBIOTIC CONSULT NOTE-Preliminary  Pharmacy Consult for zosyn, vancomycin Indication: pneumonia  Allergies  Allergen Reactions  . Ramipril Other (See Comments)     cough    Patient Measurements: Height: 5\' 5"  (165.1 cm) Weight: 187 lb (84.823 kg) IBW/kg (Calculated) : 61.5   Vital Signs: Temp: 100.7 F (38.2 C) (01/12 2255) Temp Source: Rectal (01/12 2255) BP: 119/73 mmHg (01/12 2329) Pulse Rate: 126 (01/12 2329)  Labs:  Recent Labs  10/23/14 2256  WBC 20.9*  HGB 11.8*  PLT 245  CREATININE 1.25    Estimated Creatinine Clearance: 48 mL/min (by C-G formula based on Cr of 1.25).  No results for input(s): VANCOTROUGH, VANCOPEAK, VANCORANDOM, GENTTROUGH, GENTPEAK, GENTRANDOM, TOBRATROUGH, TOBRAPEAK, TOBRARND, AMIKACINPEAK, AMIKACINTROU, AMIKACIN in the last 72 hours.   Microbiology: Recent Results (from the past 720 hour(s))  Blood culture (routine x 2)     Status: None (Preliminary result)   Collection Time: 10/23/14 10:56 PM  Result Value Ref Range Status   Specimen Description BLOOD LEFT HAND  Final   Special Requests BOTTLES DRAWN AEROBIC AND ANAEROBIC 8CC EACH  Final   Culture PENDING  Incomplete   Report Status PENDING  Incomplete  Blood culture (routine x 2)     Status: None (Preliminary result)   Collection Time: 10/23/14 10:56 PM  Result Value Ref Range Status   Specimen Description RIGHT ANTECUBITAL  Final   Special Requests BOTTLES DRAWN AEROBIC AND ANAEROBIC [facility] EACH  Final   Culture PENDING  Incomplete   Report Status PENDING  Incomplete    Medical History: Past Medical History  Diagnosis Date  . CVA (cerebral infarction) 1999  . DDD (degenerative disc disease), cervical   . Memory impairment   . Persistent atrial fibrillation     on pradaxa  . Coronary artery disease     s/p 5v CABG 2002  . Depression   . GERD (gastroesophageal reflux disease)   . Hyperlipidemia   . Hypertension   . PVD (peripheral vascular disease)   .  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 OSA (obstructive sleep apnea)     Recently diagnosed though pt. has not had CPAP trial  . Seizures   . Pacemaker   . Prostate cancer 2006    s/p XRT    Medications:  Scheduled:  . atorvastatin  40 mg Oral Daily  . dabigatran  150 mg Oral BID  . FLUoxetine  40 mg Oral Daily  . omeprazole  20 mg Oral BID  . topiramate  50 mg Oral BID    Assessment: 81 yo male presents with fever and cough and history or aspiration. CXR shows left lower lobe Pneumonia with pleural effusion. Will treat for aspiration pneumonia.   Goal of Therapy:  Vancomycin trough level 15-20 mcg/ml  Plan:  Preliminary review of pertinent patient information completed.  Protocol will be initiated with zosyn 3.375 grams Q8 hours to start 8 hours after dose in ED given at 2304 and patient has already received vancomycin 1000 mg in ED and will not need another dose until AM on 1/13.  Forestine Na clinical pharmacist will complete review during morning rounds to assess patient and finalize treatment regimen.  Landa Mullinax Scarlett, RPH 10/24/2014,12:16 AM

## 2014-10-24 NOTE — Care Management Note (Addendum)
    Page 1 of 2   10/26/2014     2:56:56 PM CARE MANAGEMENT NOTE 10/26/2014  Patient:  Zachary Warren, Zachary Warren   Account Number:  [id number]  Date Initiated:  10/24/2014  Documentation initiated by:  Jolene Provost  Subjective/Objective Assessment:   Pt is admitted for sepsis. Pt's wife in room during assessment. Pt is from home, independent with ADL's, lives with wife. Pt has no HH services, DME's or medication needs prior to admission.     Action/Plan:   Pt plans to discharge home with self care. No CM needs identified at this time. Will cont. to follow.   Anticipated DC Date:  10/27/2014   Anticipated DC Plan:  [place]  CM consult      [facility] Choice  HOME HEALTH   Choice offered to / List presented to:  C-3 Spouse        HH arranged  HH-1 RN  [facility].   Status of service:  Completed, signed off Medicare Important Message given?  YES (If response is "NO", the following Medicare IM given date fields will be blank) Date Medicare IM given:  10/26/2014 Medicare IM given by:  Theophilus Kinds Date Additional Medicare IM given:   Additional Medicare IM given by:    Discharge Disposition:  [place]  Per UR Regulation:    If discussed at Long Length of Stay Meetings, dates discussed:    Comments:  10/26/14 [place], RN BSN CM Pt to be discharged home today with [facility] RN and ST (per pts wife choice). Romualdo Bolk of [facility] is aware and will collect the pts information from the chart. [place] services to start within 48 hours of discharge. No DME needs noted. Pt and pts nurse aware of discharge arrangements.  10/24/2014 [place], RN, MSN, [facility]

## 2014-10-24 NOTE — H&P (Addendum)
Hospitalist Admission History and Physical  Patient name: Zachary Warren Medical record number: [id number] Date of birth: August 24, 1933 Age: 81 y.o. Gender: male  Primary Care Provider: Wenda Low, MD  Chief Complaint: sepsis, CAP, afib w/ RVR  History of Present Illness:This is a 81 y.o. year old male with significant past medical history of CVA s/ residual dysphagia hemiparesis, CAD s/p CABG, afib/arrhythmia s/p pacemaker presenting with CAP, afib w/ RVR, sepsis. Pt noted to have been seen on 12/30 for cough. Noted history of aspiration. Was dxd w/ PNA. Pt declined admission at that time and was placed on outpt regimen for PNA. Per wife, pt completed course. States that pt has had worsening cough and weakness over past 3-4 days. Pt had increased lethargy today. Wife took temp and it was around 101. Pt denies any SOB, cough, CP, fever.  Presents to [facility] T 100.7, HR 120s-130s-afib w/ RVR, resp 10s, BP 110s, Satting 89% on RA, 99% on 2L [place]. WBC 21, hgb 11.8, Cr 1.25, K 3.4. CXR shows persistent PNA. EKG shows Afib w/ RVR. UA pending. Lactate WNL.  Assessment and Plan: Zachary Warren is a 81 y.o. year old male presenting with sepsis, CAP, afib w/ RVR  Active Problems:   Sepsis   CAP (community acquired pneumonia)   Atrial fibrillation with RVR   1- Sepsis -likely secondary to pulmonary source -failed outpt treatment for PNA  -increased aspiration risk  -Vanc and zosyn -panculture -urine strep and legionella  2- Afib -Dilt gtt -cont pradaxa   3- CAD s/p CABG -no active CP  -cont home regimen  -follow   4- Hx/o seizure d/o  -stable  -cont topamax   FEN/GI: NPO for now. Bed side swallow eval. May need dysphagia diet given aspiration risk   Prophylaxis: pradaxa  Disposition: pending further evaluation  Code Status:Full Code    Patient Active Problem List   Diagnosis Date Noted  . Sepsis 10/24/2014  . MCI (mild cognitive impairment) with memory loss 07/24/2014  .  Obesity 12/25/2013  . Cardiac pacemaker in situ 12/25/2013  . Aneurysm 06/28/2012  . SIRS (systemic inflammatory response syndrome) 02/05/2012  . Encephalopathy acute 02/05/2012  . CVA (cerebral infarction) 02/05/2012  . Seizure 02/05/2012  . Dysphagia as late effect of stroke 02/05/2012  . GERD (gastroesophageal reflux disease) 02/05/2012  . 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 HYPERLIPIDEMIA-MIXED 11/21/2010  . Essential hypertension 11/21/2010  . CAD, NATIVE VESSEL 11/21/2010  . Atrial fibrillation 11/21/2010  . PVD 11/21/2010   Past Medical History: Past Medical History  Diagnosis Date  . CVA (cerebral infarction) 1999  . DDD (degenerative disc disease), cervical   . Memory impairment   . Persistent atrial fibrillation     on pradaxa  . Coronary artery disease     s/p 5v CABG 2002  . Depression   . GERD (gastroesophageal reflux disease)   . Hyperlipidemia   . Hypertension   . PVD (peripheral vascular disease)   . 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 OSA (obstructive sleep apnea)     Recently diagnosed though pt. has not had CPAP trial  . Seizures   . Pacemaker   . Prostate cancer 2006    s/p XRT    Past Surgical History: Past Surgical History  Procedure Laterality Date  . Coronary artery bypass graft  2002  . Cea  1999  . Pacemaker insertion      implanted by JA 6/12  . Insert / replace /  remove pacemaker    . Cardioversion N/A 08/21/2013    Procedure: CARDIOVERSION;  Surgeon: Candee Furbish, MD;  Location: [facility] ENDOSCOPY;  Service: Cardiovascular;  Laterality: N/A;    Social History: History   Social History  . Marital Status: Married    Spouse Name: [name]    Number of Children: 3  . Years of Education: N/A   Occupational History  . [employer]   Social History Main Topics  . Smoking status: Former Smoker -- 2.00 packs/day for 48 years    Types: Cigarettes    Quit date: 03/09/1998  . Smokeless tobacco: Never Used   . Alcohol Use: No     Comment: quit: 1999  . Drug Use: No  . Sexual Activity: No   Other Topics Concern  . None   Social History Narrative   Pt lives in [place] with spouse.  Retired.  Prior Biochemist, clinical for [employer].   Caffeine Use: once daily   Married 20 years    Family History: Family History  Problem Relation Age of Onset  . Ovarian cancer    . Pancreatic cancer    . Lung cancer    . Cancer Mother     ovarian  . Ovarian cancer Mother   . Cancer Father     pancreatic  . Pancreatic cancer Father   . Cancer Brother     lung    Allergies: Allergies  Allergen Reactions  . Ramipril Other (See Comments)     cough    Current Facility-Administered Medications  Medication Dose Route Frequency Provider Last Rate Last Dose  . 0.9 %  sodium chloride infusion   Intravenous Continuous Kristen N Ward, DO 100 mL/hr at 10/23/14 2302 1,000 mL at 10/23/14 2302  . 0.9 % NaCl with KCl 20 mEq/ L  infusion   Intravenous Continuous Shanda Howells, MD      . atorvastatin (LIPITOR) tablet 40 mg  40 mg Oral Daily Shanda Howells, MD      . dabigatran (PRADAXA) capsule 150 mg  150 mg Oral BID Shanda Howells, MD      . diltiazem (CARDIZEM) 100 mg in dextrose 5 % 100 mL (1 mg/mL) infusion  5-15 mg/hr Intravenous Titrated Shanda Howells, MD      . FLUoxetine (PROZAC) capsule 40 mg  40 mg Oral Daily Shanda Howells, MD      . omeprazole (PRILOSEC OTC) EC tablet 20 mg  20 mg Oral BID Shanda Howells, MD      . topiramate (TOPAMAX) tablet 50 mg  50 mg Oral BID Shanda Howells, MD      . vancomycin (VANCOCIN) IVPB 1000 mg/200 mL premix  1,000 mg Intravenous Once Kristen N Ward, DO 200 mL/hr at 10/23/14 2343 1,000 mg at 10/23/14 2343   Current Outpatient Prescriptions  Medication Sig Dispense Refill  . atorvastatin (LIPITOR) 40 MG tablet TAKE 1 TABLET BY MOUTH ONCE DAILY 30 tablet 5  . diltiazem (CARDIZEM CD) 180 MG 24 hr capsule Take 2 by mouth once daily (Patient taking differently: Take  180 mg by mouth 2 (two) times daily. ) 180 capsule 3  . FLUoxetine (PROZAC) 40 MG capsule Take 40 mg by mouth daily.      . furosemide (LASIX) 40 MG tablet Take 0.5 tablets (20 mg total) by mouth daily. 30 tablet   . Lutein 20 MG CAPS Take 1 capsule by mouth daily.     . Multiple Vitamins-Minerals (MULTIVITAMIN WITH MINERALS)  tablet Take 1 tablet by mouth daily. Chewable flinstones    . omeprazole (PRILOSEC OTC) 20 MG tablet Take 20 mg by mouth 2 (two) times daily.      . potassium chloride SA (K-DUR,KLOR-CON) 20 MEQ tablet Take 1 tablet (20 mEq total) by mouth daily. 60 tablet 6  . PRADAXA 150 MG CAPS capsule TAKE 1 CAPSULE BY MOUTH TWICE DAILY 60 capsule 11  . topiramate (TOPAMAX) 50 MG tablet Take 1 tablet (50 mg total) by mouth 2 (two) times daily. 180 tablet 4  . cephALEXin (KEFLEX) 500 MG capsule Take 1 capsule (500 mg total) by mouth 3 (three) times daily. (Patient not taking: Reported on 10/23/2014) 21 capsule 0  . levofloxacin (LEVAQUIN) 500 MG tablet Take 1 tablet (500 mg total) by mouth daily. (Patient not taking: Reported on 10/23/2014) 7 tablet 0  . predniSONE (DELTASONE) 10 MG tablet Take 3 tablets (30 mg total) by mouth daily. (Patient not taking: Reported on 10/23/2014) 15 tablet 0   Review Of Systems: 12 point ROS negative except as noted above in HPI.  Physical Exam: Filed Vitals:   10/23/14 2329  BP: 119/73  Pulse: 126  Temp:   Resp: 18    General: alert, cooperative and mild dysarthria HEENT: PERRLA and extra ocular movement intact Heart: S1, S2 normal, no murmur, rub or gallop, regular rate and rhythm Lungs: clear to auscultation, no wheezes or rales and unlabored breathing Abdomen: abdomen is soft without significant tenderness, masses, organomegaly or guarding Extremities: mild generalized weakness Skin:no rashes Neurology: generalized weakness, otherwise grossly normal   Labs and Imaging: Lab Results  Component Value Date/Time   NA 141 10/23/2014 10:56 PM   K  3.4* 10/23/2014 10:56 PM   CL 109 10/23/2014 10:56 PM   CO2 25 10/23/2014 10:56 PM   BUN 18 10/23/2014 10:56 PM   CREATININE 1.25 10/23/2014 10:56 PM   GLUCOSE 127* 10/23/2014 10:56 PM   Lab Results  Component Value Date   WBC 20.9* 10/23/2014   HGB 11.8* 10/23/2014   HCT 37.5* 10/23/2014   MCV 91.7 10/23/2014   PLT 245 10/23/2014   Urinalysis    Dg Chest 2 View  10/23/2014   CLINICAL DATA:  Acute onset of fever and cough.  Initial encounter.  EXAM: CHEST  2 VIEW  COMPARISON:  Chest radiograph performed 10/10/2014  FINDINGS: The lungs are well-aerated. Mild bibasilar opacities may reflect persistent pneumonia or mild pulmonary edema, perhaps slightly improved from the recent prior study. No pleural effusion or pneumothorax is seen.  The heart is borderline normal in size. The patient is status post median sternotomy, with evidence of prior CABG. A pacemaker is noted at the left chest wall, with leads ending at the right atrium and right ventricle. No acute osseous abnormalities are seen.  IMPRESSION: Mild bibasilar airspace opacities may reflect persistent pneumonia or mild pulmonary edema, perhaps slightly improved from the prior study.   Electronically Signed   By: Garald Balding M.D.   On: 10/23/2014 23:59           Shanda Howells MD  Pager: [phone]

## 2014-10-24 NOTE — Evaluation (Signed)
Clinical/Bedside Swallow Evaluation Patient Details  Name: Zachary Warren MRN: [id number] DOB: 1933-03-07  Today's Date: 10/24/2014 Time:2:17 PM  - 2:42 PM    Past Medical History:  Past Medical History  Diagnosis Date  . CVA (cerebral infarction) 1999  . DDD (degenerative disc disease), cervical   . Memory impairment   . Persistent atrial fibrillation     on pradaxa  . Coronary artery disease     s/p 5v CABG 2002  . Depression   . GERD (gastroesophageal reflux disease)   . Hyperlipidemia   . Hypertension   . PVD (peripheral vascular disease)   . 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 OSA (obstructive sleep apnea)     Recently diagnosed though pt. has not had CPAP trial  . Seizures   . Pacemaker   . Prostate cancer 2006    s/p XRT   Past Surgical History:  Past Surgical History  Procedure Laterality Date  . Coronary artery bypass graft  2002  . Cea  1999  . Pacemaker insertion      implanted by JA 6/12  . Insert / replace / remove pacemaker    . Cardioversion N/A 08/21/2013    Procedure: CARDIOVERSION;  Surgeon: Candee Furbish, MD;  Location: [facility] ENDOSCOPY;  Service: Cardiovascular;  Laterality: N/A;   HPI:  Zachary Warren is a 81 yo male who presented to the ED with sepsis, CAP, afib with RVR. He has a past medical history significant for CVA s/ residual dysphagia and hemiparesis, CAD s/p CABG, afib/arrhythmia s/p pacemaker. Pt noted to have been seen on 09/3014 for cough. Noted history of aspiration. Was dxd w/ PNA. Pt declined admission at that time and was placed on outpt regimen for PNA. Per wife, pt completed course. States that pt has had worsening cough and weakness over past 3-4 days. Pt had increased lethargy today. Wife took temp and it was around 101. Pt denies any SOB, cough, CP, fever.  [name] is known to this SLP from outpatient therapy in November 2015 following MBSS completed at [facility] in October 2015. SLP asked to complete clinical swallow  evaluation due to history of dysphagia, 4th episode of PNA in the last 12 months, and RN report of pt getting choked when taking medication with water. Previous recommendations were made for regular textures with nectar-thick liquids, however pt and wife continued with thin liquids. Pt with documented silent aspiration on MBSS in October. He was advised to take small sips with chin tuck if he chose to continue with thin liquids.   Assessment/Recommendations/Treatment Plan    SLP Assessment  Clinical Impression Statement: Mr. Picou has known history of silent aspiration with thin liquids. MBSS was completed in October 2015 with recommendation for regular textures and thin liquids (silent aspiration with thins), however pt and wife opted to continue with thin liquids to increase hydration. He was advised to take small sips and implement chin to chest posture with sips of thin liquid during his outpatient visit in November. Pt c/o congestion during his outpatient visit and again today. It appears that it has never really cleared. During my examination today, [name] was unable to tell me any of his aspiration precautions (small sips with chin tuck), so it is unlikely that he has followed through with this at home. His wife is currently not present.   Given current PNA, history of dysphagia, and cognitive impairment, recommend nectar-thick liquids, regular textures, and pills given whole  in puree. SLP will follow while in acute setting, however this will likely be recommendations going forward (new baseline diet). A repeat MBSS could be completed, but I do not think it would add much to current assessment.  Risk for Aspiration: Severe Other Related Risk Factors: History of pneumonia, History of dysphagia, Cognitive impairment  Swallow Evaluation Recommendations Diet Recommendations: Regular, Nectar-thick liquid Liquid Administration via: Cup Medication Administration: Whole meds with  puree Supervision: Intermittent supervision to cue for compensatory strategies, Staff to assist with self feeding Compensations: Slow rate, Small sips/bites Postural Changes and/or Swallow Maneuvers: Seated upright 90 degrees, Upright 30-60 min after meal Oral Care Recommendations: Oral care BID Other Recommendations: Order thickener from pharmacy, Clarify dietary restrictions Follow up Recommendations: None  Treatment Plan Treatment Plan Recommendations: Therapy as outlined in treatment plan below Speech Therapy Frequency (ACUTE ONLY): min 2x/week Treatment Duration: 1 week Interventions: Aspiration precaution training, Compensatory techniques, Patient/family education  Prognosis Prognosis for Safe Diet Advancement: Guarded Barriers to Reach Goals: Cognitive deficits  Individuals Consulted Consulted and Agree with Results and Recommendations: Patient, Patient unable/family or caregiver not available, RN  Swallowing Goals    Pt will demonstrate safe and efficient consumption of least restrictive diet with use of strategies as needed.   Swallow Study Prior Functional Status   Lives at home with his wife; history of cognitive communication deficits s/p CVA and dementia.  General  Date of Onset: 10/23/14 HPI: Zachary Warren is a 81 yo male who presented to the ED with sepsis, CAP, afib with RVR. He has a past medical history significant for CVA s/ residual dysphagia and hemiparesis, CAD s/p CABG, afib/arrhythmia s/p pacemaker. Pt noted to have been seen on 09/3014 for cough. Noted history of aspiration. Was dxd w/ PNA. Pt declined admission at that time and was placed on outpt regimen for PNA. Per wife, pt completed course. States that pt has had worsening cough and weakness over past 3-4 days. Pt had increased lethargy today. Wife took temp and it was around 101. Pt denies any SOB, cough, CP, fever.  Zachary Warren is known to this SLP from outpatient therapy in November 2015 following  MBSS completed at [facility] in October 2015. SLP asked to complete clinical swallow evaluation due to history of dysphagia, 4th episode of PNA in the last 12 months, and RN report of pt getting choked when taking medication with water. Previous recommendations were made for regular textures with nectar-thick liquids, however pt and wife continued with thin liquids. Pt with documented silent aspiration on MBSS in October. He was advised to take small sips with chin tuck if he chose to continue with thin liquids. Type of Study: Bedside swallow evaluation Previous Swallow Assessment: MBSS 07/2014 with recommendation for regular/thin Diet Prior to this Study: Regular, Thin liquids Temperature Spikes Noted: Yes Respiratory Status: Nasal cannula History of Recent Intubation: No Behavior/Cognition: Alert, Cooperative, Pleasant mood, Confused, Requires cueing Oral Cavity - Dentition: Adequate natural dentition Self-Feeding Abilities: Able to feed self Patient Positioning: Upright in bed Baseline Vocal Quality: Clear, Wet Volitional Cough: Weak, Congested Volitional Swallow: Able to elicit  Oral Motor/Sensory Function  Overall Oral Motor/Sensory Function: Appears within functional limits for tasks assessed Labial ROM: Within Functional Limits Labial Symmetry: Within Functional Limits Labial Strength: Within Functional Limits Labial Sensation: Within Functional Limits Lingual ROM: Within Functional Limits Lingual Symmetry: Within Functional Limits Lingual Strength: Reduced Lingual Sensation: Within Functional Limits Facial ROM: Within Functional Limits Facial Symmetry: Within Functional Limits Facial Strength: Within Functional  Limits Facial Sensation: Within Functional Limits Velum: Within Functional Limits Mandible: Within Functional Limits  Consistency Results  Ice Chips Ice chips: Within functional limits Presentation: Spoon  Thin Liquid Thin Liquid: Within functional limits Presentation:  Cup Other Comments:  (pt with history of silent aspiration)  Nectar Thick Liquid Nectar Thick Liquid: Within functional limits Presentation: Spoon  Honey Thick Liquid Honey Thick Liquid: Not tested  Puree Puree: Within functional limits Presentation: Spoon;Self Fed  Solid Solid: Within functional limits Presentation: Self Fed  Thank you,  Genene Churn, [place]  Ramsie Ostrander 10/24/2014,3:03 PM

## 2014-10-24 NOTE — Progress Notes (Signed)
ANTIBIOTIC CONSULT NOTE - INITIAL  Pharmacy Consult for Vancomycin and Zosyn Indication: pneumonia  Allergies  Allergen Reactions  . Ramipril Other (See Comments)     cough   Patient Measurements: Height: 5\' 9"  (175.3 cm) Weight: 187 lb (84.823 kg) IBW/kg (Calculated) : 70.7  Vital Signs: Temp: 98 F (36.7 C) (01/13 0400) Temp Source: Oral (01/13 0400) BP: 108/57 mmHg (01/13 0600) Pulse Rate: 98 (01/13 0600) Intake/Output from previous day: 01/12 0701 - 01/13 0700 In: 1400 [I.V.:1400] Out: -  Intake/Output from this shift:    Labs:  Recent Labs  10/23/14 2256  WBC 20.9*  HGB 11.8*  PLT 245  CREATININE 1.25   Estimated Creatinine Clearance: 47.9 mL/min (by C-G formula based on Cr of 1.25). No results for input(s): VANCOTROUGH, VANCOPEAK, VANCORANDOM, GENTTROUGH, GENTPEAK, GENTRANDOM, TOBRATROUGH, TOBRAPEAK, TOBRARND, AMIKACINPEAK, AMIKACINTROU, AMIKACIN in the last 72 hours.   Microbiology: Recent Results (from the past 720 hour(s))  Blood culture (routine x 2)     Status: None (Preliminary result)   Collection Time: 10/23/14 10:56 PM  Result Value Ref Range Status   Specimen Description BLOOD LEFT HAND  Final   Special Requests BOTTLES DRAWN AEROBIC AND ANAEROBIC 8CC EACH  Final   Culture PENDING  Incomplete   Report Status PENDING  Incomplete  Blood culture (routine x 2)     Status: None (Preliminary result)   Collection Time: 10/23/14 10:56 PM  Result Value Ref Range Status   Specimen Description RIGHT ANTECUBITAL  Final   Special Requests BOTTLES DRAWN AEROBIC AND ANAEROBIC 8CC EACH  Final   Culture PENDING  Incomplete   Report Status PENDING  Incomplete  MRSA PCR Screening     Status: None   Collection Time: 10/24/14 12:40 AM  Result Value Ref Range Status   MRSA by PCR NEGATIVE NEGATIVE Final    Comment:        The GeneXpert MRSA Assay (FDA approved for NASAL specimens only), is one component of a comprehensive MRSA colonization surveillance  program. It is not intended to diagnose MRSA infection nor to guide or monitor treatment for MRSA infections.    Medical History: Past Medical History  Diagnosis Date  . CVA (cerebral infarction) 1999  . DDD (degenerative disc disease), cervical   . Memory impairment   . Persistent atrial fibrillation     on pradaxa  . Coronary artery disease     s/p 5v CABG 2002  . Depression   . GERD (gastroesophageal reflux disease)   . Hyperlipidemia   . Hypertension   . PVD (peripheral vascular disease)   . 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 OSA (obstructive sleep apnea)     Recently diagnosed though pt. has not had CPAP trial  . Seizures   . Pacemaker   . Prostate cancer 2006    s/p XRT   Anti-infectives    Start     Dose/Rate Route Frequency Ordered Stop   10/24/14 1000  vancomycin (VANCOCIN) IVPB 750 mg/150 ml premix     750 mg150 mL/hr over 60 Minutes Intravenous Every 12 hours 10/24/14 0744     10/24/14 0700  piperacillin-tazobactam (ZOSYN) IVPB 3.375 g     3.375 g12.5 mL/hr over 240 Minutes Intravenous Every 8 hours 10/24/14 0021     10/24/14 0030  piperacillin-tazobactam (ZOSYN) IVPB 3.375 g  Status:  Discontinued     3.375 g12.5 mL/hr over 240 Minutes Intravenous Every 8 hours 10/24/14 0015 10/24/14 0021  10/23/14 2245  vancomycin (VANCOCIN) IVPB 1000 mg/200 mL premix     1,000 mg200 mL/hr over 60 Minutes Intravenous  Once 10/23/14 2240 10/24/14 0043   10/23/14 2245  piperacillin-tazobactam (ZOSYN) IVPB 3.375 g     3.375 g12.5 mL/hr over 240 Minutes Intravenous  Once 10/23/14 2240 10/23/14 2340     Assessment: 81yo male presents with fever and cough.  CXR shows LLL pneumonia.  Pt with h/o aspiration.  Asked to initiate Vancomycin and Zosyn.  Pt received initial doses on admission.    Goal of Therapy:  Vancomycin trough level 15-20 mcg/ml Eradicate infection.  Plan:  Zosyn 3.375gm IV q8h, each dose over 4 hrs Vancomycin 750mg  IV q12hrs Check trough at  steady state Monitor labs, renal fxn, and cultures  Hart Robinsons A 10/24/2014,7:45 AM

## 2014-10-24 NOTE — Progress Notes (Signed)
Report called to Ophelia Shoulder, RN. Patient traveling via wheelchair to 323 on unit [address]. Patient will be on tele and on 2L [place]. Patient was on Cardizem drip in unit but was transitioned to PO Cardizem today and his heart rate has been 90-105.

## 2014-10-24 NOTE — Progress Notes (Signed)
[facility] PROGRESS NOTE  Zachary Warren XBM:[id number] DOB: January 18, 1933 DOA: 10/23/2014 PCP: Zachary Low, MD  Assessment/Plan: Sepsis -Likely 2/2 to CAP. -Please see below for details.  CAP -Failed OP management with with levaquin. -Agree with vanc/zosyn. -Strep pneumo/legionella urine antigens. -Check influenza PCR. -Fourth episode of PNA in 12 months. Has h/o dysphagia and a prior CVA. Will get an ST eval.  A FIb with RVR -Wean off cardizem drip today. -Start PO cardizem. -Continue pradaxa.  H/o Dysphagia -RN relates concern with giving PO meds. -Remain NPO until evaluated by ST today.  H/o Seizures -No active seizure activity. -Continue topamax.  CAD -Stable, no CP. -Continue home regimen.   Code Status: Full Code Family Communication: Discussed with wife Zachary Warren at bedside  Disposition Plan: Keep in ICU today   Consultants:  None   Antibiotics:  Vanc  Zosyn   Subjective: Forgetful, no active complaints  Objective: Filed Vitals:   10/24/14 0700 10/24/14 0800 10/24/14 0836 10/24/14 0900  BP: 96/52 128/81  122/77  Pulse: 99 94  86  Temp:   97.5 F (36.4 C) 97.5 F (36.4 C)  TempSrc:   Oral   Resp: 20 17  18   Height:      Weight:      SpO2: 98% 98%  98%    Intake/Output Summary (Last 24 hours) at 10/24/14 0951 Last data filed at 10/24/14 0902  Gross per 24 hour  Intake   1400 ml  Output      0 ml  Net   1400 ml   Filed Weights   10/23/14 2235  Weight: 84.823 kg (187 lb)    Exam:   General:  Awake, NAD  Cardiovascular: Tachy, irregular  Respiratory: CTA B  Abdomen: S/NT/ND/+BS  Extremities: 1+edema bilaterally   Neurologic:  Non-focal  Data Reviewed: Basic Metabolic Panel:  Recent Labs Lab 10/23/14 2256 10/23/14 2353  NA 141  --   K 3.4*  --   CL 109  --   CO2 25  --   GLUCOSE 127*  --   BUN 18  --   CREATININE 1.25  --   CALCIUM 8.7  --   MG  --  1.9   Liver Function Tests:  Recent Labs Lab  10/23/14 2256  AST 13  ALT 11  ALKPHOS 75  BILITOT 0.8  PROT 6.4  ALBUMIN 3.3*   No results for input(s): LIPASE, AMYLASE in the last 168 hours. No results for input(s): AMMONIA in the last 168 hours. CBC:  Recent Labs Lab 10/23/14 2256  WBC 20.9*  NEUTROABS 19.3*  HGB 11.8*  HCT 37.5*  MCV 91.7  PLT 245   Cardiac Enzymes: No results for input(s): CKTOTAL, CKMB, CKMBINDEX, TROPONINI in the last 168 hours. BNP (last 3 results) No results for input(s): PROBNP in the last 8760 hours. CBG: No results for input(s): GLUCAP in the last 168 hours.  Recent Results (from the past 240 hour(s))  Blood culture (routine x 2)     Status: None (Preliminary result)   Collection Time: 10/23/14 10:56 PM  Result Value Ref Range Status   Specimen Description BLOOD LEFT HAND  Final   Special Requests BOTTLES DRAWN AEROBIC AND ANAEROBIC 8CC EACH  Final   Culture PENDING  Incomplete   Report Status PENDING  Incomplete  Blood culture (routine x 2)     Status: None (Preliminary result)   Collection Time: 10/23/14 10:56 PM  Result Value Ref Range Status   Specimen Description  RIGHT ANTECUBITAL  Final   Special Requests BOTTLES DRAWN AEROBIC AND ANAEROBIC 8CC EACH  Final   Culture PENDING  Incomplete   Report Status PENDING  Incomplete  MRSA PCR Screening     Status: None   Collection Time: 10/24/14 12:40 AM  Result Value Ref Range Status   MRSA by PCR NEGATIVE NEGATIVE Final    Comment:        The GeneXpert MRSA Assay (FDA approved for NASAL specimens only), is one component of a comprehensive MRSA colonization surveillance program. It is not intended to diagnose MRSA infection nor to guide or monitor treatment for MRSA infections.      Studies: Dg Chest 2 View  10/23/2014   CLINICAL DATA:  Acute onset of fever and cough.  Initial encounter.  EXAM: CHEST  2 VIEW  COMPARISON:  Chest radiograph performed 10/10/2014  FINDINGS: The lungs are well-aerated. Mild bibasilar opacities may  reflect persistent pneumonia or mild pulmonary edema, perhaps slightly improved from the recent prior study. No pleural effusion or pneumothorax is seen.  The heart is borderline normal in size. The patient is status post median sternotomy, with evidence of prior CABG. A pacemaker is noted at the left chest wall, with leads ending at the right atrium and right ventricle. No acute osseous abnormalities are seen.  IMPRESSION: Mild bibasilar airspace opacities may reflect persistent pneumonia or mild pulmonary edema, perhaps slightly improved from the prior study.   Electronically Signed   By: Garald Balding M.D.   On: 10/23/2014 23:59    Scheduled Meds: . atorvastatin  40 mg Oral Daily  . dabigatran  150 mg Oral BID  . FLUoxetine  40 mg Oral Daily  . pantoprazole  40 mg Oral BID AC  . piperacillin-tazobactam (ZOSYN)  IV  3.375 g Intravenous Q8H  . topiramate  50 mg Oral BID  . vancomycin  750 mg Intravenous Q12H   Continuous Infusions: . 0.9 % NaCl with KCl 20 mEq / L 75 mL/hr at 10/24/14 0100  . diltiazem (CARDIZEM) infusion 5 mg/hr (10/24/14 0115)    Active Problems:   Hyperlipidemia   Essential hypertension   CAD, NATIVE VESSEL   Dysphagia as late effect of stroke   Cardiac pacemaker in situ   MCI (mild cognitive impairment) with memory loss   Sepsis   CAP (community acquired pneumonia)   Atrial fibrillation with RVR    Time spent: 45 minutes. Greater than 50% of this time was spent in direct contact with the patient coordinating care.    Lelon Frohlich  [facility] Pager [phone]  If 7PM-7AM, please contact night-coverage at [email or web address], password [facility] 10/24/2014, 9:51 AM  LOS: 1 day

## 2014-10-25 DIAGNOSIS — E876 Hypokalemia: Secondary | ICD-10-CM

## 2014-10-25 LAB — URINE CULTURE
Colony Count: NO GROWTH
Culture: NO GROWTH

## 2014-10-25 LAB — BASIC METABOLIC PANEL
ANION GAP: 7 (ref 5–15)
BUN: 12 mg/dL (ref 6–23)
CALCIUM: 8.3 mg/dL — AB (ref 8.4–10.5)
CO2: 24 mmol/L (ref 19–32)
Chloride: 111 mEq/L (ref 96–112)
Creatinine, Ser: 0.93 mg/dL (ref 0.50–1.35)
GFR calc Af Amer: >90 mL/min (ref >90)
GFR, EST NON AFRICAN AMERICAN: 78 mL/min — AB (ref >90)
Glucose, Bld: 106 mg/dL — ABNORMAL HIGH (ref 70–99)
Potassium: 3 mmol/L — ABNORMAL LOW (ref 3.5–5.1)
Sodium: 142 mmol/L (ref 135–145)

## 2014-10-25 LAB — LEGIONELLA ANTIGEN, URINE

## 2014-10-25 LAB — CBC
HCT: 36.5 % — ABNORMAL LOW (ref 39.0–52.0)
HEMOGLOBIN: 11.3 g/dL — AB (ref 13.0–17.0)
MCH: 28.8 pg (ref 26.0–34.0)
MCHC: 31 g/dL (ref 30.0–36.0)
MCV: 93.1 fL (ref 78.0–100.0)
PLATELETS: 234 10*3/uL (ref 150–400)
RBC: 3.92 MIL/uL — AB (ref 4.22–5.81)
RDW: 14.8 % (ref 11.5–15.5)
WBC: 12.3 10*3/uL — AB (ref 4.0–10.5)

## 2014-10-25 LAB — HIV ANTIBODY (ROUTINE TESTING W REFLEX): HIV-1/HIV-2 Ab: NONREACTIVE

## 2014-10-25 MED ORDER — POTASSIUM CHLORIDE CRYS ER 20 MEQ PO TBCR
40.0000 meq | EXTENDED_RELEASE_TABLET | ORAL | Status: AC
  Administered 2014-10-25 (×3): 40 meq via ORAL
  Filled 2014-10-25 (×3): qty 2

## 2014-10-25 MED ORDER — AMOXICILLIN-POT CLAVULANATE 875-125 MG PO TABS
1.0000 | ORAL_TABLET | Freq: Two times a day (BID) | ORAL | Status: DC
  Administered 2014-10-25 – 2014-10-26 (×3): 1 via ORAL
  Filled 2014-10-25 (×3): qty 1

## 2014-10-25 MED ORDER — RESOURCE THICKENUP CLEAR PO POWD
ORAL | Status: DC | PRN
  Filled 2014-10-25: qty 125

## 2014-10-25 NOTE — Progress Notes (Addendum)
[facility] PROGRESS NOTE  Zachary Warren WJX:[id number] DOB: [birth year] DOA: 10/23/2014 PCP: Wenda Low, MD  Assessment/Plan: Sepsis -Likely 2/2 to CAP. -Please see below for details.  CAP -Failed OP management with with levaquin. -Strep pneumo/legionella urine antigens. -Influenza PCR negative. -Suspect aspiration playing a role in his repeated episodes of PNA. -Change antibiotics to Augmentin today.  A FIb with RVR -Rate improved. -Off cardizem drip. -Tolerating PO cardizem. -Continue pradaxa.  H/o Dysphagia -Seen by ST with recommendations for a regular diet with nectar-thick liquids.  H/o Seizures -No active seizure activity. -Continue topamax.  CAD -Stable, no CP. -Continue home regimen.  Hypokalemia -replete PO. -Mg ok at 1.9.   Code Status: Full Code Family Communication: Discussed with wife [name] at bedside  Disposition Plan: Consider DC home in am.   Consultants:  None   Antibiotics:  Augmentin  Subjective: Forgetful, no active complaints  Objective: Filed Vitals:   10/24/14 1639 10/24/14 2318 10/25/14 0528 10/25/14 1502  BP:  106/76 104/71 102/73  Pulse:  91 124 106  Temp: 97.5 F (36.4 C) 99.5 F (37.5 C) 99.5 F (37.5 C) 98 F (36.7 C)  TempSrc: Oral Oral Oral Oral  Resp:  17 20 18   Height:      Weight:      SpO2:  92% 94% 95%    Intake/Output Summary (Last 24 hours) at 10/25/14 1526 Last data filed at 10/25/14 0944  Gross per 24 hour  Intake    200 ml  Output    150 ml  Net     50 ml   Filed Weights   10/23/14 2235  Weight: 84.823 kg (187 lb)    Exam:   General:  Awake, NAD  Cardiovascular: irregular  Respiratory: CTA B  Abdomen: S/NT/ND/+BS  Extremities: 1+edema bilaterally   Neurologic:  Non-focal  Data Reviewed: Basic Metabolic Panel:  Recent Labs Lab 10/23/14 2256 10/23/14 2353 10/25/14 0542  NA 141  --  142  K 3.4*  --  3.0*  CL 109  --  111  CO2 25  --  24  GLUCOSE 127*   --  106*  BUN 18  --  12  CREATININE 1.25  --  0.93  CALCIUM 8.7  --  8.3*  MG  --  1.9  --    Liver Function Tests:  Recent Labs Lab 10/23/14 2256  AST 13  ALT 11  ALKPHOS 75  BILITOT 0.8  PROT 6.4  ALBUMIN 3.3*   No results for input(s): LIPASE, AMYLASE in the last 168 hours. No results for input(s): AMMONIA in the last 168 hours. CBC:  Recent Labs Lab 10/23/14 2256 10/25/14 0542  WBC 20.9* 12.3*  NEUTROABS 19.3*  --   HGB 11.8* 11.3*  HCT 37.5* 36.5*  MCV 91.7 93.1  PLT 245 234   Cardiac Enzymes: No results for input(s): CKTOTAL, CKMB, CKMBINDEX, TROPONINI in the last 168 hours. BNP (last 3 results) No results for input(s): PROBNP in the last 8760 hours. CBG: No results for input(s): GLUCAP in the last 168 hours.  Recent Results (from the past 240 hour(s))  Blood culture (routine x 2)     Status: None (Preliminary result)   Collection Time: 10/23/14 10:56 PM  Result Value Ref Range Status   Specimen Description BLOOD LEFT HAND  Final   Special Requests BOTTLES DRAWN AEROBIC AND ANAEROBIC 8CC EACH  Final   Culture NO GROWTH 2 DAYS  Final   Report Status PENDING  Incomplete  Blood culture (routine x 2)     Status: None (Preliminary result)   Collection Time: 10/23/14 10:56 PM  Result Value Ref Range Status   Specimen Description BLOOD RIGHT ANTECUBITAL  Final   Special Requests BOTTLES DRAWN AEROBIC AND ANAEROBIC 8CC EACH  Final   Culture NO GROWTH 2 DAYS  Final   Report Status PENDING  Incomplete  Urine culture     Status: None   Collection Time: 10/23/14 11:59 PM  Result Value Ref Range Status   Specimen Description URINE, CLEAN CATCH  Final   Special Requests NONE  Final   Colony Count NO GROWTH Performed at [facility]   Final   Culture NO GROWTH Performed at [facility]   Final   Report Status 10/25/2014 FINAL  Final  MRSA PCR Screening     Status: None   Collection Time: 10/24/14 12:40 AM  Result Value Ref Range Status    MRSA by PCR NEGATIVE NEGATIVE Final    Comment:        The GeneXpert MRSA Assay (FDA approved for NASAL specimens only), is one component of a comprehensive MRSA colonization surveillance program. It is not intended to diagnose MRSA infection nor to guide or monitor treatment for MRSA infections.      Studies: Dg Chest 2 View  10/23/2014   CLINICAL DATA:  Acute onset of fever and cough.  Initial encounter.  EXAM: CHEST  2 VIEW  COMPARISON:  Chest radiograph performed 10/10/2014  FINDINGS: The lungs are well-aerated. Mild bibasilar opacities may reflect persistent pneumonia or mild pulmonary edema, perhaps slightly improved from the recent prior study. No pleural effusion or pneumothorax is seen.  The heart is borderline normal in size. The patient is status post median sternotomy, with evidence of prior CABG. A pacemaker is noted at the left chest wall, with leads ending at the right atrium and right ventricle. No acute osseous abnormalities are seen.  IMPRESSION: Mild bibasilar airspace opacities may reflect persistent pneumonia or mild pulmonary edema, perhaps slightly improved from the prior study.   Electronically Signed   By: Garald Balding M.D.   On: 10/23/2014 23:59    Scheduled Meds: . atorvastatin  40 mg Oral Daily  . dabigatran  150 mg Oral BID  . diltiazem  180 mg Oral Daily  . FLUoxetine  40 mg Oral Daily  . pantoprazole  40 mg Oral BID AC  . piperacillin-tazobactam (ZOSYN)  IV  3.375 g Intravenous Q8H  . potassium chloride  40 mEq Oral Q4H  . topiramate  50 mg Oral BID  . vancomycin  750 mg Intravenous Q12H   Continuous Infusions: . 0.9 % NaCl with KCl 20 mEq / L 75 mL/hr at 10/24/14 1419  . diltiazem (CARDIZEM) infusion 5 mg/hr (10/24/14 0115)    Active Problems:   Hyperlipidemia   Essential hypertension   CAD, NATIVE VESSEL   Dysphagia as late effect of stroke   Cardiac pacemaker in situ   MCI (mild cognitive impairment) with memory loss   Sepsis   CAP  (community acquired pneumonia)   Atrial fibrillation with RVR    Time spent: 35 minutes. Greater than 50% of this time was spent in direct contact with the patient coordinating care.    Lelon Frohlich  [facility] Pager [phone]  If 7PM-7AM, please contact night-coverage at [email or web address], password [facility] 10/25/2014, 3:26 PM  LOS: 2 days

## 2014-10-25 NOTE — Progress Notes (Signed)
Speech Language Pathology Treatment: Dysphagia  Patient Details Name: Zachary Warren MRN: [id number] DOB: April 25, 1933 Today's Date: 10/25/2014 Time: 4098-1191 SLP Time Calculation (min) (ACUTE ONLY): 29 min  Assessment / Plan / Recommendation Clinical Impression  Mr. Corti was seen in split session today with wife present for education. He does not seem to mind the nectar-thick liquids and wife is in agreement to continue with NTL at home going forward. Pt actually demonstrated chin tuck independently with NTL today (no recall of this yesterday). Pt is at risk for aspiration with thin liquids, risks are minimized with use of chin tuck however implementation of this 100% of the time is not possible due to cognitive status (wife voices understanding of this). Recommend continued regular diet texture with nectar-thick liquids going forward. No further SLP services indicated at this time.    HPI HPI: Mr. Zachary Warren is a 81 yo male who presented to the ED with sepsis, CAP, afib with RVR. He has a past medical history significant for CVA s/ residual dysphagia and hemiparesis, CAD s/p CABG, afib/arrhythmia s/p pacemaker. Pt noted to have been seen on 09/3014 for cough. Noted history of aspiration. Was dxd w/ PNA. Pt declined admission at that time and was placed on outpt regimen for PNA. Per wife, pt completed course. States that pt has had worsening cough and weakness over past 3-4 days. Pt had increased lethargy today. Wife took temp and it was around 101. Pt denies any SOB, cough, CP, fever.  Mr. Zachary Warren is known to this SLP from outpatient therapy in November 2015 following MBSS completed at [facility] in October 2015. SLP asked to complete clinical swallow evaluation due to history of dysphagia, 4th episode of PNA in the last 12 months, and RN report of pt getting choked when taking medication with water. Previous recommendations were made for regular textures with nectar-thick liquids, however pt and wife  continued with thin liquids. Pt with documented silent aspiration on MBSS in October. He was advised to take small sips with chin tuck if he chose to continue with thin liquids.   Pertinent Vitals Pain Assessment: No/denies pain  SLP Plan  All goals met    Recommendations Diet recommendations: Regular;Nectar-thick liquid Liquids provided via: Cup;Straw Medication Administration: Whole meds with puree Supervision: Intermittent supervision to cue for compensatory strategies;Staff to assist with self feeding Compensations: Slow rate;Small sips/bites Postural Changes and/or Swallow Maneuvers: Seated upright 90 degrees;Upright 30-60 min after meal              Oral Care Recommendations: Oral care BID Follow up Recommendations: None Plan: All goals met   Thank you,  Genene Churn, [place]      [place] 10/25/2014, 1:27 PM

## 2014-10-26 MED ORDER — DILTIAZEM HCL ER COATED BEADS 180 MG PO CP24: 180.0000 mg | ORAL_CAPSULE | Freq: Every day | ORAL | Status: DC

## 2014-10-26 MED ORDER — DILTIAZEM HCL ER COATED BEADS 180 MG PO CP24
180.0000 mg | ORAL_CAPSULE | Freq: Every day | ORAL | Status: DC
Start: 2014-10-26 — End: 2014-10-26

## 2014-10-26 MED ORDER — AMOXICILLIN-POT CLAVULANATE 875-125 MG PO TABS
1.0000 | ORAL_TABLET | Freq: Two times a day (BID) | ORAL | Status: DC
Start: 2014-10-26 — End: 2014-11-19

## 2014-10-26 MED ORDER — AMOXICILLIN-POT CLAVULANATE 875-125 MG PO TABS: 1.0000 | ORAL_TABLET | Freq: Two times a day (BID) | ORAL | Status: DC

## 2014-10-26 NOTE — Progress Notes (Signed)
Pt discharged in the care of his wife. Discharge instructions and medications reviewed and all questions answered. No further questions at this time. IV catheter removed and IV intact. IV catheter site clean, dry, and intact. Pt escorted by Vista Deck, R.N.

## 2014-10-26 NOTE — Evaluation (Signed)
Physical Therapy Evaluation Patient Details Name: Zachary Warren MRN: [id number] DOB: 08/26/1933 Today's Date: 10/26/2014   History of Present Illness  History of Present Illness:This is a 81 y.o. year old male with significant past medical history of CVA s/ residual dysphagia hemiparesis, CAD s/p CABG, afib/arrhythmia s/p pacemaker presenting with CAP, afib w/ RVR, sepsis. Pt noted to have been seen on 12/30 for cough. Noted history of aspiration. Was dxd w/ PNA. Pt declined admission at that time and was placed on outpt regimen for PNA. Per wife, pt completed course. States that pt has had worsening cough and weakness over past 3-4 days. Pt had increased lethargy today. Wife took temp and it was around 101. Pt denies any SOB, cough, CP, fever.    Clinical Impression  Pt was seen for evaluation and found to be mildly conditioned with compromised dynamic and static standing balance.  He was alert and cooperative, able to follow all directions.  He had mild difficulty with word finding, a probable residual from previous stroke.  His gait is mildly unstable due to decreased balance.  He was instructed in the use of a cane and this did help to stabilize his gait.  He would benefit from HHPT at d/c and pt is agreeable to this.    Follow Up Recommendations Home health PT    Equipment Recommendations  Cane    Recommendations for Other Services   none    Precautions / Restrictions Precautions Precautions: Fall Restrictions Weight Bearing Restrictions: No      Mobility  Bed Mobility Overal bed mobility: Modified Independent                Transfers Overall transfer level: Modified independent Equipment used: None                Ambulation/Gait Ambulation/Gait assistance: Min guard Ambulation Distance (Feet): 200 Feet Assistive device: Straight cane Gait Pattern/deviations: WFL(Within Functional Limits)   Gait velocity interpretation: at or above normal speed for  age/gender General Gait Details: mild instability with gait which is improved with the use of a cane  Stairs Stairs: Yes Stairs assistance: Supervision Stair Management: One rail Right;Alternating pattern;Forwards;Backwards;With cane Number of Stairs: 3    Wheelchair Mobility    Modified Rankin (Stroke Patients Only)       Balance Overall balance assessment: Needs assistance Sitting-balance support: No upper extremity supported;Feet supported Sitting balance-Leahy Scale: Good     Standing balance support: No upper extremity supported;During functional activity Standing balance-Leahy Scale: Fair                               Pertinent Vitals/Pain Pain Assessment: No/denies pain    Home Living Family/patient expects to be discharged to:: Private residence Living Arrangements: Spouse/significant other Available Help at Discharge: Family   Home Access: Stairs to enter Entrance Stairs-Rails: Right;Left;Can reach both Technical brewer of Steps: 5 Home Layout: One level Home Equipment: Shower seat;Grab bars - tub/shower      Prior Function Level of Independence: Independent               Hand Dominance   Dominant Hand: Right    Extremity/Trunk Assessment               Lower Extremity Assessment: Overall WFL for tasks assessed (mild deconditioning)      Cervical / Trunk Assessment: Normal  Communication   Communication: Expressive difficulties (mild expressive aphasia)  Cognition  Arousal/Alertness: Awake/alert Behavior During Therapy: WFL for tasks assessed/performed Overall Cognitive Status: History of cognitive impairments - at baseline                      General Comments      Exercises        Assessment/Plan    PT Assessment Patient needs continued PT services  PT Diagnosis Abnormality of gait (deconditioning)   PT Problem List Decreased activity tolerance;Decreased balance;Decreased mobility;Decreased  knowledge of use of DME  PT Treatment Interventions Gait training;Balance training;Therapeutic exercise   PT Goals (Current goals can be found in the Care Plan section) Acute Rehab PT Goals Patient Stated Goal: none PT Goal Formulation: With patient/family Time For Goal Achievement: 11/09/14 Potential to Achieve Goals: Good    Frequency Min 3X/week   Barriers to discharge   none    Co-evaluation               End of Session Equipment Utilized During Treatment: Gait belt Activity Tolerance: Patient tolerated treatment well Patient left: in chair;with call bell/phone within reach;with chair alarm set;with family/visitor present Nurse Communication: Mobility status         Time: 1050-1131 PT Time Calculation (min) (ACUTE ONLY): 41 min   Charges:   PT Evaluation $Initial PT Evaluation Tier I: 1 Procedure PT Treatments $Gait Training: 8-22 mins   PT G CodesSable Feil 10/26/2014, 11:49 AM

## 2014-10-26 NOTE — Discharge Summary (Signed)
Physician Discharge Summary  Zachary Warren HYW:[id number] DOB: 1933-09-27 DOA: 10/23/2014  PCP: Wenda Low, MD  Admit date: 10/23/2014 Discharge date: 10/26/2014  Time spent: 45 minutes  Recommendations for Outpatient Follow-up:  -Will be discharged home today. -Advised to follow up with PCP in 2 weeks. -Would recommend a CXR in 4-6 weeks to evaluate resolution of PNA.   Discharge Diagnoses:  Active Problems:   Hyperlipidemia   Essential hypertension   CAD, NATIVE VESSEL   Dysphagia as late effect of stroke   Cardiac pacemaker in situ   MCI (mild cognitive impairment) with memory loss   Sepsis   CAP (community acquired pneumonia)   Atrial fibrillation with RVR   Hypokalemia   Discharge Condition: Stable and improved  Filed Weights   10/23/14 2235  Weight: 84.823 kg (187 lb)    History of present illness:  This is a 81 y.o. year old male with significant past medical history of CVA s/ residual dysphagia hemiparesis, CAD s/p CABG, afib/arrhythmia s/p pacemaker presenting with CAP, afib w/ RVR, sepsis. Pt noted to have been seen on 12/30 for cough. Noted history of aspiration. Was dxd w/ PNA. Pt declined admission at that time and was placed on outpt regimen for PNA. Per wife, pt completed course. States that pt has had worsening cough and weakness over past 3-4 days. Pt had increased lethargy today. Wife took temp and it was around 101. Pt denies any SOB, cough, CP, fever.  Presents to [facility] ER T 100.7, HR 120s-130s-afib w/ RVR, resp 10s, BP 110s, Satting 89% on RA, 99% on 2L [place]. WBC 21, hgb 11.8, Cr 1.25, K 3.4. CXR shows persistent PNA. EKG shows Afib w/ RVR. UA pending. Lactate WNL.  Hospital Course:   Sepsis -Likely 2/2 to CAP. -Sepsis parameters resolved. -Please see below for details.  CAP -Failed OP management with with levaquin. -Strep pneumo/legionella urine antigens. -Influenza PCR negative. -Suspect aspiration playing a role in his repeated episodes of  PNA. -Changed antibiotics to Augmentin; has 7 days remaining on DC.  A FIb with RVR -Rate improved. -Off cardizem drip. -Tolerating PO cardizem. -Continue pradaxa.  H/o Dysphagia -Seen by ST with recommendations for a regular diet with nectar-thick liquids. -To continue ST in the OP setting.  H/o Seizures -No active seizure activity. -Continue topamax.  CAD -Stable, no CP. -Continue home regimen.  Hypokalemia -repleted. -Mg ok at 1.9.  Procedures:  None   Consultations:  None  Discharge Instructions  Discharge Instructions    Diet - low sodium heart healthy    Complete by:  As directed      Increase activity slowly    Complete by:  As directed             Medication List    STOP taking these medications        cephALEXin 500 MG capsule  Commonly known as:  KEFLEX     furosemide 40 MG tablet  Commonly known as:  LASIX     levofloxacin 500 MG tablet  Commonly known as:  LEVAQUIN     potassium chloride SA 20 MEQ tablet  Commonly known as:  K-DUR,KLOR-CON     predniSONE 10 MG tablet  Commonly known as:  DELTASONE      TAKE these medications        amoxicillin-clavulanate 875-125 MG per tablet  Commonly known as:  AUGMENTIN  Take 1 tablet by mouth every 12 (twelve) hours.     atorvastatin 40 MG tablet  Commonly known as:  LIPITOR  TAKE 1 TABLET BY MOUTH ONCE DAILY     diltiazem 180 MG 24 hr capsule  Commonly known as:  CARDIZEM CD  Take 1 capsule (180 mg total) by mouth daily.     FLUoxetine 40 MG capsule  Commonly known as:  PROZAC  Take 40 mg by mouth daily.     Lutein 20 MG Caps  Take 1 capsule by mouth daily.     multivitamin with minerals tablet  Take 1 tablet by mouth daily. Chewable flinstones     omeprazole 20 MG tablet  Commonly known as:  PRILOSEC OTC  Take 20 mg by mouth 2 (two) times daily.     PRADAXA 150 MG Caps capsule  Generic drug:  dabigatran  TAKE 1 CAPSULE BY MOUTH TWICE DAILY     topiramate 50 MG tablet    Commonly known as:  TOPAMAX  Take 1 tablet (50 mg total) by mouth 2 (two) times daily.       Allergies  Allergen Reactions  . Ramipril Other (See Comments)     cough       Follow-up Information    Follow up with [facility].   Contact information:   [address] [place] [address] [phone]       Follow up with Wenda Low, MD. Schedule an appointment as soon as possible for a visit in 2 weeks.   Specialty:  Internal Medicine   Contact information:   [address] [place] [address] [phone]        The results of significant diagnostics from this hospitalization (including imaging, microbiology, ancillary and laboratory) are listed below for reference.    Significant Diagnostic Studies: Dg Chest 2 View  10/23/2014   CLINICAL DATA:  Acute onset of fever and cough.  Initial encounter.  EXAM: CHEST  2 VIEW  COMPARISON:  Chest radiograph performed 10/10/2014  FINDINGS: The lungs are well-aerated. Mild bibasilar opacities may reflect persistent pneumonia or mild pulmonary edema, perhaps slightly improved from the recent prior study. No pleural effusion or pneumothorax is seen.  The heart is borderline normal in size. The patient is status post median sternotomy, with evidence of prior CABG. A pacemaker is noted at the left chest wall, with leads ending at the right atrium and right ventricle. No acute osseous abnormalities are seen.  IMPRESSION: Mild bibasilar airspace opacities may reflect persistent pneumonia or mild pulmonary edema, perhaps slightly improved from the prior study.   Electronically Signed   By: Garald Balding M.D.   On: 10/23/2014 23:59   Dg Chest 2 View  10/10/2014   CLINICAL DATA:  Weakness.  EXAM: CHEST  2 VIEW  COMPARISON:  June 28, 2014.  FINDINGS: Stable cardiomediastinal silhouette. Status post coronary artery bypass graft. Left-sided pacemaker is unchanged in position. No pneumothorax or  pleural effusion is noted. Increased left lower lobe opacity is noted concerning for pneumonia. Bony thorax is intact.  IMPRESSION: Increased left lower lobe opacity is noted concerning for pneumonia. Followup radiographs are recommended until resolution.   Electronically Signed   By: Sabino Dick M.D.   On: 10/10/2014 17:20   Ct Head Wo Contrast  10/10/2014   CLINICAL DATA:  Bilateral facial numbness lasting approximately 1 hr. Symptoms have now resolved. History of CVA.  EXAM: CT HEAD WITHOUT CONTRAST  TECHNIQUE: Contiguous axial images were obtained from the base of the skull through the vertex without intravenous contrast.  COMPARISON:  04/16/2012  FINDINGS: Old left MCA infarct with encephalomalacia in the left frontal lobe, stable. Atrophy and chronic microvascular disease. Ex vacuo dilatation of the left lateral ventricle in the area of encephalomalacia. No acute intracranial abnormality. Specifically, no hemorrhage, hydrocephalus, mass lesion, acute infarction, or significant intracranial injury. No acute calvarial abnormality. Visualized paranasal sinuses and mastoids clear. Orbital soft tissues unremarkable.  IMPRESSION: No acute intracranial abnormality.   Electronically Signed   By: Rolm Baptise M.D.   On: 10/10/2014 16:10    Microbiology: Recent Results (from the past 240 hour(s))  Blood culture (routine x 2)     Status: None (Preliminary result)   Collection Time: 10/23/14 10:56 PM  Result Value Ref Range Status   Specimen Description BLOOD LEFT HAND  Final   Special Requests BOTTLES DRAWN AEROBIC AND ANAEROBIC 8CC EACH  Final   Culture NO GROWTH 2 DAYS  Final   Report Status PENDING  Incomplete  Blood culture (routine x 2)     Status: None (Preliminary result)   Collection Time: 10/23/14 10:56 PM  Result Value Ref Range Status   Specimen Description BLOOD RIGHT ANTECUBITAL  Final   Special Requests BOTTLES DRAWN AEROBIC AND ANAEROBIC 8CC EACH  Final   Culture NO GROWTH 2 DAYS   Final   Report Status PENDING  Incomplete  Urine culture     Status: None   Collection Time: 10/23/14 11:59 PM  Result Value Ref Range Status   Specimen Description URINE, CLEAN CATCH  Final   Special Requests NONE  Final   Colony Count NO GROWTH Performed at [facility]   Final   Culture NO GROWTH Performed at [facility]   Final   Report Status 10/25/2014 FINAL  Final  MRSA PCR Screening     Status: None   Collection Time: 10/24/14 12:40 AM  Result Value Ref Range Status   MRSA by PCR NEGATIVE NEGATIVE Final    Comment:        The GeneXpert MRSA Assay (FDA approved for NASAL specimens only), is one component of a comprehensive MRSA colonization surveillance program. It is not intended to diagnose MRSA infection nor to guide or monitor treatment for MRSA infections.      Labs: Basic Metabolic Panel:  Recent Labs Lab 10/23/14 2256 10/23/14 2353 10/25/14 0542  NA 141  --  142  K 3.4*  --  3.0*  CL 109  --  111  CO2 25  --  24  GLUCOSE 127*  --  106*  BUN 18  --  12  CREATININE 1.25  --  0.93  CALCIUM 8.7  --  8.3*  MG  --  1.9  --    Liver Function Tests:  Recent Labs Lab 10/23/14 2256  AST 13  ALT 11  ALKPHOS 75  BILITOT 0.8  PROT 6.4  ALBUMIN 3.3*   No results for input(s): LIPASE, AMYLASE in the last 168 hours. No results for input(s): AMMONIA in the last 168 hours. CBC:  Recent Labs Lab 10/23/14 2256 10/25/14 0542  WBC 20.9* 12.3*  NEUTROABS 19.3*  --   HGB 11.8* 11.3*  HCT 37.5* 36.5*  MCV 91.7 93.1  PLT 245 234   Cardiac Enzymes: No results for input(s): CKTOTAL, CKMB, CKMBINDEX, TROPONINI in the last 168 hours. BNP: BNP (last 3 results) No results for input(s): PROBNP in the last 8760 hours. CBG: No results for input(s): GLUCAP in the last 168 hours.     Signed:  HERNANDEZ  [facility]  [facility] Pager: [phone] 10/26/2014, 3:02 PM

## 2014-10-28 DIAGNOSIS — E785 Hyperlipidemia, unspecified: Secondary | ICD-10-CM | POA: Diagnosis not present

## 2014-10-28 DIAGNOSIS — E119 Type 2 diabetes mellitus without complications: Secondary | ICD-10-CM | POA: Diagnosis not present

## 2014-10-28 DIAGNOSIS — R1310 Dysphagia, unspecified: Secondary | ICD-10-CM | POA: Diagnosis not present

## 2014-10-28 DIAGNOSIS — F0390 Unspecified dementia without behavioral disturbance: Secondary | ICD-10-CM | POA: Diagnosis not present

## 2014-10-28 DIAGNOSIS — Z8701 Personal history of pneumonia (recurrent): Secondary | ICD-10-CM | POA: Diagnosis not present

## 2014-10-28 DIAGNOSIS — I2510 Atherosclerotic heart disease of native coronary artery without angina pectoris: Secondary | ICD-10-CM | POA: Diagnosis not present

## 2014-10-28 DIAGNOSIS — I4891 Unspecified atrial fibrillation: Secondary | ICD-10-CM | POA: Diagnosis not present

## 2014-10-28 DIAGNOSIS — J690 Pneumonitis due to inhalation of food and vomit: Secondary | ICD-10-CM | POA: Diagnosis not present

## 2014-10-28 DIAGNOSIS — I69391 Dysphagia following cerebral infarction: Secondary | ICD-10-CM | POA: Diagnosis not present

## 2014-10-28 LAB — CULTURE, BLOOD (ROUTINE X 2)
Culture: NO GROWTH
Culture: NO GROWTH

## 2014-10-31 ENCOUNTER — Telehealth: Payer: Self-pay | Admitting: Internal Medicine

## 2014-10-31 ENCOUNTER — Telehealth: Payer: Self-pay | Admitting: Cardiology

## 2014-10-31 ENCOUNTER — Ambulatory Visit (INDEPENDENT_AMBULATORY_CARE_PROVIDER_SITE_OTHER): Payer: Medicare Other | Admitting: *Deleted

## 2014-10-31 DIAGNOSIS — J690 Pneumonitis due to inhalation of food and vomit: Secondary | ICD-10-CM | POA: Diagnosis not present

## 2014-10-31 DIAGNOSIS — I495 Sick sinus syndrome: Secondary | ICD-10-CM

## 2014-10-31 DIAGNOSIS — I69391 Dysphagia following cerebral infarction: Secondary | ICD-10-CM | POA: Diagnosis not present

## 2014-10-31 DIAGNOSIS — I2510 Atherosclerotic heart disease of native coronary artery without angina pectoris: Secondary | ICD-10-CM | POA: Diagnosis not present

## 2014-10-31 DIAGNOSIS — E119 Type 2 diabetes mellitus without complications: Secondary | ICD-10-CM | POA: Diagnosis not present

## 2014-10-31 DIAGNOSIS — F0390 Unspecified dementia without behavioral disturbance: Secondary | ICD-10-CM | POA: Diagnosis not present

## 2014-10-31 DIAGNOSIS — R1310 Dysphagia, unspecified: Secondary | ICD-10-CM | POA: Diagnosis not present

## 2014-10-31 NOTE — Telephone Encounter (Signed)
Spoke with pt and reminded pt of remote transmission that is due today. Pt verbalized understanding.   

## 2014-10-31 NOTE — Telephone Encounter (Signed)
New Msg         Pt wife calling with questions about recent transmission for device.   Please return call.

## 2014-10-31 NOTE — Telephone Encounter (Signed)
New Msg        Pt in hospital last week and when he was released, his medications were changed.   Pt was diagnosed with pneumonia.   Please contact pt wife [name] at [phone].

## 2014-10-31 NOTE — Telephone Encounter (Signed)
Spoke with the patient's wife and he is to take what the discharge summary states.  His BP was low and he had no energy.  His Diltiazem was decreased in the hospital from 360mg  to 180mg .  His wife was concerned as Dr Rayann Heman had just increased in Oct for high V rates.  I have tried to reassure her that the [facility] can call if he has any problems.  He needs to get a follow up with his PCP

## 2014-10-31 NOTE — Telephone Encounter (Signed)
Number busy

## 2014-10-31 NOTE — Telephone Encounter (Signed)
Spoke w/ pt wife and she informed me that pt was in hospital from 10-23-14 through 10-26-14. When he was released from hospital he had the following medication changesD/c lasix, D/c potassium, Reduced diltiazem from 360 MG to 180 MG.

## 2014-10-31 NOTE — Progress Notes (Signed)
Remote pacemaker transmission.   

## 2014-11-01 DIAGNOSIS — I69391 Dysphagia following cerebral infarction: Secondary | ICD-10-CM | POA: Diagnosis not present

## 2014-11-01 DIAGNOSIS — R1310 Dysphagia, unspecified: Secondary | ICD-10-CM | POA: Diagnosis not present

## 2014-11-01 DIAGNOSIS — I2510 Atherosclerotic heart disease of native coronary artery without angina pectoris: Secondary | ICD-10-CM | POA: Diagnosis not present

## 2014-11-01 DIAGNOSIS — J690 Pneumonitis due to inhalation of food and vomit: Secondary | ICD-10-CM | POA: Diagnosis not present

## 2014-11-01 DIAGNOSIS — F0390 Unspecified dementia without behavioral disturbance: Secondary | ICD-10-CM | POA: Diagnosis not present

## 2014-11-01 DIAGNOSIS — E119 Type 2 diabetes mellitus without complications: Secondary | ICD-10-CM | POA: Diagnosis not present

## 2014-11-01 LAB — MDC_IDC_ENUM_SESS_TYPE_REMOTE
Battery Impedance: 206 Ohm
Battery Remaining Longevity: 138 mo
Battery Voltage: 2.79 V
Date Time Interrogation Session: 20160120174940
Lead Channel Impedance Value: 425 Ohm
Lead Channel Impedance Value: 67 Ohm
Lead Channel Pacing Threshold Pulse Width: 0.4 ms
Lead Channel Setting Pacing Amplitude: 2.5 V
Lead Channel Setting Pacing Pulse Width: 0.4 ms
Lead Channel Setting Sensing Sensitivity: 4 mV
MDC IDC MSMT LEADCHNL RV PACING THRESHOLD AMPLITUDE: 1 V
MDC IDC MSMT LEADCHNL RV SENSING INTR AMPL: 8 mV
MDC IDC STAT BRADY RV PERCENT PACED: 8 %

## 2014-11-05 DIAGNOSIS — I2510 Atherosclerotic heart disease of native coronary artery without angina pectoris: Secondary | ICD-10-CM | POA: Diagnosis not present

## 2014-11-05 DIAGNOSIS — F0390 Unspecified dementia without behavioral disturbance: Secondary | ICD-10-CM | POA: Diagnosis not present

## 2014-11-05 DIAGNOSIS — I69391 Dysphagia following cerebral infarction: Secondary | ICD-10-CM | POA: Diagnosis not present

## 2014-11-05 DIAGNOSIS — R1310 Dysphagia, unspecified: Secondary | ICD-10-CM | POA: Diagnosis not present

## 2014-11-05 DIAGNOSIS — E119 Type 2 diabetes mellitus without complications: Secondary | ICD-10-CM | POA: Diagnosis not present

## 2014-11-05 DIAGNOSIS — J690 Pneumonitis due to inhalation of food and vomit: Secondary | ICD-10-CM | POA: Diagnosis not present

## 2014-11-06 ENCOUNTER — Encounter: Payer: Self-pay | Admitting: Cardiology

## 2014-11-07 DIAGNOSIS — J690 Pneumonitis due to inhalation of food and vomit: Secondary | ICD-10-CM | POA: Diagnosis not present

## 2014-11-07 DIAGNOSIS — I482 Chronic atrial fibrillation: Secondary | ICD-10-CM | POA: Diagnosis not present

## 2014-11-09 DIAGNOSIS — I69391 Dysphagia following cerebral infarction: Secondary | ICD-10-CM | POA: Diagnosis not present

## 2014-11-09 DIAGNOSIS — E119 Type 2 diabetes mellitus without complications: Secondary | ICD-10-CM | POA: Diagnosis not present

## 2014-11-09 DIAGNOSIS — J690 Pneumonitis due to inhalation of food and vomit: Secondary | ICD-10-CM | POA: Diagnosis not present

## 2014-11-09 DIAGNOSIS — I2510 Atherosclerotic heart disease of native coronary artery without angina pectoris: Secondary | ICD-10-CM | POA: Diagnosis not present

## 2014-11-09 DIAGNOSIS — F0390 Unspecified dementia without behavioral disturbance: Secondary | ICD-10-CM | POA: Diagnosis not present

## 2014-11-09 DIAGNOSIS — R1310 Dysphagia, unspecified: Secondary | ICD-10-CM | POA: Diagnosis not present

## 2014-11-13 DIAGNOSIS — F0390 Unspecified dementia without behavioral disturbance: Secondary | ICD-10-CM | POA: Diagnosis not present

## 2014-11-13 DIAGNOSIS — I2510 Atherosclerotic heart disease of native coronary artery without angina pectoris: Secondary | ICD-10-CM | POA: Diagnosis not present

## 2014-11-13 DIAGNOSIS — E119 Type 2 diabetes mellitus without complications: Secondary | ICD-10-CM | POA: Diagnosis not present

## 2014-11-13 DIAGNOSIS — R1310 Dysphagia, unspecified: Secondary | ICD-10-CM | POA: Diagnosis not present

## 2014-11-13 DIAGNOSIS — I69391 Dysphagia following cerebral infarction: Secondary | ICD-10-CM | POA: Diagnosis not present

## 2014-11-13 DIAGNOSIS — J690 Pneumonitis due to inhalation of food and vomit: Secondary | ICD-10-CM | POA: Diagnosis not present

## 2014-11-15 ENCOUNTER — Encounter: Payer: Self-pay | Admitting: Internal Medicine

## 2014-11-15 ENCOUNTER — Ambulatory Visit: Payer: Medicare Other | Admitting: Physician Assistant

## 2014-11-19 ENCOUNTER — Encounter: Payer: Self-pay | Admitting: Internal Medicine

## 2014-11-19 ENCOUNTER — Ambulatory Visit (INDEPENDENT_AMBULATORY_CARE_PROVIDER_SITE_OTHER): Payer: Medicare Other | Admitting: Internal Medicine

## 2014-11-19 VITALS — BP 118/60 | HR 110 | Ht 69.0 in | Wt 186.0 lb

## 2014-11-19 DIAGNOSIS — I4891 Unspecified atrial fibrillation: Secondary | ICD-10-CM

## 2014-11-19 DIAGNOSIS — I2510 Atherosclerotic heart disease of native coronary artery without angina pectoris: Secondary | ICD-10-CM

## 2014-11-19 DIAGNOSIS — I10 Essential (primary) hypertension: Secondary | ICD-10-CM

## 2014-11-19 DIAGNOSIS — I481 Persistent atrial fibrillation: Secondary | ICD-10-CM

## 2014-11-19 DIAGNOSIS — I4819 Other persistent atrial fibrillation: Secondary | ICD-10-CM

## 2014-11-19 LAB — MDC_IDC_ENUM_SESS_TYPE_INCLINIC
Battery Impedance: 182 Ohm
Battery Voltage: 2.79 V
Date Time Interrogation Session: 20160208110205
Lead Channel Impedance Value: 415 Ohm
Lead Channel Impedance Value: 67 Ohm
Lead Channel Pacing Threshold Amplitude: 1 V
Lead Channel Sensing Intrinsic Amplitude: 11.2 mV
Lead Channel Setting Pacing Pulse Width: 0.4 ms
Lead Channel Setting Sensing Sensitivity: 4 mV
MDC IDC MSMT BATTERY REMAINING LONGEVITY: 143 mo
MDC IDC MSMT LEADCHNL RV PACING THRESHOLD PULSEWIDTH: 0.4 ms
MDC IDC SET LEADCHNL RV PACING AMPLITUDE: 2.5 V
MDC IDC STAT BRADY RV PERCENT PACED: 7 %

## 2014-11-19 MED ORDER — DILTIAZEM HCL ER COATED BEADS 180 MG PO CP24: 180.0000 mg | ORAL_CAPSULE | Freq: Two times a day (BID) | ORAL | Status: DC

## 2014-11-19 NOTE — Patient Instructions (Addendum)
Remote monitoring is used to monitor your pacemaker from home. This monitoring reduces the number of office visits required to check your device to one time per year. It allows [place] to keep an eye on the functioning of your device to ensure it is working properly. You are scheduled for a device check from home on 02-18-2015. You may send your transmission at any time that day. If you have a wireless device, the transmission will be sent automatically. After your physician reviews your transmission, you will receive a postcard with your next transmission date.  Your physician recommends that you schedule a follow-up appointment in: 12 months with Dr.Allred  Your physician recommends that you schedule a follow-up appointment in: 4 weeks with Dr Marlou Porch  Your physician has recommended you make the following change in your medication:  1) Increase Diltiazem to 180 mg twice daily

## 2014-11-19 NOTE — Progress Notes (Signed)
PCP: Wenda Low, MD Primary Cardiologist:  Dr Marlou Porch  The patient presents today for routine electrophysiology followup.  He has permanent afib which he is tolerating.  He was recently hospitalized with pneumonia but is making slow improvement.  His lasix was stopped and diltiazem reduced from 360mg  daily to 180mg  daily at discharge.  His HRs and BPs have been more labile. Today, he denies symptoms of palpitations, chest pain, shortness of breath (above baseline), orthopnea, PND,   presyncope, syncope, or neurologic sequela. He has mild edema. The patient feels that he is tolerating medications without difficulties and is otherwise without complaint today.   Past Medical History  Diagnosis Date  . CVA (cerebral infarction) 1999  . DDD (degenerative disc disease), cervical   . Memory impairment   . Persistent atrial fibrillation     on pradaxa  . Coronary artery disease     s/p 5v CABG 2002  . Depression   . GERD (gastroesophageal reflux disease)   . Hyperlipidemia   . Hypertension   . PVD (peripheral vascular disease)   . 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 OSA (obstructive sleep apnea)     Recently diagnosed though pt. has not had CPAP trial  . Seizures   . Pacemaker   . Prostate cancer 2006    s/p XRT   Past Surgical History  Procedure Laterality Date  . Coronary artery bypass graft  2002  . Cea  1999  . Pacemaker insertion      implanted by JA 6/12  . Insert / replace / remove pacemaker    . Cardioversion N/A 08/21/2013    Procedure: CARDIOVERSION;  Surgeon: Candee Furbish, MD;  Location: [facility] ENDOSCOPY;  Service: Cardiovascular;  Laterality: N/A;    Current Outpatient Prescriptions  Medication Sig Dispense Refill  . atorvastatin (LIPITOR) 40 MG tablet TAKE 1 TABLET BY MOUTH ONCE DAILY 30 tablet 5  . diltiazem (CARDIZEM CD) 180 MG 24 hr capsule Take 1 capsule (180 mg total) by mouth 2 (two) times daily. 180 capsule 3  . FLUoxetine (PROZAC) 40 MG capsule Take  40 mg by mouth daily.      . Lutein 20 MG CAPS Take 1 capsule by mouth daily.     . Multiple Vitamins-Minerals (MULTIVITAMIN WITH MINERALS) tablet Take 1 tablet by mouth daily. Chewable flinstones    . omeprazole (PRILOSEC OTC) 20 MG tablet Take 20 mg by mouth 2 (two) times daily.      Marland Kitchen PRADAXA 150 MG CAPS capsule TAKE 1 CAPSULE BY MOUTH TWICE DAILY 60 capsule 11  . topiramate (TOPAMAX) 50 MG tablet Take 1 tablet (50 mg total) by mouth 2 (two) times daily. 180 tablet 4   No current facility-administered medications for this visit.    Allergies  Allergen Reactions  . Ramipril Other (See Comments)     cough    History   Social History  . Marital Status: Married    Spouse Name: [name]    Number of Children: 3  . Years of Education: N/A   Occupational History  . [employer]   Social History Main Topics  . Smoking status: Former Smoker -- 2.00 packs/day for 48 years    Types: Cigarettes    Quit date: 03/09/1998  . Smokeless tobacco: Never Used  . Alcohol Use: No     Comment: quit: 1999  . Drug Use: No  . Sexual Activity: No   Other Topics Concern  .  Not on file   Social History Narrative   Pt lives in [place] with spouse.  Retired.  Prior Biochemist, clinical for [employer].   Caffeine Use: once daily   Married 20 years    Family History  Problem Relation Age of Onset  . Ovarian cancer    . Pancreatic cancer    . Lung cancer    . Cancer Mother     ovarian  . Ovarian cancer Mother   . Cancer Father     pancreatic  . Pancreatic cancer Father   . Cancer Brother     lung    Physical Exam: Filed Vitals:   11/19/14 1012  BP: 118/60  Pulse: 110  Height: 5\' 9"  (1.753 m)  Weight: 186 lb (84.369 kg)    GEN- The patient is well appearing, alert but confused, difficulty with expressive aphasia Head- normocephalic, atraumatic Eyes-  Sclera clear, conjunctiva pink Ears- hearing intact Oropharynx- clear Neck- supple, no JVP Lungs- coarse BS at  the L base, normal work of breathing Chest- pacemaker pocket is well healed Heart- tachycardic irregular rhythm, no murmurs, rubs or gallops, PMI not laterally displaced GI- soft, NT, ND, + BS Extremities- no clubbing, cyanosis, +1 edema Very flat affect  Pacemaker interrogation- reviewed in detail today,  See PACEART report  Assessment and Plan:  1. Tachycardia/ bradycardia syndrome Normal pacemaker function See Pace Art report No device changes today V rates are frequently elevated Increase diltiazem to 180mg  twice daily Continue pradaxa.  Dr Marlou Porch to follow [facility].   2. Permanent Afib Continue anticoagulation long term Increase dilt as above  3. Chronic diastolic dysfunction Stable off of lasix No changes today  4. Hypertension Stable No change required today  5. Cad No ischemic symptoms No changes today  Follow-up with Dr Marlou Porch in 4-6 weeks to make sure that he tolerates the above changes. carelink every 3 months I will see again in 1 year

## 2014-11-20 DIAGNOSIS — E119 Type 2 diabetes mellitus without complications: Secondary | ICD-10-CM | POA: Diagnosis not present

## 2014-11-20 DIAGNOSIS — R1310 Dysphagia, unspecified: Secondary | ICD-10-CM | POA: Diagnosis not present

## 2014-11-20 DIAGNOSIS — I69391 Dysphagia following cerebral infarction: Secondary | ICD-10-CM | POA: Diagnosis not present

## 2014-11-20 DIAGNOSIS — J690 Pneumonitis due to inhalation of food and vomit: Secondary | ICD-10-CM | POA: Diagnosis not present

## 2014-11-20 DIAGNOSIS — F0390 Unspecified dementia without behavioral disturbance: Secondary | ICD-10-CM | POA: Diagnosis not present

## 2014-11-20 DIAGNOSIS — I2510 Atherosclerotic heart disease of native coronary artery without angina pectoris: Secondary | ICD-10-CM | POA: Diagnosis not present

## 2014-11-22 ENCOUNTER — Encounter: Payer: Self-pay | Admitting: Internal Medicine

## 2014-11-22 DIAGNOSIS — I69391 Dysphagia following cerebral infarction: Secondary | ICD-10-CM | POA: Diagnosis not present

## 2014-11-22 DIAGNOSIS — R1310 Dysphagia, unspecified: Secondary | ICD-10-CM | POA: Diagnosis not present

## 2014-11-22 DIAGNOSIS — F0390 Unspecified dementia without behavioral disturbance: Secondary | ICD-10-CM | POA: Diagnosis not present

## 2014-11-22 DIAGNOSIS — J690 Pneumonitis due to inhalation of food and vomit: Secondary | ICD-10-CM | POA: Diagnosis not present

## 2014-11-22 DIAGNOSIS — E119 Type 2 diabetes mellitus without complications: Secondary | ICD-10-CM | POA: Diagnosis not present

## 2014-11-22 DIAGNOSIS — I2510 Atherosclerotic heart disease of native coronary artery without angina pectoris: Secondary | ICD-10-CM | POA: Diagnosis not present

## 2014-11-25 DIAGNOSIS — R1310 Dysphagia, unspecified: Secondary | ICD-10-CM | POA: Diagnosis not present

## 2014-11-25 DIAGNOSIS — I69391 Dysphagia following cerebral infarction: Secondary | ICD-10-CM | POA: Diagnosis not present

## 2014-11-25 DIAGNOSIS — J690 Pneumonitis due to inhalation of food and vomit: Secondary | ICD-10-CM | POA: Diagnosis not present

## 2014-11-25 DIAGNOSIS — F0390 Unspecified dementia without behavioral disturbance: Secondary | ICD-10-CM | POA: Diagnosis not present

## 2014-11-25 DIAGNOSIS — I2510 Atherosclerotic heart disease of native coronary artery without angina pectoris: Secondary | ICD-10-CM | POA: Diagnosis not present

## 2014-11-25 DIAGNOSIS — E119 Type 2 diabetes mellitus without complications: Secondary | ICD-10-CM | POA: Diagnosis not present

## 2014-11-28 ENCOUNTER — Telehealth: Payer: Self-pay | Admitting: Internal Medicine

## 2014-11-28 NOTE — Telephone Encounter (Signed)
Spoke with wife asked her to call PCP to obtain DNR

## 2014-11-28 NOTE — Telephone Encounter (Signed)
New message     Pt saw Dr Rayann Heman recently.  He want a DNR order on file.  How do they go about getting that done?

## 2014-12-04 DIAGNOSIS — E119 Type 2 diabetes mellitus without complications: Secondary | ICD-10-CM | POA: Diagnosis not present

## 2014-12-04 DIAGNOSIS — F0390 Unspecified dementia without behavioral disturbance: Secondary | ICD-10-CM | POA: Diagnosis not present

## 2014-12-04 DIAGNOSIS — I2510 Atherosclerotic heart disease of native coronary artery without angina pectoris: Secondary | ICD-10-CM | POA: Diagnosis not present

## 2014-12-04 DIAGNOSIS — I69391 Dysphagia following cerebral infarction: Secondary | ICD-10-CM | POA: Diagnosis not present

## 2014-12-04 DIAGNOSIS — J690 Pneumonitis due to inhalation of food and vomit: Secondary | ICD-10-CM | POA: Diagnosis not present

## 2014-12-04 DIAGNOSIS — R1310 Dysphagia, unspecified: Secondary | ICD-10-CM | POA: Diagnosis not present

## 2014-12-05 DIAGNOSIS — F0390 Unspecified dementia without behavioral disturbance: Secondary | ICD-10-CM | POA: Diagnosis not present

## 2014-12-05 DIAGNOSIS — J690 Pneumonitis due to inhalation of food and vomit: Secondary | ICD-10-CM | POA: Diagnosis not present

## 2014-12-05 DIAGNOSIS — E119 Type 2 diabetes mellitus without complications: Secondary | ICD-10-CM | POA: Diagnosis not present

## 2014-12-05 DIAGNOSIS — I2510 Atherosclerotic heart disease of native coronary artery without angina pectoris: Secondary | ICD-10-CM | POA: Diagnosis not present

## 2014-12-05 DIAGNOSIS — R1310 Dysphagia, unspecified: Secondary | ICD-10-CM | POA: Diagnosis not present

## 2014-12-05 DIAGNOSIS — I69391 Dysphagia following cerebral infarction: Secondary | ICD-10-CM | POA: Diagnosis not present

## 2014-12-11 DIAGNOSIS — I2510 Atherosclerotic heart disease of native coronary artery without angina pectoris: Secondary | ICD-10-CM | POA: Diagnosis not present

## 2014-12-11 DIAGNOSIS — E119 Type 2 diabetes mellitus without complications: Secondary | ICD-10-CM | POA: Diagnosis not present

## 2014-12-11 DIAGNOSIS — J690 Pneumonitis due to inhalation of food and vomit: Secondary | ICD-10-CM | POA: Diagnosis not present

## 2014-12-11 DIAGNOSIS — I69391 Dysphagia following cerebral infarction: Secondary | ICD-10-CM | POA: Diagnosis not present

## 2014-12-11 DIAGNOSIS — F0390 Unspecified dementia without behavioral disturbance: Secondary | ICD-10-CM | POA: Diagnosis not present

## 2014-12-11 DIAGNOSIS — R1310 Dysphagia, unspecified: Secondary | ICD-10-CM | POA: Diagnosis not present

## 2014-12-12 DIAGNOSIS — J690 Pneumonitis due to inhalation of food and vomit: Secondary | ICD-10-CM | POA: Diagnosis not present

## 2014-12-12 DIAGNOSIS — E119 Type 2 diabetes mellitus without complications: Secondary | ICD-10-CM | POA: Diagnosis not present

## 2014-12-12 DIAGNOSIS — F0390 Unspecified dementia without behavioral disturbance: Secondary | ICD-10-CM | POA: Diagnosis not present

## 2014-12-12 DIAGNOSIS — I2510 Atherosclerotic heart disease of native coronary artery without angina pectoris: Secondary | ICD-10-CM | POA: Diagnosis not present

## 2014-12-12 DIAGNOSIS — I69391 Dysphagia following cerebral infarction: Secondary | ICD-10-CM | POA: Diagnosis not present

## 2014-12-12 DIAGNOSIS — R1310 Dysphagia, unspecified: Secondary | ICD-10-CM | POA: Diagnosis not present

## 2014-12-19 DIAGNOSIS — I69391 Dysphagia following cerebral infarction: Secondary | ICD-10-CM | POA: Diagnosis not present

## 2014-12-19 DIAGNOSIS — F0390 Unspecified dementia without behavioral disturbance: Secondary | ICD-10-CM | POA: Diagnosis not present

## 2014-12-19 DIAGNOSIS — I2510 Atherosclerotic heart disease of native coronary artery without angina pectoris: Secondary | ICD-10-CM | POA: Diagnosis not present

## 2014-12-19 DIAGNOSIS — J690 Pneumonitis due to inhalation of food and vomit: Secondary | ICD-10-CM | POA: Diagnosis not present

## 2014-12-19 DIAGNOSIS — E119 Type 2 diabetes mellitus without complications: Secondary | ICD-10-CM | POA: Diagnosis not present

## 2014-12-19 DIAGNOSIS — R1310 Dysphagia, unspecified: Secondary | ICD-10-CM | POA: Diagnosis not present

## 2014-12-25 ENCOUNTER — Encounter: Payer: Self-pay | Admitting: Cardiology

## 2014-12-25 ENCOUNTER — Ambulatory Visit (INDEPENDENT_AMBULATORY_CARE_PROVIDER_SITE_OTHER): Payer: Medicare Other | Admitting: Cardiology

## 2014-12-25 VITALS — BP 107/54 | HR 90 | Ht 69.0 in | Wt 180.0 lb

## 2014-12-25 DIAGNOSIS — I481 Persistent atrial fibrillation: Secondary | ICD-10-CM | POA: Diagnosis not present

## 2014-12-25 DIAGNOSIS — I4819 Other persistent atrial fibrillation: Secondary | ICD-10-CM

## 2014-12-25 DIAGNOSIS — I2510 Atherosclerotic heart disease of native coronary artery without angina pectoris: Secondary | ICD-10-CM | POA: Diagnosis not present

## 2014-12-25 DIAGNOSIS — I495 Sick sinus syndrome: Secondary | ICD-10-CM

## 2014-12-25 DIAGNOSIS — Z950 Presence of cardiac pacemaker: Secondary | ICD-10-CM

## 2014-12-25 NOTE — Patient Instructions (Signed)
The current medical regimen is effective;  continue present plan and medications.  Follow up in 6 months with Dr. Skains.  You will receive a letter in the mail 2 months before you are due.  Please call us when you receive this letter to schedule your follow up appointment.  Thank you for choosing [facility]!!     

## 2014-12-25 NOTE — Progress Notes (Signed)
[facility]. [address], [address] [place], [place]  [address] Phone: [phone] Fax:  [phone]  Date:  12/25/2014   ID:  Zachary Warren, DOB 1933/01/08, MRN [id number]  PCP:  Wenda Low, MD   History of Present Illness: Zachary Warren is a 81 y.o. male with a hx of coronary artery disease status post bypass in 2003,and Atrial fibrillation cardioversion, x 3, failed amiodarone secondary to return of atrial fibrillation despite maintenance therapy, discontinuation of amiodarone on 11/14 with no change in symptoms here for followup. He's currently on anticoagulation with Pradaxa. He had pacemaker placed by Dr. Rayann Heman 6/12.   His pacemaker is functioning well. He had it checked and he is being paced 96% of the time.  He was in the hospital for fever of unknown origin, as high as 104? he is doing better. He has been complaining of some fatigue. Blood pressure has been running slightly low.  On 10/16/13 he maintained his atrial fibrillation but his heart rate is now 101 beats per minute. I decided to start him on diltiazem CD 120 mg. He reports no syncope, no fevers, no chills, no significant shortness of breath. He has been battling a cough with mucous production.  12/25/13-diltiazem increased previously 240 mg once a day. Better overall rate control. He did have a brief 1 minute episode of left-sided chest discomfort fleeting at rest. No further occurrence. Non-exertional. Continuing to monitor. He has done an excellent job with weight loss.  08/28/14-reviewed Dr. Jackalyn Lombard prior note from 07/30/14-heart rate was elevated. He increased diltiazem to 360. Lasix was decreased to 20 mg a day because of hypotension. Potassium supplement was also decreased at that time as well.   12/25/14-office visit from Dr. Rayann Heman reviewed from 11/19/14.  In review, hospitalized with pneumonia , slow improvement, diltiazem reduced from 360 down to 180 at discharge. Heart rates more labile. Dr. Rayann Heman increased his  diltiazem 180 twice a day , ventricular rates were quite elevated frequently. He is here today to make sure that he is tolerating the change. He is stable off Lasix.   Wt Readings from Last 3 Encounters:  12/25/14 180 lb (81.647 kg)  11/19/14 186 lb (84.369 kg)  10/23/14 187 lb (84.823 kg)     Past Medical History  Diagnosis Date  . CVA (cerebral infarction) 1999  . DDD (degenerative disc disease), cervical   . Memory impairment   . Persistent atrial fibrillation     on pradaxa  . Coronary artery disease     s/p 5v CABG 2002  . Depression   . GERD (gastroesophageal reflux disease)   . Hyperlipidemia   . Hypertension   . PVD (peripheral vascular disease)   . 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 OSA (obstructive sleep apnea)     Recently diagnosed though pt. has not had CPAP trial  . Seizures   . Pacemaker   . Prostate cancer 2006    s/p XRT    Past Surgical History  Procedure Laterality Date  . Coronary artery bypass graft  2002  . Cea  1999  . Pacemaker insertion      implanted by JA 6/12  . Insert / replace / remove pacemaker    . Cardioversion N/A 08/21/2013    Procedure: CARDIOVERSION;  Surgeon: Candee Furbish, MD;  Location: [facility] ENDOSCOPY;  Service: Cardiovascular;  Laterality: N/A;    Current Outpatient Prescriptions  Medication Sig Dispense Refill  . atorvastatin (LIPITOR)  40 MG tablet TAKE 1 TABLET BY MOUTH ONCE DAILY 30 tablet 5  . diltiazem (CARDIZEM CD) 180 MG 24 hr capsule Take 1 capsule (180 mg total) by mouth 2 (two) times daily. 180 capsule 3  . FLUoxetine (PROZAC) 40 MG capsule Take 40 mg by mouth daily.      . Lutein 20 MG CAPS Take 1 capsule by mouth daily.     . Multiple Vitamins-Minerals (MULTIVITAMIN WITH MINERALS) tablet Take 1 tablet by mouth daily. Chewable flinstones    . omeprazole (PRILOSEC OTC) 20 MG tablet Take 20 mg by mouth 2 (two) times daily.      Marland Kitchen PRADAXA 150 MG CAPS capsule TAKE 1 CAPSULE BY MOUTH TWICE DAILY 60 capsule  11  . topiramate (TOPAMAX) 50 MG tablet Take 1 tablet (50 mg total) by mouth 2 (two) times daily. 180 tablet 4   No current facility-administered medications for this visit.    Allergies:    Allergies  Allergen Reactions  . Ramipril Other (See Comments)     cough    Social History:  The patient  reports that he quit smoking about 16 years ago. His smoking use included Cigarettes. He has a 96 pack-year smoking history. He has never used smokeless tobacco. He reports that he does not drink alcohol or use illicit drugs.   ROS:  Please see the history of present illness.   Positive for atrial fibrillation, occasional fatigue. No change after cardioversion.  PHYSICAL EXAM: VS:  BP 107/54 mmHg  Pulse 90  Ht 5\' 9"  (1.753 m)  Wt 180 lb (81.647 kg)  BMI 26.57 kg/m2 Well nourished, well developed, in no acute distress HEENT: normal Neck: no JVD Cardiac:  Irregularly irregular,  mildly tachycardic; no murmur Lungs:  Upper airway congestion notedLeft mild crackles. Abd: soft, nontender, no hepatomegalyOverweight, improved Ext: no edema Skin: warm and dry Neuro: no focal abnormalities noted  EKG:  10/16/13-heart rate 103, atrial fibrillation     ASSESSMENT AND PLAN:  1. Atrial fibrillation- He is in persistent atrial fibrillation. Rate control has been somewhat challenging off of amiodarone.  At last visit, his diltiazem was increased to 180 mg twice a day after this was decreased to 180 mg once a day on hospital discharge after pneumonia on 10/26/14.  On exam today, his heart rate still remains mildly elevated however he is tolerating this well and his blood pressure remains soft. We could consider addition of digoxin in the future however this is not a good rate controlling agent.  He did not feel any different after cardioversion in 11/14. Took him off of antiarrhythmic amiodarone because of persistent A. fib. Hence, we stopped his amiodarone in December of 2015. He has been on Toprol in the  past and I do believe that we have stopped this because of potential fatigue.  With his weight loss of over 50 pounds, he will be challenging fresh to up titrate these medications because his blood pressure is quite soft. 2. Chronic anticoagulation- Pradaxa. Keeping up with lab work. 3. CAD - stable, no angina 4. Hyperlipidemia - LDL goal 70.  5. Tachycardia/bradycardia syndrome-pacemaker functioning well. 6. Obesity- doing very well. He is eating thickened liquids. 7. Upper airway congestion/mucous production-encouraged Mucinex. Do not use decongestant. 8. We'll see him back in 6 months.  Signed,         Candee Furbish, MD [facility]  12/25/2014 10:16 AM

## 2014-12-26 DIAGNOSIS — R1310 Dysphagia, unspecified: Secondary | ICD-10-CM | POA: Diagnosis not present

## 2014-12-26 DIAGNOSIS — F0390 Unspecified dementia without behavioral disturbance: Secondary | ICD-10-CM | POA: Diagnosis not present

## 2014-12-26 DIAGNOSIS — I2510 Atherosclerotic heart disease of native coronary artery without angina pectoris: Secondary | ICD-10-CM | POA: Diagnosis not present

## 2014-12-26 DIAGNOSIS — I69391 Dysphagia following cerebral infarction: Secondary | ICD-10-CM | POA: Diagnosis not present

## 2014-12-26 DIAGNOSIS — E119 Type 2 diabetes mellitus without complications: Secondary | ICD-10-CM | POA: Diagnosis not present

## 2014-12-26 DIAGNOSIS — J690 Pneumonitis due to inhalation of food and vomit: Secondary | ICD-10-CM | POA: Diagnosis not present

## 2014-12-27 DIAGNOSIS — J690 Pneumonitis due to inhalation of food and vomit: Secondary | ICD-10-CM | POA: Diagnosis not present

## 2014-12-27 DIAGNOSIS — E785 Hyperlipidemia, unspecified: Secondary | ICD-10-CM | POA: Diagnosis not present

## 2014-12-27 DIAGNOSIS — I69391 Dysphagia following cerebral infarction: Secondary | ICD-10-CM | POA: Diagnosis not present

## 2014-12-27 DIAGNOSIS — F0390 Unspecified dementia without behavioral disturbance: Secondary | ICD-10-CM | POA: Diagnosis not present

## 2014-12-27 DIAGNOSIS — I2510 Atherosclerotic heart disease of native coronary artery without angina pectoris: Secondary | ICD-10-CM | POA: Diagnosis not present

## 2014-12-27 DIAGNOSIS — E119 Type 2 diabetes mellitus without complications: Secondary | ICD-10-CM | POA: Diagnosis not present

## 2014-12-27 DIAGNOSIS — Z8701 Personal history of pneumonia (recurrent): Secondary | ICD-10-CM | POA: Diagnosis not present

## 2014-12-27 DIAGNOSIS — R1310 Dysphagia, unspecified: Secondary | ICD-10-CM | POA: Diagnosis not present

## 2014-12-27 DIAGNOSIS — I4891 Unspecified atrial fibrillation: Secondary | ICD-10-CM | POA: Diagnosis not present

## 2015-01-04 DIAGNOSIS — R1310 Dysphagia, unspecified: Secondary | ICD-10-CM | POA: Diagnosis not present

## 2015-01-04 DIAGNOSIS — E119 Type 2 diabetes mellitus without complications: Secondary | ICD-10-CM | POA: Diagnosis not present

## 2015-01-04 DIAGNOSIS — I2510 Atherosclerotic heart disease of native coronary artery without angina pectoris: Secondary | ICD-10-CM | POA: Diagnosis not present

## 2015-01-04 DIAGNOSIS — F0390 Unspecified dementia without behavioral disturbance: Secondary | ICD-10-CM | POA: Diagnosis not present

## 2015-01-04 DIAGNOSIS — I69391 Dysphagia following cerebral infarction: Secondary | ICD-10-CM | POA: Diagnosis not present

## 2015-01-04 DIAGNOSIS — J690 Pneumonitis due to inhalation of food and vomit: Secondary | ICD-10-CM | POA: Diagnosis not present

## 2015-01-11 DIAGNOSIS — E119 Type 2 diabetes mellitus without complications: Secondary | ICD-10-CM | POA: Diagnosis not present

## 2015-01-11 DIAGNOSIS — R1310 Dysphagia, unspecified: Secondary | ICD-10-CM | POA: Diagnosis not present

## 2015-01-11 DIAGNOSIS — I69391 Dysphagia following cerebral infarction: Secondary | ICD-10-CM | POA: Diagnosis not present

## 2015-01-11 DIAGNOSIS — F0390 Unspecified dementia without behavioral disturbance: Secondary | ICD-10-CM | POA: Diagnosis not present

## 2015-01-11 DIAGNOSIS — I2510 Atherosclerotic heart disease of native coronary artery without angina pectoris: Secondary | ICD-10-CM | POA: Diagnosis not present

## 2015-01-11 DIAGNOSIS — J690 Pneumonitis due to inhalation of food and vomit: Secondary | ICD-10-CM | POA: Diagnosis not present

## 2015-01-11 IMAGING — CR DG CHEST 2V
2 series · 2 of 2 positions shown · non-contrast
Comparison: June 28, 2014.

CLINICAL DATA: Weakness.

EXAM:
CHEST  2 VIEW

[chest pa]
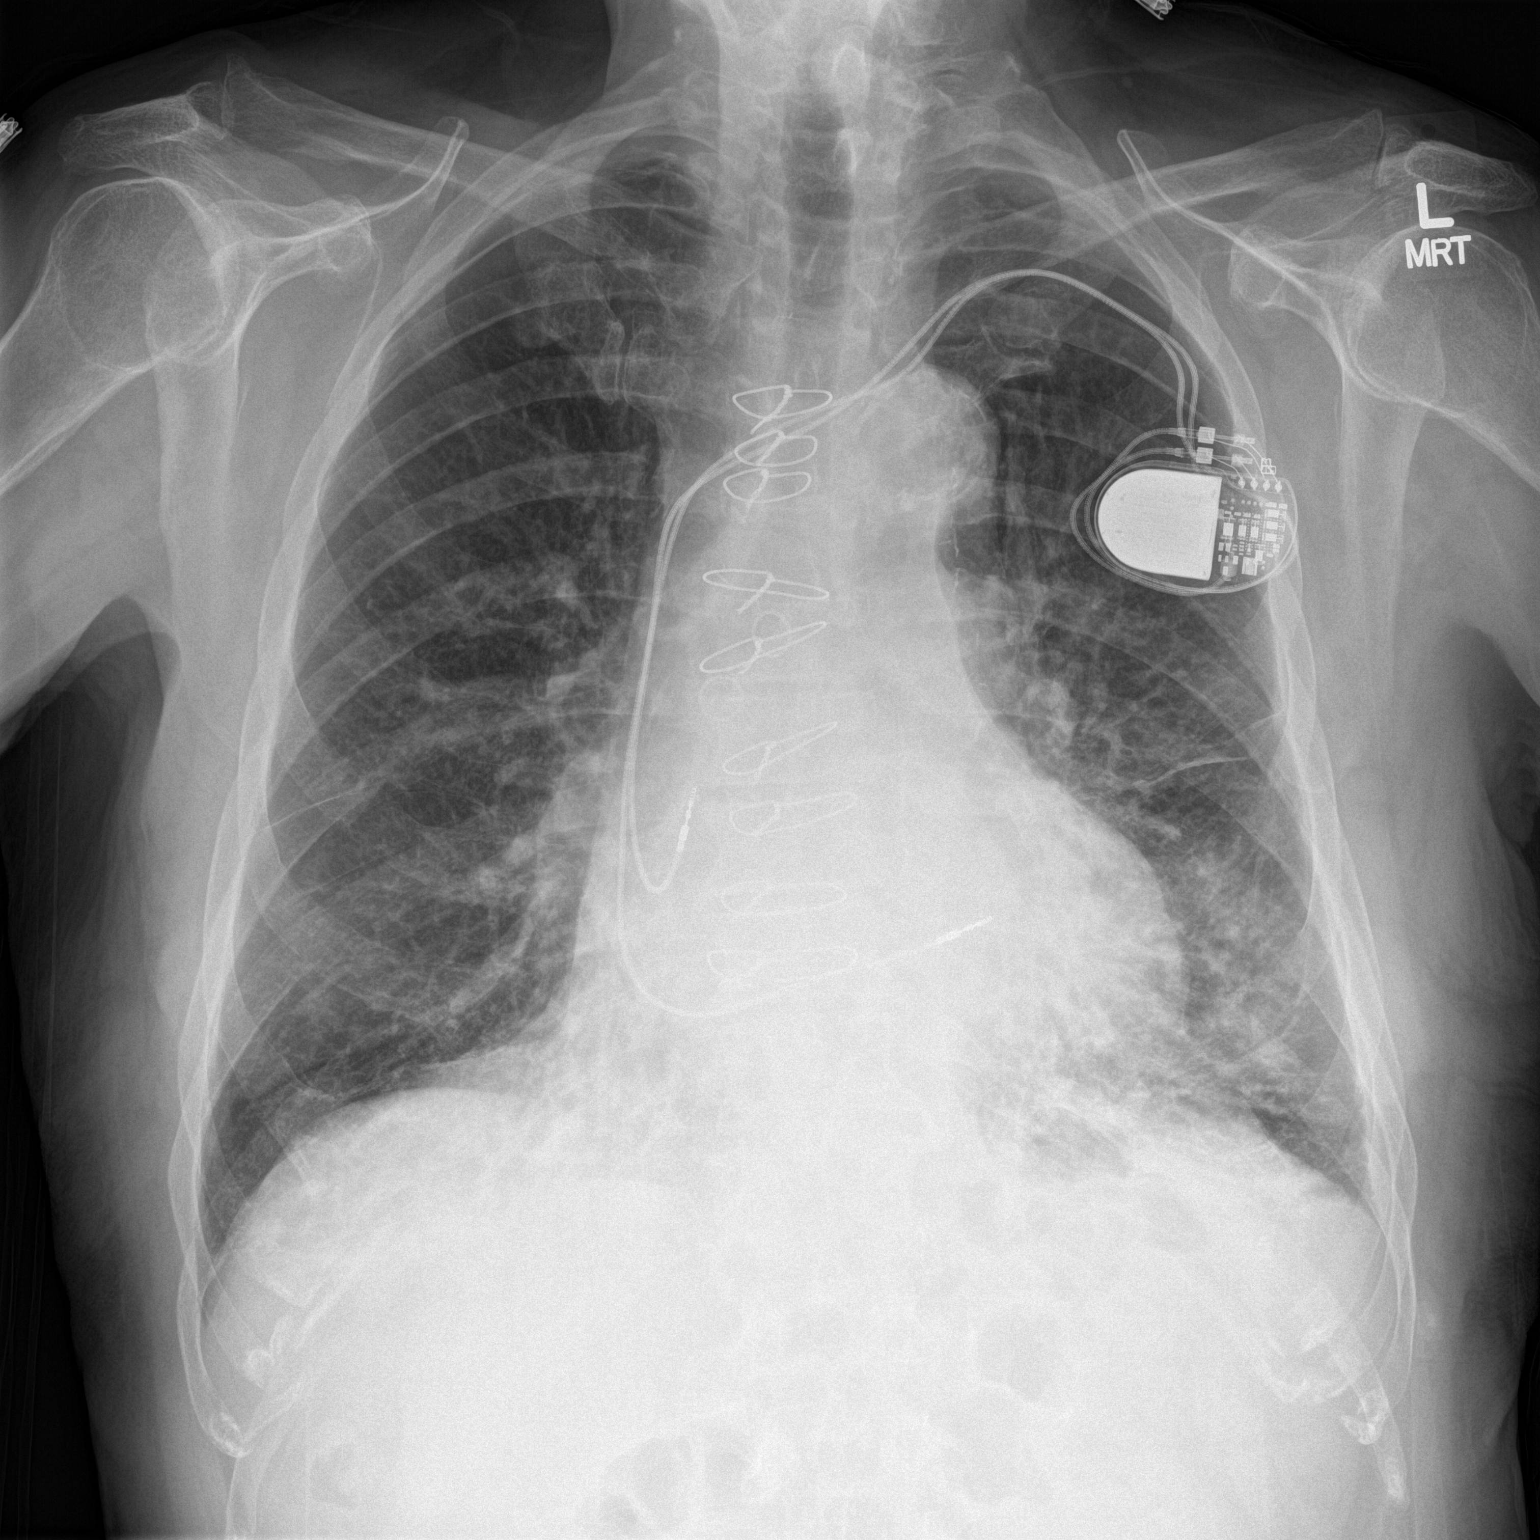

[chest lat]
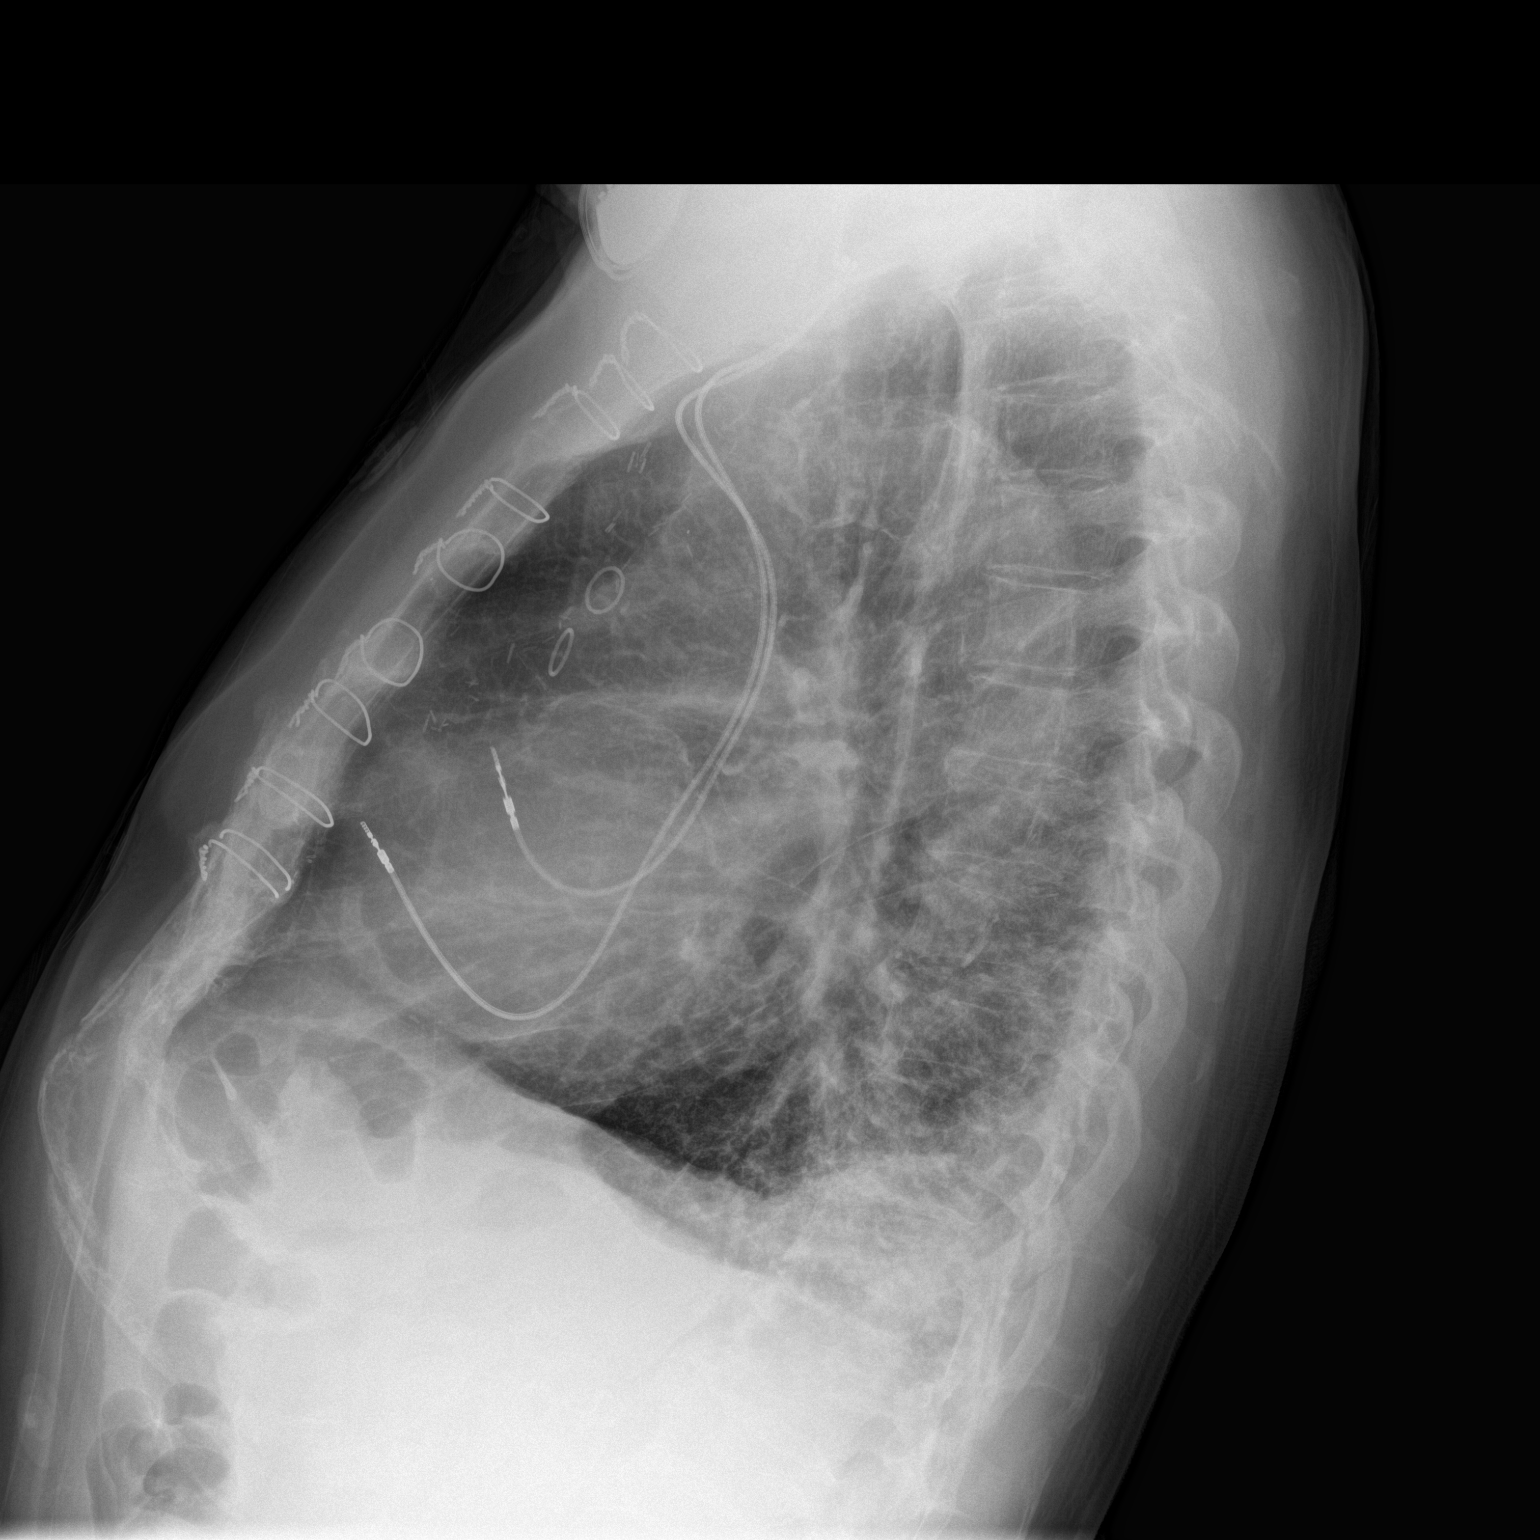

[2 of 2 positions shown; findings below may reference images not displayed]

FINDINGS: Stable cardiomediastinal silhouette. Status post coronary artery
bypass graft. Left-sided pacemaker is unchanged in position. No
pneumothorax or pleural effusion is noted. Increased left lower lobe
opacity is noted concerning for pneumonia. Bony thorax is intact.
IMPRESSION: Increased left lower lobe opacity is noted concerning for pneumonia.
Followup radiographs are recommended until resolution.

## 2015-01-25 DIAGNOSIS — R1310 Dysphagia, unspecified: Secondary | ICD-10-CM | POA: Diagnosis not present

## 2015-01-25 DIAGNOSIS — J690 Pneumonitis due to inhalation of food and vomit: Secondary | ICD-10-CM | POA: Diagnosis not present

## 2015-01-25 DIAGNOSIS — E119 Type 2 diabetes mellitus without complications: Secondary | ICD-10-CM | POA: Diagnosis not present

## 2015-01-25 DIAGNOSIS — I2510 Atherosclerotic heart disease of native coronary artery without angina pectoris: Secondary | ICD-10-CM | POA: Diagnosis not present

## 2015-01-25 DIAGNOSIS — I69391 Dysphagia following cerebral infarction: Secondary | ICD-10-CM | POA: Diagnosis not present

## 2015-01-25 DIAGNOSIS — F0390 Unspecified dementia without behavioral disturbance: Secondary | ICD-10-CM | POA: Diagnosis not present

## 2015-01-30 ENCOUNTER — Ambulatory Visit (INDEPENDENT_AMBULATORY_CARE_PROVIDER_SITE_OTHER): Payer: Medicare Other | Admitting: *Deleted

## 2015-01-30 ENCOUNTER — Telehealth: Payer: Self-pay | Admitting: Internal Medicine

## 2015-01-30 ENCOUNTER — Telehealth: Payer: Self-pay | Admitting: Cardiology

## 2015-01-30 DIAGNOSIS — I495 Sick sinus syndrome: Secondary | ICD-10-CM | POA: Diagnosis not present

## 2015-01-30 LAB — MDC_IDC_ENUM_SESS_TYPE_REMOTE
Battery Remaining Longevity: 143 mo
Battery Voltage: 2.79 V
Brady Statistic RV Percent Paced: 7 %
Lead Channel Impedance Value: 424 Ohm
Lead Channel Pacing Threshold Pulse Width: 0.4 ms
Lead Channel Setting Pacing Amplitude: 2.5 V
Lead Channel Setting Pacing Pulse Width: 0.4 ms
MDC IDC MSMT BATTERY IMPEDANCE: 182 Ohm
MDC IDC MSMT LEADCHNL RA IMPEDANCE VALUE: 67 Ohm
MDC IDC MSMT LEADCHNL RV PACING THRESHOLD AMPLITUDE: 1 V
MDC IDC MSMT LEADCHNL RV SENSING INTR AMPL: 8 mV
MDC IDC SESS DTM: 20160420153208
MDC IDC SET LEADCHNL RV SENSING SENSITIVITY: 4 mV

## 2015-01-30 NOTE — Telephone Encounter (Signed)
New message      Did you get his remote transmission?

## 2015-01-30 NOTE — Progress Notes (Signed)
Remote pacemaker transmission.   

## 2015-01-30 NOTE — Telephone Encounter (Signed)
Informed pt wife that transmission was received.  

## 2015-01-30 NOTE — Telephone Encounter (Signed)
Confirmed remote transmission w/ pt wife.   

## 2015-02-06 ENCOUNTER — Telehealth: Payer: Self-pay | Admitting: *Deleted

## 2015-02-06 DIAGNOSIS — E782 Mixed hyperlipidemia: Secondary | ICD-10-CM | POA: Diagnosis not present

## 2015-02-06 DIAGNOSIS — R7309 Other abnormal glucose: Secondary | ICD-10-CM | POA: Diagnosis not present

## 2015-02-06 DIAGNOSIS — I10 Essential (primary) hypertension: Secondary | ICD-10-CM | POA: Diagnosis not present

## 2015-02-06 DIAGNOSIS — I25810 Atherosclerosis of coronary artery bypass graft(s) without angina pectoris: Secondary | ICD-10-CM | POA: Diagnosis not present

## 2015-02-06 DIAGNOSIS — F324 Major depressive disorder, single episode, in partial remission: Secondary | ICD-10-CM | POA: Diagnosis not present

## 2015-02-06 DIAGNOSIS — I739 Peripheral vascular disease, unspecified: Secondary | ICD-10-CM | POA: Diagnosis not present

## 2015-02-06 DIAGNOSIS — C61 Malignant neoplasm of prostate: Secondary | ICD-10-CM | POA: Diagnosis not present

## 2015-02-06 DIAGNOSIS — Z1389 Encounter for screening for other disorder: Secondary | ICD-10-CM | POA: Diagnosis not present

## 2015-02-06 DIAGNOSIS — N183 Chronic kidney disease, stage 3 (moderate): Secondary | ICD-10-CM | POA: Diagnosis not present

## 2015-02-06 DIAGNOSIS — Z0000 Encounter for general adult medical examination without abnormal findings: Secondary | ICD-10-CM | POA: Diagnosis not present

## 2015-02-06 DIAGNOSIS — M6281 Muscle weakness (generalized): Secondary | ICD-10-CM | POA: Diagnosis not present

## 2015-02-06 DIAGNOSIS — I69391 Dysphagia following cerebral infarction: Secondary | ICD-10-CM | POA: Diagnosis not present

## 2015-02-06 NOTE — Telephone Encounter (Signed)
Pradaxa samples placed at the front desk for patient. 

## 2015-02-07 DIAGNOSIS — I2510 Atherosclerotic heart disease of native coronary artery without angina pectoris: Secondary | ICD-10-CM | POA: Diagnosis not present

## 2015-02-07 DIAGNOSIS — J690 Pneumonitis due to inhalation of food and vomit: Secondary | ICD-10-CM | POA: Diagnosis not present

## 2015-02-07 DIAGNOSIS — I69391 Dysphagia following cerebral infarction: Secondary | ICD-10-CM | POA: Diagnosis not present

## 2015-02-07 DIAGNOSIS — E119 Type 2 diabetes mellitus without complications: Secondary | ICD-10-CM | POA: Diagnosis not present

## 2015-02-07 DIAGNOSIS — F0390 Unspecified dementia without behavioral disturbance: Secondary | ICD-10-CM | POA: Diagnosis not present

## 2015-02-07 DIAGNOSIS — R1310 Dysphagia, unspecified: Secondary | ICD-10-CM | POA: Diagnosis not present

## 2015-02-12 ENCOUNTER — Encounter: Payer: Self-pay | Admitting: Cardiology

## 2015-02-12 DIAGNOSIS — R319 Hematuria, unspecified: Secondary | ICD-10-CM | POA: Diagnosis not present

## 2015-02-18 ENCOUNTER — Encounter: Payer: Self-pay | Admitting: Internal Medicine

## 2015-02-22 DIAGNOSIS — E119 Type 2 diabetes mellitus without complications: Secondary | ICD-10-CM | POA: Diagnosis not present

## 2015-02-22 DIAGNOSIS — J690 Pneumonitis due to inhalation of food and vomit: Secondary | ICD-10-CM | POA: Diagnosis not present

## 2015-02-22 DIAGNOSIS — I2510 Atherosclerotic heart disease of native coronary artery without angina pectoris: Secondary | ICD-10-CM | POA: Diagnosis not present

## 2015-02-22 DIAGNOSIS — F0390 Unspecified dementia without behavioral disturbance: Secondary | ICD-10-CM | POA: Diagnosis not present

## 2015-02-22 DIAGNOSIS — R1310 Dysphagia, unspecified: Secondary | ICD-10-CM | POA: Diagnosis not present

## 2015-02-22 DIAGNOSIS — I69391 Dysphagia following cerebral infarction: Secondary | ICD-10-CM | POA: Diagnosis not present

## 2015-03-07 ENCOUNTER — Other Ambulatory Visit: Payer: Self-pay | Admitting: Cardiology

## 2015-03-14 DIAGNOSIS — F0150 Vascular dementia without behavioral disturbance: Secondary | ICD-10-CM | POA: Diagnosis not present

## 2015-03-22 ENCOUNTER — Telehealth: Payer: Self-pay | Admitting: *Deleted

## 2015-03-22 NOTE — Telephone Encounter (Signed)
Pradaxa samples placed at the front desk for patient. 

## 2015-05-01 ENCOUNTER — Telehealth: Payer: Self-pay | Admitting: Cardiology

## 2015-05-01 ENCOUNTER — Telehealth: Payer: Self-pay | Admitting: Internal Medicine

## 2015-05-01 ENCOUNTER — Ambulatory Visit (INDEPENDENT_AMBULATORY_CARE_PROVIDER_SITE_OTHER): Payer: Medicare Other | Admitting: *Deleted

## 2015-05-01 DIAGNOSIS — I495 Sick sinus syndrome: Secondary | ICD-10-CM

## 2015-05-01 NOTE — Telephone Encounter (Signed)
Informed pt wife that his transmission was received. She verbalized understanding.

## 2015-05-01 NOTE — Progress Notes (Signed)
Remote pacemaker transmission.   

## 2015-05-01 NOTE — Telephone Encounter (Signed)
New message ° ° ° ° ° °Did you get patient's remote transmission? °

## 2015-05-01 NOTE — Telephone Encounter (Signed)
Confirmed remote transmission w/ pt wife.   

## 2015-05-03 ENCOUNTER — Other Ambulatory Visit: Payer: Self-pay | Admitting: Cardiology

## 2015-05-03 MED ORDER — ATORVASTATIN CALCIUM 40 MG PO TABS: 40.0000 mg | ORAL_TABLET | Freq: Every day | ORAL | Status: DC

## 2015-05-07 DIAGNOSIS — C4492 Squamous cell carcinoma of skin, unspecified: Secondary | ICD-10-CM | POA: Diagnosis not present

## 2015-05-07 DIAGNOSIS — L821 Other seborrheic keratosis: Secondary | ICD-10-CM | POA: Diagnosis not present

## 2015-05-10 LAB — CUP PACEART REMOTE DEVICE CHECK
Battery Impedance: 206 Ohm
Battery Remaining Longevity: 138 mo
Battery Voltage: 2.79 V
Brady Statistic RV Percent Paced: 10 %
Lead Channel Impedance Value: 402 Ohm
Lead Channel Pacing Threshold Pulse Width: 0.4 ms
Lead Channel Sensing Intrinsic Amplitude: 8 mV
Lead Channel Setting Pacing Amplitude: 2.5 V
Lead Channel Setting Sensing Sensitivity: 2.8 mV
MDC IDC MSMT LEADCHNL RA IMPEDANCE VALUE: 67 Ohm
MDC IDC MSMT LEADCHNL RV PACING THRESHOLD AMPLITUDE: 1 V
MDC IDC SESS DTM: 20160720160051
MDC IDC SET LEADCHNL RV PACING PULSEWIDTH: 0.4 ms

## 2015-05-27 ENCOUNTER — Encounter: Payer: Self-pay | Admitting: Cardiology

## 2015-06-03 ENCOUNTER — Encounter: Payer: Self-pay | Admitting: Internal Medicine

## 2015-06-11 DIAGNOSIS — I25810 Atherosclerosis of coronary artery bypass graft(s) without angina pectoris: Secondary | ICD-10-CM | POA: Diagnosis not present

## 2015-06-11 DIAGNOSIS — I739 Peripheral vascular disease, unspecified: Secondary | ICD-10-CM | POA: Diagnosis not present

## 2015-06-11 DIAGNOSIS — F324 Major depressive disorder, single episode, in partial remission: Secondary | ICD-10-CM | POA: Diagnosis not present

## 2015-06-11 DIAGNOSIS — C61 Malignant neoplasm of prostate: Secondary | ICD-10-CM | POA: Diagnosis not present

## 2015-06-11 DIAGNOSIS — N183 Chronic kidney disease, stage 3 (moderate): Secondary | ICD-10-CM | POA: Diagnosis not present

## 2015-06-11 DIAGNOSIS — I10 Essential (primary) hypertension: Secondary | ICD-10-CM | POA: Diagnosis not present

## 2015-06-11 DIAGNOSIS — E782 Mixed hyperlipidemia: Secondary | ICD-10-CM | POA: Diagnosis not present

## 2015-06-11 DIAGNOSIS — F0390 Unspecified dementia without behavioral disturbance: Secondary | ICD-10-CM | POA: Diagnosis not present

## 2015-06-11 DIAGNOSIS — R7309 Other abnormal glucose: Secondary | ICD-10-CM | POA: Diagnosis not present

## 2015-06-11 DIAGNOSIS — Z1389 Encounter for screening for other disorder: Secondary | ICD-10-CM | POA: Diagnosis not present

## 2015-06-25 ENCOUNTER — Telehealth: Payer: Self-pay | Admitting: *Deleted

## 2015-06-25 NOTE — Telephone Encounter (Signed)
Pradaxa samples placed at the front desk for patient. 

## 2015-06-26 ENCOUNTER — Ambulatory Visit (INDEPENDENT_AMBULATORY_CARE_PROVIDER_SITE_OTHER): Payer: Medicare Other | Admitting: Cardiology

## 2015-06-26 ENCOUNTER — Encounter: Payer: Self-pay | Admitting: Cardiology

## 2015-06-26 VITALS — BP 118/60 | HR 72 | Ht 69.0 in | Wt 190.0 lb

## 2015-06-26 DIAGNOSIS — I69391 Dysphagia following cerebral infarction: Secondary | ICD-10-CM | POA: Diagnosis not present

## 2015-06-26 DIAGNOSIS — I2510 Atherosclerotic heart disease of native coronary artery without angina pectoris: Secondary | ICD-10-CM | POA: Diagnosis not present

## 2015-06-26 DIAGNOSIS — I481 Persistent atrial fibrillation: Secondary | ICD-10-CM | POA: Diagnosis not present

## 2015-06-26 DIAGNOSIS — Z950 Presence of cardiac pacemaker: Secondary | ICD-10-CM

## 2015-06-26 DIAGNOSIS — I4819 Other persistent atrial fibrillation: Secondary | ICD-10-CM

## 2015-06-26 DIAGNOSIS — I495 Sick sinus syndrome: Secondary | ICD-10-CM

## 2015-06-26 NOTE — Patient Instructions (Signed)
Medication Instructions:  Your physician recommends that you continue on your current medications as directed. Please refer to the Current Medication list given to you today.  Follow-Up: Follow up in 6 months with Dr. Skains.  You will receive a letter in the mail 2 months before you are due.  Please call us when you receive this letter to schedule your follow up appointment.  Thank you for choosing [facility]!!     

## 2015-06-26 NOTE — Progress Notes (Signed)
[facility]. [address] [place], [place]  [address] Phone: [phone] Fax:  [phone]  Date:  06/26/2015   ID:  Zachary Warren, DOB 1932/11/10, MRN [id number]  PCP:  Wenda Low, MD   History of Present Illness: Zachary Warren is a 81 y.o. male with a hx of coronary artery disease status post bypass in 2003,and Atrial fibrillation cardioversion, x 3, failed amiodarone secondary to return of atrial fibrillation despite maintenance therapy, discontinuation of amiodarone. He's currently on anticoagulation with Pradaxa. He had pacemaker placed by Dr. Rayann Heman 6/12.   His pacemaker is functioning well.    He has been diagnosed with vascular dementia. 5 days a week Butch Penny , his driver, has been helping out for a few hours a day. He enjoys going to play cards.   Overall, no significant shortness of breath, no fevers, no chills, no syncope.  Wt Readings from Last 3 Encounters:  06/26/15 190 lb (86.183 kg)  12/25/14 180 lb (81.647 kg)  11/19/14 186 lb (84.369 kg)     Past Medical History  Diagnosis Date  . CVA (cerebral infarction) 1999  . DDD (degenerative disc disease), cervical   . Memory impairment   . Persistent atrial fibrillation     on pradaxa  . Coronary artery disease     s/p 5v CABG 2002  . Depression   . GERD (gastroesophageal reflux disease)   . Hyperlipidemia   . Hypertension   . PVD (peripheral vascular disease)   . 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 OSA (obstructive sleep apnea)     Recently diagnosed though pt. has not had CPAP trial  . Seizures   . Pacemaker   . Prostate cancer 2006    s/p XRT    Past Surgical History  Procedure Laterality Date  . Coronary artery bypass graft  2002  . Cea  1999  . Pacemaker insertion      implanted by JA 6/12  . Insert / replace / remove pacemaker    . Cardioversion N/A 08/21/2013    Procedure: CARDIOVERSION;  Surgeon: Candee Furbish, MD;  Location: [facility] ENDOSCOPY;  Service: Cardiovascular;   Laterality: N/A;    Current Outpatient Prescriptions  Medication Sig Dispense Refill  . atorvastatin (LIPITOR) 40 MG tablet Take 1 tablet (40 mg total) by mouth daily. 90 tablet 0  . diltiazem (CARDIZEM CD) 180 MG 24 hr capsule Take 1 capsule (180 mg total) by mouth 2 (two) times daily. 180 capsule 3  . FLUoxetine (PROZAC) 40 MG capsule Take 40 mg by mouth daily.      . Lutein 20 MG CAPS Take 1 capsule by mouth daily.     . Multiple Vitamins-Minerals (MULTIVITAMIN WITH MINERALS) tablet Take 1 tablet by mouth daily. Chewable flinstones    . omeprazole (PRILOSEC OTC) 20 MG tablet Take 20 mg by mouth 2 (two) times daily.      Marland Kitchen PRADAXA 150 MG CAPS capsule TAKE 1 CAPSULE BY MOUTH TWICE DAILY 60 capsule 11  . topiramate (TOPAMAX) 50 MG tablet Take 1 tablet (50 mg total) by mouth 2 (two) times daily. 180 tablet 4   No current facility-administered medications for this visit.    Allergies:    Allergies  Allergen Reactions  . Ramipril Other (See Comments)     cough    Social History:  The patient  reports that he quit smoking about 17 years ago. His smoking use included Cigarettes. He has  a 96 pack-year smoking history. He has never used smokeless tobacco. He reports that he does not drink alcohol or use illicit drugs.   ROS:  Please see the history of present illness.   Positive for atrial fibrillation, occasional fatigue. No change after cardioversion.  PHYSICAL EXAM: VS:  BP 118/60 mmHg  Pulse 72  Ht 5\' 9"  (1.753 m)  Wt 190 lb (86.183 kg)  BMI 28.05 kg/m2 Well nourished, well developed, in no acute distress HEENT: normal Neck: no JVD Cardiac:  Irregularly irregular,  mildly tachycardic; no murmur Lungs:  Upper airway congestion notedLeft mild crackles. Abd: soft, nontender, no hepatomegalyOverweight, improved Ext: no edema Skin: warm and dry Neuro: no focal abnormalities noted  EKG:  10/16/13-heart rate 103, atrial fibrillation     ASSESSMENT AND PLAN:  1. Atrial  fibrillation- persistent atrial fibrillation. Rate control has been somewhat challenging off of amiodarone but is adequate.   He did not feel any different after cardioversion in 11/14. Took him off of antiarrhythmic amiodarone because of persistent A. fib. Hence, we stopped his amiodarone in December of 2015.  Weight loss of over 50 pounds. Doing well. 2. Chronic anticoagulation- Pradaxa. Keeping up with lab work. 3. CAD - stable, no angina 4. Hyperlipidemia - LDL goal 70.  5. Tachycardia/bradycardia syndrome-pacemaker functioning well. 6. Obesity- doing very well. He is eating thickened liquids. Aspiration 7. Upper airway congestion/mucous production 8. We'll see him back in 6 months.  Signed,         Candee Furbish, MD [facility]  06/26/2015 3:52 PM

## 2015-07-17 DIAGNOSIS — Z23 Encounter for immunization: Secondary | ICD-10-CM | POA: Diagnosis not present

## 2015-07-25 ENCOUNTER — Encounter: Payer: Self-pay | Admitting: Diagnostic Neuroimaging

## 2015-07-25 ENCOUNTER — Ambulatory Visit (INDEPENDENT_AMBULATORY_CARE_PROVIDER_SITE_OTHER): Payer: Medicare Other | Admitting: Diagnostic Neuroimaging

## 2015-07-25 VITALS — BP 117/71 | HR 88 | Ht 69.0 in | Wt 194.0 lb

## 2015-07-25 DIAGNOSIS — F03B0 Unspecified dementia, moderate, without behavioral disturbance, psychotic disturbance, mood disturbance, and anxiety: Secondary | ICD-10-CM

## 2015-07-25 DIAGNOSIS — G40209 Localization-related (focal) (partial) symptomatic epilepsy and epileptic syndromes with complex partial seizures, not intractable, without status epilepticus: Secondary | ICD-10-CM | POA: Diagnosis not present

## 2015-07-25 DIAGNOSIS — F0390 Unspecified dementia without behavioral disturbance: Secondary | ICD-10-CM | POA: Diagnosis not present

## 2015-07-25 DIAGNOSIS — I2510 Atherosclerotic heart disease of native coronary artery without angina pectoris: Secondary | ICD-10-CM

## 2015-07-25 MED ORDER — TOPIRAMATE 50 MG PO TABS: 50.0000 mg | ORAL_TABLET | Freq: Two times a day (BID) | ORAL | Status: AC

## 2015-07-25 NOTE — Patient Instructions (Signed)
Thank you for coming to see [place] at [facility]. I hope we have been able to provide you high quality care today.  You may receive a patient satisfaction survey over the next few weeks. We would appreciate your feedback and comments so that we may continue to improve ourselves and the health of our patients.  - continue topiramate   ~~~~~~~~~~~~~~~~~~~~~~~~~~~~~~~~~~~~~~~~~~~~~~~~~~~~~~~~~~~~~~~~~  DR. Chez Bulnes'S GUIDE TO HAPPY AND HEALTHY LIVING These are some of my general health and wellness recommendations. Some of them may apply to you better than others. Please use common sense as you try these suggestions and feel free to ask me any questions.   ACTIVITY/FITNESS Mental, social, emotional and physical stimulation are very important for brain and body health. Try learning a new activity (arts, music, language, sports, games).  Keep moving your body to the best of your abilities.    NUTRITION Eat more plants: colorful vegetables, nuts, seeds and berries.  Eat less sugar, salt, preservatives and processed foods.  Avoid toxins such as cigarettes and alcohol.  Drink water when you are thirsty. Warm water with a slice of lemon is an excellent morning drink to start the day.  Consider these websites for more information The Nutrition Source (https://www.henry-hernandez.biz/) Precision Nutrition (WindowBlog.ch)   RELAXATION Consider practicing mindfulness meditation or other relaxation techniques such as deep breathing, prayer, yoga, tai chi, massage.   SLEEP Try to get at least 7-8+ hours sleep per day. Regular exercise and reduced caffeine will help you sleep better. Practice good sleep hygeine techniques. See website sleep.org for more information.   PLANNING Prepare estate planning, living will, healthcare POA documents. Sometimes this is best planned with the help of an attorney. Theconversationproject.org and  agingwithdignity.org are excellent resources.

## 2015-07-25 NOTE — Progress Notes (Signed)
[facility]  PATIENT: Zachary Warren DOB: [birth year]  REFERRING CLINICIAN:  HISTORY FROM: patient and wife and aid REASON FOR VISIT: follow up    HISTORICAL  CHIEF COMPLAINT:  Chief Complaint  Patient presents with  . Epilepsy    rm 7, wife- [name], cousin/caregiver - Butch Penny  . Follow-up    1 year  . Memory Loss    MMSE 12    HISTORY OF PRESENT ILLNESS:   UPDATE 07/25/15 (VRP): Since last visit, doing well. Memory loss slightly progressing. Has an aid to help with ADLS and activities. No seizures.   UPDATE 07/24/14 (LL): Mr. Symonette returns to the office for yearly followup. His wife reports no seizures in the last year. He states that his tremor is stable and does not think it is any worse. He recently was treated for pneumonia and wife wondered if it was aspiration pneumonia, asked for swallowing study which they had completed today prior to coming to the office. He reportedly had some difficulty with thin liquids and was recommended to use a thickener to create nectar-thick liquids and avoid straws. He was recommended for speech therapy which he will be attending and neuro rehabilitation. Wife states that she sees a slow progressive decline in his memory and functioning. She is recently joined an Alzheimer support group to that meets at [facility]. After last visit he tried donepezil for 3 days and had severe diarrhea and nausea so they stopped it.Dr Deforest Hoyles had trialed him on Namenda as well, he finished the titration pack in one month of the 20 mg XR dose. His wife thought he was more dizzy and confused while taking it, having to hold onto furniture to walk, so they discontinued it.   UPDATE 05/25/13 (LL): Mr. Baisley comes in with his wife for yearly follow up. No falls at home in last year and no hospitalizations. Tremor well-controlled but has resting tremor in left hand now. Sleeps most of the day in recliner but states he can't sleep well at night. States  he has to urinate average 6 times per night; uses urinal. Wife finds him more confused in the last 6 months. For example, asks for objects by the wrong name, put sour cream on his cantaloupe instead of the potato. She says he often acts like he is looking for something but when questioned, can't tell you what it is. Wants baseline memory testing.   UPDATE 04/19/12 (VRP): No new seizures. Had fever 104 and admitted to [facility] in June 2013. Also, fell out of bed on 04/16/12 with left supraorbital laceration. No LOC. Tremor well controlled.   UPDATE 08/11/11: Pacemaker placed in June. On Pradaxa and Metoprolol. Pt has not had any seizure activity and tremor is intermittent. Denies side effects to Topamax. No new neurologic complaints.  PRIOR HPI: 81 year old white male returns today for followup. He was last seen 12/12/10. He has a history of benign essential tremor, history of a CVA in May of 1999, associated with mixed aphasia and right hemiparesis. His tremor developed in 2004. He developed seizure events in August of 2009. EEG was normal in the awake state. MRI showed nothing acute. He is currently on Topamax for both his tremor and his seizure activity tolerating without side effects and no further seizure activity. He gets no regular exercise, weight has remained stable. No falls, no assistive device, continues to complain with extreme fatigue. Wife says he can sleep in the chair during the day and then sleep 6  to 8 hrs at night. Denies early morning headache. He does not feel refreshed in the morning. She does not know if he snores or has periods of apnea as they sleep in separate rooms. He also has a history of depression and is on meds. Recent swallowing study by PCP with out abnormalities. New dx of GERD and hiatal hernia.    REVIEW OF SYSTEMS: Full 14 system review of systems performed and notable only for memory loss confusion walking diff trouble swallowing unexpected wt change.     ALLERGIES: Allergies  Allergen Reactions  . Aricept [Donepezil Hcl] Other (See Comments)    "stomach pain", generic  . Namenda [Memantine Hcl] Other (See Comments)    "dizzy", brand medication  . Ramipril Other (See Comments)     cough    HOME MEDICATIONS: Outpatient Prescriptions Prior to Visit  Medication Sig Dispense Refill  . atorvastatin (LIPITOR) 40 MG tablet Take 1 tablet (40 mg total) by mouth daily. 90 tablet 0  . diltiazem (CARDIZEM CD) 180 MG 24 hr capsule Take 1 capsule (180 mg total) by mouth 2 (two) times daily. 180 capsule 3  . FLUoxetine (PROZAC) 40 MG capsule Take 40 mg by mouth daily.      . Lutein 20 MG CAPS Take 1 capsule by mouth daily.     . Multiple Vitamins-Minerals (MULTIVITAMIN WITH MINERALS) tablet Take 1 tablet by mouth daily. Chewable flinstones    . omeprazole (PRILOSEC OTC) 20 MG tablet Take 20 mg by mouth 2 (two) times daily.      Marland Kitchen PRADAXA 150 MG CAPS capsule TAKE 1 CAPSULE BY MOUTH TWICE DAILY 60 capsule 11  . topiramate (TOPAMAX) 50 MG tablet Take 1 tablet (50 mg total) by mouth 2 (two) times daily. 180 tablet 4   No facility-administered medications prior to visit.    PAST MEDICAL HISTORY: Past Medical History  Diagnosis Date  . CVA (cerebral infarction) 1999  . DDD (degenerative disc disease), cervical   . Memory impairment   . Persistent atrial fibrillation ([place])     on pradaxa  . Coronary artery disease     s/p 5v CABG 2002  . Depression   . GERD (gastroesophageal reflux disease)   . Hyperlipidemia   . Hypertension   . PVD (peripheral vascular disease) ([place])   . Tachycardia-bradycardia [facility])     s/p PPM by JA 6/12  . OSA (obstructive sleep apnea)     Recently diagnosed though pt. has not had CPAP trial  . Seizures ([place])   . Pacemaker   . Prostate cancer ([place]) 2006    s/p XRT    PAST SURGICAL HISTORY: Past Surgical History  Procedure Laterality Date  . Coronary artery bypass graft  2002  . Cea  1999  . Pacemaker  insertion      implanted by JA 6/12  . Insert / replace / remove pacemaker    . Cardioversion N/A 08/21/2013    Procedure: CARDIOVERSION;  Surgeon: Candee Furbish, MD;  Location: [facility] ENDOSCOPY;  Service: Cardiovascular;  Laterality: N/A;    FAMILY HISTORY: Family History  Problem Relation Age of Onset  . Ovarian cancer    . Pancreatic cancer    . Lung cancer    . Cancer Mother     ovarian  . Ovarian cancer Mother   . Cancer Father     pancreatic  . Pancreatic cancer Father   . Cancer Brother     lung    SOCIAL HISTORY:  Social  History   Social History  . Marital Status: Married    Spouse Name: [name]  . Number of Children: 3  . Years of Education: N/A   Occupational History  . [employer]   Social History Main Topics  . Smoking status: Former Smoker -- 2.00 packs/day for 48 years    Types: Cigarettes    Quit date: 03/09/1998  . Smokeless tobacco: Never Used  . Alcohol Use: No     Comment: quit: 1999  . Drug Use: No  . Sexual Activity: No   Other Topics Concern  . Not on file   Social History Narrative   Pt lives in [place] with spouse.  Retired.  Prior Biochemist, clinical for [employer].   Caffeine Use: once daily   Married 20 years     PHYSICAL EXAM  GENERAL EXAM/CONSTITUTIONAL: Vitals:  Filed Vitals:   07/25/15 1247  BP: 117/71  Pulse: 88  Height: 5\' 9"  (1.753 m)  Weight: 194 lb (87.998 kg)     Body mass index is 28.64 kg/(m^2).  No exam data present   Generalized: In no acute distress, pleasant elderly male.  Neck: Supple, no carotid bruits  Cardiac: Regular rate rhythm, no murmur   Neurologic Exam  Mental Status: Awake, alert. MILD EXP APHASIA. DECR FLUENCY.  Cranial Nerves: Pupils are equal and reactive to light. Visual fields are full to confrontation. Conjugate eye movements are full and symmetric. Facial sensation and strength are symmetric. Hearing is intact. Palate elevated symmetrically and uvula is midline.  Shoulder shrug is symmetric. Tongue is midline.  Motor: POSTURAL AND ACTION TREMOR OF BUE. MILD TREMOR OF CHIN. RESTING TREMOR OF LEFT HAND WITH PILL-ROLLING QUALITY. MILD RIGHT HEMIPARESIS (ARM). SLOW FINGER TAPS. SLOW TOE TAPS.  Sensory: Intact and symmetric to light touch.  Coordination: No ataxia or dysmetria on finger-nose or rapid alternating movement testing.  Gait and Station: SLOW WIDE-based gait. TURNS WITH SMALL STEPS.  Reflexes: Deep tendon reflexes in the upper and lower extremity are present and symmetric.    NEUROLOGIC: MENTAL STATUS:  MMSE - Mini Mental State Exam 07/25/2015 07/24/2014  Orientation to time 4 5  Orientation to Place 2 3  Registration 3 3  Attention/ Calculation 0 5  Recall 0 0  Language- name 2 objects 2 2  Language- repeat 0 1  Language- follow 3 step command 0 3  Language- read & follow direction 1 1  Write a sentence 0 1  Copy design 0 1  Total score 12 25       DIAGNOSTIC DATA (LABS, IMAGING, TESTING) - I reviewed patient records, labs, notes, testing and imaging myself where available.  Lab Results  Component Value Date   WBC 12.3* 10/25/2014   HGB 11.3* 10/25/2014   HCT 36.5* 10/25/2014   MCV 93.1 10/25/2014   PLT 234 10/25/2014      Component Value Date/Time   NA 142 10/25/2014 0542   K 3.0* 10/25/2014 0542   CL 111 10/25/2014 0542   CO2 24 10/25/2014 0542   GLUCOSE 106* 10/25/2014 0542   BUN 12 10/25/2014 0542   CREATININE 0.93 10/25/2014 0542   CALCIUM 8.3* 10/25/2014 0542   PROT 6.4 10/23/2014 2256   ALBUMIN 3.3* 10/23/2014 2256   AST 13 10/23/2014 2256   ALT 11 10/23/2014 2256   ALKPHOS 75 10/23/2014 2256   BILITOT 0.8 10/23/2014 2256   GFRNONAA 78* 10/25/2014 0542   GFRAA >90 10/25/2014 0542   Lab  Results  Component Value Date   CHOL 137 12/25/2013   HDL 57.90 12/25/2013   LDLCALC 64 12/25/2013   TRIG 74.0 12/25/2013   CHOLHDL 2 12/25/2013   No results found for: HGBA1C No results found for:  VITAMINB12 Lab Results  Component Value Date   TSH 1.46 07/26/2013    10/10/14 CT head [I reviewed images myself and agree with interpretation. -VRP]  - No acute intracranial abnormality    ASSESSMENT AND PLAN  81 y.o. year old male here with history of left brain stroke, right hemiparesis, partial and generalized seizures, and essential tremor. No seizure since 2009. Increased confusion and short-term memory loss since 2014, likely Alzheimer's or mixed vascular dementia. He has not tolerated Aricept or Namenda.    PLAN:  1. continue TPX 50mg  BID for seizure prevention; will ask PCP to refill since patient is stable 2. continue stroke prevention (pradaxa, BP control, statin) 3. safety and supervision issues reviewed with patient and family  Meds ordered this encounter  Medications  . topiramate (TOPAMAX) 50 MG tablet    Sig: Take 1 tablet (50 mg total) by mouth 2 (two) times daily.    Dispense:  180 tablet    Refill:  4   Return if symptoms worsen or fail to improve, for return to PCP.    Penni Bombard, MD 65/79/0383, 3:38 PM Certified in Neurology, Neurophysiology and Neuroimaging  [facility] [address] [place], [place] [address] [phone]

## 2015-07-29 ENCOUNTER — Encounter: Payer: Medicare Other | Admitting: Internal Medicine

## 2015-08-05 ENCOUNTER — Encounter: Payer: Medicare Other | Admitting: Nurse Practitioner

## 2015-08-05 NOTE — Progress Notes (Signed)
This encounter was created in error - please disregard.

## 2015-08-19 ENCOUNTER — Other Ambulatory Visit: Payer: Self-pay | Admitting: Cardiology

## 2015-08-22 ENCOUNTER — Telehealth: Payer: Self-pay | Admitting: Cardiology

## 2015-08-22 NOTE — Telephone Encounter (Signed)
New message    Patient calling the office for samples of medication:   1.  What medication and dosage are you requesting samples for? pradaxa 150 mg   2.  Are you currently out of this medication? Yes

## 2015-08-22 NOTE — Telephone Encounter (Signed)
Provided 4 boxes of Pradaxa 150 for patient.

## 2015-09-08 NOTE — Progress Notes (Signed)
Electrophysiology Office Note Date: 09/09/2015  ID:  Zachary Warren, DOB May 05, 1933, MRN [id number]  PCP: Wenda Low, MD Primary Cardiologist: Marlou Porch Electrophysiologist: Allred  CC: Pacemaker follow-up  Zachary Warren is a 81 y.o. male seen today for Dr Rayann Heman.  He presents today for routine electrophysiology followup. Since last being seen in our clinic, the patient reports doing reasonably well.  He is having problems with aspiration and is using nectar thick liquids.  He and his wife have noticed some hematuria but denies chest pain, palpitations, dyspnea, PND, orthopnea, nausea, vomiting, dizziness, syncope, edema, weight gain, or early satiety.  Device History: MDT dual chamber PPM implanted 2012 for tachy/brady   Past Medical History  Diagnosis Date  . CVA (cerebral infarction) 1999  . DDD (degenerative disc disease), cervical   . Memory impairment   . Persistent atrial fibrillation ([place])     on pradaxa  . Coronary artery disease     s/p 5v CABG 2002  . Depression   . GERD (gastroesophageal reflux disease)   . Hyperlipidemia   . Hypertension   . PVD (peripheral vascular disease) ([place])   . Tachycardia-bradycardia [facility])     s/p PPM by JA 6/12  . OSA (obstructive sleep apnea)     Recently diagnosed though pt. has not had CPAP trial  . Seizures ([place])   . Pacemaker   . Prostate cancer [facility]) 2006    s/p XRT   Past Surgical History  Procedure Laterality Date  . Coronary artery bypass graft  2002  . Cea  1999  . Pacemaker insertion      implanted by JA 6/12  . Insert / replace / remove pacemaker    . Cardioversion N/A 08/21/2013    Procedure: CARDIOVERSION;  Surgeon: Candee Furbish, MD;  Location: [facility] ENDOSCOPY;  Service: Cardiovascular;  Laterality: N/A;    Current Outpatient Prescriptions  Medication Sig Dispense Refill  . atorvastatin (LIPITOR) 40 MG tablet TAKE 1 TABLET (40 MG TOTAL) BY MOUTH DAILY. 90 tablet 2  . diltiazem (CARDIZEM CD) 180 MG 24 hr  capsule Take 1 capsule (180 mg total) by mouth 2 (two) times daily. 180 capsule 3  . FLUoxetine (PROZAC) 40 MG capsule Take 40 mg by mouth daily.      . Lutein 20 MG CAPS Take 1 capsule by mouth daily.     . Multiple Vitamins-Minerals (MULTIVITAMIN WITH MINERALS) tablet Take 1 tablet by mouth daily. Chewable flinstones    . omeprazole (PRILOSEC OTC) 20 MG tablet Take 20 mg by mouth 2 (two) times daily.      Marland Kitchen PRADAXA 150 MG CAPS capsule TAKE 1 CAPSULE BY MOUTH TWICE DAILY 60 capsule 11  . topiramate (TOPAMAX) 50 MG tablet Take 1 tablet (50 mg total) by mouth 2 (two) times daily. 180 tablet 4   No current facility-administered medications for this visit.    Allergies:   Aricept; Namenda; and Ramipril   Social History: Social History   Social History  . Marital Status: Married    Spouse Name: Zachary Warren  . Number of Children: 3  . Years of Education: N/A   Occupational History  . [employer]   Social History Main Topics  . Smoking status: Former Smoker -- 2.00 packs/day for 48 years    Types: Cigarettes    Quit date: 03/09/1998  . Smokeless tobacco: Never Used  . Alcohol Use: No     Comment: quit: 1999  . Drug Use:  No  . Sexual Activity: No   Other Topics Concern  . Not on file   Social History Narrative   Pt lives in [place] with spouse.  Retired.  Prior Biochemist, clinical for [employer].   Caffeine Use: once daily   Married 20 years    Family History: Family History  Problem Relation Age of Onset  . Ovarian cancer    . Pancreatic cancer    . Lung cancer    . Cancer Mother     ovarian  . Ovarian cancer Mother   . Cancer Father     pancreatic  . Pancreatic cancer Father   . Cancer Brother     lung     Review of Systems: All other systems reviewed and are otherwise negative except as noted above.   Physical Exam: VS:  BP 114/62 mmHg  Pulse 78  Ht 5\' 9"  (1.753 m)  Wt 194 lb (87.998 kg)  BMI 28.64 kg/m2 , BMI Body mass index is 28.64  kg/(m^2).  GEN- The patient is elderly appearing    HEENT: normocephalic, atraumatic; sclera clear, conjunctiva pink; hearing intact; oropharynx clear; neck supple  Lungs- Clear to ausculation bilaterally, normal work of breathing.  Scattered rhonchi Heart- Irregular rate and rhythm  GI- soft, non-tender, non-distended, bowel sounds present  Extremities- no clubbing, cyanosis, or edema; DP/PT/radial pulses 2+ bilaterally MS- no significant deformity or atrophy Skin- warm and dry, no rash or lesion; PPM pocket well healed Psych- euthymic mood, full affect Neuro- strength and sensation are intact  PPM Interrogation- reviewed in detail today,  See PACEART report  EKG:  EKG is not ordered today.  Recent Labs: 10/23/2014: ALT 11; Magnesium 1.9 10/25/2014: BUN 12; Creatinine, Ser 0.93; Hemoglobin 11.3*; Platelets 234; Potassium 3.0*; Sodium 142   Wt Readings from Last 3 Encounters:  09/09/15 194 lb (87.998 kg)  07/25/15 194 lb (87.998 kg)  06/26/15 190 lb (86.183 kg)     Other studies Reviewed: Additional studies/ records that were reviewed today include: Dr Marlou Porch and Allred's office notes  Assessment and Plan:  1.  Permanent atrial fibrillation Normal PPM function See Pace Art report V rates remain elevated, blood pressure is soft today. Will not make medication changes at this time. Continue Pradaxa for CHADS2VASC of at least 6 - BMET today for CrCl  2.  Chronic diastolic heart failure Euvolemic on exam  3.  HTN Stable No change required today   Current medicines are reviewed at length with the patient today.   The patient does not have concerns regarding his medicines.  The following changes were made today:  none  Labs/ tests ordered today include: BMET, CBC   Disposition:   Follow up with [facility] transmissions, me in 1 year, Dr Marlou Porch as scheduled   Signed, Chanetta Marshall, NP 09/09/2015 10:42 AM  [place]  [place] [address] [phone] (office) [phone] (fax)

## 2015-09-09 ENCOUNTER — Encounter: Payer: Self-pay | Admitting: Nurse Practitioner

## 2015-09-09 ENCOUNTER — Encounter: Payer: Self-pay | Admitting: Internal Medicine

## 2015-09-09 ENCOUNTER — Ambulatory Visit (INDEPENDENT_AMBULATORY_CARE_PROVIDER_SITE_OTHER): Payer: Medicare Other | Admitting: Nurse Practitioner

## 2015-09-09 VITALS — BP 114/62 | HR 78 | Ht 69.0 in | Wt 194.0 lb

## 2015-09-09 DIAGNOSIS — I10 Essential (primary) hypertension: Secondary | ICD-10-CM | POA: Diagnosis not present

## 2015-09-09 DIAGNOSIS — I5032 Chronic diastolic (congestive) heart failure: Secondary | ICD-10-CM

## 2015-09-09 DIAGNOSIS — I482 Chronic atrial fibrillation: Secondary | ICD-10-CM | POA: Diagnosis not present

## 2015-09-09 DIAGNOSIS — I2510 Atherosclerotic heart disease of native coronary artery without angina pectoris: Secondary | ICD-10-CM | POA: Diagnosis not present

## 2015-09-09 DIAGNOSIS — I4891 Unspecified atrial fibrillation: Secondary | ICD-10-CM | POA: Diagnosis not present

## 2015-09-09 DIAGNOSIS — I4821 Permanent atrial fibrillation: Secondary | ICD-10-CM

## 2015-09-09 LAB — CUP PACEART INCLINIC DEVICE CHECK
Date Time Interrogation Session: 20161128141227
Implantable Lead Implant Date: 20120609
Implantable Lead Implant Date: 20120609
Implantable Lead Location: 753860
Implantable Lead Model: 5076
Implantable Lead Model: 5076
Lead Channel Impedance Value: 421 Ohm
Lead Channel Pacing Threshold Pulse Width: 0.4 ms
Lead Channel Setting Pacing Amplitude: 2.5 V
Lead Channel Setting Sensing Sensitivity: 2.8 mV
MDC IDC LEAD LOCATION: 753859
MDC IDC MSMT BATTERY VOLTAGE: 2.79 V
MDC IDC MSMT LEADCHNL RV PACING THRESHOLD AMPLITUDE: 0.875 V
MDC IDC MSMT LEADCHNL RV SENSING INTR AMPL: 22.4 mV
MDC IDC SET LEADCHNL RV PACING PULSEWIDTH: 0.4 ms

## 2015-09-09 LAB — BASIC METABOLIC PANEL
BUN: 17 mg/dL (ref 7–25)
CO2: 27 mmol/L (ref 20–31)
Calcium: 8.9 mg/dL (ref 8.6–10.3)
Chloride: 111 mmol/L — ABNORMAL HIGH (ref 98–110)
Creat: 1.01 mg/dL (ref 0.70–1.11)
GLUCOSE: 86 mg/dL (ref 65–99)
POTASSIUM: 3.7 mmol/L (ref 3.5–5.3)
Sodium: 146 mmol/L (ref 135–146)

## 2015-09-09 LAB — CBC
HEMATOCRIT: 41.7 % (ref 39.0–52.0)
Hemoglobin: 13.6 g/dL (ref 13.0–17.0)
MCH: 29.1 pg (ref 26.0–34.0)
MCHC: 32.6 g/dL (ref 30.0–36.0)
MCV: 89.3 fL (ref 78.0–100.0)
MPV: 10.5 fL (ref 8.6–12.4)
Platelets: 209 10*3/uL (ref 150–400)
RBC: 4.67 MIL/uL (ref 4.22–5.81)
RDW: 14.7 % (ref 11.5–15.5)
WBC: 7.3 10*3/uL (ref 4.0–10.5)

## 2015-09-09 NOTE — Patient Instructions (Signed)
Medication Instructions:    CONTINUE SAME MEDICATIONS    If you need a refill on your cardiac medications before your next appointment, please call your pharmacy.  Labwork:  BMET AND CBC    Testing/Procedures: NONE ORDER TODAY    Follow-Up:    Remote monitoring is used to monitor your Pacemaker of ICD from home. This monitoring reduces the number of office visits required to check your device to one time per year. It allows [place] to keep an eye on the functioning of your device to ensure it is working properly. You are scheduled for a device check from home on .2 /28 /17 You may send your transmission at any time that day. If you have a wireless device, the transmission will be sent automatically. After your physician reviews your transmission, you will receive a postcard with your next transmission date.  Your physician wants you to follow-up in: [place] will receive a reminder letter in the mail two months in advance. If you don't receive a letter, please call our office to schedule the follow-up appointment.     Any Other Special Instructions Will Be Listed Below (If Applicable).

## 2015-09-17 ENCOUNTER — Other Ambulatory Visit: Payer: Self-pay | Admitting: Internal Medicine

## 2015-10-15 DIAGNOSIS — F324 Major depressive disorder, single episode, in partial remission: Secondary | ICD-10-CM | POA: Diagnosis not present

## 2015-10-15 DIAGNOSIS — E782 Mixed hyperlipidemia: Secondary | ICD-10-CM | POA: Diagnosis not present

## 2015-10-15 DIAGNOSIS — N183 Chronic kidney disease, stage 3 (moderate): Secondary | ICD-10-CM | POA: Diagnosis not present

## 2015-10-15 DIAGNOSIS — I5032 Chronic diastolic (congestive) heart failure: Secondary | ICD-10-CM | POA: Diagnosis not present

## 2015-10-15 DIAGNOSIS — I739 Peripheral vascular disease, unspecified: Secondary | ICD-10-CM | POA: Diagnosis not present

## 2015-10-15 DIAGNOSIS — I482 Chronic atrial fibrillation: Secondary | ICD-10-CM | POA: Diagnosis not present

## 2015-10-15 DIAGNOSIS — K219 Gastro-esophageal reflux disease without esophagitis: Secondary | ICD-10-CM | POA: Diagnosis not present

## 2015-10-15 DIAGNOSIS — I10 Essential (primary) hypertension: Secondary | ICD-10-CM | POA: Diagnosis not present

## 2015-10-15 DIAGNOSIS — F0390 Unspecified dementia without behavioral disturbance: Secondary | ICD-10-CM | POA: Diagnosis not present

## 2015-10-15 DIAGNOSIS — M6281 Muscle weakness (generalized): Secondary | ICD-10-CM | POA: Diagnosis not present

## 2015-11-04 ENCOUNTER — Telehealth: Payer: Self-pay | Admitting: Cardiology

## 2015-11-04 NOTE — Telephone Encounter (Signed)
New Message  Patient calling the office for samples of medication:   1.  What medication and dosage are you requesting samples for? Pradaxa   2.  Are you currently out of this medication? Yes

## 2015-11-04 NOTE — Telephone Encounter (Signed)
Called pt and spoke with pt's wife [name], informing her that the samples of Pradaxa 150 mg tablet were for pts that was just starting the medication and that the pt could apply for assistance. Wife stated that she would like to apply. I informed the wife that I would be leaving a application and two weeks supply of pradaxa 150 mg samples up at the front desk and if the pt could return the application as soon as possible to be process. I advised the wife that if the pt has any other problems, questions or concerns to call the office. Pt verbalized understanding.

## 2015-12-09 ENCOUNTER — Ambulatory Visit (INDEPENDENT_AMBULATORY_CARE_PROVIDER_SITE_OTHER): Payer: Medicare Other | Admitting: *Deleted

## 2015-12-09 ENCOUNTER — Telehealth: Payer: Self-pay | Admitting: Cardiology

## 2015-12-09 DIAGNOSIS — I495 Sick sinus syndrome: Secondary | ICD-10-CM | POA: Diagnosis not present

## 2015-12-09 LAB — CUP PACEART REMOTE DEVICE CHECK
Brady Statistic RV Percent Paced: 21 %
Implantable Lead Implant Date: 20120609
Implantable Lead Location: 753859
Implantable Lead Location: 753860
Implantable Lead Model: 5076
Implantable Lead Model: 5076
Lead Channel Impedance Value: 416 Ohm
Lead Channel Pacing Threshold Amplitude: 0.75 V
Lead Channel Sensing Intrinsic Amplitude: 8 mV
MDC IDC LEAD IMPLANT DT: 20120609
MDC IDC MSMT BATTERY IMPEDANCE: 254 Ohm
MDC IDC MSMT BATTERY REMAINING LONGEVITY: 128 mo
MDC IDC MSMT BATTERY VOLTAGE: 2.79 V
MDC IDC MSMT LEADCHNL RA IMPEDANCE VALUE: 67 Ohm
MDC IDC MSMT LEADCHNL RV PACING THRESHOLD PULSEWIDTH: 0.4 ms
MDC IDC SESS DTM: 20170227183450
MDC IDC SET LEADCHNL RV PACING AMPLITUDE: 2.5 V
MDC IDC SET LEADCHNL RV PACING PULSEWIDTH: 0.4 ms
MDC IDC SET LEADCHNL RV SENSING SENSITIVITY: 2.8 mV

## 2015-12-09 NOTE — Telephone Encounter (Signed)
Confirmed remote transmission w/ pt wife.   

## 2015-12-10 NOTE — Progress Notes (Signed)
Remote pacemaker transmission.   

## 2015-12-12 ENCOUNTER — Other Ambulatory Visit: Payer: Self-pay | Admitting: Internal Medicine

## 2015-12-25 DIAGNOSIS — H6120 Impacted cerumen, unspecified ear: Secondary | ICD-10-CM | POA: Diagnosis not present

## 2015-12-25 DIAGNOSIS — H9209 Otalgia, unspecified ear: Secondary | ICD-10-CM | POA: Diagnosis not present

## 2015-12-27 ENCOUNTER — Encounter: Payer: Self-pay | Admitting: Cardiology

## 2015-12-30 ENCOUNTER — Ambulatory Visit: Payer: Medicare Other | Admitting: Cardiology

## 2016-01-01 ENCOUNTER — Ambulatory Visit (INDEPENDENT_AMBULATORY_CARE_PROVIDER_SITE_OTHER): Payer: Medicare Other | Admitting: Cardiology

## 2016-01-01 ENCOUNTER — Ambulatory Visit: Payer: Medicare Other | Admitting: Cardiology

## 2016-01-01 ENCOUNTER — Encounter: Payer: Self-pay | Admitting: Cardiology

## 2016-01-01 VITALS — BP 126/62 | HR 84 | Ht 68.0 in | Wt 194.0 lb

## 2016-01-01 DIAGNOSIS — I4819 Other persistent atrial fibrillation: Secondary | ICD-10-CM

## 2016-01-01 DIAGNOSIS — E785 Hyperlipidemia, unspecified: Secondary | ICD-10-CM

## 2016-01-01 DIAGNOSIS — I2510 Atherosclerotic heart disease of native coronary artery without angina pectoris: Secondary | ICD-10-CM | POA: Diagnosis not present

## 2016-01-01 DIAGNOSIS — I481 Persistent atrial fibrillation: Secondary | ICD-10-CM

## 2016-01-01 NOTE — Progress Notes (Signed)
Zachary Warren. [address], [place]  [address] Phone: [phone] Fax:  [phone]  Date:  01/01/2016   ID:  Zachary Warren, DOB 1933/01/21, MRN [id number]  PCP:  Wenda Low, MD   History of Present Illness: Zachary Warren is a 82 y.o. male with a hx of coronary artery disease status post bypass in 2003,and Atrial fibrillation cardioversion, x 3, failed amiodarone secondary to return of atrial fibrillation despite maintenance therapy, discontinuation of amiodarone. He's currently on anticoagulation with Pradaxa. He had pacemaker placed by Dr. Rayann Heman 6/12.   His pacemaker is functioning well.   He has been diagnosed with vascular dementia. 5 days a week Butch Penny , his driver, has been helping out for a few hours a day. He enjoys going to play cards.  Vascular dementia. Wife goes to [facility] support group. Wife is an Training and development officer. Used to be a Chartered certified accountant.   Had minor hematuria, transient.   Overall, no significant shortness of breath, no fevers, no chills, no syncope.  Wt Readings from Last 3 Encounters:  01/01/16 194 lb (87.998 kg)  09/09/15 194 lb (87.998 kg)  07/25/15 194 lb (87.998 kg)     Past Medical History  Diagnosis Date  . CVA (cerebral infarction) 1999  . DDD (degenerative disc disease), cervical   . Memory impairment   . Persistent atrial fibrillation ([place])     on pradaxa  . Coronary artery disease     s/p 5v CABG 2002  . Depression   . GERD (gastroesophageal reflux disease)   . Hyperlipidemia   . Hypertension   . PVD (peripheral vascular disease) ([place])   . Tachycardia-bradycardia [facility])     s/p PPM by JA 6/12  . OSA (obstructive sleep apnea)     Recently diagnosed though pt. has not had CPAP trial  . Seizures ([place])   . Pacemaker   . Prostate cancer [facility]) 2006    s/p XRT    Past Surgical History  Procedure Laterality Date  . Coronary artery bypass graft  2002  . Cea  1999  . Pacemaker insertion      implanted by JA 6/12  . Insert /  replace / remove pacemaker    . Cardioversion N/A 08/21/2013    Procedure: CARDIOVERSION;  Surgeon: Candee Furbish, MD;  Location: [facility] ENDOSCOPY;  Service: Cardiovascular;  Laterality: N/A;    Current Outpatient Prescriptions  Medication Sig Dispense Refill  . atorvastatin (LIPITOR) 40 MG tablet TAKE 1 TABLET (40 MG TOTAL) BY MOUTH DAILY. 90 tablet 2  . diltiazem (CARDIZEM CD) 180 MG 24 hr capsule TAKE 1 CAPSULE BY MOUTH TWICE DAILY 180 capsule 2  . FLUoxetine (PROZAC) 40 MG capsule Take 40 mg by mouth daily.      . Lutein 20 MG CAPS Take 1 capsule by mouth daily.     . Multiple Vitamins-Minerals (MULTIVITAMIN WITH MINERALS) tablet Take 1 tablet by mouth daily. Chewable flinstones    . omeprazole (PRILOSEC OTC) 20 MG tablet Take 20 mg by mouth 2 (two) times daily.      Marland Kitchen PRADAXA 150 MG CAPS capsule TAKE 1 CAPSULE BY MOUTH TWICE DAILY 60 capsule 11  . topiramate (TOPAMAX) 50 MG tablet Take 1 tablet (50 mg total) by mouth 2 (two) times daily. 180 tablet 4   No current facility-administered medications for this visit.    Allergies:    Allergies  Allergen Reactions  . Aricept [Donepezil Hcl] Other (See Comments)    "stomach  pain", generic  . Namenda [Memantine Hcl] Other (See Comments)    "dizzy", brand medication  . Ramipril Other (See Comments)     cough    Social History:  The patient  reports that he quit smoking about 17 years ago. His smoking use included Cigarettes. He has a 96 pack-year smoking history. He has never used smokeless tobacco. He reports that he does not drink alcohol or use illicit drugs.   ROS:  Please see the history of present illness.   Positive for atrial fibrillation, occasional fatigue. No change after cardioversion.  PHYSICAL EXAM: VS:  BP 126/62 mmHg  Pulse 84  Ht 5\' 8"  (1.727 m)  Wt 194 lb (87.998 kg)  BMI 29.50 kg/m2 Well nourished, well developed, in no acute distress HEENT: normal Neck: no JVD Cardiac:  Irregularly irregular,  mildly tachycardic;  no murmur Lungs:  Upper airway congestion notedLeft mild crackles. Abd: soft, nontender, no hepatomegalyOverweight, improved Ext: no edema Skin: warm and dry Neuro: no focal abnormalities noted  EKG:  10/16/13-heart rate 103, atrial fibrillation     ASSESSMENT AND PLAN:  1. Atrial fibrillation- persistent atrial fibrillation. Rate control has been somewhat challenging off of amiodarone but is adequate.   He did not feel any different after cardioversion in 11/14. Took him off of antiarrhythmic amiodarone because of persistent A. fib. Hence, we stopped his amiodarone in December of 2015.  Weight loss of over 50 pounds. Doing well. 2. Chronic anticoagulation- Pradaxa. Keeping up with lab work. 3. CAD - stable, no angina 4. Hyperlipidemia - LDL goal 70.  5. Tachycardia/bradycardia syndrome-pacemaker functioning well. 6. Obesity- doing very well. He was eating thickened liquids. Prior Aspiration. 7. Upper airway congestion/mucous production. Aspiration precautions 8. We'll see him back in 6 months.  Signed,         Candee Furbish, MD [facility]  01/01/2016 2:26 PM

## 2016-01-01 NOTE — Patient Instructions (Signed)
Your physician recommends that you continue on your current medications as directed. Please refer to the Current Medication list given to you today.  Your physician wants you to follow-up in: 6 months with Dr. Skains. You will receive a reminder letter in the mail two months in advance. If you don't receive a letter, please call our office to schedule the follow-up appointment.  

## 2016-01-02 DIAGNOSIS — H9190 Unspecified hearing loss, unspecified ear: Secondary | ICD-10-CM | POA: Diagnosis not present

## 2016-01-02 DIAGNOSIS — H6120 Impacted cerumen, unspecified ear: Secondary | ICD-10-CM | POA: Diagnosis not present

## 2016-02-12 ENCOUNTER — Other Ambulatory Visit: Payer: Self-pay | Admitting: Dermatology

## 2016-02-12 DIAGNOSIS — D0461 Carcinoma in situ of skin of right upper limb, including shoulder: Secondary | ICD-10-CM | POA: Diagnosis not present

## 2016-02-12 DIAGNOSIS — L820 Inflamed seborrheic keratosis: Secondary | ICD-10-CM | POA: Diagnosis not present

## 2016-02-12 DIAGNOSIS — D485 Neoplasm of uncertain behavior of skin: Secondary | ICD-10-CM | POA: Diagnosis not present

## 2016-02-13 DIAGNOSIS — I5032 Chronic diastolic (congestive) heart failure: Secondary | ICD-10-CM | POA: Diagnosis not present

## 2016-02-13 DIAGNOSIS — Z0000 Encounter for general adult medical examination without abnormal findings: Secondary | ICD-10-CM | POA: Diagnosis not present

## 2016-02-13 DIAGNOSIS — R7309 Other abnormal glucose: Secondary | ICD-10-CM | POA: Diagnosis not present

## 2016-02-13 DIAGNOSIS — F324 Major depressive disorder, single episode, in partial remission: Secondary | ICD-10-CM | POA: Diagnosis not present

## 2016-02-13 DIAGNOSIS — C61 Malignant neoplasm of prostate: Secondary | ICD-10-CM | POA: Diagnosis not present

## 2016-02-13 DIAGNOSIS — I10 Essential (primary) hypertension: Secondary | ICD-10-CM | POA: Diagnosis not present

## 2016-02-13 DIAGNOSIS — R7303 Prediabetes: Secondary | ICD-10-CM | POA: Diagnosis not present

## 2016-02-13 DIAGNOSIS — E782 Mixed hyperlipidemia: Secondary | ICD-10-CM | POA: Diagnosis not present

## 2016-02-13 DIAGNOSIS — Z23 Encounter for immunization: Secondary | ICD-10-CM | POA: Diagnosis not present

## 2016-02-13 DIAGNOSIS — I739 Peripheral vascular disease, unspecified: Secondary | ICD-10-CM | POA: Diagnosis not present

## 2016-02-13 DIAGNOSIS — N183 Chronic kidney disease, stage 3 (moderate): Secondary | ICD-10-CM | POA: Diagnosis not present

## 2016-02-13 DIAGNOSIS — R569 Unspecified convulsions: Secondary | ICD-10-CM | POA: Diagnosis not present

## 2016-02-13 DIAGNOSIS — I25810 Atherosclerosis of coronary artery bypass graft(s) without angina pectoris: Secondary | ICD-10-CM | POA: Diagnosis not present

## 2016-02-20 DIAGNOSIS — M7061 Trochanteric bursitis, right hip: Secondary | ICD-10-CM | POA: Diagnosis not present

## 2016-02-20 DIAGNOSIS — M7062 Trochanteric bursitis, left hip: Secondary | ICD-10-CM | POA: Diagnosis not present

## 2016-03-04 DIAGNOSIS — R2689 Other abnormalities of gait and mobility: Secondary | ICD-10-CM | POA: Diagnosis not present

## 2016-03-04 DIAGNOSIS — M25551 Pain in right hip: Secondary | ICD-10-CM | POA: Diagnosis not present

## 2016-03-04 DIAGNOSIS — M25552 Pain in left hip: Secondary | ICD-10-CM | POA: Diagnosis not present

## 2016-03-04 DIAGNOSIS — M6281 Muscle weakness (generalized): Secondary | ICD-10-CM | POA: Diagnosis not present

## 2016-03-10 ENCOUNTER — Ambulatory Visit (INDEPENDENT_AMBULATORY_CARE_PROVIDER_SITE_OTHER): Payer: Medicare Other | Admitting: *Deleted

## 2016-03-10 DIAGNOSIS — I495 Sick sinus syndrome: Secondary | ICD-10-CM | POA: Diagnosis not present

## 2016-03-11 DIAGNOSIS — R2689 Other abnormalities of gait and mobility: Secondary | ICD-10-CM | POA: Diagnosis not present

## 2016-03-11 DIAGNOSIS — M25551 Pain in right hip: Secondary | ICD-10-CM | POA: Diagnosis not present

## 2016-03-11 DIAGNOSIS — M25552 Pain in left hip: Secondary | ICD-10-CM | POA: Diagnosis not present

## 2016-03-11 DIAGNOSIS — M6281 Muscle weakness (generalized): Secondary | ICD-10-CM | POA: Diagnosis not present

## 2016-03-11 NOTE — Progress Notes (Signed)
Remote pacemaker transmission.   

## 2016-03-13 DIAGNOSIS — M25551 Pain in right hip: Secondary | ICD-10-CM | POA: Diagnosis not present

## 2016-03-13 DIAGNOSIS — M6281 Muscle weakness (generalized): Secondary | ICD-10-CM | POA: Diagnosis not present

## 2016-03-13 DIAGNOSIS — R2689 Other abnormalities of gait and mobility: Secondary | ICD-10-CM | POA: Diagnosis not present

## 2016-03-13 DIAGNOSIS — M25552 Pain in left hip: Secondary | ICD-10-CM | POA: Diagnosis not present

## 2016-03-16 DIAGNOSIS — M25552 Pain in left hip: Secondary | ICD-10-CM | POA: Diagnosis not present

## 2016-03-16 DIAGNOSIS — M25551 Pain in right hip: Secondary | ICD-10-CM | POA: Diagnosis not present

## 2016-03-16 DIAGNOSIS — R2689 Other abnormalities of gait and mobility: Secondary | ICD-10-CM | POA: Diagnosis not present

## 2016-03-16 DIAGNOSIS — M6281 Muscle weakness (generalized): Secondary | ICD-10-CM | POA: Diagnosis not present

## 2016-03-18 DIAGNOSIS — M25551 Pain in right hip: Secondary | ICD-10-CM | POA: Diagnosis not present

## 2016-03-18 DIAGNOSIS — R2689 Other abnormalities of gait and mobility: Secondary | ICD-10-CM | POA: Diagnosis not present

## 2016-03-18 DIAGNOSIS — M6281 Muscle weakness (generalized): Secondary | ICD-10-CM | POA: Diagnosis not present

## 2016-03-18 DIAGNOSIS — M25552 Pain in left hip: Secondary | ICD-10-CM | POA: Diagnosis not present

## 2016-03-24 ENCOUNTER — Telehealth: Payer: Self-pay | Admitting: Cardiology

## 2016-03-24 LAB — CUP PACEART REMOTE DEVICE CHECK
Battery Impedance: 254 Ohm
Battery Remaining Longevity: 128 mo
Battery Voltage: 2.79 V
Date Time Interrogation Session: 20170530103547
Implantable Lead Implant Date: 20120609
Implantable Lead Implant Date: 20120609
Implantable Lead Location: 753859
Implantable Lead Model: 5076
Lead Channel Impedance Value: 463 Ohm
Lead Channel Setting Pacing Pulse Width: 0.4 ms
Lead Channel Setting Sensing Sensitivity: 4 mV
MDC IDC LEAD LOCATION: 753860
MDC IDC MSMT LEADCHNL RA IMPEDANCE VALUE: 67 Ohm
MDC IDC SET LEADCHNL RV PACING AMPLITUDE: 2.5 V
MDC IDC STAT BRADY RV PERCENT PACED: 21 %

## 2016-03-24 NOTE — Telephone Encounter (Signed)
Returned pt call. Informed pt that  transmission was received 03/10/2016 and next remote 06/09/2016.

## 2016-03-24 NOTE — Telephone Encounter (Signed)
New Message ° °4. Are you calling to see if we received your device transmission? Yes  ° °

## 2016-03-25 DIAGNOSIS — M25552 Pain in left hip: Secondary | ICD-10-CM | POA: Diagnosis not present

## 2016-03-25 DIAGNOSIS — R2689 Other abnormalities of gait and mobility: Secondary | ICD-10-CM | POA: Diagnosis not present

## 2016-03-25 DIAGNOSIS — M25551 Pain in right hip: Secondary | ICD-10-CM | POA: Diagnosis not present

## 2016-03-25 DIAGNOSIS — M6281 Muscle weakness (generalized): Secondary | ICD-10-CM | POA: Diagnosis not present

## 2016-03-26 DIAGNOSIS — R2689 Other abnormalities of gait and mobility: Secondary | ICD-10-CM | POA: Diagnosis not present

## 2016-03-26 DIAGNOSIS — M25552 Pain in left hip: Secondary | ICD-10-CM | POA: Diagnosis not present

## 2016-03-26 DIAGNOSIS — M25551 Pain in right hip: Secondary | ICD-10-CM | POA: Diagnosis not present

## 2016-03-26 DIAGNOSIS — M6281 Muscle weakness (generalized): Secondary | ICD-10-CM | POA: Diagnosis not present

## 2016-03-30 DIAGNOSIS — R2689 Other abnormalities of gait and mobility: Secondary | ICD-10-CM | POA: Diagnosis not present

## 2016-03-30 DIAGNOSIS — M25552 Pain in left hip: Secondary | ICD-10-CM | POA: Diagnosis not present

## 2016-03-30 DIAGNOSIS — M25551 Pain in right hip: Secondary | ICD-10-CM | POA: Diagnosis not present

## 2016-03-30 DIAGNOSIS — M6281 Muscle weakness (generalized): Secondary | ICD-10-CM | POA: Diagnosis not present

## 2016-03-31 ENCOUNTER — Encounter: Payer: Self-pay | Admitting: Cardiology

## 2016-04-01 DIAGNOSIS — M25551 Pain in right hip: Secondary | ICD-10-CM | POA: Diagnosis not present

## 2016-04-01 DIAGNOSIS — M25552 Pain in left hip: Secondary | ICD-10-CM | POA: Diagnosis not present

## 2016-04-01 DIAGNOSIS — M6281 Muscle weakness (generalized): Secondary | ICD-10-CM | POA: Diagnosis not present

## 2016-04-01 DIAGNOSIS — R2689 Other abnormalities of gait and mobility: Secondary | ICD-10-CM | POA: Diagnosis not present

## 2016-04-06 DIAGNOSIS — R2689 Other abnormalities of gait and mobility: Secondary | ICD-10-CM | POA: Diagnosis not present

## 2016-04-06 DIAGNOSIS — M25552 Pain in left hip: Secondary | ICD-10-CM | POA: Diagnosis not present

## 2016-04-06 DIAGNOSIS — M25551 Pain in right hip: Secondary | ICD-10-CM | POA: Diagnosis not present

## 2016-04-06 DIAGNOSIS — M6281 Muscle weakness (generalized): Secondary | ICD-10-CM | POA: Diagnosis not present

## 2016-04-08 DIAGNOSIS — M25551 Pain in right hip: Secondary | ICD-10-CM | POA: Diagnosis not present

## 2016-04-08 DIAGNOSIS — R2689 Other abnormalities of gait and mobility: Secondary | ICD-10-CM | POA: Diagnosis not present

## 2016-04-08 DIAGNOSIS — M6281 Muscle weakness (generalized): Secondary | ICD-10-CM | POA: Diagnosis not present

## 2016-04-08 DIAGNOSIS — M25552 Pain in left hip: Secondary | ICD-10-CM | POA: Diagnosis not present

## 2016-04-20 DIAGNOSIS — M166 Other bilateral secondary osteoarthritis of hip: Secondary | ICD-10-CM | POA: Diagnosis not present

## 2016-04-20 DIAGNOSIS — F0150 Vascular dementia without behavioral disturbance: Secondary | ICD-10-CM | POA: Diagnosis not present

## 2016-06-07 ENCOUNTER — Other Ambulatory Visit: Payer: Self-pay | Admitting: Cardiology

## 2016-06-09 ENCOUNTER — Telehealth: Payer: Self-pay | Admitting: Cardiology

## 2016-06-09 ENCOUNTER — Ambulatory Visit (INDEPENDENT_AMBULATORY_CARE_PROVIDER_SITE_OTHER): Payer: Medicare Other | Admitting: *Deleted

## 2016-06-09 DIAGNOSIS — I495 Sick sinus syndrome: Secondary | ICD-10-CM | POA: Diagnosis not present

## 2016-06-09 NOTE — Telephone Encounter (Signed)
Confirmed remote transmission w/ pt wife.   

## 2016-06-10 NOTE — Progress Notes (Signed)
Remote pacemaker transmission.   

## 2016-06-11 ENCOUNTER — Encounter: Payer: Self-pay | Admitting: Cardiology

## 2016-06-16 ENCOUNTER — Other Ambulatory Visit: Payer: Self-pay | Admitting: Nurse Practitioner

## 2016-06-16 ENCOUNTER — Ambulatory Visit
Admission: RE | Admit: 2016-06-16 | Discharge: 2016-06-16 | Disposition: A | Discharge status: Home / Self Care | Payer: Medicare Other | Source: Ambulatory Visit | Attending: Nurse Practitioner | Admitting: Nurse Practitioner

## 2016-06-16 DIAGNOSIS — J189 Pneumonia, unspecified organism: Secondary | ICD-10-CM | POA: Diagnosis not present

## 2016-06-16 DIAGNOSIS — R05 Cough: Secondary | ICD-10-CM | POA: Diagnosis not present

## 2016-06-19 LAB — CUP PACEART REMOTE DEVICE CHECK
Battery Impedance: 278 Ohm
Battery Remaining Longevity: 124 mo
Battery Voltage: 2.79 V
Brady Statistic RV Percent Paced: 21 %
Date Time Interrogation Session: 20170829153219
Implantable Lead Implant Date: 20120609
Implantable Lead Location: 753860
Implantable Lead Model: 5076
Lead Channel Impedance Value: 67 Ohm
Lead Channel Pacing Threshold Amplitude: 0.875 V
Lead Channel Setting Pacing Amplitude: 2.5 V
Lead Channel Setting Sensing Sensitivity: 2.8 mV
MDC IDC LEAD IMPLANT DT: 20120609
MDC IDC LEAD LOCATION: 753859
MDC IDC MSMT LEADCHNL RV IMPEDANCE VALUE: 415 Ohm
MDC IDC MSMT LEADCHNL RV PACING THRESHOLD PULSEWIDTH: 0.4 ms
MDC IDC MSMT LEADCHNL RV SENSING INTR AMPL: 8 mV
MDC IDC SET LEADCHNL RV PACING PULSEWIDTH: 0.4 ms

## 2016-06-29 DIAGNOSIS — J439 Emphysema, unspecified: Secondary | ICD-10-CM | POA: Diagnosis not present

## 2016-06-29 DIAGNOSIS — E782 Mixed hyperlipidemia: Secondary | ICD-10-CM | POA: Diagnosis not present

## 2016-06-29 DIAGNOSIS — F0150 Vascular dementia without behavioral disturbance: Secondary | ICD-10-CM | POA: Diagnosis not present

## 2016-06-29 DIAGNOSIS — I10 Essential (primary) hypertension: Secondary | ICD-10-CM | POA: Diagnosis not present

## 2016-06-29 DIAGNOSIS — N183 Chronic kidney disease, stage 3 (moderate): Secondary | ICD-10-CM | POA: Diagnosis not present

## 2016-06-29 DIAGNOSIS — F324 Major depressive disorder, single episode, in partial remission: Secondary | ICD-10-CM | POA: Diagnosis not present

## 2016-06-29 DIAGNOSIS — I482 Chronic atrial fibrillation: Secondary | ICD-10-CM | POA: Diagnosis not present

## 2016-06-29 DIAGNOSIS — K219 Gastro-esophageal reflux disease without esophagitis: Secondary | ICD-10-CM | POA: Diagnosis not present

## 2016-06-29 DIAGNOSIS — Z23 Encounter for immunization: Secondary | ICD-10-CM | POA: Diagnosis not present

## 2016-06-29 DIAGNOSIS — Z09 Encounter for follow-up examination after completed treatment for conditions other than malignant neoplasm: Secondary | ICD-10-CM | POA: Diagnosis not present

## 2016-07-06 ENCOUNTER — Ambulatory Visit (INDEPENDENT_AMBULATORY_CARE_PROVIDER_SITE_OTHER): Payer: Medicare Other | Admitting: Cardiology

## 2016-07-06 ENCOUNTER — Encounter: Payer: Self-pay | Admitting: Cardiology

## 2016-07-06 VITALS — BP 130/62 | HR 98 | Ht 69.0 in | Wt 193.8 lb

## 2016-07-06 DIAGNOSIS — I4821 Permanent atrial fibrillation: Secondary | ICD-10-CM

## 2016-07-06 DIAGNOSIS — I2510 Atherosclerotic heart disease of native coronary artery without angina pectoris: Secondary | ICD-10-CM | POA: Diagnosis not present

## 2016-07-06 DIAGNOSIS — I482 Chronic atrial fibrillation: Secondary | ICD-10-CM | POA: Diagnosis not present

## 2016-07-06 DIAGNOSIS — I10 Essential (primary) hypertension: Secondary | ICD-10-CM

## 2016-07-06 NOTE — Patient Instructions (Signed)

## 2016-07-06 NOTE — Progress Notes (Signed)
[place]. [address], [address] [place], [place]  [address] Phone: [phone]  Fax:  [phone]  Date:  07/06/2016   ID:  HARVARD QUATTROCHI, DOB 03-10-1933, MRN [id number]  PCP:  Wenda Low, MD   History of Present Illness: Zachary Warren is a 82 y.o. male with a hx of coronary artery disease status post bypass in 2003,and Atrial fibrillation cardioversion, x 3, failed amiodarone secondary to return of atrial fibrillation despite maintenance therapy, discontinuation of amiodarone. He's currently on anticoagulation with Pradaxa. He had pacemaker placed by Dr. Rayann Heman 6/12.   His pacemaker is functioning well.   He has been diagnosed with vascular dementia. 5 days a week Butch Penny , his driver, has been helping out for a few hours a day. He enjoys going to play cards.  Vascular dementia. Wife goes to [facility] support group. Wife is an Training and development officer. Used to be a Chartered certified accountant. She won gold metal quilting  Had minor hematuria, transient.   Overall, no significant shortness of breath, no fevers, no chills, no syncope.  Wt Readings from Last 3 Encounters:  07/06/16 193 lb 12.8 oz (87.9 kg)  01/01/16 194 lb (88 kg)  09/09/15 194 lb (88 kg)     Past Medical History:  Diagnosis Date  . Coronary artery disease    s/p 5v CABG 2002  . CVA (cerebral infarction) 1999  . DDD (degenerative disc disease), cervical   . Depression   . GERD (gastroesophageal reflux disease)   . Hyperlipidemia   . Hypertension   . Memory impairment   . OSA (obstructive sleep apnea)    Recently diagnosed though pt. has not had CPAP trial  . Pacemaker   . Persistent atrial fibrillation ([place])    on pradaxa  . Prostate cancer ([place]) 2006   s/p XRT  . PVD (peripheral vascular disease) ([place])   . Seizures ([place])   . Tachycardia-bradycardia [facility])    s/p PPM by [facility] 6/12    Past Surgical History:  Procedure Laterality Date  . CARDIOVERSION N/A 08/21/2013   Procedure: CARDIOVERSION;  Surgeon: Candee Furbish, MD;   Location: [place];  Service: Cardiovascular;  Laterality: N/A;  . [place]  . CORONARY ARTERY BYPASS GRAFT  2002  . INSERT / REPLACE / REMOVE PACEMAKER    . PACEMAKER INSERTION     implanted by JA 6/12    Current Outpatient Prescriptions  Medication Sig Dispense Refill  . atorvastatin (LIPITOR) 40 MG tablet Take 1 tablet (40 mg total) by mouth daily. Patient due for a lipid panel. 90 tablet 1  . Budesonide-Formoterol Fumarate (SYMBICORT IN) Inhale 2 puffs into the lungs daily.    Marland Kitchen diltiazem (CARDIZEM CD) 180 MG 24 hr capsule TAKE 1 CAPSULE BY MOUTH TWICE DAILY 180 capsule 2  . FLUoxetine (PROZAC) 40 MG capsule Take 40 mg by mouth daily.      . Lutein 20 MG CAPS Take 1 capsule by mouth daily.     . Multiple Vitamins-Minerals (MULTIVITAMIN WITH MINERALS) tablet Take 1 tablet by mouth daily. Chewable flinstones    . omeprazole (PRILOSEC OTC) 20 MG tablet Take 20 mg by mouth 2 (two) times daily.      Marland Kitchen PRADAXA 150 MG CAPS capsule TAKE 1 CAPSULE BY MOUTH TWICE DAILY 60 capsule 11  . topiramate (TOPAMAX) 50 MG tablet Take 1 tablet (50 mg total) by mouth 2 (two) times daily. 180 tablet 4   No current facility-administered medications for this visit.  Allergies:    Allergies  Allergen Reactions  . Aricept [Donepezil Hcl] Other (See Comments)    "stomach pain", generic  . Namenda [Memantine Hcl] Other (See Comments)    "dizzy", brand medication  . Ramipril Other (See Comments)     cough    Social History:  The patient  reports that he quit smoking about 18 years ago. His smoking use included Cigarettes. He has a 96.00 pack-year smoking history. He has never used smokeless tobacco. He reports that he does not drink alcohol or use drugs.   ROS:  Please see the history of present illness.   Positive for atrial fibrillation, occasional fatigue. No change after cardioversion.  PHYSICAL EXAM: VS:  BP 130/62   Pulse 98   Ht 5\' 9"  (1.753 m)   Wt 193 lb 12.8 oz (87.9 kg)   BMI  28.62 kg/m  Well nourished, well developed, in no acute distress HEENT: normal Neck: no JVD Cardiac:  Irregularly irregular, normal rate; no murmur Lungs:  Upper airway congestion notedLeft mild crackles. Abd: soft, nontender, no hepatomegalyOverweight, improved Ext: no edema Skin: warm and dry Neuro: no focal abnormalities noted  EKG:  10/16/13-heart rate 103, atrial fibrillation     ASSESSMENT AND PLAN:  1. Atrial fibrillation- chronic atrial fibrillation. Rate control has been somewhat challenging off of amiodarone but is adequate.   He did not feel any different after cardioversion in 11/14. Took him off of antiarrhythmic amiodarone because of persistent A. fib. Hence, we stopped his amiodarone in December of 2015.  Weight loss of over 50 pounds. Doing well.No changes.  2. Chronic anticoagulation- Pradaxa. Keeping up with lab work.She appreciates samples possible.  3. CAD - stable, no angina 4. Hyperlipidemia - LDL goal 70.  5. Tachycardia/bradycardia syndrome-pacemaker functioning well. 6. Obesity- doing very well. He was eating thickened liquids. Prior Aspiration. 7. Upper airway congestion/mucous production. Aspiration precautions 8. We'll see him back in 6 months.  Signed,         Candee Furbish, MD [facility]  07/06/2016 3:06 PM

## 2016-08-11 ENCOUNTER — Encounter: Payer: Self-pay | Admitting: Physician Assistant

## 2016-08-18 ENCOUNTER — Other Ambulatory Visit: Payer: Self-pay | Admitting: Diagnostic Neuroimaging

## 2016-08-25 ENCOUNTER — Ambulatory Visit (INDEPENDENT_AMBULATORY_CARE_PROVIDER_SITE_OTHER): Payer: Medicare Other | Admitting: Physician Assistant

## 2016-08-25 ENCOUNTER — Encounter: Payer: Self-pay | Admitting: Physician Assistant

## 2016-08-25 VITALS — BP 120/58 | HR 93 | Ht 69.0 in | Wt 197.0 lb

## 2016-08-25 DIAGNOSIS — I2510 Atherosclerotic heart disease of native coronary artery without angina pectoris: Secondary | ICD-10-CM | POA: Diagnosis not present

## 2016-08-25 DIAGNOSIS — I4891 Unspecified atrial fibrillation: Secondary | ICD-10-CM | POA: Diagnosis not present

## 2016-08-25 DIAGNOSIS — Z79899 Other long term (current) drug therapy: Secondary | ICD-10-CM | POA: Diagnosis not present

## 2016-08-25 DIAGNOSIS — I482 Chronic atrial fibrillation: Secondary | ICD-10-CM | POA: Diagnosis not present

## 2016-08-25 DIAGNOSIS — I10 Essential (primary) hypertension: Secondary | ICD-10-CM

## 2016-08-25 DIAGNOSIS — I495 Sick sinus syndrome: Secondary | ICD-10-CM | POA: Diagnosis not present

## 2016-08-25 DIAGNOSIS — I4821 Permanent atrial fibrillation: Secondary | ICD-10-CM

## 2016-08-25 LAB — CBC
HCT: 41.6 % (ref 38.5–50.0)
HEMOGLOBIN: 13.6 g/dL (ref 13.2–17.1)
MCH: 30.1 pg (ref 27.0–33.0)
MCHC: 32.7 g/dL (ref 32.0–36.0)
MCV: 92 fL (ref 80.0–100.0)
MPV: 10.2 fL (ref 7.5–12.5)
PLATELETS: 209 10*3/uL (ref 140–400)
RBC: 4.52 MIL/uL (ref 4.20–5.80)
RDW: 14.4 % (ref 11.0–15.0)
WBC: 9.1 10*3/uL (ref 3.8–10.8)

## 2016-08-25 LAB — BASIC METABOLIC PANEL
BUN: 20 mg/dL (ref 7–25)
CALCIUM: 9.1 mg/dL (ref 8.6–10.3)
CHLORIDE: 107 mmol/L (ref 98–110)
CO2: 29 mmol/L (ref 20–31)
CREATININE: 1.28 mg/dL — AB (ref 0.70–1.11)
GLUCOSE: 93 mg/dL (ref 65–99)
Potassium: 3.7 mmol/L (ref 3.5–5.3)
Sodium: 143 mmol/L (ref 135–146)

## 2016-08-25 NOTE — Progress Notes (Signed)
Cardiology Office Note Date:  08/25/2016  Patient ID:  Zachary Warren, DOB 20-Jun-1933, MRN [id number] PCP:  Zachary Low, MD  Cardiologist:  Dr. Marlou Warren Electrophysiologist: Dr. Rayann Warren   Chief Complaint: annual in-clinic EP visit  History of Present Illness: Zachary Warren is a 82 y.o. male with history of CAD (CABG 2003), CVA, HTN, HLD, vascular dementia, Permanent AF, tachy-brady w/PPM, hx of issues with aspiration, no symptoms of illness are reported, they mention his cough as chronic for years, his wife also mentions at his last PMD visit being told he had CRI stage III.  He comes in to be seen for Dr. Rayann Warren, last seen by EP service a Nov 2016 by A. Lynnell Jude, NP, doing well at that time.   The patient is accompanied by his wife.  He (they) report he is doing quite well despite his dementia.  They have a care giver for 5hours day M-F and gets him out of the house he goes to a center and plays cards (mentioning he was pretty good historically in [place]!)  He denies any kind of symptoms, he was a heavy smoker for many years and has a chronic cough, no SOB, no CP, palpitations, no dizziness, near syncope or syncope.  He has not had any other episodes but the one of some minimal hematuria, noted quite some time ago.  No other bleeding or signs of bleeding.  He has not had any falls.  AF history: DCCV 2014 AAD: amiodarone d/c 2015 given persistent AF  DEVICE information:  MDT dual chamber PPM, implanted 03/21/11, SSSx, Dr. Rayann Warren (programmed VVIR given development of permanent AF)  Past Medical History:  Diagnosis Date  . Coronary artery disease    s/p 5v CABG 2002  . CVA (cerebral infarction) 1999  . DDD (degenerative disc disease), cervical   . Depression   . GERD (gastroesophageal reflux disease)   . Hyperlipidemia   . Hypertension   . Memory impairment   . OSA (obstructive sleep apnea)    Recently diagnosed though pt. has not had CPAP trial  . Pacemaker   . Persistent atrial  fibrillation ([place])    on pradaxa  . Prostate cancer ([place]) 2006   s/p XRT  . PVD (peripheral vascular disease) ([place])   . Seizures ([place])   . Tachycardia-bradycardia [facility])    s/p PPM by Zachary Warren 6/12    Past Surgical History:  Procedure Laterality Date  . CARDIOVERSION N/A 08/21/2013   Procedure: CARDIOVERSION;  Surgeon: Candee Furbish, MD;  Location: [place];  Service: Cardiovascular;  Laterality: N/A;  . [place]  . CORONARY ARTERY BYPASS GRAFT  2002  . INSERT / REPLACE / REMOVE PACEMAKER    . PACEMAKER INSERTION     implanted by Zachary Warren 6/12    Current Outpatient Prescriptions  Medication Sig Dispense Refill  . atorvastatin (LIPITOR) 40 MG tablet Take 1 tablet (40 mg total) by mouth daily. Patient due for a lipid panel. 90 tablet 1  . Budesonide-Formoterol Fumarate (SYMBICORT IN) Inhale 2 puffs into the lungs daily.    Marland Kitchen diltiazem (CARDIZEM CD) 180 MG 24 hr capsule TAKE 1 CAPSULE BY MOUTH TWICE DAILY 180 capsule 2  . FLUoxetine (PROZAC) 40 MG capsule Take 40 mg by mouth daily.      . Multiple Vitamins-Minerals (MULTIVITAMIN WITH MINERALS) tablet Take 1 tablet by mouth daily. Chewable flinstones    . omeprazole (PRILOSEC OTC) 20 MG tablet Take 20 mg by mouth 2 (two) times daily.      Marland Kitchen  PRADAXA 150 MG CAPS capsule TAKE 1 CAPSULE BY MOUTH TWICE DAILY 60 capsule 11  . topiramate (TOPAMAX) 50 MG tablet Take 1 tablet (50 mg total) by mouth 2 (two) times daily. 180 tablet 4   No current facility-administered medications for this visit.     Allergies:   Aricept [donepezil hcl]; Namenda [memantine hcl]; and Ramipril   Social History:  The patient  reports that he quit smoking about 18 years ago. His smoking use included Cigarettes. He has a 96.00 pack-year smoking history. He has never used smokeless tobacco. He reports that he does not drink alcohol or use drugs.   Family History:  The patient's family history includes Cancer in his brother, father, and mother; Ovarian cancer in his mother;  Pancreatic cancer in his father.  ROS:  Please see the history of present illness.  All other systems are reviewed and otherwise negative.   PHYSICAL EXAM:  VS:  BP (!) 120/58   Pulse 93   Ht 5\' 9"  (1.753 m)   Wt 197 lb (89.4 kg)   BMI 29.09 kg/m  BMI: Body mass index is 29.09 kg/m. Well nourished, well developed, in no acute distress  HEENT: normocephalic, atraumatic  Neck: no JVD, carotid bruits or masses Cardiac:  IRRR; no significant murmurs, no rubs, or gallops Lungs:  Note audible bronchial type breaths sounds at baseline that clear w/cough, then with auscultation lung fields are clear to auscultation bilaterally, no wheezing, rhonchi or rales  Abd: soft, nontender MS: no deformity or atrophy Ext: chronic RLE edema reported as unchanged since his CABG years ago, trace LLE edema  Skin: warm and dry, no rash Neuro:  No gross deficits appreciated Psych: euthymic mood, full affect  PPM  site is stable, no tethering or discomfort   EKG:  Done today and reviewed by myselfshows AFib, paced/non-paced complexes noted PPM interrogation today and reviewed by myself: Battery and lead status stable, normal device function, HVR noted are AF, longest 5 seconds  Recent Labs: 09/09/2015: BUN 17; Creat 1.01; Hemoglobin 13.6; Platelets 209; Potassium 3.7; Sodium 146  No results found for requested labs within last 8760 hours.   CrCl cannot be calculated (Patient's most recent lab result is older than the maximum 21 days allowed.).   Wt Readings from Last 3 Encounters:  08/25/16 197 lb (89.4 kg)  07/06/16 193 lb 12.8 oz (87.9 kg)  01/01/16 194 lb (88 kg)     Other studies reviewed: Additional studies/records reviewed today include: summarized above  ASSESSMENT AND PLAN:  1. SSSx w/ PPM     normal device function, no changes     Continue q 13month remotes  2. Permanent AFib     CHA2ds2vasc is at least 6 on Pradaxa      Will update labs today  3. CAD     Stable, no symptoms      C/w Dr. Marlou Warren  4. HTN     Stable  5. Chronic cough     Wife and patient both remark this is unchanged for many years     No SOB, no symptoms of illness   Disposition: F/u with remotes every 3 months, Dr. Rayann Warren in year, sooner if needed.  Current medicines are reviewed at length with the patient today.  The patient did not have any concerns regarding medicines.  Haywood Lasso, PA-C 08/25/2016 4:12 PM     [place] [address] [place] [address] [phone] (office)  [phone] (  fax)

## 2016-08-25 NOTE — Patient Instructions (Signed)
Medication Instructions:  Your physician recommends that you continue on your current medications as directed. Please refer to the Current Medication list given to you today.     If you need a refill on your cardiac medications before your next appointment, please call your pharmacy.  Labwork:  BMET AND CBC    Testing/Procedures: NONE ORDERED  TODAY    Follow-Up: Your physician wants you to follow-up in: [place] will receive a reminder letter in the mail two months in advance. If you don't receive a letter, please call our office to schedule the follow-up appointment.   Remote monitoring is used to monitor your Pacemaker of ICD from home. This monitoring reduces the number of office visits required to check your device to one time per year. It allows [place] to keep an eye on the functioning of your device to ensure it is working properly. You are scheduled for a device check from home on .Marland KitchenMarland Kitchen2/13/18  You may send your transmission at any time that day. If you have a wireless device, the transmission will be sent automatically. After your physician reviews your transmission, you will receive a postcard with your next transmission date.    Any Other Special Instructions Will Be Listed Below (If Applicable).

## 2016-08-26 ENCOUNTER — Telehealth: Payer: Self-pay | Admitting: *Deleted

## 2016-08-26 NOTE — Telephone Encounter (Signed)
SPOKE TO PT WIFE ABOUT RESULTS AND VERBALIZED UNDERSTANDING

## 2016-08-26 NOTE — Telephone Encounter (Signed)
-----   Message from [facility] [place] sent at 08/26/2016  9:41 AM EST ----- Please let the patient (his wife) know the labs looked ok, no changes to his Pradaxa.  Thanks [employer]

## 2016-09-14 DIAGNOSIS — N183 Chronic kidney disease, stage 3 (moderate): Secondary | ICD-10-CM | POA: Diagnosis not present

## 2016-09-14 DIAGNOSIS — R609 Edema, unspecified: Secondary | ICD-10-CM | POA: Diagnosis not present

## 2016-09-14 DIAGNOSIS — I5032 Chronic diastolic (congestive) heart failure: Secondary | ICD-10-CM | POA: Diagnosis not present

## 2016-09-17 ENCOUNTER — Other Ambulatory Visit: Payer: Self-pay | Admitting: Internal Medicine

## 2016-09-24 DIAGNOSIS — I509 Heart failure, unspecified: Secondary | ICD-10-CM | POA: Diagnosis not present

## 2016-09-24 DIAGNOSIS — R609 Edema, unspecified: Secondary | ICD-10-CM | POA: Diagnosis not present

## 2016-09-28 ENCOUNTER — Other Ambulatory Visit: Payer: Self-pay | Admitting: Internal Medicine

## 2016-11-02 DIAGNOSIS — I482 Chronic atrial fibrillation: Secondary | ICD-10-CM | POA: Diagnosis not present

## 2016-11-02 DIAGNOSIS — F0150 Vascular dementia without behavioral disturbance: Secondary | ICD-10-CM | POA: Diagnosis not present

## 2016-11-02 DIAGNOSIS — I10 Essential (primary) hypertension: Secondary | ICD-10-CM | POA: Diagnosis not present

## 2016-11-02 DIAGNOSIS — E782 Mixed hyperlipidemia: Secondary | ICD-10-CM | POA: Diagnosis not present

## 2016-11-02 DIAGNOSIS — N183 Chronic kidney disease, stage 3 (moderate): Secondary | ICD-10-CM | POA: Diagnosis not present

## 2016-11-02 DIAGNOSIS — I25810 Atherosclerosis of coronary artery bypass graft(s) without angina pectoris: Secondary | ICD-10-CM | POA: Diagnosis not present

## 2016-11-02 DIAGNOSIS — I739 Peripheral vascular disease, unspecified: Secondary | ICD-10-CM | POA: Diagnosis not present

## 2016-11-02 DIAGNOSIS — I5032 Chronic diastolic (congestive) heart failure: Secondary | ICD-10-CM | POA: Diagnosis not present

## 2016-11-02 DIAGNOSIS — F324 Major depressive disorder, single episode, in partial remission: Secondary | ICD-10-CM | POA: Diagnosis not present

## 2016-11-02 DIAGNOSIS — K219 Gastro-esophageal reflux disease without esophagitis: Secondary | ICD-10-CM | POA: Diagnosis not present

## 2016-11-02 DIAGNOSIS — R569 Unspecified convulsions: Secondary | ICD-10-CM | POA: Diagnosis not present

## 2016-11-02 DIAGNOSIS — C61 Malignant neoplasm of prostate: Secondary | ICD-10-CM | POA: Diagnosis not present

## 2016-11-24 ENCOUNTER — Ambulatory Visit (INDEPENDENT_AMBULATORY_CARE_PROVIDER_SITE_OTHER): Payer: Medicare Other | Admitting: *Deleted

## 2016-11-24 ENCOUNTER — Telehealth: Payer: Self-pay | Admitting: Cardiology

## 2016-11-24 DIAGNOSIS — I495 Sick sinus syndrome: Secondary | ICD-10-CM | POA: Diagnosis not present

## 2016-11-24 NOTE — Telephone Encounter (Signed)
Confirmed remote transmission w/ pt wife.   

## 2016-11-25 ENCOUNTER — Encounter: Payer: Self-pay | Admitting: Cardiology

## 2016-11-25 NOTE — Progress Notes (Signed)
Remote pacemaker transmission.   

## 2016-11-26 LAB — CUP PACEART REMOTE DEVICE CHECK
Battery Impedance: 327 Ohm
Battery Remaining Longevity: 117 mo
Battery Voltage: 2.79 V
Implantable Lead Location: 753860
Implantable Lead Model: 5076
Implantable Lead Model: 5076
Implantable Pulse Generator Implant Date: 20120609
Lead Channel Pacing Threshold Amplitude: 0.875 V
Lead Channel Pacing Threshold Pulse Width: 0.4 ms
Lead Channel Setting Sensing Sensitivity: 4 mV
MDC IDC LEAD IMPLANT DT: 20120609
MDC IDC LEAD IMPLANT DT: 20120609
MDC IDC LEAD LOCATION: 753859
MDC IDC MSMT LEADCHNL RA IMPEDANCE VALUE: 67 Ohm
MDC IDC MSMT LEADCHNL RV IMPEDANCE VALUE: 429 Ohm
MDC IDC MSMT LEADCHNL RV SENSING INTR AMPL: 8 mV
MDC IDC SESS DTM: 20180213193316
MDC IDC SET LEADCHNL RV PACING AMPLITUDE: 2.5 V
MDC IDC SET LEADCHNL RV PACING PULSEWIDTH: 0.4 ms
MDC IDC STAT BRADY RV PERCENT PACED: 22 %

## 2016-12-09 ENCOUNTER — Other Ambulatory Visit: Payer: Self-pay | Admitting: Cardiology

## 2017-01-04 ENCOUNTER — Encounter: Payer: Self-pay | Admitting: Cardiology

## 2017-01-04 ENCOUNTER — Encounter (INDEPENDENT_AMBULATORY_CARE_PROVIDER_SITE_OTHER): Payer: Self-pay

## 2017-01-04 ENCOUNTER — Ambulatory Visit (INDEPENDENT_AMBULATORY_CARE_PROVIDER_SITE_OTHER): Payer: Medicare Other | Admitting: Cardiology

## 2017-01-04 VITALS — BP 118/62 | HR 98 | Ht 69.0 in | Wt 195.8 lb

## 2017-01-04 DIAGNOSIS — Z79899 Other long term (current) drug therapy: Secondary | ICD-10-CM

## 2017-01-04 DIAGNOSIS — Z950 Presence of cardiac pacemaker: Secondary | ICD-10-CM | POA: Diagnosis not present

## 2017-01-04 DIAGNOSIS — I4821 Permanent atrial fibrillation: Secondary | ICD-10-CM

## 2017-01-04 DIAGNOSIS — I4891 Unspecified atrial fibrillation: Secondary | ICD-10-CM

## 2017-01-04 DIAGNOSIS — I495 Sick sinus syndrome: Secondary | ICD-10-CM

## 2017-01-04 DIAGNOSIS — I482 Chronic atrial fibrillation: Secondary | ICD-10-CM

## 2017-01-04 MED ORDER — FUROSEMIDE 40 MG PO TABS
40.0000 mg | ORAL_TABLET | Freq: Every day | ORAL | 11 refills | Status: DC
Start: 2017-01-04 — End: 2017-05-18

## 2017-01-04 MED ORDER — FUROSEMIDE 20 MG PO TABS: 20.0000 mg | ORAL_TABLET | Freq: Every day | ORAL | 11 refills | Status: DC

## 2017-01-04 NOTE — Patient Instructions (Signed)
Medication Instructions:  Please increase your Furosemide to 40 mg a day. Continue all other medications as listed.  Labwork: Please have blood work in 2 weeks (BMP)  Testing/Procedures: Your physician has requested that you have an echocardiogram. Echocardiography is a painless test that uses sound waves to create images of your heart. It provides your doctor with information about the size and shape of your heart and how well your heart's chambers and valves are working. This procedure takes approximately one hour. There are no restrictions for this procedure.  Follow-Up: Follow up in 6 months with Dr. Marlou Porch.  You will receive a letter in the mail 2 months before you are due.  Please call us when you receive this letter to schedule your follow up appointment.  If you need a refill on your cardiac medications before your next appointment, please call your pharmacy.  Thank you for choosing [facility]!!

## 2017-01-04 NOTE — Progress Notes (Signed)
[place]. [address], [address], [place]  [address] Phone: [phone]  Fax:  [phone]  Date:  01/04/2017   ID:  Zachary Warren, DOB 1933/02/16, MRN [id number]  PCP:  Wenda Low, MD   History of Present Illness: Zachary Warren is a 83 y.o. male with a hx of coronary artery disease status post bypass in 2003,and Atrial fibrillation cardioversion, x 3, failed amiodarone secondary to return of atrial fibrillation despite maintenance therapy, discontinuation of amiodarone. He's currently on anticoagulation with Pradaxa. He had pacemaker placed by Dr. Rayann Heman 6/12.   His pacemaker is functioning well.   He has been diagnosed with vascular dementia. 5 days a week Butch Penny , his driver, has been helping out for a few hours a day. He enjoys going to play cards.  Vascular dementia. Wife goes to [facility] support group. Wife is an Training and development officer. Used to be a Chartered certified accountant. She won gold metal quilting  His nose complaint is lower extremity edema. Right greater than left. Vein extraction from right during prior bypass. No significant change in diet although he consume increased salt and fluid. No chest pain, no significant change in shortness of breath.  Wt Readings from Last 3 Encounters:  01/04/17 195 lb 12.8 oz (88.8 kg)  08/25/16 197 lb (89.4 kg)  07/06/16 193 lb 12.8 oz (87.9 kg)     Past Medical History:  Diagnosis Date  . Coronary artery disease    s/p 5v CABG 2002  . CVA (cerebral infarction) 1999  . DDD (degenerative disc disease), cervical   . Depression   . GERD (gastroesophageal reflux disease)   . Hyperlipidemia   . Hypertension   . Memory impairment   . OSA (obstructive sleep apnea)    Recently diagnosed though pt. has not had CPAP trial  . Pacemaker   . Persistent atrial fibrillation ([facility])    on pradaxa  . Prostate cancer ([place]) 2006   s/p XRT  . PVD (peripheral vascular disease) ([place])   . Seizures ([place])   . Tachycardia-bradycardia [facility])    s/p PPM by [facility] 6/12     Past Surgical History:  Procedure Laterality Date  . CARDIOVERSION N/A 08/21/2013   Procedure: CARDIOVERSION;  Surgeon: Candee Furbish, MD;  Location: [place];  Service: Cardiovascular;  Laterality: N/A;  . [place]  . CORONARY ARTERY BYPASS GRAFT  2002  . INSERT / REPLACE / REMOVE PACEMAKER    . PACEMAKER INSERTION     implanted by JA 6/12    Current Outpatient Prescriptions  Medication Sig Dispense Refill  . atorvastatin (LIPITOR) 40 MG tablet TAKE 1 TABLET BY MOUTH EVERY DAY - PATIENT DUE FOR LIPID PANEL 90 tablet 1  . Budesonide-Formoterol Fumarate (SYMBICORT IN) Inhale 2 puffs into the lungs daily.    Marland Kitchen diltiazem (CARDIZEM CD) 180 MG 24 hr capsule TAKE 1 CAPSULE BY MOUTH TWICE DAILY 180 capsule 3  . ELIQUIS 5 MG TABS tablet Take 1 tablet by mouth 2 (two) times daily.    Marland Kitchen FLUoxetine (PROZAC) 40 MG capsule Take 40 mg by mouth daily.      . furosemide (LASIX) 40 MG tablet Take 1 tablet (40 mg total) by mouth daily. 30 tablet 11  . guaiFENesin (MUCINEX) 600 MG 12 hr tablet Take 600 mg by mouth 2 (two) times daily.    . Multiple Vitamins-Minerals (MULTIVITAMIN WITH MINERALS) tablet Take 1 tablet by mouth daily. Chewable flinstones    . omeprazole (PRILOSEC OTC) 20 MG  tablet Take 20 mg by mouth 2 (two) times daily.      Marland Kitchen topiramate (TOPAMAX) 50 MG tablet Take 1 tablet (50 mg total) by mouth 2 (two) times daily. 180 tablet 4   No current facility-administered medications for this visit.     Allergies:    Allergies  Allergen Reactions  . Aricept [Donepezil Hcl] Other (See Comments)    "stomach pain", generic  . Namenda [Memantine Hcl] Other (See Comments)    "dizzy", brand medication  . Ramipril Other (See Comments)     cough    Social History:  The patient  reports that he quit smoking about 18 years ago. His smoking use included Cigarettes. He has a 96.00 pack-year smoking history. He has never used smokeless tobacco. He reports that he does not drink alcohol or use  drugs.   ROS:  Please see the history of present illness.   Positive for atrial fibrillation, occasional fatigue. No change after cardioversion.  PHYSICAL EXAM: VS:  BP 118/62   Pulse 98   Ht 5\' 9"  (1.753 m)   Wt 195 lb 12.8 oz (88.8 kg)   SpO2 95%   BMI 28.91 kg/m  Well nourished, well developed, in no acute distress  HEENT: normal  Neck: no JVD  Cardiac:  Irregularly irregular, normal rate; no murmur  Lungs:  Upper airway congestion noted right mild crackles. Abd: soft, nontender, no hepatomegaly Overweight, improved Ext: 3+ edema R>L Skin: warm and dry  Neuro: His gait is very slow   EKG:  10/16/13-heart rate 103, atrial fibrillation     ASSESSMENT AND PLAN:  1. Atrial fibrillation-permanent atrial fibrillation. Rate control has been somewhat challenging off of amiodarone but is adequate.   He did not feel any different after cardioversion in 11/14. Took him off of antiarrhythmic amiodarone because of persistent A. fib. Hence, we stopped his amiodarone in December of 2015.  Weight loss of over 50 pounds. Doing well.No changes. Pacemaker in place for tachybradycardia syndrome. 2. Chronic anticoagulation- Pradaxa has been changed over to Eliquis. Doing well. Interestingly, this is when the lower extremity edema was first noted?. Keeping up with lab work.  3. CAD - stable, no angina, post CABG in 2002 - right LE vein extraction.  4. Hyperlipidemia - LDL goal 70.  5. Tachycardia/bradycardia syndrome-pacemaker functioning well. 6. Obesity- doing very well. He was eating thickened liquids. Prior Aspiration. 7. Upper airway congestion/mucous production. Aspiration precautions 8. Lower extremity edema-this is a fairly new process for him. They do eat quite a bit of salt, fast food. He is not very active. I will increase his Lasix to 40 mg once a day. Check basic metabolic profile in 2 weeks. I will also check an echocardiogram. 9. We'll see him back in 6 months.  Signed,         Candee Furbish, MD [facility]  01/04/2017 2:45 PM

## 2017-01-18 ENCOUNTER — Ambulatory Visit (HOSPITAL_COMMUNITY): Payer: Medicare Other

## 2017-01-18 ENCOUNTER — Other Ambulatory Visit: Payer: Medicare Other

## 2017-01-21 ENCOUNTER — Ambulatory Visit (HOSPITAL_COMMUNITY): Payer: Medicare Other | Attending: Cardiology

## 2017-01-21 ENCOUNTER — Other Ambulatory Visit: Payer: Medicare Other | Admitting: *Deleted

## 2017-01-21 ENCOUNTER — Other Ambulatory Visit: Payer: Self-pay

## 2017-01-21 DIAGNOSIS — I4891 Unspecified atrial fibrillation: Secondary | ICD-10-CM | POA: Insufficient documentation

## 2017-01-21 DIAGNOSIS — I081 Rheumatic disorders of both mitral and tricuspid valves: Secondary | ICD-10-CM | POA: Insufficient documentation

## 2017-01-22 LAB — BASIC METABOLIC PANEL
BUN/Creatinine Ratio: 17 (ref 10–24)
BUN: 20 mg/dL (ref 8–27)
CALCIUM: 8.9 mg/dL (ref 8.6–10.2)
CO2: 28 mmol/L (ref 18–29)
CREATININE: 1.16 mg/dL (ref 0.76–1.27)
Chloride: 109 mmol/L — ABNORMAL HIGH (ref 96–106)
GFR calc Af Amer: 67 mL/min/{1.73_m2} (ref >59)
GFR, EST NON AFRICAN AMERICAN: 58 mL/min/{1.73_m2} — AB (ref >59)
Glucose: 99 mg/dL (ref 65–99)
Potassium: 3.5 mmol/L (ref 3.5–5.2)
Sodium: 148 mmol/L — ABNORMAL HIGH (ref 134–144)

## 2017-01-24 ENCOUNTER — Encounter (HOSPITAL_COMMUNITY): Payer: Self-pay

## 2017-01-24 DIAGNOSIS — Z79899 Other long term (current) drug therapy: Secondary | ICD-10-CM | POA: Insufficient documentation

## 2017-01-24 DIAGNOSIS — Z87891 Personal history of nicotine dependence: Secondary | ICD-10-CM | POA: Insufficient documentation

## 2017-01-24 DIAGNOSIS — I2510 Atherosclerotic heart disease of native coronary artery without angina pectoris: Secondary | ICD-10-CM | POA: Insufficient documentation

## 2017-01-24 DIAGNOSIS — I10 Essential (primary) hypertension: Secondary | ICD-10-CM | POA: Insufficient documentation

## 2017-01-24 DIAGNOSIS — R310 Gross hematuria: Secondary | ICD-10-CM | POA: Diagnosis not present

## 2017-01-24 DIAGNOSIS — Z8546 Personal history of malignant neoplasm of prostate: Secondary | ICD-10-CM | POA: Diagnosis not present

## 2017-01-24 DIAGNOSIS — R319 Hematuria, unspecified: Secondary | ICD-10-CM | POA: Diagnosis present

## 2017-01-24 NOTE — ED Triage Notes (Signed)
Patient states that he is having blood in his urine and it is quite a bit more than a trace per spouse.  He told his spouse that he started having blood in his urine yesterday.  He is on Eliquis and he took one last night and one today before I found out that he had blood in his urine per spouse.

## 2017-01-25 ENCOUNTER — Emergency Department (HOSPITAL_COMMUNITY)
Admission: EM | Admit: 2017-01-25 | Discharge: 2017-01-25 | Disposition: A | Discharge status: Home / Self Care | Payer: Medicare Other | Attending: Emergency Medicine | Admitting: Emergency Medicine

## 2017-01-25 DIAGNOSIS — R310 Gross hematuria: Secondary | ICD-10-CM | POA: Diagnosis not present

## 2017-01-25 DIAGNOSIS — R319 Hematuria, unspecified: Secondary | ICD-10-CM | POA: Diagnosis not present

## 2017-01-25 LAB — I-STAT CHEM 8, ED
BUN: 16 mg/dL (ref 6–20)
CALCIUM ION: 1.16 mmol/L (ref 1.15–1.40)
Chloride: 109 mmol/L (ref 101–111)
Creatinine, Ser: 0.9 mg/dL (ref 0.61–1.24)
Glucose, Bld: 109 mg/dL — ABNORMAL HIGH (ref 65–99)
HEMATOCRIT: 43 % (ref 39.0–52.0)
Hemoglobin: 14.6 g/dL (ref 13.0–17.0)
POTASSIUM: 3.2 mmol/L — AB (ref 3.5–5.1)
SODIUM: 145 mmol/L (ref 135–145)
TCO2: 25 mmol/L (ref 0–100)

## 2017-01-25 LAB — URINALYSIS, ROUTINE W REFLEX MICROSCOPIC
BACTERIA UA: NONE SEEN
Bilirubin Urine: NEGATIVE
Glucose, UA: NEGATIVE mg/dL
Ketones, ur: NEGATIVE mg/dL
Leukocytes, UA: NEGATIVE
Nitrite: NEGATIVE
Protein, ur: 30 mg/dL — AB
SPECIFIC GRAVITY, URINE: 1.012 (ref 1.005–1.030)
SQUAMOUS EPITHELIAL / LPF: NONE SEEN
pH: 8 (ref 5.0–8.0)

## 2017-01-25 NOTE — Discharge Instructions (Signed)
Follow-up closely with your primary doctor to discuss your blood thinner. Hold your blood thinner for 24 hours. Return if you feel like you to pass out, syncope, inability to urinate, fevers or other symptoms. Follow-up your urine culture in 48 hours.  If you were given medicines take as directed.  If you are on coumadin or contraceptives realize their levels and effectiveness is altered by many different medicines.  If you have any reaction (rash, tongues swelling, other) to the medicines stop taking and see a physician.    If your blood pressure was elevated in the ER make sure you follow up for management with a primary doctor or return for chest pain, shortness of breath or stroke symptoms.  Please follow up as directed and return to the ER or see a physician for new or worsening symptoms.  Thank you. Vitals:   01/24/17 2028 01/24/17 2032  BP: (!) 130/94   Pulse: 92   Resp: 14   Temp: 99 F (37.2 C)   TempSrc: Oral   SpO2: 97%   Weight:  195 lb 12 oz (88.8 kg)  Height:  5\' 9"  (1.753 m)
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
## 2017-01-25 NOTE — ED Notes (Signed)
Pt up and ambulated to the restroom without difficulty

## 2017-01-26 LAB — URINE CULTURE: Culture: <10000 — AB

## 2017-01-27 DIAGNOSIS — R319 Hematuria, unspecified: Secondary | ICD-10-CM | POA: Diagnosis not present

## 2017-01-27 DIAGNOSIS — I482 Chronic atrial fibrillation: Secondary | ICD-10-CM | POA: Diagnosis not present

## 2017-02-23 ENCOUNTER — Ambulatory Visit (INDEPENDENT_AMBULATORY_CARE_PROVIDER_SITE_OTHER): Payer: Medicare Other | Admitting: *Deleted

## 2017-02-23 DIAGNOSIS — I495 Sick sinus syndrome: Secondary | ICD-10-CM | POA: Diagnosis not present

## 2017-02-23 NOTE — Progress Notes (Signed)
Remote pacemaker transmission.   

## 2017-02-24 ENCOUNTER — Encounter: Payer: Self-pay | Admitting: Cardiology

## 2017-02-24 LAB — CUP PACEART REMOTE DEVICE CHECK
Battery Impedance: 351 Ohm
Battery Voltage: 2.79 V
Implantable Lead Implant Date: 20120609
Implantable Lead Location: 753859
Implantable Lead Location: 753860
Implantable Lead Model: 5076
Implantable Pulse Generator Implant Date: 20120609
Lead Channel Pacing Threshold Amplitude: 0.875 V
Lead Channel Pacing Threshold Pulse Width: 0.4 ms
Lead Channel Setting Pacing Amplitude: 2.5 V
Lead Channel Setting Sensing Sensitivity: 4 mV
MDC IDC LEAD IMPLANT DT: 20120609
MDC IDC MSMT BATTERY REMAINING LONGEVITY: 114 mo
MDC IDC MSMT LEADCHNL RA IMPEDANCE VALUE: 67 Ohm
MDC IDC MSMT LEADCHNL RV IMPEDANCE VALUE: 432 Ohm
MDC IDC SESS DTM: 20180515112155
MDC IDC SET LEADCHNL RV PACING PULSEWIDTH: 0.4 ms
MDC IDC STAT BRADY RV PERCENT PACED: 23 %

## 2017-02-25 DIAGNOSIS — I5032 Chronic diastolic (congestive) heart failure: Secondary | ICD-10-CM | POA: Diagnosis not present

## 2017-02-25 DIAGNOSIS — R7303 Prediabetes: Secondary | ICD-10-CM | POA: Diagnosis not present

## 2017-02-25 DIAGNOSIS — K219 Gastro-esophageal reflux disease without esophagitis: Secondary | ICD-10-CM | POA: Diagnosis not present

## 2017-02-25 DIAGNOSIS — I25810 Atherosclerosis of coronary artery bypass graft(s) without angina pectoris: Secondary | ICD-10-CM | POA: Diagnosis not present

## 2017-02-25 DIAGNOSIS — R7309 Other abnormal glucose: Secondary | ICD-10-CM | POA: Diagnosis not present

## 2017-02-25 DIAGNOSIS — C61 Malignant neoplasm of prostate: Secondary | ICD-10-CM | POA: Diagnosis not present

## 2017-02-25 DIAGNOSIS — Z1389 Encounter for screening for other disorder: Secondary | ICD-10-CM | POA: Diagnosis not present

## 2017-02-25 DIAGNOSIS — N183 Chronic kidney disease, stage 3 (moderate): Secondary | ICD-10-CM | POA: Diagnosis not present

## 2017-02-25 DIAGNOSIS — R569 Unspecified convulsions: Secondary | ICD-10-CM | POA: Diagnosis not present

## 2017-02-25 DIAGNOSIS — F324 Major depressive disorder, single episode, in partial remission: Secondary | ICD-10-CM | POA: Diagnosis not present

## 2017-02-25 DIAGNOSIS — I10 Essential (primary) hypertension: Secondary | ICD-10-CM | POA: Diagnosis not present

## 2017-02-25 DIAGNOSIS — M6281 Muscle weakness (generalized): Secondary | ICD-10-CM | POA: Diagnosis not present

## 2017-02-25 DIAGNOSIS — I739 Peripheral vascular disease, unspecified: Secondary | ICD-10-CM | POA: Diagnosis not present

## 2017-02-25 DIAGNOSIS — I69391 Dysphagia following cerebral infarction: Secondary | ICD-10-CM | POA: Diagnosis not present

## 2017-02-25 DIAGNOSIS — F0150 Vascular dementia without behavioral disturbance: Secondary | ICD-10-CM | POA: Diagnosis not present

## 2017-02-25 DIAGNOSIS — Z0000 Encounter for general adult medical examination without abnormal findings: Secondary | ICD-10-CM | POA: Diagnosis not present

## 2017-02-25 DIAGNOSIS — E782 Mixed hyperlipidemia: Secondary | ICD-10-CM | POA: Diagnosis not present

## 2017-02-25 DIAGNOSIS — I482 Chronic atrial fibrillation: Secondary | ICD-10-CM | POA: Diagnosis not present

## 2017-02-25 DIAGNOSIS — Z950 Presence of cardiac pacemaker: Secondary | ICD-10-CM | POA: Diagnosis not present

## 2017-02-25 DIAGNOSIS — M169 Osteoarthritis of hip, unspecified: Secondary | ICD-10-CM | POA: Diagnosis not present

## 2017-03-30 DIAGNOSIS — Z0000 Encounter for general adult medical examination without abnormal findings: Secondary | ICD-10-CM | POA: Diagnosis not present

## 2017-05-11 ENCOUNTER — Other Ambulatory Visit: Payer: Self-pay | Admitting: Dermatology

## 2017-05-11 DIAGNOSIS — C44319 Basal cell carcinoma of skin of other parts of face: Secondary | ICD-10-CM | POA: Diagnosis not present

## 2017-05-11 DIAGNOSIS — L821 Other seborrheic keratosis: Secondary | ICD-10-CM | POA: Diagnosis not present

## 2017-05-11 DIAGNOSIS — L570 Actinic keratosis: Secondary | ICD-10-CM | POA: Diagnosis not present

## 2017-05-13 ENCOUNTER — Inpatient Hospital Stay (HOSPITAL_COMMUNITY): Payer: Medicare Other | Admitting: Certified Registered Nurse Anesthetist

## 2017-05-13 ENCOUNTER — Emergency Department (HOSPITAL_COMMUNITY): Payer: Medicare Other

## 2017-05-13 ENCOUNTER — Encounter (HOSPITAL_COMMUNITY): Payer: Self-pay | Admitting: *Deleted

## 2017-05-13 ENCOUNTER — Encounter (HOSPITAL_COMMUNITY)
Admission: EM | Disposition: A | Discharge status: Skilled Nursing Facility | Payer: Self-pay | Source: Home / Self Care | Attending: Internal Medicine

## 2017-05-13 ENCOUNTER — Inpatient Hospital Stay (HOSPITAL_COMMUNITY): Payer: Medicare Other

## 2017-05-13 ENCOUNTER — Inpatient Hospital Stay (HOSPITAL_COMMUNITY)
Admission: EM | Admit: 2017-05-13 | Discharge: 2017-05-18 | DRG: 871 | Disposition: A | Discharge status: Skilled Nursing Facility | Payer: Medicare Other | Attending: Internal Medicine | Admitting: Internal Medicine

## 2017-05-13 DIAGNOSIS — R509 Fever, unspecified: Secondary | ICD-10-CM

## 2017-05-13 DIAGNOSIS — I69320 Aphasia following cerebral infarction: Secondary | ICD-10-CM | POA: Diagnosis not present

## 2017-05-13 DIAGNOSIS — E860 Dehydration: Secondary | ICD-10-CM | POA: Diagnosis present

## 2017-05-13 DIAGNOSIS — Z87891 Personal history of nicotine dependence: Secondary | ICD-10-CM

## 2017-05-13 DIAGNOSIS — G4733 Obstructive sleep apnea (adult) (pediatric): Secondary | ICD-10-CM | POA: Diagnosis present

## 2017-05-13 DIAGNOSIS — F0391 Unspecified dementia with behavioral disturbance: Secondary | ICD-10-CM | POA: Diagnosis not present

## 2017-05-13 DIAGNOSIS — E876 Hypokalemia: Secondary | ICD-10-CM | POA: Diagnosis present

## 2017-05-13 DIAGNOSIS — I739 Peripheral vascular disease, unspecified: Secondary | ICD-10-CM | POA: Diagnosis present

## 2017-05-13 DIAGNOSIS — Z951 Presence of aortocoronary bypass graft: Secondary | ICD-10-CM | POA: Diagnosis not present

## 2017-05-13 DIAGNOSIS — F0280 Dementia in other diseases classified elsewhere without behavioral disturbance: Secondary | ICD-10-CM | POA: Diagnosis not present

## 2017-05-13 DIAGNOSIS — N139 Obstructive and reflux uropathy, unspecified: Secondary | ICD-10-CM | POA: Diagnosis not present

## 2017-05-13 DIAGNOSIS — R6520 Severe sepsis without septic shock: Secondary | ICD-10-CM

## 2017-05-13 DIAGNOSIS — N1330 Unspecified hydronephrosis: Secondary | ICD-10-CM | POA: Diagnosis not present

## 2017-05-13 DIAGNOSIS — R2689 Other abnormalities of gait and mobility: Secondary | ICD-10-CM | POA: Diagnosis not present

## 2017-05-13 DIAGNOSIS — R748 Abnormal levels of other serum enzymes: Secondary | ICD-10-CM | POA: Diagnosis not present

## 2017-05-13 DIAGNOSIS — N12 Tubulo-interstitial nephritis, not specified as acute or chronic: Secondary | ICD-10-CM | POA: Diagnosis not present

## 2017-05-13 DIAGNOSIS — R600 Localized edema: Secondary | ICD-10-CM | POA: Diagnosis not present

## 2017-05-13 DIAGNOSIS — K219 Gastro-esophageal reflux disease without esophagitis: Secondary | ICD-10-CM | POA: Diagnosis not present

## 2017-05-13 DIAGNOSIS — F329 Major depressive disorder, single episode, unspecified: Secondary | ICD-10-CM | POA: Diagnosis not present

## 2017-05-13 DIAGNOSIS — I714 Abdominal aortic aneurysm, without rupture, unspecified: Secondary | ICD-10-CM

## 2017-05-13 DIAGNOSIS — G9341 Metabolic encephalopathy: Secondary | ICD-10-CM | POA: Diagnosis present

## 2017-05-13 DIAGNOSIS — R278 Other lack of coordination: Secondary | ICD-10-CM | POA: Diagnosis not present

## 2017-05-13 DIAGNOSIS — Z800 Family history of malignant neoplasm of digestive organs: Secondary | ICD-10-CM

## 2017-05-13 DIAGNOSIS — F0150 Vascular dementia without behavioral disturbance: Secondary | ICD-10-CM | POA: Diagnosis present

## 2017-05-13 DIAGNOSIS — E785 Hyperlipidemia, unspecified: Secondary | ICD-10-CM | POA: Diagnosis present

## 2017-05-13 DIAGNOSIS — N132 Hydronephrosis with renal and ureteral calculous obstruction: Secondary | ICD-10-CM

## 2017-05-13 DIAGNOSIS — I6789 Other cerebrovascular disease: Secondary | ICD-10-CM | POA: Diagnosis not present

## 2017-05-13 DIAGNOSIS — J690 Pneumonitis due to inhalation of food and vomit: Secondary | ICD-10-CM | POA: Diagnosis present

## 2017-05-13 DIAGNOSIS — K8020 Calculus of gallbladder without cholecystitis without obstruction: Secondary | ICD-10-CM | POA: Diagnosis not present

## 2017-05-13 DIAGNOSIS — N179 Acute kidney failure, unspecified: Secondary | ICD-10-CM | POA: Diagnosis not present

## 2017-05-13 DIAGNOSIS — C61 Malignant neoplasm of prostate: Secondary | ICD-10-CM | POA: Diagnosis not present

## 2017-05-13 DIAGNOSIS — Z7951 Long term (current) use of inhaled steroids: Secondary | ICD-10-CM

## 2017-05-13 DIAGNOSIS — N136 Pyonephrosis: Secondary | ICD-10-CM | POA: Diagnosis present

## 2017-05-13 DIAGNOSIS — R531 Weakness: Secondary | ICD-10-CM | POA: Diagnosis not present

## 2017-05-13 DIAGNOSIS — Z8041 Family history of malignant neoplasm of ovary: Secondary | ICD-10-CM | POA: Diagnosis not present

## 2017-05-13 DIAGNOSIS — Z950 Presence of cardiac pacemaker: Secondary | ICD-10-CM

## 2017-05-13 DIAGNOSIS — Z7901 Long term (current) use of anticoagulants: Secondary | ICD-10-CM | POA: Diagnosis not present

## 2017-05-13 DIAGNOSIS — N10 Acute tubulo-interstitial nephritis: Secondary | ICD-10-CM

## 2017-05-13 DIAGNOSIS — M6281 Muscle weakness (generalized): Secondary | ICD-10-CM | POA: Diagnosis not present

## 2017-05-13 DIAGNOSIS — I10 Essential (primary) hypertension: Secondary | ICD-10-CM | POA: Diagnosis present

## 2017-05-13 DIAGNOSIS — J189 Pneumonia, unspecified organism: Secondary | ICD-10-CM | POA: Diagnosis not present

## 2017-05-13 DIAGNOSIS — J168 Pneumonia due to other specified infectious organisms: Secondary | ICD-10-CM | POA: Diagnosis not present

## 2017-05-13 DIAGNOSIS — A419 Sepsis, unspecified organism: Secondary | ICD-10-CM | POA: Diagnosis not present

## 2017-05-13 DIAGNOSIS — N131 Hydronephrosis with ureteral stricture, not elsewhere classified: Secondary | ICD-10-CM

## 2017-05-13 DIAGNOSIS — I2510 Atherosclerotic heart disease of native coronary artery without angina pectoris: Secondary | ICD-10-CM | POA: Diagnosis present

## 2017-05-13 DIAGNOSIS — R05 Cough: Secondary | ICD-10-CM | POA: Diagnosis not present

## 2017-05-13 DIAGNOSIS — I482 Chronic atrial fibrillation: Secondary | ICD-10-CM | POA: Diagnosis present

## 2017-05-13 DIAGNOSIS — N201 Calculus of ureter: Secondary | ICD-10-CM | POA: Diagnosis not present

## 2017-05-13 DIAGNOSIS — R410 Disorientation, unspecified: Secondary | ICD-10-CM | POA: Diagnosis not present

## 2017-05-13 DIAGNOSIS — Z8546 Personal history of malignant neoplasm of prostate: Secondary | ICD-10-CM | POA: Diagnosis not present

## 2017-05-13 DIAGNOSIS — R7989 Other specified abnormal findings of blood chemistry: Secondary | ICD-10-CM

## 2017-05-13 DIAGNOSIS — Z79899 Other long term (current) drug therapy: Secondary | ICD-10-CM

## 2017-05-13 DIAGNOSIS — Z923 Personal history of irradiation: Secondary | ICD-10-CM

## 2017-05-13 DIAGNOSIS — I4891 Unspecified atrial fibrillation: Secondary | ICD-10-CM | POA: Diagnosis not present

## 2017-05-13 DIAGNOSIS — N200 Calculus of kidney: Secondary | ICD-10-CM | POA: Diagnosis not present

## 2017-05-13 DIAGNOSIS — Z8673 Personal history of transient ischemic attack (TIA), and cerebral infarction without residual deficits: Secondary | ICD-10-CM | POA: Diagnosis not present

## 2017-05-13 DIAGNOSIS — R4182 Altered mental status, unspecified: Secondary | ICD-10-CM | POA: Diagnosis not present

## 2017-05-13 DIAGNOSIS — J449 Chronic obstructive pulmonary disease, unspecified: Secondary | ICD-10-CM | POA: Diagnosis not present

## 2017-05-13 DIAGNOSIS — R778 Other specified abnormalities of plasma proteins: Secondary | ICD-10-CM

## 2017-05-13 DIAGNOSIS — Z880 Allergy status to penicillin: Secondary | ICD-10-CM | POA: Diagnosis not present

## 2017-05-13 HISTORY — PX: CYSTOSCOPY W/ URETERAL STENT PLACEMENT: SHX1429

## 2017-05-13 LAB — COMPREHENSIVE METABOLIC PANEL
ALT: 14 U/L — AB (ref 17–63)
AST: 18 U/L (ref 15–41)
Albumin: 3.4 g/dL — ABNORMAL LOW (ref 3.5–5.0)
Alkaline Phosphatase: 77 U/L (ref 38–126)
Anion gap: 9 (ref 5–15)
BILIRUBIN TOTAL: 0.8 mg/dL (ref 0.3–1.2)
BUN: 30 mg/dL — AB (ref 6–20)
CO2: 26 mmol/L (ref 22–32)
CREATININE: 1.87 mg/dL — AB (ref 0.61–1.24)
Calcium: 8.7 mg/dL — ABNORMAL LOW (ref 8.9–10.3)
Chloride: 106 mmol/L (ref 101–111)
GFR, EST AFRICAN AMERICAN: 37 mL/min — AB (ref >60)
GFR, EST NON AFRICAN AMERICAN: 32 mL/min — AB (ref >60)
Glucose, Bld: 139 mg/dL — ABNORMAL HIGH (ref 65–99)
POTASSIUM: 3.3 mmol/L — AB (ref 3.5–5.1)
Sodium: 141 mmol/L (ref 135–145)
TOTAL PROTEIN: 6.9 g/dL (ref 6.5–8.1)

## 2017-05-13 LAB — CBC WITH DIFFERENTIAL/PLATELET
BASOS PCT: 0 %
Basophils Absolute: 0 10*3/uL (ref 0.0–0.1)
EOS ABS: 0 10*3/uL (ref 0.0–0.7)
EOS PCT: 0 %
HCT: 41.8 % (ref 39.0–52.0)
HEMOGLOBIN: 13.2 g/dL (ref 13.0–17.0)
LYMPHS ABS: 0.7 10*3/uL (ref 0.7–4.0)
Lymphocytes Relative: 4 %
MCH: 30.2 pg (ref 26.0–34.0)
MCHC: 31.6 g/dL (ref 30.0–36.0)
MCV: 95.7 fL (ref 78.0–100.0)
MONOS PCT: 8 %
Monocytes Absolute: 1.5 10*3/uL — ABNORMAL HIGH (ref 0.1–1.0)
NEUTROS PCT: 88 %
Neutro Abs: 15.8 10*3/uL — ABNORMAL HIGH (ref 1.7–7.7)
PLATELETS: 188 10*3/uL (ref 150–400)
RBC: 4.37 MIL/uL (ref 4.22–5.81)
RDW: 14 % (ref 11.5–15.5)
WBC: 18 10*3/uL — ABNORMAL HIGH (ref 4.0–10.5)

## 2017-05-13 LAB — URINALYSIS, ROUTINE W REFLEX MICROSCOPIC
Bilirubin Urine: NEGATIVE
GLUCOSE, UA: NEGATIVE mg/dL
KETONES UR: NEGATIVE mg/dL
Leukocytes, UA: NEGATIVE
Nitrite: NEGATIVE
PROTEIN: NEGATIVE mg/dL
SQUAMOUS EPITHELIAL / LPF: NONE SEEN
Specific Gravity, Urine: 1.014 (ref 1.005–1.030)
pH: 5 (ref 5.0–8.0)

## 2017-05-13 LAB — CBC
HEMATOCRIT: 39.5 % (ref 39.0–52.0)
Hemoglobin: 12.6 g/dL — ABNORMAL LOW (ref 13.0–17.0)
MCH: 29.4 pg (ref 26.0–34.0)
MCHC: 31.9 g/dL (ref 30.0–36.0)
MCV: 92.3 fL (ref 78.0–100.0)
Platelets: 180 10*3/uL (ref 150–400)
RBC: 4.28 MIL/uL (ref 4.22–5.81)
RDW: 14 % (ref 11.5–15.5)
WBC: 16.5 10*3/uL — AB (ref 4.0–10.5)

## 2017-05-13 LAB — LACTIC ACID, PLASMA
LACTIC ACID, VENOUS: 1.6 mmol/L (ref 0.5–1.9)
Lactic Acid, Venous: 0.8 mmol/L (ref 0.5–1.9)

## 2017-05-13 LAB — CREATININE, SERUM
Creatinine, Ser: 1.94 mg/dL — ABNORMAL HIGH (ref 0.61–1.24)
GFR calc non Af Amer: 30 mL/min — ABNORMAL LOW (ref >60)
GFR, EST AFRICAN AMERICAN: 35 mL/min — AB (ref >60)

## 2017-05-13 LAB — LIPASE, BLOOD: LIPASE: 22 U/L (ref 11–51)

## 2017-05-13 LAB — TROPONIN I: TROPONIN I: 0.03 ng/mL — AB (ref <0.03)

## 2017-05-13 LAB — BRAIN NATRIURETIC PEPTIDE: B Natriuretic Peptide: 258 pg/mL — ABNORMAL HIGH (ref 0.0–100.0)

## 2017-05-13 SURGERY — CYSTOSCOPY, WITH RETROGRADE PYELOGRAM AND URETERAL STENT INSERTION
Anesthesia: General | Site: Ureter | Laterality: Left

## 2017-05-13 MED ORDER — ACETAMINOPHEN 650 MG RE SUPP: 650.0000 mg | Freq: Four times a day (QID) | RECTAL | Status: DC | PRN

## 2017-05-13 MED ORDER — DILTIAZEM HCL ER COATED BEADS 180 MG PO CP24
180.0000 mg | ORAL_CAPSULE | Freq: Two times a day (BID) | ORAL | Status: DC
  Administered 2017-05-13 – 2017-05-18 (×10): 180 mg via ORAL
  Filled 2017-05-13 (×10): qty 1

## 2017-05-13 MED ORDER — SODIUM CHLORIDE 0.9 % IV SOLN
INTRAVENOUS | Status: DC
  Administered 2017-05-13: 10:00:00 via INTRAVENOUS

## 2017-05-13 MED ORDER — ONDANSETRON HCL 4 MG/2ML IJ SOLN
INTRAMUSCULAR | Status: DC | PRN
  Administered 2017-05-13: 4 mg via INTRAVENOUS

## 2017-05-13 MED ORDER — LIDOCAINE 2% (20 MG/ML) 5 ML SYRINGE
INTRAMUSCULAR | Status: AC
  Filled 2017-05-13: qty 5

## 2017-05-13 MED ORDER — GUAIFENESIN ER 600 MG PO TB12
600.0000 mg | ORAL_TABLET | Freq: Two times a day (BID) | ORAL | Status: DC
  Administered 2017-05-13 – 2017-05-18 (×10): 600 mg via ORAL
  Filled 2017-05-13 (×10): qty 1

## 2017-05-13 MED ORDER — ONDANSETRON HCL 4 MG/2ML IJ SOLN
INTRAMUSCULAR | Status: AC
  Filled 2017-05-13: qty 2

## 2017-05-13 MED ORDER — ENOXAPARIN SODIUM 40 MG/0.4ML ~~LOC~~ SOLN
40.0000 mg | SUBCUTANEOUS | Status: DC
  Administered 2017-05-13: 40 mg via SUBCUTANEOUS
  Filled 2017-05-13: qty 0.4

## 2017-05-13 MED ORDER — PHENYLEPHRINE HCL 10 MG/ML IJ SOLN
INTRAMUSCULAR | Status: DC | PRN
  Administered 2017-05-13: 80 ug via INTRAVENOUS

## 2017-05-13 MED ORDER — OMEPRAZOLE 20 MG PO CPDR
20.0000 mg | DELAYED_RELEASE_CAPSULE | Freq: Two times a day (BID) | ORAL | Status: DC
  Administered 2017-05-13 – 2017-05-18 (×10): 20 mg via ORAL
  Filled 2017-05-13 (×11): qty 1

## 2017-05-13 MED ORDER — FENTANYL CITRATE (PF) 100 MCG/2ML IJ SOLN
INTRAMUSCULAR | Status: DC | PRN
  Administered 2017-05-13 (×2): 25 ug via INTRAVENOUS
  Administered 2017-05-13: 50 ug via INTRAVENOUS

## 2017-05-13 MED ORDER — DEXTROSE-NACL 5-0.9 % IV SOLN
INTRAVENOUS | Status: DC
  Administered 2017-05-13 – 2017-05-15 (×3): via INTRAVENOUS

## 2017-05-13 MED ORDER — SODIUM CHLORIDE 0.9 % IV BOLUS (SEPSIS)
250.0000 mL | Freq: Once | INTRAVENOUS | Status: AC
  Administered 2017-05-13: 250 mL via INTRAVENOUS

## 2017-05-13 MED ORDER — ONDANSETRON HCL 4 MG/2ML IJ SOLN
4.0000 mg | Freq: Once | INTRAMUSCULAR | Status: DC | PRN
Start: 2017-05-13 — End: 2017-05-13

## 2017-05-13 MED ORDER — LEVOFLOXACIN IN D5W 750 MG/150ML IV SOLN
750.0000 mg | INTRAVENOUS | Status: DC
  Administered 2017-05-15: 750 mg via INTRAVENOUS
  Filled 2017-05-13: qty 150

## 2017-05-13 MED ORDER — OMEPRAZOLE MAGNESIUM 20 MG PO TBEC: 20.0000 mg | DELAYED_RELEASE_TABLET | Freq: Two times a day (BID) | ORAL | Status: DC

## 2017-05-13 MED ORDER — CENTRUM PO CHEW
1.0000 | CHEWABLE_TABLET | Freq: Every day | ORAL | Status: DC
  Administered 2017-05-14 – 2017-05-18 (×5): 1 via ORAL
  Filled 2017-05-13 (×6): qty 1

## 2017-05-13 MED ORDER — LEVOFLOXACIN IN D5W 750 MG/150ML IV SOLN
750.0000 mg | Freq: Once | INTRAVENOUS | Status: AC
  Administered 2017-05-13: 750 mg via INTRAVENOUS
  Filled 2017-05-13: qty 150

## 2017-05-13 MED ORDER — SODIUM CHLORIDE 0.9 % IR SOLN
Status: DC | PRN
  Administered 2017-05-13: 4000 mL

## 2017-05-13 MED ORDER — ACETAMINOPHEN 325 MG PO TABS
650.0000 mg | ORAL_TABLET | Freq: Four times a day (QID) | ORAL | Status: DC | PRN
  Administered 2017-05-14 – 2017-05-17 (×3): 650 mg via ORAL
  Filled 2017-05-13 (×3): qty 2

## 2017-05-13 MED ORDER — BUDESONIDE-FORMOTEROL FUMARATE 80-4.5 MCG/ACT IN AERO
2.0000 | INHALATION_SPRAY | Freq: Every day | RESPIRATORY_TRACT | Status: DC
  Administered 2017-05-13 – 2017-05-18 (×6): 2 via RESPIRATORY_TRACT
  Filled 2017-05-13: qty 6.9

## 2017-05-13 MED ORDER — FENTANYL CITRATE (PF) 100 MCG/2ML IJ SOLN: 25.0000 ug | INTRAMUSCULAR | Status: DC | PRN

## 2017-05-13 MED ORDER — LACTATED RINGERS IV SOLN
INTRAVENOUS | Status: DC | PRN
  Administered 2017-05-13: 18:00:00 via INTRAVENOUS

## 2017-05-13 MED ORDER — ATORVASTATIN CALCIUM 40 MG PO TABS
40.0000 mg | ORAL_TABLET | Freq: Every day | ORAL | Status: DC
  Administered 2017-05-14 – 2017-05-17 (×4): 40 mg via ORAL
  Filled 2017-05-13 (×4): qty 1

## 2017-05-13 MED ORDER — PROPOFOL 10 MG/ML IV BOLUS
INTRAVENOUS | Status: AC
  Filled 2017-05-13: qty 20

## 2017-05-13 MED ORDER — LEVOFLOXACIN IN D5W 750 MG/150ML IV SOLN: 750.0000 mg | Freq: Once | INTRAVENOUS | Status: DC

## 2017-05-13 MED ORDER — MULTI-VITAMIN/MINERALS PO TABS: 1.0000 | ORAL_TABLET | Freq: Every day | ORAL | Status: DC

## 2017-05-13 MED ORDER — ACETAMINOPHEN 325 MG PO TABS: 650.0000 mg | ORAL_TABLET | Freq: Once | ORAL | Status: DC

## 2017-05-13 MED ORDER — TOPIRAMATE 25 MG PO TABS
50.0000 mg | ORAL_TABLET | Freq: Two times a day (BID) | ORAL | Status: DC
  Administered 2017-05-13 – 2017-05-18 (×10): 50 mg via ORAL
  Filled 2017-05-13 (×10): qty 2

## 2017-05-13 MED ORDER — ACETAMINOPHEN 650 MG RE SUPP
650.0000 mg | Freq: Once | RECTAL | Status: AC
  Administered 2017-05-13: 650 mg via RECTAL
  Filled 2017-05-13: qty 1

## 2017-05-13 MED ORDER — PROPOFOL 10 MG/ML IV BOLUS
INTRAVENOUS | Status: DC | PRN
  Administered 2017-05-13: 150 mg via INTRAVENOUS
  Administered 2017-05-13: 50 mg via INTRAVENOUS

## 2017-05-13 MED ORDER — 0.9 % SODIUM CHLORIDE (POUR BTL) OPTIME
TOPICAL | Status: DC | PRN
Start: 2017-05-13 — End: 2017-05-13
  Administered 2017-05-13: 1000 mL

## 2017-05-13 MED ORDER — PHENYLEPHRINE 40 MCG/ML (10ML) SYRINGE FOR IV PUSH (FOR BLOOD PRESSURE SUPPORT)
PREFILLED_SYRINGE | INTRAVENOUS | Status: AC
  Filled 2017-05-13: qty 10

## 2017-05-13 MED ORDER — FLUOXETINE HCL 20 MG PO CAPS
40.0000 mg | ORAL_CAPSULE | Freq: Every day | ORAL | Status: DC
  Administered 2017-05-14 – 2017-05-18 (×5): 40 mg via ORAL
  Filled 2017-05-13 (×5): qty 2

## 2017-05-13 MED ORDER — IOHEXOL 300 MG/ML  SOLN
INTRAMUSCULAR | Status: DC | PRN
  Administered 2017-05-13: 10 mL

## 2017-05-13 MED ORDER — FENTANYL CITRATE (PF) 100 MCG/2ML IJ SOLN
INTRAMUSCULAR | Status: AC
  Filled 2017-05-13: qty 2

## 2017-05-13 MED ORDER — LIDOCAINE HCL (CARDIAC) 20 MG/ML IV SOLN
INTRAVENOUS | Status: DC | PRN
  Administered 2017-05-13: 100 mg via INTRAVENOUS

## 2017-05-13 SURGICAL SUPPLY — 16 items
BAG URO CATCHER STRL LF (MISCELLANEOUS) ×3 IMPLANT
BASKET ZERO TIP NITINOL 2.4FR (BASKET) IMPLANT
BSKT STON RTRVL ZERO TP 2.4FR (BASKET)
CATH INTERMIT  6FR 70CM (CATHETERS) ×3 IMPLANT
CLOTH BEACON ORANGE TIMEOUT ST (SAFETY) ×3 IMPLANT
COVER SURGICAL LIGHT HANDLE (MISCELLANEOUS) ×3 IMPLANT
GLOVE BIOGEL M STRL SZ7.5 (GLOVE) ×3 IMPLANT
GOWN STRL REUS W/TWL LRG LVL3 (GOWN DISPOSABLE) ×6 IMPLANT
GUIDEWIRE ANG ZIPWIRE 038X150 (WIRE) ×2 IMPLANT
GUIDEWIRE STR DUAL SENSOR (WIRE) ×3 IMPLANT
MANIFOLD NEPTUNE II (INSTRUMENTS) ×3 IMPLANT
PACK CYSTO (CUSTOM PROCEDURE TRAY) ×3 IMPLANT
STENT URET 6FRX26 CONTOUR (STENTS) ×2 IMPLANT
TRAY FOLEY CATH 16FRSI W/METER (SET/KITS/TRAYS/PACK) ×3 IMPLANT
TUBING CONNECTING 10 (TUBING) ×2 IMPLANT
TUBING CONNECTING 10' (TUBING) ×1

## 2017-05-13 NOTE — H&P (View-Only) (Signed)
Reason for Consult: Left Ureteral Stone, Sepsis of Possible Urinary Source, Acute Renal Failure.   Referring Physician: Sander Radon MD  Zachary Warren is an 83 y.o. male.   HPI:   1 - Left Ureteral Stone - 1cm left mid ureteral stone by ER CT on eval abdominal pain and fevers. Stone is solitary, 950HU. Minimal perinephric stranding. He is on topamax.   2 - Sepsis of Possible Urinary Source - Fevers, luekocytosis, malaise x several days prompting ER eval 05/2017. Pneumonia noted. UA without pyuria, no nitrites, no sig bacteruria. Obstructing stone as per above. BCX, UCX 8/2 pending.  Placed on empiric Levaquin.   3 -  Acute Renal Failure - Baseline Cr < 1.0- with rise to 1.8 noted on ER labs 05/2017. Family admits to poor PO intake, left ureteral stone as per above.  PMH sig for CAD/CABG/Pacer/CHF, CVA with aphasia (well adapted),   Today "Zachary Warren" is seen in consultation for above.    Past Medical History:  Diagnosis Date  . Coronary artery disease    s/p 5v CABG 2002  . CVA (cerebral infarction) 1999  . DDD (degenerative disc disease), cervical   . Depression   . GERD (gastroesophageal reflux disease)   . Hyperlipidemia   . Hypertension   . Memory impairment   . OSA (obstructive sleep apnea)    Recently diagnosed though pt. has not had CPAP trial  . Pacemaker   . Persistent atrial fibrillation ([place])    on pradaxa  . Prostate cancer ([place]) 2006   s/p XRT  . PVD (peripheral vascular disease) ([place])   . Seizures ([place])   . Tachycardia-bradycardia [facility])    s/p PPM by [facility] 6/12    Past Surgical History:  Procedure Laterality Date  . CARDIOVERSION N/A 08/21/2013   Procedure: CARDIOVERSION;  Surgeon: Candee Furbish, MD;  Location: [place];  Service: Cardiovascular;  Laterality: N/A;  . [place]  . CORONARY ARTERY BYPASS GRAFT  2002  . INSERT / REPLACE / REMOVE PACEMAKER    . PACEMAKER INSERTION     implanted by JA 6/12    Family History  Problem Relation Age of Onset   . Cancer Mother        ovarian  . Ovarian cancer Mother   . Cancer Father        pancreatic  . Pancreatic cancer Father   . Cancer Brother        lung  . Ovarian cancer Unknown   . Pancreatic cancer Unknown   . Lung cancer Unknown     Social History:  reports that he quit smoking about 19 years ago. His smoking use included Cigarettes. He has a 96.00 pack-year smoking history. He has never used smokeless tobacco. He reports that he does not drink alcohol or use drugs.  Allergies:  Allergies  Allergen Reactions  . Aricept [Donepezil Hcl] Other (See Comments)    "stomach pain", generic  . Ceclor [Cefaclor]   . Namenda [Memantine Hcl] Other (See Comments)    "dizzy", brand medication  . Ramipril Other (See Comments)     cough    Medications: I have reviewed the patient's current medications.  Results for orders placed or performed during the hospital encounter of 05/13/17 (from the past 48 hour(s))  Comprehensive metabolic panel     Status: Abnormal   Collection Time: 05/13/17  9:40 AM  Result Value Ref Range   Sodium 141 135 - 145 mmol/L   Potassium 3.3 (L) 3.5 -  5.1 mmol/L   Chloride 106 101 - 111 mmol/L   CO2 26 22 - 32 mmol/L   Glucose, Bld 139 (H) 65 - 99 mg/dL   BUN 30 (H) 6 - 20 mg/dL   Creatinine, Ser 1.87 (H) 0.61 - 1.24 mg/dL   Calcium 8.7 (L) 8.9 - 10.3 mg/dL   Total Protein 6.9 6.5 - 8.1 g/dL   Albumin 3.4 (L) 3.5 - 5.0 g/dL   AST 18 15 - 41 U/L   ALT 14 (L) 17 - 63 U/L   Alkaline Phosphatase 77 38 - 126 U/L   Total Bilirubin 0.8 0.3 - 1.2 mg/dL   GFR calc non Af Amer 32 (L) >60 mL/min   GFR calc Af Amer 37 (L) >60 mL/min    Comment: (NOTE) The eGFR has been calculated using the CKD EPI equation. This calculation has not been validated in all clinical situations. eGFR's persistently <60 mL/min signify possible Chronic Kidney Disease.    Anion gap 9 5 - 15  Troponin I     Status: Abnormal   Collection Time: 05/13/17  9:40 AM  Result Value Ref Range    Troponin I 0.03 (HH) <0.03 ng/mL    Comment: CRITICAL RESULT CALLED TO, READ BACK BY AND VERIFIED WITH: WALLACE L. AT 1050A ON 250037 BY THOMPSON S.   Lactic acid, plasma     Status: None   Collection Time: 05/13/17  9:40 AM  Result Value Ref Range   Lactic Acid, Venous 1.6 0.5 - 1.9 mmol/L  CBC with Differential     Status: Abnormal   Collection Time: 05/13/17  9:40 AM  Result Value Ref Range   WBC 18.0 (H) 4.0 - 10.5 K/uL   RBC 4.37 4.22 - 5.81 MIL/uL   Hemoglobin 13.2 13.0 - 17.0 g/dL   HCT 41.8 39.0 - 52.0 %   MCV 95.7 78.0 - 100.0 fL   MCH 30.2 26.0 - 34.0 pg   MCHC 31.6 30.0 - 36.0 g/dL   RDW 14.0 11.5 - 15.5 %   Platelets 188 150 - 400 K/uL   Neutrophils Relative % 88 %   Neutro Abs 15.8 (H) 1.7 - 7.7 K/uL   Lymphocytes Relative 4 %   Lymphs Abs 0.7 0.7 - 4.0 K/uL   Monocytes Relative 8 %   Monocytes Absolute 1.5 (H) 0.1 - 1.0 K/uL   Eosinophils Relative 0 %   Eosinophils Absolute 0.0 0.0 - 0.7 K/uL   Basophils Relative 0 %   Basophils Absolute 0.0 0.0 - 0.1 K/uL  Lipase, blood     Status: None   Collection Time: 05/13/17  9:40 AM  Result Value Ref Range   Lipase 22 11 - 51 U/L  Brain natriuretic peptide     Status: Abnormal   Collection Time: 05/13/17  9:40 AM  Result Value Ref Range   B Natriuretic Peptide 258.0 (H) 0.0 - 100.0 pg/mL  Urinalysis, Routine w reflex microscopic     Status: Abnormal   Collection Time: 05/13/17 10:03 AM  Result Value Ref Range   Color, Urine YELLOW YELLOW   APPearance HAZY (A) CLEAR   Specific Gravity, Urine 1.014 1.005 - 1.030   pH 5.0 5.0 - 8.0   Glucose, UA NEGATIVE NEGATIVE mg/dL   Hgb urine dipstick MODERATE (A) NEGATIVE   Bilirubin Urine NEGATIVE NEGATIVE   Ketones, ur NEGATIVE NEGATIVE mg/dL   Protein, ur NEGATIVE NEGATIVE mg/dL   Nitrite NEGATIVE NEGATIVE   Leukocytes, UA NEGATIVE NEGATIVE   RBC /  HPF 6-30 0 - 5 RBC/hpf   WBC, UA 0-5 0 - 5 WBC/hpf   Bacteria, UA RARE (A) NONE SEEN   Squamous Epithelial / LPF NONE  SEEN NONE SEEN   Mucous PRESENT    Hyaline Casts, UA PRESENT    Granular Casts, UA PRESENT   Culture, blood (routine x 2)     Status: None (Preliminary result)   Collection Time: 05/13/17 12:25 PM  Result Value Ref Range   Specimen Description LEFT ANTECUBITAL    Special Requests      BOTTLES DRAWN AEROBIC AND ANAEROBIC Blood Culture adequate volume   Culture PENDING    Report Status PENDING   Lactic acid, plasma     Status: None   Collection Time: 05/13/17 12:28 PM  Result Value Ref Range   Lactic Acid, Venous 0.8 0.5 - 1.9 mmol/L  Culture, blood (routine x 2)     Status: None (Preliminary result)   Collection Time: 05/13/17 12:33 PM  Result Value Ref Range   Specimen Description BLOOD RIGHT HAND    Special Requests      BOTTLES DRAWN AEROBIC AND ANAEROBIC Blood Culture adequate volume   Culture PENDING    Report Status PENDING     Ct Abdomen Pelvis Wo Contrast  Addendum Date: 05/13/2017   ADDENDUM REPORT: 05/13/2017 11:41 ADDENDUM: Aortic aneurysm NOS (ICD10-I71.9). Electronically Signed   By: Lowella Grip III M.D.   On: 05/13/2017 11:41   Result Date: 05/13/2017 CLINICAL DATA:  Loss of appetite. Weakness. Prostate carcinoma. Abdominal pain. EXAM: CT ABDOMEN AND PELVIS WITHOUT CONTRAST TECHNIQUE: Multidetector CT imaging of the abdomen and pelvis was performed following the standard protocol without and contrast material administration. COMPARISON:  None. FINDINGS: Lower chest: There is bibasilar lung scarring. There is patchy airspace opacity in the left lower lobe consistent with pneumonia. Pacemaker leads are attached to the right atrium and right ventricle. There is a small hiatal hernia. Hepatobiliary: No focal liver lesions are evident on this noncontrast enhanced study. There are foci of calcification and peripheral hepatic artery vessels anteriorly. There are multiple gallstones in the gallbladder. Gallbladder wall does not appear appreciably thickened. There is no biliary  duct dilatation. Pancreas: No pancreatic mass or inflammatory focus. Spleen: No splenic lesions are evident. Adrenals/Urinary Tract: Adrenals appear unremarkable bilaterally. There is no appreciable renal mass on either side. There is moderately severe hydronephrosis on the left. No hydronephrosis on the right. There is a 1 mm calculus in the lower pole the right kidney, nonobstructing. There are several 1 mm calculi in the lower pole left kidney, nonobstructing. There is a calculus in the left ureter at the upper acetabular level measuring 1.0 x 0.8 x 0.6 cm. No other ureteral calculi are evident. Urinary bladder is midline. Urinary bladder wall is borderline thickened. Stomach/Bowel: There is no appreciable bowel wall or mesenteric thickening. There is moderate stool in the colon. No bowel obstruction. No free air or portal venous air. No bowel pneumatosis. Vascular/Lymphatic: There is atherosclerotic calcification throughout the aorta and iliac arterial vessels as well as extending into both common femoral and superficial femoral arteries. Aneurysm dilatation of the mid the distal abdominal aorta is noted with a maximum transverse diameter of 3.3 x 3.2 cm. No periaortic fluid. There is extensive calcification in the major mesenteric vessels. Calcification is marked in the proximal right renal artery. Both common and external iliac arteries are tortuous. There is no appreciable adenopathy in the abdomen or pelvis. Reproductive: There are seed implants in  the prostate. The prostatic size and contour normal. Seminal vesicles appear normal. Note that the right testis is located in the inferior right inguinal ring. Other: No periappendiceal region inflammation. No appreciable abscess or ascites is evident in the abdomen or pelvis. There is a rather minimal ventral hernia containing only fat. Musculoskeletal: There is extensive degenerative change in the lumbar spine. There are no blastic or lytic bone lesions. No  intramuscular or abdominal wall lesion evident. IMPRESSION: 1. There is a calculus in the left ureter at the upper acetabulum level measuring 1.0 x 0.8 x 0.6 cm with moderately severe hydronephrosis on the left. There are small nonobstructing calculi in each kidney. 2. Slight urinary bladder wall thickening, potentially due to inflammation secondary to seed implants in the prostate adjacent to the bladder. 3. Airspace consolidation left lower lobe consistent with pneumonia. 4. Cholelithiasis. Gallbladder wall does not appear appreciably thickened on this study. 5. Mid and distal abdominal aortic aneurysm with maximum transverse diameter of 3.3 x 3.2 cm. Recommend followup by ultrasound in 3 years. This recommendation follows ACR consensus guidelines: White Paper of the ACR Incidental Findings Committee II on Vascular Findings. J Am Coll Radiol 2013; 10:789-794. 6. Extensive atherosclerotic calcification throughout the aorta and major pelvic arterial vessels as well as major mesenteric vessels. Marked atherosclerotic calcification in the proximal right renal artery. In this regard, question whether patient is hypertensive. 7. Right testis in distal inguinal ring. No complicating features in this area. 8. Small hiatal hernia. Minimal ventral hernia containing only fat. Aortic Atherosclerosis (ICD10-I70.0). Electronically Signed: By: Lowella Grip III M.D. On: 05/13/2017 11:35   Dg Chest 2 View  Result Date: 05/13/2017 CLINICAL DATA:  Weakness and cough.  Nausea vomiting EXAM: CHEST  2 VIEW COMPARISON:  06/16/2016 FINDINGS: Mild cardiac enlargement. Dual lead pacemaker unchanged. CABG. Negative for heart failure. Left lower lobe airspace disease has progressed since the study of 06/16/2016. There appears to have been pneumonia in this area on earlier studies. This could represent recurrent pneumonia or progressive scarring. Negative for pleural effusion. Underlying COPD. IMPRESSION: COPD Left lower lobe airspace  disease may represent recurrent pneumonia Electronically Signed   By: Franchot Gallo M.D.   On: 05/13/2017 10:50   Ct Head Wo Contrast  Result Date: 05/13/2017 CLINICAL DATA:  Weakness, confusion. EXAM: CT HEAD WITHOUT CONTRAST TECHNIQUE: Contiguous axial images were obtained from the base of the skull through the vertex without intravenous contrast. COMPARISON:  CT scan of October 10, 2014. FINDINGS: Brain: Mild diffuse cortical atrophy is noted. Stable left frontal encephalomalacia is noted consistent with old infarction. No mass effect or midline shift is noted. Ventricular size is within normal limits. There is no evidence of mass lesion, hemorrhage or acute infarction. Vascular: No hyperdense vessel or unexpected calcification. Skull: Normal. Negative for fracture or focal lesion. Sinuses/Orbits: No acute finding. Other: None. IMPRESSION: Mild diffuse cortical atrophy. Stable old left frontal infarction. No acute intracranial abnormality seen. Electronically Signed   By: Marijo Conception, M.D.   On: 05/13/2017 11:29   US Venous Img Lower Unilateral Right  Result Date: 05/13/2017 CLINICAL DATA:  Right lower extremity edema. EXAM: RIGHT LOWER EXTREMITY VENOUS DOPPLER ULTRASOUND TECHNIQUE: Gray-scale sonography with graded compression, as well as color Doppler and duplex ultrasound were performed to evaluate the lower extremity deep venous systems from the level of the common femoral vein and including the common femoral, femoral, profunda femoral, popliteal and calf veins including the posterior tibial, peroneal and gastrocnemius veins when visible.  The superficial great saphenous vein was also interrogated. Spectral Doppler was utilized to evaluate flow at rest and with distal augmentation maneuvers in the common femoral, femoral and popliteal veins. COMPARISON:  None. FINDINGS: Contralateral Common Femoral Vein: Respiratory phasicity is normal and symmetric with the symptomatic side. No evidence of  thrombus. Normal compressibility. Common Femoral Vein: No evidence of thrombus. Normal compressibility, respiratory phasicity and response to augmentation. Saphenofemoral Junction: No evidence of thrombus. Normal compressibility and flow on color Doppler imaging. Profunda Femoral Vein: No evidence of thrombus. Normal compressibility and flow on color Doppler imaging. Femoral Vein: No evidence of thrombus. Normal compressibility, respiratory phasicity and response to augmentation. Popliteal Vein: No evidence of thrombus. Normal compressibility, respiratory phasicity and response to augmentation. Calf Veins: No evidence of thrombus. Normal compressibility and flow on color Doppler imaging. Other Findings:  None. IMPRESSION: No evidence of DVT within the right lower extremity. Electronically Signed   By: Marcello Moores  Register   On: 05/13/2017 10:37    Review of Systems  Constitutional: Positive for chills, fever and malaise/fatigue.  HENT: Positive for hearing loss.   Eyes: Negative.   Respiratory: Negative.   Cardiovascular: Negative.   Gastrointestinal: Positive for abdominal pain and nausea.  Genitourinary: Negative.   Musculoskeletal: Negative.   Skin: Negative.   Neurological: Positive for speech change.  Endo/Heme/Allergies: Negative.   Psychiatric/Behavioral: Negative.    Blood pressure 121/80, pulse (!) 110, temperature (!) 101.5 F (38.6 C), temperature source Rectal, resp. rate (!) 26, height '5\' 9"'  (1.753 m), weight 89.8 kg (198 lb), SpO2 92 %. Physical Exam  Constitutional: He appears well-developed.  Stigmata of CVA, but well adapted. Wife at bedside.   HENT:  Head: Normocephalic.  Eyes: Pupils are equal, round, and reactive to light.  Neck: Normal range of motion.  Cardiovascular:  Regular tachycardia by bedside monitor  Respiratory: Effort normal.  GI: Soft.  Genitourinary:  Genitourinary Comments: Minimal CVAT at present.   Musculoskeletal: Normal range of motion.   Neurological: He is alert.  Skin: Skin is warm.  Psychiatric: He has a normal mood and affect.    Assessment/Plan:  1 - Left Ureteral Stone - too large to pass. Rec left stent today for renal decompression as may be contributing to infectious state and acute renal failure, then definitive stone management in elective setting after verified clear of infectious parameters.   Risks, benefits, alternatives, expected peri-op course discussed.   2 - Sepsis of Possible Urinary Source -  Unclear if this or pulm source or both. I feel safest plan is left stent as per above from GU perspective.   3 -  Acute Renal Failure -  Likely multifactorial with some pre-renal dehydration and post-renal obstruction from left ureteral stone. Plan for stent for decompression as per above.   Zachary Warren 05/13/2017, 2:02 PM

## 2017-05-13 NOTE — ED Notes (Signed)
In radiology at this time 

## 2017-05-13 NOTE — ED Notes (Signed)
Report given to [place], Therapist, sports at [facility].

## 2017-05-13 NOTE — Anesthesia Preprocedure Evaluation (Addendum)
Anesthesia Evaluation  Patient identified by MRN, date of birth, ID band Patient awake    Reviewed: Allergy & Precautions, NPO status , Patient's Chart, lab work & pertinent test results  Airway Mallampati: II  TM Distance: >3 FB Neck ROM: Full    Dental  (+) Teeth Intact, Dental Advisory Given, Poor Dentition, Chipped, Missing   Pulmonary sleep apnea , pneumonia, unresolved, former smoker,      Pulmonary exam normal breath sounds clear to auscultation       Cardiovascular hypertension, Pt. on medications + CAD, + CABG and + Peripheral Vascular Disease  + pacemaker  Rhythm:Irregular Rate:Tachycardia  Echo 01/21/17: Study Conclusions  - Left ventricle: The cavity size was normal. Wall thickness was increased in a pattern of moderate LVH. Systolic function was normal. The estimated ejection fraction was in the range of 55% to 60%. Wall motion was normal; there were no regional wall motion abnormalities. The study is not technically sufficient to allow evaluation of LV diastolic function. - Aortic valve: Sclerosis without stenosis. There was no   regurgitation. - Mitral valve: Mildly thickened leaflets . There was mild   regurgitation. - Left atrium: Moderately dilated. - Right ventricle: The cavity size was mildly dilated. Pacer wire or catheter noted in right ventricle. Systolic function was normal. - Right atrium: The atrium was at the upper limits of normal in size. Pacer wire or catheter noted in right atrium. - Tricuspid valve: There was moderate regurgitation. - Pulmonary arteries: PA peak pressure: 54 mm Hg (S). - Inferior vena cava: The vessel was dilated. The respirophasic diameter changes were blunted (< 50%), consistent with elevated central venous pressure.   Neuro/Psych Seizures -,  PSYCHIATRIC DISORDERS Depression CVA, Residual Symptoms    GI/Hepatic Neg liver ROS, GERD  Medicated,  Endo/Other  negative endocrine  ROS  Renal/GU ARFRenal disease     Musculoskeletal  (+) Arthritis ,   Abdominal   Peds  Hematology  (+) Blood dyscrasia (Eliquis), ,   Anesthesia Other Findings Day of surgery medications reviewed with the patient.  Reproductive/Obstetrics                            Anesthesia Physical Anesthesia Plan  ASA: III and emergent  Anesthesia Plan: General   Post-op Pain Management:    Induction: Intravenous  PONV Risk Score and Plan: 2 and Ondansetron and Dexamethasone  Airway Management Planned: Oral ETT  Additional Equipment:   Intra-op Plan:   Post-operative Plan: Possible Post-op intubation/ventilation  Informed Consent: I have reviewed the patients History and Physical, chart, labs and discussed the procedure including the risks, benefits and alternatives for the proposed anesthesia with the patient or authorized representative who has indicated his/her understanding and acceptance.   Dental advisory given  Plan Discussed with: CRNA  Anesthesia Plan Comments:         Anesthesia Quick Evaluation

## 2017-05-13 NOTE — ED Triage Notes (Signed)
Weakness, poor appetite, not as alert , confused onset 4 days ago

## 2017-05-13 NOTE — Progress Notes (Addendum)
Pharmacy Antibiotic Note  Zachary Warren is a 83 y.o. male admitted on 05/13/2017 with pyelonephritis vs pneumonia.  Concern for aspiration.  Pharmacy has been consulted for Levaquin dosing. Acute kidney injury noted- estimated CrCl~30-24ml/min.   Patient received Levaquin 750mg  x1 at [facility].  Plan: Levaquin 750mg  IV q48h Monitor renal function and cx data   Height: 5\' 9"  (175.3 cm) Weight: 198 lb (89.8 kg) IBW/kg (Calculated) : 70.7  Temp (24hrs), Avg:100 F (37.8 C), Min:98.4 F (36.9 C), Max:101.5 F (38.6 C)   Recent Labs Lab 05/13/17 0940 05/13/17 1228  WBC 18.0*  --   CREATININE 1.87*  --   LATICACIDVEN 1.6 0.8    Estimated Creatinine Clearance: 33.7 mL/min (A) (by C-G formula based on SCr of 1.87 mg/dL (H)).    Allergies  Allergen Reactions  . Aricept [Donepezil Hcl] Other (See Comments)    "stomach pain", generic  . Ceclor [Cefaclor]   . Namenda [Memantine Hcl] Other (See Comments)    "dizzy", brand medication  . Ramipril Other (See Comments)     cough    Antimicrobials this admission: 8/2 Levaquin >>   Dose adjustments this admission:  Microbiology results: 8/2 BCx: IP 8/2 UCx: sent   Thank you for allowing pharmacy to be a part of this patient's care.  Biagio Borg 05/13/2017 5:03 PM

## 2017-05-13 NOTE — Anesthesia Postprocedure Evaluation (Signed)
Anesthesia Post Note  Patient: GRAYDON FOFANA  Procedure(s) Performed: Procedure(s) (LRB): CYSTOSCOPY WITH RETROGRADE PYELOGRAM/URETERAL STENT PLACEMENT LEFT (Left)     Patient location during evaluation: PACU Anesthesia Type: General Level of consciousness: awake and alert Pain management: pain level controlled Vital Signs Assessment: post-procedure vital signs reviewed and stable Respiratory status: spontaneous breathing, nonlabored ventilation, respiratory function stable and patient connected to nasal cannula oxygen Cardiovascular status: blood pressure returned to baseline and stable Postop Assessment: no signs of nausea or vomiting Anesthetic complications: no    Last Vitals:  Vitals:   05/13/17 1900 05/13/17 1915  BP: 123/74 134/85  Pulse: (!) 102 (!) 103  Resp: 16 17  Temp:  36.7 C    Last Pain:  Vitals:   05/13/17 1900  TempSrc:   PainSc: Asleep                 Catalina Gravel

## 2017-05-13 NOTE — ED Notes (Signed)
CRITICAL VALUE ALERT  Critical Value:  Troponin 0.03  Date & Time Notied:  05/13/2017 @ 1051  Provider Notified: Dr. Thurnell Garbe  Orders Received/Actions taken: ED Made aware

## 2017-05-13 NOTE — Brief Op Note (Signed)
05/13/2017  6:36 PM  PATIENT:  Zachary Warren  83 y.o. male  PRE-OPERATIVE DIAGNOSIS:  sepsis, left ureteral stone  POST-OPERATIVE DIAGNOSIS:  sepsis, left ureteral stone  PROCEDURE:  Procedure(s): CYSTOSCOPY WITH RETROGRADE PYELOGRAM/URETERAL STENT PLACEMENT LEFT (Left)  SURGEON:  Surgeon(s) and Role:    Alexis Frock, MD - Primary  PHYSICIAN ASSISTANT:   ASSISTANTS: none   ANESTHESIA:   general  EBL:  Total I/O In: 550 [I.V.:300; IV Piggyback:250] Out: -   BLOOD ADMINISTERED:none  DRAINS: foley to gravity   LOCAL MEDICATIONS USED:  NONE  SPECIMEN:  No Specimen  DISPOSITION OF SPECIMEN:  N/A  COUNTS:  YES  TOURNIQUET:  * No tourniquets in log *  DICTATION: .Other Dictation: Dictation Number [id number]  PLAN OF CARE: Admit to inpatient   PATIENT DISPOSITION:  PACU - hemodynamically stable.   Delay start of Pharmacological VTE agent (>24hrs) due to surgical blood loss or risk of bleeding: yes

## 2017-05-13 NOTE — Progress Notes (Signed)
Pharmacy Note:  Initial antibiotics for Levaquin 750mg  x 1  ordered by EDP for pneumonia No additional doses will be needed today.  Estimated Creatinine Clearance: 33.7 mL/min (A) (by C-G formula based on SCr of 1.87 mg/dL (H)).   Allergies  Allergen Reactions  . Aricept [Donepezil Hcl] Other (See Comments)    "stomach pain", generic  . Ceclor [Cefaclor]   . Namenda [Memantine Hcl] Other (See Comments)    "dizzy", brand medication  . Ramipril Other (See Comments)     cough    Vitals:   05/13/17 0953 05/13/17 1100  BP:  120/66  Pulse:  (!) 114  Resp:  (!) 23  Temp: (!) 101.5 F (38.6 C)     Anti-infectives    Start     Dose/Rate Route Frequency Ordered Stop   05/13/17 1200  levofloxacin (LEVAQUIN) IVPB 750 mg     750 mg 100 mL/hr over 90 Minutes Intravenous  Once 05/13/17 1151       Plan: Initial doses of Levaquin 750mg  X 1 ordered. F/U admission orders for further dosing if therapy continued.  Ena Dawley, [facility] 05/13/2017 12:14 PM

## 2017-05-13 NOTE — ED Notes (Signed)
PA at bedside.

## 2017-05-13 NOTE — ED Notes (Signed)
[facility] here for transport.  

## 2017-05-13 NOTE — Transfer of Care (Signed)
Immediate Anesthesia Transfer of Care Note  Patient: Zachary Warren  Procedure(s) Performed: Procedure(s): CYSTOSCOPY WITH RETROGRADE PYELOGRAM/URETERAL STENT PLACEMENT LEFT (Left)  Patient Location: PACU  Anesthesia Type:General  Level of Consciousness: sedated, patient cooperative and responds to stimulation  Airway & Oxygen Therapy: Patient Spontanous Breathing and Patient connected to face mask oxygen  Post-op Assessment: Report given to RN and Post -op Vital signs reviewed and stable  Post vital signs: Reviewed and stable  Last Vitals:  Vitals:   05/13/17 1530 05/13/17 1732  BP: 118/75 (!) 149/61  Pulse: (!) 120   Resp: (!) 25 (!) 21  Temp:  37.3 C    Last Pain:  Vitals:   05/13/17 1732  TempSrc: Oral  PainSc:          Complications: No apparent anesthesia complications

## 2017-05-13 NOTE — Anesthesia Procedure Notes (Signed)
Procedure Name: Intubation Date/Time: 05/13/2017 6:17 PM Performed by: Claudia Desanctis Pre-anesthesia Checklist: Patient identified, Emergency Drugs available, Suction available and Patient being monitored Patient Re-evaluated:Patient Re-evaluated prior to induction Oxygen Delivery Method: Circle system utilized Preoxygenation: Pre-oxygenation with 100% oxygen Induction Type: IV induction Ventilation: Mask ventilation without difficulty Laryngoscope Size: 2 and Miller Grade View: Grade I Tube type: Oral Tube size: 7.5 mm Number of attempts: 1 Airway Equipment and Method: Stylet Placement Confirmation: ETT inserted through vocal cords under direct vision,  positive ETCO2 and breath sounds checked- equal and bilateral Secured at: 23 cm Tube secured with: Tape Dental Injury: Teeth and Oropharynx as per pre-operative assessment

## 2017-05-13 NOTE — Interval H&P Note (Signed)
History and Physical Interval Note:  05/13/2017 6:06 PM  Zachary Warren  has presented today for surgery, with the diagnosis of sepsis, left ureteral stone  The various methods of treatment have been discussed with the patient and family. After consideration of risks, benefits and other options for treatment, the patient has consented to  Procedure(s): CYSTOSCOPY WITH RETROGRADE PYELOGRAM/URETERAL STENT PLACEMENT LEFT (Left) as a surgical intervention .  The patient's history has been reviewed, patient examined, no change in status, stable for surgery.  I have reviewed the patient's chart and labs.  Questions were answered to the patient's satisfaction.     Tianah Lonardo

## 2017-05-13 NOTE — H&P (Signed)
History and Physical    Zachary Warren NAT:[id number] DOB: August 11, 1933 DOA: 05/13/2017  PCP: Wenda Low, MD   Patient coming from: Home    Chief Complaint: Generalized malaise, fevers and weakness.  HPI: Zachary Warren is a 83 y.o. male with medical history significant of vascular dementia and remote cerebrovascular accident, presents with 3 days of generalized malaise, fevers, chills. Symptoms have been persistent, no improving or worsening factors, associated with confusion and worsening disorientation. This morning the patient had nausea and vomiting. Patient's wife decided to bring him to the hospital for further evaluation. The patient's history is limited due to his dementia, most information obtained from his family members at bedside. No history of nephrolithiasis in the past.  Apparently patient had a severe choking event while eating approximately 3 days ago, no worsening cough or phlegm production.    ED Course: Patient found febrile with septic features, workup with CT abdomen positive for ureteral stone on the left side, possible left lower lobe pneumonia, started on antibiotic therapy, contacted Urology. Referred for admission at [facility] for further Urology evaluation and sepsis management.   Review of Systems:  1. Gen. Positive for fevers chills, decreased appetite 2. ENT no runny nose or sore throat 3. Pulmonary no shortness of breath, worsening cough or hemoptysis 4. Cardiovascular, no angina no claudication 5. Gastrointestinal no abdominal pain, positive for nausea and vomiting 6. Musculoskeletal no joint pain 7. Dermatology no rashes 8. Neurology no seizures or paresthesias 9. Urology no dysuria or increased urinary frequency and no hematuria 10. Endocrine, no tremors, heat or cold intolerance  Past Medical History:  Diagnosis Date  . Coronary artery disease    s/p 5v CABG 2002  . CVA (cerebral infarction) 1999  . DDD (degenerative disc disease), cervical   .  Depression   . GERD (gastroesophageal reflux disease)   . Hyperlipidemia   . Hypertension   . Memory impairment   . OSA (obstructive sleep apnea)    Recently diagnosed though pt. has not had CPAP trial  . Pacemaker   . Persistent atrial fibrillation ([place])    on pradaxa  . Prostate cancer ([place]) 2006   s/p XRT  . PVD (peripheral vascular disease) ([place])   . Seizures ([place])   . Tachycardia-bradycardia [facility])    s/p PPM by [facility] 6/12    Past Surgical History:  Procedure Laterality Date  . CARDIOVERSION N/A 08/21/2013   Procedure: CARDIOVERSION;  Surgeon: Candee Furbish, MD;  Location: [place];  Service: Cardiovascular;  Laterality: N/A;  . [place]  . CORONARY ARTERY BYPASS GRAFT  2002  . INSERT / REPLACE / REMOVE PACEMAKER    . PACEMAKER INSERTION     implanted by [facility] 6/12     reports that he quit smoking about 19 years ago. His smoking use included Cigarettes. He has a 96.00 pack-year smoking history. He has never used smokeless tobacco. He reports that he does not drink alcohol or use drugs.  Allergies  Allergen Reactions  . Aricept [Donepezil Hcl] Other (See Comments)    "stomach pain", generic  . Ceclor [Cefaclor]   . Namenda [Memantine Hcl] Other (See Comments)    "dizzy", brand medication  . Ramipril Other (See Comments)     cough    Family History  Problem Relation Age of Onset  . Cancer Mother        ovarian  . Ovarian cancer Mother   . Cancer Father        pancreatic  .  Pancreatic cancer Father   . Cancer Brother        lung  . Ovarian cancer Unknown   . Pancreatic cancer Unknown   . Lung cancer Unknown     Prior to Admission medications   Medication Sig Start Date End Date Taking? Authorizing Provider  atorvastatin (LIPITOR) 40 MG tablet TAKE 1 TABLET BY MOUTH EVERY DAY - PATIENT DUE FOR LIPID PANEL 12/10/16  Yes Jerline Pain, MD  Budesonide-Formoterol Fumarate (SYMBICORT IN) Inhale 2 puffs into the lungs daily.   Yes [provider]  diltiazem  (CARDIZEM CD) 180 MG 24 hr capsule TAKE 1 CAPSULE BY MOUTH TWICE DAILY 09/17/16  Yes Allred, Jeneen Rinks, MD  ELIQUIS 5 MG TABS tablet Take 1 tablet by mouth 2 (two) times daily. 12/23/16  Yes [provider]  FLUoxetine (PROZAC) 40 MG capsule Take 40 mg by mouth daily.     Yes [provider]  furosemide (LASIX) 40 MG tablet Take 1 tablet (40 mg total) by mouth daily. 01/04/17  Yes Jerline Pain, MD  guaiFENesin (MUCINEX) 600 MG 12 hr tablet Take 600 mg by mouth 2 (two) times daily.   Yes [provider]  Multiple Vitamins-Minerals (MULTIVITAMIN WITH MINERALS) tablet Take 1 tablet by mouth daily. Chewable flinstones   Yes [provider]  omeprazole (PRILOSEC OTC) 20 MG tablet Take 20 mg by mouth 2 (two) times daily.     Yes [provider]  topiramate (TOPAMAX) 50 MG tablet Take 1 tablet (50 mg total) by mouth 2 (two) times daily. 07/25/15  Yes Penni Bombard, MD    Physical Exam: Vitals:   05/13/17 0947 05/13/17 0953 05/13/17 1100 05/13/17 1200  BP: (!) 125/52  120/66 121/80  Pulse:   (!) 114 (!) 110  Resp:   (!) 23 (!) 26  Temp:  (!) 101.5 F (38.6 C)    TempSrc:  Rectal    SpO2:   92% 92%  Weight:      Height:        Constitutional: Deconditioned. Vitals:   05/13/17 0947 05/13/17 0953 05/13/17 1100 05/13/17 1200  BP: (!) 125/52  120/66 121/80  Pulse:   (!) 114 (!) 110  Resp:   (!) 23 (!) 26  Temp:  (!) 101.5 F (38.6 C)    TempSrc:  Rectal    SpO2:   92% 92%  Weight:      Height:       Eyes: PERRL, lids and conjunctivae with mild pallor, no icterus Head normocephalic, nose and ears no deformities ENMT: Mucous membranes are dry. Posterior pharynx clear of any exudate or lesions.Normal dentition.  Neck: normal, supple, no masses, no thyromegaly Respiratory: No wheezing, no rhonchi, positive rales predominant at the left base, . Normal respiratory effort. No accessory muscle use.  Cardiovascular: Regular rate and rhythm, no murmurs  / rubs / gallops. Right lower extremity edema, non pitting 2+, compared to left. 2+ pedal pulses. No carotid bruits.  Abdomen: no tenderness, no masses palpated. No hepatosplenomegaly. Bowel sounds positive. Positive tenderness to percussion at the left costovertebral angle.  Musculoskeletal: no clubbing / cyanosis. No joint deformity upper and lower extremities. Good ROM, no contractures. Normal muscle tone.  Skin: no rashes, lesions, ulcers. No induration Neurologic: CN 2-12 grossly intact. Sensation intact, DTR normal. Strength 5/5 in all 4.    Labs on Admission: I have personally reviewed following labs and imaging studies  CBC:  Recent Labs Lab 05/13/17 0940  WBC 18.0*  NEUTROABS 15.8*  HGB 13.2  HCT 41.8  MCV 95.7  PLT 606   Basic Metabolic Panel:  Recent Labs Lab 05/13/17 0940  NA 141  K 3.3*  CL 106  CO2 26  GLUCOSE 139*  BUN 30*  CREATININE 1.87*  CALCIUM 8.7*   GFR: Estimated Creatinine Clearance: 33.7 mL/min (A) (by C-G formula based on SCr of 1.87 mg/dL (H)). Liver Function Tests:  Recent Labs Lab 05/13/17 0940  AST 18  ALT 14*  ALKPHOS 77  BILITOT 0.8  PROT 6.9  ALBUMIN 3.4*    Recent Labs Lab 05/13/17 0940  LIPASE 22   No results for input(s): AMMONIA in the last 168 hours. Coagulation Profile: No results for input(s): INR, PROTIME in the last 168 hours. Cardiac Enzymes:  Recent Labs Lab 05/13/17 0940  TROPONINI 0.03*   BNP (last 3 results) No results for input(s): PROBNP in the last 8760 hours. HbA1C: No results for input(s): HGBA1C in the last 72 hours. CBG: No results for input(s): GLUCAP in the last 168 hours. Lipid Profile: No results for input(s): CHOL, HDL, LDLCALC, TRIG, CHOLHDL, LDLDIRECT in the last 72 hours. Thyroid Function Tests: No results for input(s): TSH, T4TOTAL, FREET4, T3FREE, THYROIDAB in the last 72 hours. Anemia Panel: No results for input(s): VITAMINB12, FOLATE, FERRITIN, TIBC, IRON, RETICCTPCT in the  last 72 hours. Urine analysis:    Component Value Date/Time   COLORURINE YELLOW 05/13/2017 1003   APPEARANCEUR HAZY (A) 05/13/2017 1003   LABSPEC 1.014 05/13/2017 1003   PHURINE 5.0 05/13/2017 1003   GLUCOSEU NEGATIVE 05/13/2017 1003   HGBUR MODERATE (A) 05/13/2017 1003   BILIRUBINUR NEGATIVE 05/13/2017 1003   KETONESUR NEGATIVE 05/13/2017 1003   PROTEINUR NEGATIVE 05/13/2017 1003   UROBILINOGEN 0.2 10/23/2014 2359   NITRITE NEGATIVE 05/13/2017 1003   LEUKOCYTESUR NEGATIVE 05/13/2017 1003    Radiological Exams on Admission: Ct Abdomen Pelvis Wo Contrast  Addendum Date: 05/13/2017   ADDENDUM REPORT: 05/13/2017 11:41 ADDENDUM: Aortic aneurysm NOS (ICD10-I71.9). Electronically Signed   By: Lowella Grip III M.D.   On: 05/13/2017 11:41   Result Date: 05/13/2017 CLINICAL DATA:  Loss of appetite. Weakness. Prostate carcinoma. Abdominal pain. EXAM: CT ABDOMEN AND PELVIS WITHOUT CONTRAST TECHNIQUE: Multidetector CT imaging of the abdomen and pelvis was performed following the standard protocol without and contrast material administration. COMPARISON:  None. FINDINGS: Lower chest: There is bibasilar lung scarring. There is patchy airspace opacity in the left lower lobe consistent with pneumonia. Pacemaker leads are attached to the right atrium and right ventricle. There is a small hiatal hernia. Hepatobiliary: No focal liver lesions are evident on this noncontrast enhanced study. There are foci of calcification and peripheral hepatic artery vessels anteriorly. There are multiple gallstones in the gallbladder. Gallbladder wall does not appear appreciably thickened. There is no biliary duct dilatation. Pancreas: No pancreatic mass or inflammatory focus. Spleen: No splenic lesions are evident. Adrenals/Urinary Tract: Adrenals appear unremarkable bilaterally. There is no appreciable renal mass on either side. There is moderately severe hydronephrosis on the left. No hydronephrosis on the right. There is  a 1 mm calculus in the lower pole the right kidney, nonobstructing. There are several 1 mm calculi in the lower pole left kidney, nonobstructing. There is a calculus in the left ureter at the upper acetabular level measuring 1.0 x 0.8 x 0.6 cm. No other ureteral calculi are evident. Urinary bladder is midline. Urinary bladder wall is borderline thickened. Stomach/Bowel: There is no appreciable bowel wall or mesenteric thickening. There is  moderate stool in the colon. No bowel obstruction. No free air or portal venous air. No bowel pneumatosis. Vascular/Lymphatic: There is atherosclerotic calcification throughout the aorta and iliac arterial vessels as well as extending into both common femoral and superficial femoral arteries. Aneurysm dilatation of the mid the distal abdominal aorta is noted with a maximum transverse diameter of 3.3 x 3.2 cm. No periaortic fluid. There is extensive calcification in the major mesenteric vessels. Calcification is marked in the proximal right renal artery. Both common and external iliac arteries are tortuous. There is no appreciable adenopathy in the abdomen or pelvis. Reproductive: There are seed implants in the prostate. The prostatic size and contour normal. Seminal vesicles appear normal. Note that the right testis is located in the inferior right inguinal ring. Other: No periappendiceal region inflammation. No appreciable abscess or ascites is evident in the abdomen or pelvis. There is a rather minimal ventral hernia containing only fat. Musculoskeletal: There is extensive degenerative change in the lumbar spine. There are no blastic or lytic bone lesions. No intramuscular or abdominal wall lesion evident. IMPRESSION: 1. There is a calculus in the left ureter at the upper acetabulum level measuring 1.0 x 0.8 x 0.6 cm with moderately severe hydronephrosis on the left. There are small nonobstructing calculi in each kidney. 2. Slight urinary bladder wall thickening, potentially due  to inflammation secondary to seed implants in the prostate adjacent to the bladder. 3. Airspace consolidation left lower lobe consistent with pneumonia. 4. Cholelithiasis. Gallbladder wall does not appear appreciably thickened on this study. 5. Mid and distal abdominal aortic aneurysm with maximum transverse diameter of 3.3 x 3.2 cm. Recommend followup by ultrasound in 3 years. This recommendation follows ACR consensus guidelines: White Paper of the ACR Incidental Findings Committee II on Vascular Findings. J Am Coll Radiol 2013; 10:789-794. 6. Extensive atherosclerotic calcification throughout the aorta and major pelvic arterial vessels as well as major mesenteric vessels. Marked atherosclerotic calcification in the proximal right renal artery. In this regard, question whether patient is hypertensive. 7. Right testis in distal inguinal ring. No complicating features in this area. 8. Small hiatal hernia. Minimal ventral hernia containing only fat. Aortic Atherosclerosis (ICD10-I70.0). Electronically Signed: By: Lowella Grip III M.D. On: 05/13/2017 11:35   Dg Chest 2 View  Result Date: 05/13/2017 CLINICAL DATA:  Weakness and cough.  Nausea vomiting EXAM: CHEST  2 VIEW COMPARISON:  06/16/2016 FINDINGS: Mild cardiac enlargement. Dual lead pacemaker unchanged. CABG. Negative for heart failure. Left lower lobe airspace disease has progressed since the study of 06/16/2016. There appears to have been pneumonia in this area on earlier studies. This could represent recurrent pneumonia or progressive scarring. Negative for pleural effusion. Underlying COPD. IMPRESSION: COPD Left lower lobe airspace disease may represent recurrent pneumonia Electronically Signed   By: Franchot Gallo M.D.   On: 05/13/2017 10:50   Ct Head Wo Contrast  Result Date: 05/13/2017 CLINICAL DATA:  Weakness, confusion. EXAM: CT HEAD WITHOUT CONTRAST TECHNIQUE: Contiguous axial images were obtained from the base of the skull through the vertex  without intravenous contrast. COMPARISON:  CT scan of October 10, 2014. FINDINGS: Brain: Mild diffuse cortical atrophy is noted. Stable left frontal encephalomalacia is noted consistent with old infarction. No mass effect or midline shift is noted. Ventricular size is within normal limits. There is no evidence of mass lesion, hemorrhage or acute infarction. Vascular: No hyperdense vessel or unexpected calcification. Skull: Normal. Negative for fracture or focal lesion. Sinuses/Orbits: No acute finding. Other: None. IMPRESSION: Mild  diffuse cortical atrophy. Stable old left frontal infarction. No acute intracranial abnormality seen. Electronically Signed   By: Marijo Conception, M.D.   On: 05/13/2017 11:29   US Venous Img Lower Unilateral Right  Result Date: 05/13/2017 CLINICAL DATA:  Right lower extremity edema. EXAM: RIGHT LOWER EXTREMITY VENOUS DOPPLER ULTRASOUND TECHNIQUE: Gray-scale sonography with graded compression, as well as color Doppler and duplex ultrasound were performed to evaluate the lower extremity deep venous systems from the level of the common femoral vein and including the common femoral, femoral, profunda femoral, popliteal and calf veins including the posterior tibial, peroneal and gastrocnemius veins when visible. The superficial great saphenous vein was also interrogated. Spectral Doppler was utilized to evaluate flow at rest and with distal augmentation maneuvers in the common femoral, femoral and popliteal veins. COMPARISON:  None. FINDINGS: Contralateral Common Femoral Vein: Respiratory phasicity is normal and symmetric with the symptomatic side. No evidence of thrombus. Normal compressibility. Common Femoral Vein: No evidence of thrombus. Normal compressibility, respiratory phasicity and response to augmentation. Saphenofemoral Junction: No evidence of thrombus. Normal compressibility and flow on color Doppler imaging. Profunda Femoral Vein: No evidence of thrombus. Normal  compressibility and flow on color Doppler imaging. Femoral Vein: No evidence of thrombus. Normal compressibility, respiratory phasicity and response to augmentation. Popliteal Vein: No evidence of thrombus. Normal compressibility, respiratory phasicity and response to augmentation. Calf Veins: No evidence of thrombus. Normal compressibility and flow on color Doppler imaging. Other Findings:  None. IMPRESSION: No evidence of DVT within the right lower extremity. Electronically Signed   By: Marcello Moores  Register   On: 05/13/2017 10:37    EKG: Independently reviewed. Atrial fibrillation, ventricular rate of 99 bpm, normal axis, poor R-wave progression.   Assessment/Plan Active Problems:   Pyelonephritis  This is a in 83 year old male who presented with 3 days of malaise, fevers, chills and generalized weakness, associated worsening confusion. Episode of choking while eating about 72 hours ago. On the initial physical examination his blood pressure 126/74, heart rate 113, respiratory rate 21-26, oxygen saturation 92% on 2 L nasal cannula. His conjunctiva is pale, his oral mucosa is dry, his lungs had significant rales at the left base, heart S1-S2 present rhythmic his abdomen soft, he does have pain to percussion at the left costovertebral angle, positive asymmetric nonpitting edema at the lower extremities greater right and left. Sodium 141, potassium 3.3, chloride 106, bicarbonate 26, glucose 139, BUN 30, creatinine 1.87, white count 18.0, hemoglobin 13.2, hematocrit 41.8, platelets 188. Urine analysis was 0-5 white cells, 6-30 RBCs, negative proteins. Head CT with mild diffuse cortical atrophy, stable old left frontal infarction. CT of the abdomen with a calculus at the left ureter at the upper acetabulum level measuring [phone] cm with moderately severe hydronephrosis on the left. Airspace consultation the left lower lobe. Abdominal aortic aneurysm 3.33.2 cm. Right testis in distal inguinal ring. Chest  x-ray with left lower lobe opacity, personally reviewed.  Working diagnosis: Sepsis due to pyelonephritis, related to obstructive uropathy complicated by aspiration pneumonia, left lower lobe.  1. Sepsis due to left pyelonephritis, obstructive uropathy, present on admission. Patient will be admitted to the [facility] at [facility], will continue IV fluids with D5 normal saline at 75 mL per hour, follow-up on cell count, temperature curve and cultures. Antibiotic therapy with IV levofloxacin, note patient allergic to cephalosporins. Will follow up with urology recommendations for source control. Will hold on furosemide.  2. Aspiration pneumonia, left lower lobe, present on admission. Patient had  a significant choking event while eating, presumed this been the event that lead to aspiration pneumonia. Likely patient was already deconditioned due to  evolving pyelonephritis, and resulted in aspiration. Will continue antibiotic therapy with IV levofloxacin continue oximetry monitoring and supplemental oxygen per nasal cannula, aspiration precautions. Speech and swallow evaluation during this hospitalization.   3. Acute kidney injury. Will continue hydration with D5/normal saline, likely prerenal, renal failure, with follow-up kidney function in the morning, avoid hypotension and nephrotoxic agents. Hold furosemide. Serum potassium is 3.3 with a serum bicarbonate of 26.   4. Chronic atrial fibrillation. Continue rate control with diltiazem 180 mg daily, hold on apixaban, in face of a possible urologic procedure. Continue telemetry monitoring.   5. Metabolic encephalopathy. Neurochecks every 4 hours, aspiration precautions, continue topiramate and fluoxetine. Physical therapy evaluation. Patient does have increased risk of developing delirium.    DVT prophylaxis: enoxaparin  Code Status: Full Family Communication:  Disposition Plan: Home  Consults called: Urology, Dr. Tresa Moore Admission status:  Inpatient    Sujey Gundry Gerome Apley MD [facility] Pager [phone]  If 7PM-7AM, please contact night-coverage [email or web address] Password [email or web address]  05/13/2017, 1:28 PM

## 2017-05-13 NOTE — ED Provider Notes (Signed)
[facility] DEPT Provider Note   CSN: [id number] Arrival date & time: 05/13/17  0902     History   Chief Complaint Chief Complaint  Patient presents with  . Weakness    HPI Zachary Warren is a 83 y.o. male.  The history is provided by the patient, a relative and a caregiver. The history is limited by the condition of the patient (Hx dementia).  Weakness   Pt was seen at 0925. Per pt's family and pt: Pt with gradual confusion, generalized weakness/fatigue, N/V, "possibly blood in his urine," abd pain, poor PO intake, cough, and RLE "swelling" for the past 3 to 4 days. Pt has had home fevers to "100.1." Pt has significant hx of dementia, as well as aphasia and LLE weakness from previous CVA. No reported falls, no diarrhea, no black or blood in emesis, no new focal motor weakness.   Past Medical History:  Diagnosis Date  . Coronary artery disease    s/p 5v CABG 2002  . CVA (cerebral infarction) 1999  . DDD (degenerative disc disease), cervical   . Depression   . GERD (gastroesophageal reflux disease)   . Hyperlipidemia   . Hypertension   . Memory impairment   . OSA (obstructive sleep apnea)    Recently diagnosed though pt. has not had CPAP trial  . Pacemaker   . Persistent atrial fibrillation ([place])    on pradaxa  . Prostate cancer ([place]) 2006   s/p XRT  . PVD (peripheral vascular disease) ([place])   . Seizures ([place])   . Tachycardia-bradycardia [facility])    s/p PPM by [facility] 6/12    Patient Active Problem List   Diagnosis Date Noted  . Hypokalemia 10/25/2014  . Sepsis ([place]) 10/24/2014  . CAP (community acquired pneumonia) 10/24/2014  . Atrial fibrillation with RVR ([place]) 10/24/2014  . MCI (mild cognitive impairment) with memory loss 07/24/2014  . Cardiac pacemaker in situ 12/25/2013  . Aneurysm ([place]) 06/28/2012  . SIRS (systemic inflammatory response syndrome) ([place]) 02/05/2012  . Encephalopathy acute 02/05/2012  . CVA (cerebral infarction) 02/05/2012  . Seizure ([place])  02/05/2012  . Dysphagia as late effect of stroke 02/05/2012  . GERD (gastroesophageal reflux disease) 02/05/2012  . 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 Hyperlipidemia 11/21/2010  . Essential hypertension 11/21/2010  . CAD, NATIVE VESSEL 11/21/2010  . Atrial fibrillation ([place]) 11/21/2010  . PVD 11/21/2010    Past Surgical History:  Procedure Laterality Date  . CARDIOVERSION N/A 08/21/2013   Procedure: CARDIOVERSION;  Surgeon: Candee Furbish, MD;  Location: [facility];  Service: Cardiovascular;  Laterality: N/A;  . [place]  . CORONARY ARTERY BYPASS GRAFT  2002  . INSERT / REPLACE / REMOVE PACEMAKER    . PACEMAKER INSERTION     implanted by [facility] 6/12       Home Medications    Prior to Admission medications   Medication Sig Start Date End Date Taking? Authorizing Provider  atorvastatin (LIPITOR) 40 MG tablet TAKE 1 TABLET BY MOUTH EVERY DAY - PATIENT DUE FOR LIPID PANEL 12/10/16   Jerline Pain, MD  Budesonide-Formoterol Fumarate (SYMBICORT IN) Inhale 2 puffs into the lungs daily.    [provider]  diltiazem (CARDIZEM CD) 180 MG 24 hr capsule TAKE 1 CAPSULE BY MOUTH TWICE DAILY 09/17/16   Allred, Jeneen Rinks, MD  ELIQUIS 5 MG TABS tablet Take 1 tablet by mouth 2 (two) times daily. 12/23/16   [provider]  FLUoxetine (PROZAC) 40 MG capsule Take 40 mg by mouth  daily.      [provider]  furosemide (LASIX) 40 MG tablet Take 1 tablet (40 mg total) by mouth daily. 01/04/17   Jerline Pain, MD  guaiFENesin (MUCINEX) 600 MG 12 hr tablet Take 600 mg by mouth 2 (two) times daily.    [provider]  Multiple Vitamins-Minerals (MULTIVITAMIN WITH MINERALS) tablet Take 1 tablet by mouth daily. Chewable flinstones    [provider]  omeprazole (PRILOSEC OTC) 20 MG tablet Take 20 mg by mouth 2 (two) times daily.      [provider]  topiramate (TOPAMAX) 50 MG tablet Take 1 tablet (50 mg total) by mouth 2 (two) times daily. 07/25/15    Penumalli, Earlean Polka, MD    Family History Family History  Problem Relation Age of Onset  . Cancer Mother        ovarian  . Ovarian cancer Mother   . Cancer Father        pancreatic  . Pancreatic cancer Father   . Cancer Brother        lung  . Ovarian cancer Unknown   . Pancreatic cancer Unknown   . Lung cancer Unknown     Social History Social History  Substance Use Topics  . Smoking status: Former Smoker    Packs/day: 2.00    Years: 48.00    Types: Cigarettes    Quit date: 03/09/1998  . Smokeless tobacco: Never Used  . Alcohol use No     Comment: quit: 1999     Allergies   Aricept [donepezil hcl]; Ceclor [cefaclor]; Namenda [memantine hcl]; and Ramipril   Review of Systems Review of Systems  Unable to perform ROS: Dementia  Neurological: Positive for weakness.     Physical Exam Updated Vital Signs Pulse 96   Temp 100 F (37.8 C) (Oral)   Resp (!) 24   Ht 5\' 9"  (1.753 m)   Wt 89.8 kg (198 lb)   SpO2 92%   BMI 29.24 kg/m    09:54:01 Orthostatic Vital Signs JS  Orthostatic Lying   BP- Lying: 127/63  Pulse- Lying: 108      Orthostatic Sitting  BP- Sitting: 121/88  Pulse- Sitting: 118      Orthostatic Standing at 0 minutes  BP- Standing at 0 minutes: 118/78  Pulse- Standing at 0 minutes: 114   Patient Vitals for the past 24 hrs:  BP Temp Temp src Pulse Resp SpO2 Height Weight  05/13/17 0953 - (!) 101.5 F (38.6 C) Rectal - - - - -  05/13/17 0947 (!) 125/52 - - - - - - -  05/13/17 0929 - 100 F (37.8 C) Oral 96 (!) 24 92 % - -  05/13/17 1497 - - - - - - 5\' 9"  (1.753 m) 89.8 kg (198 lb)      Physical Exam 0930: Physical examination:  Nursing notes reviewed; Vital signs and O2 SAT reviewed;  Constitutional: Well developed, Well nourished, In no acute distress; Head:  Normocephalic, atraumatic; Eyes: EOMI, PERRL, No scleral icterus; ENMT: Mouth and pharynx normal, Mucous membranes dry; Neck: Supple, Full range of motion, No lymphadenopathy;  Cardiovascular: Irregular rate and rhythm, No gallop; Respiratory: Breath sounds coarse & equal bilaterally, No wheezes. Normal respiratory effort/excursion; Chest: Nontender, Movement normal; Abdomen: Soft, +diffuse tenderness to palp. No guarding. Nondistended, Normal bowel sounds; Genitourinary: No CVA tenderness; Extremities: Pulses normal, No tenderness, +2 RLE pedal edema, +1 LLE pedal edema, with calf asymmetry.; Neuro: Awake, alert. Aphasic, with LLE weakness  per baseline. Grips equal. Strength 5/5 equal bilat UE's and RLE.; Skin: Color normal, Warm, Dry.   ED Treatments / Results  Labs (all labs ordered are listed, but only abnormal results are displayed)   EKG  EKG Interpretation  Date/Time:  Thursday May 13 2017 09:45:52 EDT Ventricular Rate:  99 PR Interval:    QRS Duration: 93 QT Interval:  318 QTC Calculation: 408 R Axis:   75 Text Interpretation:  Atrial fibrillation Borderline repol abnormality, diffuse leads Baseline wander When compared with ECG of 10/23/2014 QT has shortened Otherwise no significant change Confirmed by Francine Graven [phone]) on 05/13/2017 9:48:55 AM       Radiology   Procedures Procedures (including critical care time)  Medications Ordered in ED Medications - No data to display   Initial Impression / Assessment and Plan / ED Course  I have reviewed the triage vital signs and the nursing notes.  Pertinent labs & imaging results that were available during my care of the patient were reviewed by me and considered in my medical decision making (see chart for details).  MDM Reviewed: previous chart, nursing note and vitals Reviewed previous: labs and ECG Interpretation: labs, ECG, x-ray and CT scan   Results for orders placed or performed during the hospital encounter of 05/13/17  Comprehensive metabolic panel  Result Value Ref Range   Sodium 141 135 - 145 mmol/L   Potassium 3.3 (L) 3.5 - 5.1 mmol/L   Chloride 106 101 - 111 mmol/L    CO2 26 22 - 32 mmol/L   Glucose, Bld 139 (H) 65 - 99 mg/dL   BUN 30 (H) 6 - 20 mg/dL   Creatinine, Ser 1.87 (H) 0.61 - 1.24 mg/dL   Calcium 8.7 (L) 8.9 - 10.3 mg/dL   Total Protein 6.9 6.5 - 8.1 g/dL   Albumin 3.4 (L) 3.5 - 5.0 g/dL   AST 18 15 - 41 U/L   ALT 14 (L) 17 - 63 U/L   Alkaline Phosphatase 77 38 - 126 U/L   Total Bilirubin 0.8 0.3 - 1.2 mg/dL   GFR calc non Af Amer 32 (L) >60 mL/min   GFR calc Af Amer 37 (L) >60 mL/min   Anion gap 9 5 - 15  Troponin I  Result Value Ref Range   Troponin I 0.03 (HH) <0.03 ng/mL  Lactic acid, plasma  Result Value Ref Range   Lactic Acid, Venous 1.6 0.5 - 1.9 mmol/L  CBC with Differential  Result Value Ref Range   WBC 18.0 (H) 4.0 - 10.5 K/uL   RBC 4.37 4.22 - 5.81 MIL/uL   Hemoglobin 13.2 13.0 - 17.0 g/dL   HCT 41.8 39.0 - 52.0 %   MCV 95.7 78.0 - 100.0 fL   MCH 30.2 26.0 - 34.0 pg   MCHC 31.6 30.0 - 36.0 g/dL   RDW 14.0 11.5 - 15.5 %   Platelets 188 150 - 400 K/uL   Neutrophils Relative % 88 %   Neutro Abs 15.8 (H) 1.7 - 7.7 K/uL   Lymphocytes Relative 4 %   Lymphs Abs 0.7 0.7 - 4.0 K/uL   Monocytes Relative 8 %   Monocytes Absolute 1.5 (H) 0.1 - 1.0 K/uL   Eosinophils Relative 0 %   Eosinophils Absolute 0.0 0.0 - 0.7 K/uL   Basophils Relative 0 %   Basophils Absolute 0.0 0.0 - 0.1 K/uL  Lipase, blood  Result Value Ref Range   Lipase 22 11 - 51 U/L  Brain natriuretic peptide  Result Value Ref Range   B Natriuretic Peptide 258.0 (H) 0.0 - 100.0 pg/mL  Urinalysis, Routine w reflex microscopic  Result Value Ref Range   Color, Urine YELLOW YELLOW   APPearance HAZY (A) CLEAR   Specific Gravity, Urine 1.014 1.005 - 1.030   pH 5.0 5.0 - 8.0   Glucose, UA NEGATIVE NEGATIVE mg/dL   Hgb urine dipstick MODERATE (A) NEGATIVE   Bilirubin Urine NEGATIVE NEGATIVE   Ketones, ur NEGATIVE NEGATIVE mg/dL   Protein, ur NEGATIVE NEGATIVE mg/dL   Nitrite NEGATIVE NEGATIVE   Leukocytes, UA NEGATIVE NEGATIVE   RBC / HPF 6-30 0 - 5 RBC/hpf    WBC, UA 0-5 0 - 5 WBC/hpf   Bacteria, UA RARE (A) NONE SEEN   Squamous Epithelial / LPF NONE SEEN NONE SEEN   Mucous PRESENT    Hyaline Casts, UA PRESENT    Granular Casts, UA PRESENT    Ct Abdomen Pelvis Wo Contrast Addendum Date: 05/13/2017   ADDENDUM REPORT: 05/13/2017 11:41 ADDENDUM: Aortic aneurysm NOS (ICD10-I71.9). Electronically Signed   By: Lowella Grip III M.D.   On: 05/13/2017 11:41   Result Date: 05/13/2017 CLINICAL DATA:  Loss of appetite. Weakness. Prostate carcinoma. Abdominal pain. EXAM: CT ABDOMEN AND PELVIS WITHOUT CONTRAST TECHNIQUE: Multidetector CT imaging of the abdomen and pelvis was performed following the standard protocol without and contrast material administration. COMPARISON:  None. FINDINGS: Lower chest: There is bibasilar lung scarring. There is patchy airspace opacity in the left lower lobe consistent with pneumonia. Pacemaker leads are attached to the right atrium and right ventricle. There is a small hiatal hernia. Hepatobiliary: No focal liver lesions are evident on this noncontrast enhanced study. There are foci of calcification and peripheral hepatic artery vessels anteriorly. There are multiple gallstones in the gallbladder. Gallbladder wall does not appear appreciably thickened. There is no biliary duct dilatation. Pancreas: No pancreatic mass or inflammatory focus. Spleen: No splenic lesions are evident. Adrenals/Urinary Tract: Adrenals appear unremarkable bilaterally. There is no appreciable renal mass on either side. There is moderately severe hydronephrosis on the left. No hydronephrosis on the right. There is a 1 mm calculus in the lower pole the right kidney, nonobstructing. There are several 1 mm calculi in the lower pole left kidney, nonobstructing. There is a calculus in the left ureter at the upper acetabular level measuring 1.0 x 0.8 x 0.6 cm. No other ureteral calculi are evident. Urinary bladder is midline. Urinary bladder wall is borderline  thickened. Stomach/Bowel: There is no appreciable bowel wall or mesenteric thickening. There is moderate stool in the colon. No bowel obstruction. No free air or portal venous air. No bowel pneumatosis. Vascular/Lymphatic: There is atherosclerotic calcification throughout the aorta and iliac arterial vessels as well as extending into both common femoral and superficial femoral arteries. Aneurysm dilatation of the mid the distal abdominal aorta is noted with a maximum transverse diameter of 3.3 x 3.2 cm. No periaortic fluid. There is extensive calcification in the major mesenteric vessels. Calcification is marked in the proximal right renal artery. Both common and external iliac arteries are tortuous. There is no appreciable adenopathy in the abdomen or pelvis. Reproductive: There are seed implants in the prostate. The prostatic size and contour normal. Seminal vesicles appear normal. Note that the right testis is located in the inferior right inguinal ring. Other: No periappendiceal region inflammation. No appreciable abscess or ascites is evident in the abdomen or pelvis. There is a rather minimal ventral hernia containing only fat. Musculoskeletal: There is  extensive degenerative change in the lumbar spine. There are no blastic or lytic bone lesions. No intramuscular or abdominal wall lesion evident. IMPRESSION: 1. There is a calculus in the left ureter at the upper acetabulum level measuring 1.0 x 0.8 x 0.6 cm with moderately severe hydronephrosis on the left. There are small nonobstructing calculi in each kidney. 2. Slight urinary bladder wall thickening, potentially due to inflammation secondary to seed implants in the prostate adjacent to the bladder. 3. Airspace consolidation left lower lobe consistent with pneumonia. 4. Cholelithiasis. Gallbladder wall does not appear appreciably thickened on this study. 5. Mid and distal abdominal aortic aneurysm with maximum transverse diameter of 3.3 x 3.2 cm. Recommend  followup by ultrasound in 3 years. This recommendation follows ACR consensus guidelines: White Paper of the ACR Incidental Findings Committee II on Vascular Findings. J Am Coll Radiol 2013; 10:789-794. 6. Extensive atherosclerotic calcification throughout the aorta and major pelvic arterial vessels as well as major mesenteric vessels. Marked atherosclerotic calcification in the proximal right renal artery. In this regard, question whether patient is hypertensive. 7. Right testis in distal inguinal ring. No complicating features in this area. 8. Small hiatal hernia. Minimal ventral hernia containing only fat. Aortic Atherosclerosis (ICD10-I70.0). Electronically Signed: By: Lowella Grip III M.D. On: 05/13/2017 11:35   Dg Chest 2 View Result Date: 05/13/2017 CLINICAL DATA:  Weakness and cough.  Nausea vomiting EXAM: CHEST  2 VIEW COMPARISON:  06/16/2016 FINDINGS: Mild cardiac enlargement. Dual lead pacemaker unchanged. CABG. Negative for heart failure. Left lower lobe airspace disease has progressed since the study of 06/16/2016. There appears to have been pneumonia in this area on earlier studies. This could represent recurrent pneumonia or progressive scarring. Negative for pleural effusion. Underlying COPD. IMPRESSION: COPD Left lower lobe airspace disease may represent recurrent pneumonia Electronically Signed   By: Franchot Gallo M.D.   On: 05/13/2017 10:50   Ct Head Wo Contrast Result Date: 05/13/2017 CLINICAL DATA:  Weakness, confusion. EXAM: CT HEAD WITHOUT CONTRAST TECHNIQUE: Contiguous axial images were obtained from the base of the skull through the vertex without intravenous contrast. COMPARISON:  CT scan of October 10, 2014. FINDINGS: Brain: Mild diffuse cortical atrophy is noted. Stable left frontal encephalomalacia is noted consistent with old infarction. No mass effect or midline shift is noted. Ventricular size is within normal limits. There is no evidence of mass lesion, hemorrhage or acute  infarction. Vascular: No hyperdense vessel or unexpected calcification. Skull: Normal. Negative for fracture or focal lesion. Sinuses/Orbits: No acute finding. Other: None. IMPRESSION: Mild diffuse cortical atrophy. Stable old left frontal infarction. No acute intracranial abnormality seen. Electronically Signed   By: Marijo Conception, M.D.   On: 05/13/2017 11:29   US Venous Img Lower Unilateral Right Result Date: 05/13/2017 CLINICAL DATA:  Right lower extremity edema. EXAM: RIGHT LOWER EXTREMITY VENOUS DOPPLER ULTRASOUND TECHNIQUE: Gray-scale sonography with graded compression, as well as color Doppler and duplex ultrasound were performed to evaluate the lower extremity deep venous systems from the level of the common femoral vein and including the common femoral, femoral, profunda femoral, popliteal and calf veins including the posterior tibial, peroneal and gastrocnemius veins when visible. The superficial great saphenous vein was also interrogated. Spectral Doppler was utilized to evaluate flow at rest and with distal augmentation maneuvers in the common femoral, femoral and popliteal veins. COMPARISON:  None. FINDINGS: Contralateral Common Femoral Vein: Respiratory phasicity is normal and symmetric with the symptomatic side. No evidence of thrombus. Normal compressibility. Common Femoral Vein: No  evidence of thrombus. Normal compressibility, respiratory phasicity and response to augmentation. Saphenofemoral Junction: No evidence of thrombus. Normal compressibility and flow on color Doppler imaging. Profunda Femoral Vein: No evidence of thrombus. Normal compressibility and flow on color Doppler imaging. Femoral Vein: No evidence of thrombus. Normal compressibility, respiratory phasicity and response to augmentation. Popliteal Vein: No evidence of thrombus. Normal compressibility, respiratory phasicity and response to augmentation. Calf Veins: No evidence of thrombus. Normal compressibility and flow on color  Doppler imaging. Other Findings:  None. IMPRESSION: No evidence of DVT within the right lower extremity. Electronically Signed   By: Marcello Moores  Register   On: 05/13/2017 10:37    Results for JURELL, BASISTA (MRN [id number]) as of 05/13/2017 11:53  Ref. Range 09/09/2015 11:13 08/25/2016 15:00 01/21/2017 14:55 01/25/2017 01:12 05/13/2017 09:40  BUN Latest Ref Range: 6 - 20 mg/dL 17 20 20 16 30  (H)  Creatinine Latest Ref Range: 0.61 - 1.24 mg/dL 1.01 1.28 (H) 1.16 0.90 1.87 (H)    1225:  IV abx started for CAP. No UTI on Udip (UC pending) but ureteral stone seen on CT scan. BUN/Cr elevated from baseline; judicious IVF given. APAP given for fever. T/C to Uro Dr. Tresa Moore, case discussed, including:  HPI, pertinent PM/SHx, VS/PE, dx testing, ED course and treatment:  Requests to tx for CAP, admit to hospitalist and transfer to [facility] for Uro MD consult, pt may need Uro intervention (stent).   1245:  T/C to [facility] Dr. Cathlean Sauer, case discussed, including:  HPI, pertinent PM/SHx, VS/PE, dx testing, ED course and treatment, as well as d/w Uro MD above:  Agreeable to admit/transfer to [facility]. Dx and testing d/w pt and family.  Questions answered.  Verb understanding, agreeable to admit/transfer to [facility].       Final Clinical Impressions(s) / ED Diagnoses   Final diagnoses:  None    New Prescriptions New Prescriptions   No medications on file      Francine Graven, DO 05/17/17 1241

## 2017-05-13 NOTE — Consult Note (Signed)
Reason for Consult: Left Ureteral Stone, Sepsis of Possible Urinary Source, Acute Renal Failure.   Referring Physician: Sander Radon MD  Zachary Warren is an 83 y.o. male.   HPI:   1 - Left Ureteral Stone - 1cm left mid ureteral stone by ER CT on eval abdominal pain and fevers. Stone is solitary, 950HU. Minimal perinephric stranding. He is on topamax.   2 - Sepsis of Possible Urinary Source - Fevers, luekocytosis, malaise x several days prompting ER eval 05/2017. Pneumonia noted. UA without pyuria, no nitrites, no sig bacteruria. Obstructing stone as per above. BCX, UCX 8/2 pending.  Placed on empiric Levaquin.   3 -  Acute Renal Failure - Baseline Cr < 1.0- with rise to 1.8 noted on ER labs 05/2017. Family admits to poor PO intake, left ureteral stone as per above.  PMH sig for CAD/CABG/Pacer/CHF, CVA with aphasia (well adapted),   Today "Zachary Warren" is seen in consultation for above.    Past Medical History:  Diagnosis Date  . Coronary artery disease    s/p 5v CABG 2002  . CVA (cerebral infarction) 1999  . DDD (degenerative disc disease), cervical   . Depression   . GERD (gastroesophageal reflux disease)   . Hyperlipidemia   . Hypertension   . Memory impairment   . OSA (obstructive sleep apnea)    Recently diagnosed though pt. has not had CPAP trial  . Pacemaker   . Persistent atrial fibrillation ([place])    on pradaxa  . Prostate cancer ([place]) 2006   s/p XRT  . PVD (peripheral vascular disease) ([place])   . Seizures ([place])   . Tachycardia-bradycardia [facility])    s/p PPM by [facility] 6/12    Past Surgical History:  Procedure Laterality Date  . CARDIOVERSION N/A 08/21/2013   Procedure: CARDIOVERSION;  Surgeon: Candee Furbish, MD;  Location: [facility];  Service: Cardiovascular;  Laterality: N/A;  . [place]  . CORONARY ARTERY BYPASS GRAFT  2002  . INSERT / REPLACE / REMOVE PACEMAKER    . PACEMAKER INSERTION     implanted by [facility] 6/12    Family History  Problem Relation Age of Onset   . Cancer Mother        ovarian  . Ovarian cancer Mother   . Cancer Father        pancreatic  . Pancreatic cancer Father   . Cancer Brother        lung  . Ovarian cancer Unknown   . Pancreatic cancer Unknown   . Lung cancer Unknown     Social History:  reports that he quit smoking about 19 years ago. His smoking use included Cigarettes. He has a 96.00 pack-year smoking history. He has never used smokeless tobacco. He reports that he does not drink alcohol or use drugs.  Allergies:  Allergies  Allergen Reactions  . Aricept [Donepezil Hcl] Other (See Comments)    "stomach pain", generic  . Ceclor [Cefaclor]   . Namenda [Memantine Hcl] Other (See Comments)    "dizzy", brand medication  . Ramipril Other (See Comments)     cough    Medications: I have reviewed the patient's current medications.  Results for orders placed or performed during the hospital encounter of 05/13/17 (from the past 48 hour(s))  Comprehensive metabolic panel     Status: Abnormal   Collection Time: 05/13/17  9:40 AM  Result Value Ref Range   Sodium 141 135 - 145 mmol/L   Potassium 3.3 (L) 3.5 -  5.1 mmol/L   Chloride 106 101 - 111 mmol/L   CO2 26 22 - 32 mmol/L   Glucose, Bld 139 (H) 65 - 99 mg/dL   BUN 30 (H) 6 - 20 mg/dL   Creatinine, Ser 1.87 (H) 0.61 - 1.24 mg/dL   Calcium 8.7 (L) 8.9 - 10.3 mg/dL   Total Protein 6.9 6.5 - 8.1 g/dL   Albumin 3.4 (L) 3.5 - 5.0 g/dL   AST 18 15 - 41 U/L   ALT 14 (L) 17 - 63 U/L   Alkaline Phosphatase 77 38 - 126 U/L   Total Bilirubin 0.8 0.3 - 1.2 mg/dL   GFR calc non Af Amer 32 (L) >60 mL/min   GFR calc Af Amer 37 (L) >60 mL/min    Comment: (NOTE) The eGFR has been calculated using the CKD EPI equation. This calculation has not been validated in all clinical situations. eGFR's persistently <60 mL/min signify possible Chronic Kidney Disease.    Anion gap 9 5 - 15  Troponin I     Status: Abnormal   Collection Time: 05/13/17  9:40 AM  Result Value Ref Range    Troponin I 0.03 (HH) <0.03 ng/mL    Comment: CRITICAL RESULT CALLED TO, READ BACK BY AND VERIFIED WITH: WALLACE L. AT 1050A ON [id number] BY THOMPSON S.   Lactic acid, plasma     Status: None   Collection Time: 05/13/17  9:40 AM  Result Value Ref Range   Lactic Acid, Venous 1.6 0.5 - 1.9 mmol/L  CBC with Differential     Status: Abnormal   Collection Time: 05/13/17  9:40 AM  Result Value Ref Range   WBC 18.0 (H) 4.0 - 10.5 K/uL   RBC 4.37 4.22 - 5.81 MIL/uL   Hemoglobin 13.2 13.0 - 17.0 g/dL   HCT 41.8 39.0 - 52.0 %   MCV 95.7 78.0 - 100.0 fL   MCH 30.2 26.0 - 34.0 pg   MCHC 31.6 30.0 - 36.0 g/dL   RDW 14.0 11.5 - 15.5 %   Platelets 188 150 - 400 K/uL   Neutrophils Relative % 88 %   Neutro Abs 15.8 (H) 1.7 - 7.7 K/uL   Lymphocytes Relative 4 %   Lymphs Abs 0.7 0.7 - 4.0 K/uL   Monocytes Relative 8 %   Monocytes Absolute 1.5 (H) 0.1 - 1.0 K/uL   Eosinophils Relative 0 %   Eosinophils Absolute 0.0 0.0 - 0.7 K/uL   Basophils Relative 0 %   Basophils Absolute 0.0 0.0 - 0.1 K/uL  Lipase, blood     Status: None   Collection Time: 05/13/17  9:40 AM  Result Value Ref Range   Lipase 22 11 - 51 U/L  Brain natriuretic peptide     Status: Abnormal   Collection Time: 05/13/17  9:40 AM  Result Value Ref Range   B Natriuretic Peptide 258.0 (H) 0.0 - 100.0 pg/mL  Urinalysis, Routine w reflex microscopic     Status: Abnormal   Collection Time: 05/13/17 10:03 AM  Result Value Ref Range   Color, Urine YELLOW YELLOW   APPearance HAZY (A) CLEAR   Specific Gravity, Urine 1.014 1.005 - 1.030   pH 5.0 5.0 - 8.0   Glucose, UA NEGATIVE NEGATIVE mg/dL   Hgb urine dipstick MODERATE (A) NEGATIVE   Bilirubin Urine NEGATIVE NEGATIVE   Ketones, ur NEGATIVE NEGATIVE mg/dL   Protein, ur NEGATIVE NEGATIVE mg/dL   Nitrite NEGATIVE NEGATIVE   Leukocytes, UA NEGATIVE NEGATIVE   RBC /  HPF 6-30 0 - 5 RBC/hpf   WBC, UA 0-5 0 - 5 WBC/hpf   Bacteria, UA RARE (A) NONE SEEN   Squamous Epithelial / LPF NONE  SEEN NONE SEEN   Mucous PRESENT    Hyaline Casts, UA PRESENT    Granular Casts, UA PRESENT   Culture, blood (routine x 2)     Status: None (Preliminary result)   Collection Time: 05/13/17 12:25 PM  Result Value Ref Range   Specimen Description LEFT ANTECUBITAL    Special Requests      BOTTLES DRAWN AEROBIC AND ANAEROBIC Blood Culture adequate volume   Culture PENDING    Report Status PENDING   Lactic acid, plasma     Status: None   Collection Time: 05/13/17 12:28 PM  Result Value Ref Range   Lactic Acid, Venous 0.8 0.5 - 1.9 mmol/L  Culture, blood (routine x 2)     Status: None (Preliminary result)   Collection Time: 05/13/17 12:33 PM  Result Value Ref Range   Specimen Description BLOOD RIGHT HAND    Special Requests      BOTTLES DRAWN AEROBIC AND ANAEROBIC Blood Culture adequate volume   Culture PENDING    Report Status PENDING     Ct Abdomen Pelvis Wo Contrast  Addendum Date: 05/13/2017   ADDENDUM REPORT: 05/13/2017 11:41 ADDENDUM: Aortic aneurysm NOS (ICD10-I71.9). Electronically Signed   By: Lowella Grip III M.D.   On: 05/13/2017 11:41   Result Date: 05/13/2017 CLINICAL DATA:  Loss of appetite. Weakness. Prostate carcinoma. Abdominal pain. EXAM: CT ABDOMEN AND PELVIS WITHOUT CONTRAST TECHNIQUE: Multidetector CT imaging of the abdomen and pelvis was performed following the standard protocol without and contrast material administration. COMPARISON:  None. FINDINGS: Lower chest: There is bibasilar lung scarring. There is patchy airspace opacity in the left lower lobe consistent with pneumonia. Pacemaker leads are attached to the right atrium and right ventricle. There is a small hiatal hernia. Hepatobiliary: No focal liver lesions are evident on this noncontrast enhanced study. There are foci of calcification and peripheral hepatic artery vessels anteriorly. There are multiple gallstones in the gallbladder. Gallbladder wall does not appear appreciably thickened. There is no biliary  duct dilatation. Pancreas: No pancreatic mass or inflammatory focus. Spleen: No splenic lesions are evident. Adrenals/Urinary Tract: Adrenals appear unremarkable bilaterally. There is no appreciable renal mass on either side. There is moderately severe hydronephrosis on the left. No hydronephrosis on the right. There is a 1 mm calculus in the lower pole the right kidney, nonobstructing. There are several 1 mm calculi in the lower pole left kidney, nonobstructing. There is a calculus in the left ureter at the upper acetabular level measuring 1.0 x 0.8 x 0.6 cm. No other ureteral calculi are evident. Urinary bladder is midline. Urinary bladder wall is borderline thickened. Stomach/Bowel: There is no appreciable bowel wall or mesenteric thickening. There is moderate stool in the colon. No bowel obstruction. No free air or portal venous air. No bowel pneumatosis. Vascular/Lymphatic: There is atherosclerotic calcification throughout the aorta and iliac arterial vessels as well as extending into both common femoral and superficial femoral arteries. Aneurysm dilatation of the mid the distal abdominal aorta is noted with a maximum transverse diameter of 3.3 x 3.2 cm. No periaortic fluid. There is extensive calcification in the major mesenteric vessels. Calcification is marked in the proximal right renal artery. Both common and external iliac arteries are tortuous. There is no appreciable adenopathy in the abdomen or pelvis. Reproductive: There are seed implants in  the prostate. The prostatic size and contour normal. Seminal vesicles appear normal. Note that the right testis is located in the inferior right inguinal ring. Other: No periappendiceal region inflammation. No appreciable abscess or ascites is evident in the abdomen or pelvis. There is a rather minimal ventral hernia containing only fat. Musculoskeletal: There is extensive degenerative change in the lumbar spine. There are no blastic or lytic bone lesions. No  intramuscular or abdominal wall lesion evident. IMPRESSION: 1. There is a calculus in the left ureter at the upper acetabulum level measuring 1.0 x 0.8 x 0.6 cm with moderately severe hydronephrosis on the left. There are small nonobstructing calculi in each kidney. 2. Slight urinary bladder wall thickening, potentially due to inflammation secondary to seed implants in the prostate adjacent to the bladder. 3. Airspace consolidation left lower lobe consistent with pneumonia. 4. Cholelithiasis. Gallbladder wall does not appear appreciably thickened on this study. 5. Mid and distal abdominal aortic aneurysm with maximum transverse diameter of 3.3 x 3.2 cm. Recommend followup by ultrasound in 3 years. This recommendation follows ACR consensus guidelines: White Paper of the ACR Incidental Findings Committee II on Vascular Findings. J Am Coll Radiol 2013; 10:789-794. 6. Extensive atherosclerotic calcification throughout the aorta and major pelvic arterial vessels as well as major mesenteric vessels. Marked atherosclerotic calcification in the proximal right renal artery. In this regard, question whether patient is hypertensive. 7. Right testis in distal inguinal ring. No complicating features in this area. 8. Small hiatal hernia. Minimal ventral hernia containing only fat. Aortic Atherosclerosis (ICD10-I70.0). Electronically Signed: By: Lowella Grip III M.D. On: 05/13/2017 11:35   Dg Chest 2 View  Result Date: 05/13/2017 CLINICAL DATA:  Weakness and cough.  Nausea vomiting EXAM: CHEST  2 VIEW COMPARISON:  06/16/2016 FINDINGS: Mild cardiac enlargement. Dual lead pacemaker unchanged. CABG. Negative for heart failure. Left lower lobe airspace disease has progressed since the study of 06/16/2016. There appears to have been pneumonia in this area on earlier studies. This could represent recurrent pneumonia or progressive scarring. Negative for pleural effusion. Underlying COPD. IMPRESSION: COPD Left lower lobe airspace  disease may represent recurrent pneumonia Electronically Signed   By: Franchot Gallo M.D.   On: 05/13/2017 10:50   Ct Head Wo Contrast  Result Date: 05/13/2017 CLINICAL DATA:  Weakness, confusion. EXAM: CT HEAD WITHOUT CONTRAST TECHNIQUE: Contiguous axial images were obtained from the base of the skull through the vertex without intravenous contrast. COMPARISON:  CT scan of October 10, 2014. FINDINGS: Brain: Mild diffuse cortical atrophy is noted. Stable left frontal encephalomalacia is noted consistent with old infarction. No mass effect or midline shift is noted. Ventricular size is within normal limits. There is no evidence of mass lesion, hemorrhage or acute infarction. Vascular: No hyperdense vessel or unexpected calcification. Skull: Normal. Negative for fracture or focal lesion. Sinuses/Orbits: No acute finding. Other: None. IMPRESSION: Mild diffuse cortical atrophy. Stable old left frontal infarction. No acute intracranial abnormality seen. Electronically Signed   By: Marijo Conception, M.D.   On: 05/13/2017 11:29   US Venous Img Lower Unilateral Right  Result Date: 05/13/2017 CLINICAL DATA:  Right lower extremity edema. EXAM: RIGHT LOWER EXTREMITY VENOUS DOPPLER ULTRASOUND TECHNIQUE: Gray-scale sonography with graded compression, as well as color Doppler and duplex ultrasound were performed to evaluate the lower extremity deep venous systems from the level of the common femoral vein and including the common femoral, femoral, profunda femoral, popliteal and calf veins including the posterior tibial, peroneal and gastrocnemius veins when visible.  The superficial great saphenous vein was also interrogated. Spectral Doppler was utilized to evaluate flow at rest and with distal augmentation maneuvers in the common femoral, femoral and popliteal veins. COMPARISON:  None. FINDINGS: Contralateral Common Femoral Vein: Respiratory phasicity is normal and symmetric with the symptomatic side. No evidence of  thrombus. Normal compressibility. Common Femoral Vein: No evidence of thrombus. Normal compressibility, respiratory phasicity and response to augmentation. Saphenofemoral Junction: No evidence of thrombus. Normal compressibility and flow on color Doppler imaging. Profunda Femoral Vein: No evidence of thrombus. Normal compressibility and flow on color Doppler imaging. Femoral Vein: No evidence of thrombus. Normal compressibility, respiratory phasicity and response to augmentation. Popliteal Vein: No evidence of thrombus. Normal compressibility, respiratory phasicity and response to augmentation. Calf Veins: No evidence of thrombus. Normal compressibility and flow on color Doppler imaging. Other Findings:  None. IMPRESSION: No evidence of DVT within the right lower extremity. Electronically Signed   By: Marcello Moores  Register   On: 05/13/2017 10:37    Review of Systems  Constitutional: Positive for chills, fever and malaise/fatigue.  HENT: Positive for hearing loss.   Eyes: Negative.   Respiratory: Negative.   Cardiovascular: Negative.   Gastrointestinal: Positive for abdominal pain and nausea.  Genitourinary: Negative.   Musculoskeletal: Negative.   Skin: Negative.   Neurological: Positive for speech change.  Endo/Heme/Allergies: Negative.   Psychiatric/Behavioral: Negative.    Blood pressure 121/80, pulse (!) 110, temperature (!) 101.5 F (38.6 C), temperature source Rectal, resp. rate (!) 26, height '5\' 9"'  (1.753 m), weight 89.8 kg (198 lb), SpO2 92 %. Physical Exam  Constitutional: He appears well-developed.  Stigmata of CVA, but well adapted. Wife at bedside.   HENT:  Head: Normocephalic.  Eyes: Pupils are equal, round, and reactive to light.  Neck: Normal range of motion.  Cardiovascular:  Regular tachycardia by bedside monitor  Respiratory: Effort normal.  GI: Soft.  Genitourinary:  Genitourinary Comments: Minimal CVAT at present.   Musculoskeletal: Normal range of motion.   Neurological: He is alert.  Skin: Skin is warm.  Psychiatric: He has a normal mood and affect.    Assessment/Plan:  1 - Left Ureteral Stone - too large to pass. Rec left stent today for renal decompression as may be contributing to infectious state and acute renal failure, then definitive stone management in elective setting after verified clear of infectious parameters.   Risks, benefits, alternatives, expected peri-op course discussed.   2 - Sepsis of Possible Urinary Source -  Unclear if this or pulm source or both. I feel safest plan is left stent as per above from GU perspective.   3 -  Acute Renal Failure -  Likely multifactorial with some pre-renal dehydration and post-renal obstruction from left ureteral stone. Plan for stent for decompression as per above.   Kimiye Strathman 05/13/2017, 2:02 PM

## 2017-05-14 ENCOUNTER — Encounter (HOSPITAL_COMMUNITY): Payer: Self-pay | Admitting: Urology

## 2017-05-14 DIAGNOSIS — J189 Pneumonia, unspecified organism: Secondary | ICD-10-CM

## 2017-05-14 DIAGNOSIS — N179 Acute kidney failure, unspecified: Secondary | ICD-10-CM

## 2017-05-14 LAB — URINE CULTURE

## 2017-05-14 LAB — CBC
HCT: 38.6 % — ABNORMAL LOW (ref 39.0–52.0)
HEMOGLOBIN: 12.4 g/dL — AB (ref 13.0–17.0)
MCH: 30.2 pg (ref 26.0–34.0)
MCHC: 32.1 g/dL (ref 30.0–36.0)
MCV: 93.9 fL (ref 78.0–100.0)
PLATELETS: 163 10*3/uL (ref 150–400)
RBC: 4.11 MIL/uL — AB (ref 4.22–5.81)
RDW: 13.9 % (ref 11.5–15.5)
WBC: 12.4 10*3/uL — ABNORMAL HIGH (ref 4.0–10.5)

## 2017-05-14 LAB — COMPREHENSIVE METABOLIC PANEL
ALBUMIN: 3 g/dL — AB (ref 3.5–5.0)
ALK PHOS: 72 U/L (ref 38–126)
ALT: 11 U/L — AB (ref 17–63)
ANION GAP: 7 (ref 5–15)
AST: 15 U/L (ref 15–41)
BUN: 26 mg/dL — ABNORMAL HIGH (ref 6–20)
CALCIUM: 8.4 mg/dL — AB (ref 8.9–10.3)
CO2: 27 mmol/L (ref 22–32)
CREATININE: 1.47 mg/dL — AB (ref 0.61–1.24)
Chloride: 110 mmol/L (ref 101–111)
GFR calc Af Amer: 49 mL/min — ABNORMAL LOW (ref >60)
GFR calc non Af Amer: 43 mL/min — ABNORMAL LOW (ref >60)
GLUCOSE: 135 mg/dL — AB (ref 65–99)
Potassium: 2.9 mmol/L — ABNORMAL LOW (ref 3.5–5.1)
SODIUM: 144 mmol/L (ref 135–145)
Total Bilirubin: 0.8 mg/dL (ref 0.3–1.2)
Total Protein: 6.3 g/dL — ABNORMAL LOW (ref 6.5–8.1)

## 2017-05-14 LAB — MRSA PCR SCREENING: MRSA by PCR: NEGATIVE

## 2017-05-14 MED ORDER — METOPROLOL TARTRATE 5 MG/5ML IV SOLN: 2.5000 mg | Freq: Four times a day (QID) | INTRAVENOUS | Status: DC | PRN

## 2017-05-14 MED ORDER — APIXABAN 5 MG PO TABS
5.0000 mg | ORAL_TABLET | Freq: Two times a day (BID) | ORAL | Status: DC
  Administered 2017-05-14 – 2017-05-18 (×9): 5 mg via ORAL
  Filled 2017-05-14 (×9): qty 1

## 2017-05-14 MED ORDER — POTASSIUM CHLORIDE 10 MEQ/100ML IV SOLN
10.0000 meq | INTRAVENOUS | Status: AC
  Administered 2017-05-14 (×3): 10 meq via INTRAVENOUS
  Filled 2017-05-14 (×4): qty 100

## 2017-05-14 NOTE — Progress Notes (Signed)
ANTICOAGULATION CONSULT NOTE - Initial Consult  Pharmacy Consult for eliquis Indication: nonvalvular atrial fibrillation Allergies  Allergen Reactions  . Aricept [Donepezil Hcl] Other (See Comments)    "stomach pain", generic  . Ceclor [Cefaclor]   . Namenda [Memantine Hcl] Other (See Comments)    "dizzy", brand medication  . Ramipril Other (See Comments)     cough    Patient Measurements: Height: 5\' 9"  (175.3 cm) Weight: 198 lb (89.8 kg) IBW/kg (Calculated) : 70.7   Vital Signs: Temp: 100.1 F (37.8 C) (08/03 1200) Temp Source: Oral (08/03 1200) BP: 101/50 (08/03 1300) Pulse Rate: 120 (08/03 0800)  Labs:  Recent Labs  05/13/17 0940 05/13/17 1739 05/14/17 0305 05/14/17 0816  HGB 13.2 12.6*  --  12.4*  HCT 41.8 39.5  --  38.6*  PLT 188 180  --  163  CREATININE 1.87* 1.94* 1.47*  --   TROPONINI 0.03*  --   --   --     Estimated Creatinine Clearance: 42.9 mL/min (A) (by C-G formula based on SCr of 1.47 mg/dL (H)).   Medical History: Past Medical History:  Diagnosis Date  . Coronary artery disease    s/p 5v CABG 2002  . CVA (cerebral infarction) 1999  . DDD (degenerative disc disease), cervical   . Depression   . GERD (gastroesophageal reflux disease)   . Hyperlipidemia   . Hypertension   . Memory impairment   . OSA (obstructive sleep apnea)    Recently diagnosed though pt. has not had CPAP trial  . Pacemaker   . Persistent atrial fibrillation ([place])    on pradaxa  . Prostate cancer ([place]) 2006   s/p XRT  . PVD (peripheral vascular disease) ([place])   . Seizures ([place])   . Tachycardia-bradycardia [facility])    s/p PPM by [facility] 6/12    Assessment: 83 y.o.malewith medical history significant of vascular dementia and remote cerebrovascular accident, presents with 3 days of generalized malaise, fevers, chills.  Pt found to be septic and positive for ureteral stone.  Pharmacy consulted to dose eliquis for nonvalvular atrial fibrillation.  Home dose eliquis 5mg  po  twice daily, last dose 8/2 at 0600.  lovenox 40mg  x1 on 8/2 @ 2000  05/14/2017 Scr 1.47 plts 163 H/H slightly low  Goal of Therapy:  Monitor platelets by anticoagulation protocol   Plan:  eliquis 5mg  po twice daily Follow renal function  Dolly Rias RPh 81/12/2016, 2:39 PM Pager [phone]

## 2017-05-14 NOTE — Care Management Note (Signed)
Case Management Note  Patient Details  Name: Zachary Warren MRN: [id number] Date of Birth: Feb 24, 1933  Subjective/Objective:                  CYSTOSCOPY WITH RETROGRADE PYELOGRAM/URETERAL STENT PLACEMENT LEFT (Left)  Action/Plan: Date:  May 14, 2017 Chart reviewed for concurrent status and case management needs. Will continue to follow patient progress. Discharge Planning: following for needs Expected discharge date: [id number] Velva Harman, BSN, [place], [place]  Expected Discharge Date:                  Expected Discharge Plan:  Home/Self Care  In-House Referral:     Discharge planning Services  CM Consult  Post Acute Care Choice:    Choice offered to:     DME Arranged:    DME Agency:     HH Arranged:    HH Agency:     Status of Service:  In process, will continue to follow  If discussed at Long Length of Stay Meetings, dates discussed:    Additional Comments:  Leeroy Cha, RN 05/14/2017, 8:08 AM

## 2017-05-14 NOTE — Evaluation (Signed)
Clinical/Bedside Swallow Evaluation Patient Details  Name: Zachary Warren MRN: [id number] Date of Birth: 1932-10-22  Today's Date: 05/14/2017 Time: SLP Start Time (ACUTE ONLY): 1300 SLP Stop Time (ACUTE ONLY): 1319 SLP Time Calculation (min) (ACUTE ONLY): 19 min  Past Medical History:  Past Medical History:  Diagnosis Date  . Coronary artery disease    s/p 5v CABG 2002  . CVA (cerebral infarction) 1999  . DDD (degenerative disc disease), cervical   . Depression   . GERD (gastroesophageal reflux disease)   . Hyperlipidemia   . Hypertension   . Memory impairment   . OSA (obstructive sleep apnea)    Recently diagnosed though pt. has not had CPAP trial  . Pacemaker   . Persistent atrial fibrillation ([place])    on pradaxa  . Prostate cancer ([place]) 2006   s/p XRT  . PVD (peripheral vascular disease) ([place])   . Seizures ([place])   . Tachycardia-bradycardia [facility])    s/p PPM by [facility] 6/12   Past Surgical History:  Past Surgical History:  Procedure Laterality Date  . CARDIOVERSION N/A 08/21/2013   Procedure: CARDIOVERSION;  Surgeon: Candee Furbish, MD;  Location: [place];  Service: Cardiovascular;  Laterality: N/A;  . [place]  . CORONARY ARTERY BYPASS GRAFT  2002  . CYSTOSCOPY W/ URETERAL STENT PLACEMENT Left 05/13/2017   Procedure: CYSTOSCOPY WITH RETROGRADE PYELOGRAM/URETERAL STENT PLACEMENT LEFT;  Surgeon: Alexis Frock, MD;  Location: [facility];  Service: Urology;  Laterality: Left;  . INSERT / REPLACE / REMOVE PACEMAKER    . PACEMAKER INSERTION     implanted by [facility] 6/12   HPI:  83 y.o.malewith medical history significant for vascular dementia and remote cerebrovascular accident, baseline aphasia, presents with 3 days of generalized malaise, fevers, chills. Symptoms have been persistent, no improving or worsening factors, associated with confusion and worsening disorientation. Dx sepsis; aspiration pna (choking event during eating PTA), AKI. Hx of dysphagia - OP MBS October 2015  revealed moderate dysphagia with delayed swallow, silent aspiration of thin liquids and recs for f/u therapy, thickened liquids.  Further clinical assessment Jan 2016 reiterated recommendations given ongoing s/s of dysphagia and aspiration.  According to his daughter, present today, pt's swallowing has improved in recent months, and pt stopped thickening liquids approximately eight months ago.  She states he eats slowly, but has not had episodes of coughing/choking as he had prior.    Assessment / Plan / Recommendation Clinical Impression  Pt's swallow is functional with adequate attention to boluses, sufficient mastication, no overt s/s of aspiration (however pt with hx of intermittent silent aspiration).  He passed three oz water test without deficit.  His daughter affirms that his eating/drinking has improved the last eight months.  Choking episode was likely isolated incident.  Pt has hx of noncompliance with thickened liquids.  Recommend resuming a regular diet, thin liquids today given excellent clinical performance.  Discussed with dtr, RN.  No SLP f/u warranted.   SLP Visit Diagnosis: Dysphagia, unspecified (R13.10)    Aspiration Risk  Mild aspiration risk    Diet Recommendation   regular diet, thin liquids Medication Administration: Whole meds with puree    Other  Recommendations Oral Care Recommendations: Oral care BID   Follow up Recommendations None      Frequency and Duration            Prognosis        Swallow Study   General HPI: 83 y.o.malewith medical history significant for vascular dementia and  remote cerebrovascular accident, baseline aphasia, presents with 3 days of generalized malaise, fevers, chills. Symptoms have been persistent, no improving or worsening factors, associated with confusion and worsening disorientation. Dx sepsis; aspiration pna (choking event during eating PTA), AKI. Hx of dysphagia - OP MBS October 2015 revealed moderate dysphagia with delayed  swallow, silent aspiration of thin liquids and recs for f/u therapy, thickened liquids.  Further clinical assessment Jan 2016 reiterated recommendations given ongoing s/s of dysphagia and aspiration.  According to his daughter, present today, pt's swallowing has improved in recent months, and pt stopped thickening liquids approximately eight months ago.  She states he eats slowly, but has not had episodes of coughing/choking as he had prior.  Type of Study: Bedside Swallow Evaluation Previous Swallow Assessment: see HPI Diet Prior to this Study: NPO Temperature Spikes Noted: Yes Respiratory Status: Nasal cannula History of Recent Intubation: No Behavior/Cognition: Alert;Cooperative;Pleasant mood Oral Cavity Assessment: Within Functional Limits Oral Care Completed by SLP: No Oral Cavity - Dentition: Adequate natural dentition Vision: Functional for self-feeding Self-Feeding Abilities: Able to feed self Patient Positioning: Upright in bed Baseline Vocal Quality: Normal Volitional Cough: Strong    Oral/Motor/Sensory Function Overall Oral Motor/Sensory Function: Within functional limits   Ice Chips Ice chips: Within functional limits   Thin Liquid Thin Liquid: Within functional limits    Nectar Thick Nectar Thick Liquid: Not tested   Honey Thick Honey Thick Liquid: Not tested   Puree Puree: Within functional limits   Solid   GO   Solid: Within functional limits        Zachary Warren 05/14/2017,1:24 PM   Zachary Warren, [place] CCC/SLP Pager [phone]

## 2017-05-14 NOTE — Progress Notes (Addendum)
PROGRESS NOTE    Zachary Warren  TZG:[id number] DOB: 1933/09/13 DOA: 05/13/2017 PCP: Zachary Low, MD    Brief Narrative: Zachary Warren is a 83 y.o. male with medical history significant of vascular dementia and remote cerebrovascular accident, presents with 3 days of generalized malaise, fevers, chills. Symptoms have been persistent, no improving or worsening factors, associated with confusion and worsening disorientation. This morning the patient had nausea and vomiting. Patient's wife decided to bring him to the hospital for further evaluation. The patient's history is limited due to his dementia, most information obtained from his family members at bedside. No history of nephrolithiasis in the past.  Apparently patient had a severe choking event while eating approximately 3 days ago, no worsening cough or phlegm production.    ED Course: Patient found febrile with septic features, workup with CT abdomen positive for ureteral stone on the left side, possible left lower lobe pneumonia, started on antibiotic therapy, contacted Urology. Referred for admission at [facility] for further Urology evaluation and sepsis management.    Assessment & Plan:   Active Problems:   Sepsis ([place])   Pyelonephritis  1-Sepsis; secondary to UTI, pyelo., PNA;  Continue with IV fluids, IV antibiotics.  IV Levaquin.  WBC trending down.  Follow Blood culture, urine culture.   2-Left pyelonephritis, obstructive uropathy;  CT; left ureter at the upper acetabulum level measuring 1.0 x 0.8 x 0.6 cm with moderately severe hydronephrosis on the left. There are small nonobstructing calculi in each kidney. S/P Left ureteral stent placement He will need 10 to 14 days Tx of antibiotics for pyelo.   3-Aspiration PNA; had shocking episode on food prior to admission.   IV Levaquin.  Speech swallow evaluation.   4-Chronic A fib;  Continue with home dose diltiazem.  Resume apixiban when ok by urology.  Discussed with  Dr Tresa Moore, ok to resume eliquis.   5-Metabolic encephalopathy; secondary to infection.  IV fluids.   6-Hypokalemia; replete IV kcl.     DVT prophylaxis: Lovenox Code Status: Full code.  Family Communication: Care discussed with patient and wife.  Disposition Plan: probably home.    Consultants:   Urology.   Procedures: stent placement.   Antimicrobials: Levaquin.   Subjective: He is feeling better, denies dyspnea. Denies worsening cough.    Objective: Vitals:   05/14/17 0300 05/14/17 0339 05/14/17 0534 05/14/17 0600  BP: (!) 147/91  (!) 174/90 (!) 174/90  Pulse:      Resp: (!) 22  18 19   Temp:  98.9 F (37.2 C)    TempSrc:  Oral    SpO2: 92%  93% (!) 83%  Weight:      Height:        Intake/Output Summary (Last 24 hours) at 05/14/17 0805 Last data filed at 05/14/17 0600  Gross per 24 hour  Intake          1231.25 ml  Output             1600 ml  Net          -368.75 ml   Filed Weights   05/13/17 0922  Weight: 89.8 kg (198 lb)    Examination:  General exam: Appears calm and comfortable  Respiratory system: Clear to auscultation. Respiratory effort normal. Cardiovascular system: S1 & S2 heard, RRR. No JVD, murmurs, rubs, gallops or clicks. No pedal edema. Gastrointestinal system: Abdomen is nondistended, soft and nontender. No organomegaly or masses felt. Normal bowel sounds heard. Central nervous system: Alert and  oriented. No focal neurological deficits. Extremities: Symmetric 5 x 5 power. Skin: No rashes, lesions or ulcers     Data Reviewed: I have personally reviewed following labs and imaging studies  CBC:  Recent Labs Lab 05/13/17 0940 05/13/17 1739  WBC 18.0* 16.5*  NEUTROABS 15.8*  --   HGB 13.2 12.6*  HCT 41.8 39.5  MCV 95.7 92.3  PLT 188 528   Basic Metabolic Panel:  Recent Labs Lab 05/13/17 0940 05/13/17 1739 05/14/17 0305  NA 141  --  144  K 3.3*  --  2.9*  CL 106  --  110  CO2 26  --  27  GLUCOSE 139*  --  135*  BUN  30*  --  26*  CREATININE 1.87* 1.94* 1.47*  CALCIUM 8.7*  --  8.4*   GFR: Estimated Creatinine Clearance: 42.9 mL/min (A) (by C-G formula based on SCr of 1.47 mg/dL (H)). Liver Function Tests:  Recent Labs Lab 05/13/17 0940 05/14/17 0305  AST 18 15  ALT 14* 11*  ALKPHOS 77 72  BILITOT 0.8 0.8  PROT 6.9 6.3*  ALBUMIN 3.4* 3.0*    Recent Labs Lab 05/13/17 0940  LIPASE 22   No results for input(s): AMMONIA in the last 168 hours. Coagulation Profile: No results for input(s): INR, PROTIME in the last 168 hours. Cardiac Enzymes:  Recent Labs Lab 05/13/17 0940  TROPONINI 0.03*   BNP (last 3 results) No results for input(s): PROBNP in the last 8760 hours. HbA1C: No results for input(s): HGBA1C in the last 72 hours. CBG: No results for input(s): GLUCAP in the last 168 hours. Lipid Profile: No results for input(s): CHOL, HDL, LDLCALC, TRIG, CHOLHDL, LDLDIRECT in the last 72 hours. Thyroid Function Tests: No results for input(s): TSH, T4TOTAL, FREET4, T3FREE, THYROIDAB in the last 72 hours. Anemia Panel: No results for input(s): VITAMINB12, FOLATE, FERRITIN, TIBC, IRON, RETICCTPCT in the last 72 hours. Sepsis Labs:  Recent Labs Lab 05/13/17 0940 05/13/17 1228  LATICACIDVEN 1.6 0.8    Recent Results (from the past 240 hour(s))  Culture, blood (routine x 2)     Status: None (Preliminary result)   Collection Time: 05/13/17 12:25 PM  Result Value Ref Range Status   Specimen Description LEFT ANTECUBITAL  Final   Special Requests   Final    BOTTLES DRAWN AEROBIC AND ANAEROBIC Blood Culture adequate volume   Culture PENDING  Incomplete   Report Status PENDING  Incomplete  Culture, blood (routine x 2)     Status: None (Preliminary result)   Collection Time: 05/13/17 12:33 PM  Result Value Ref Range Status   Specimen Description BLOOD RIGHT HAND  Final   Special Requests   Final    BOTTLES DRAWN AEROBIC AND ANAEROBIC Blood Culture adequate volume   Culture PENDING   Incomplete   Report Status PENDING  Incomplete  MRSA PCR Screening     Status: None   Collection Time: 05/13/17  5:00 PM  Result Value Ref Range Status   MRSA by PCR NEGATIVE NEGATIVE Final    Comment:        The GeneXpert MRSA Assay (FDA approved for NASAL specimens only), is one component of a comprehensive MRSA colonization surveillance program. It is not intended to diagnose MRSA infection nor to guide or monitor treatment for MRSA infections.          Radiology Studies: Ct Abdomen Pelvis Wo Contrast  Addendum Date: 05/13/2017   ADDENDUM REPORT: 05/13/2017 11:41 ADDENDUM: Aortic aneurysm NOS (ICD10-I71.9). Electronically  Signed   By: Lowella Grip III M.D.   On: 05/13/2017 11:41   Result Date: 05/13/2017 CLINICAL DATA:  Loss of appetite. Weakness. Prostate carcinoma. Abdominal pain. EXAM: CT ABDOMEN AND PELVIS WITHOUT CONTRAST TECHNIQUE: Multidetector CT imaging of the abdomen and pelvis was performed following the standard protocol without and contrast material administration. COMPARISON:  None. FINDINGS: Lower chest: There is bibasilar lung scarring. There is patchy airspace opacity in the left lower lobe consistent with pneumonia. Pacemaker leads are attached to the right atrium and right ventricle. There is a small hiatal hernia. Hepatobiliary: No focal liver lesions are evident on this noncontrast enhanced study. There are foci of calcification and peripheral hepatic artery vessels anteriorly. There are multiple gallstones in the gallbladder. Gallbladder wall does not appear appreciably thickened. There is no biliary duct dilatation. Pancreas: No pancreatic mass or inflammatory focus. Spleen: No splenic lesions are evident. Adrenals/Urinary Tract: Adrenals appear unremarkable bilaterally. There is no appreciable renal mass on either side. There is moderately severe hydronephrosis on the left. No hydronephrosis on the right. There is a 1 mm calculus in the lower pole the right  kidney, nonobstructing. There are several 1 mm calculi in the lower pole left kidney, nonobstructing. There is a calculus in the left ureter at the upper acetabular level measuring 1.0 x 0.8 x 0.6 cm. No other ureteral calculi are evident. Urinary bladder is midline. Urinary bladder wall is borderline thickened. Stomach/Bowel: There is no appreciable bowel wall or mesenteric thickening. There is moderate stool in the colon. No bowel obstruction. No free air or portal venous air. No bowel pneumatosis. Vascular/Lymphatic: There is atherosclerotic calcification throughout the aorta and iliac arterial vessels as well as extending into both common femoral and superficial femoral arteries. Aneurysm dilatation of the mid the distal abdominal aorta is noted with a maximum transverse diameter of 3.3 x 3.2 cm. No periaortic fluid. There is extensive calcification in the major mesenteric vessels. Calcification is marked in the proximal right renal artery. Both common and external iliac arteries are tortuous. There is no appreciable adenopathy in the abdomen or pelvis. Reproductive: There are seed implants in the prostate. The prostatic size and contour normal. Seminal vesicles appear normal. Note that the right testis is located in the inferior right inguinal ring. Other: No periappendiceal region inflammation. No appreciable abscess or ascites is evident in the abdomen or pelvis. There is a rather minimal ventral hernia containing only fat. Musculoskeletal: There is extensive degenerative change in the lumbar spine. There are no blastic or lytic bone lesions. No intramuscular or abdominal wall lesion evident. IMPRESSION: 1. There is a calculus in the left ureter at the upper acetabulum level measuring 1.0 x 0.8 x 0.6 cm with moderately severe hydronephrosis on the left. There are small nonobstructing calculi in each kidney. 2. Slight urinary bladder wall thickening, potentially due to inflammation secondary to seed implants  in the prostate adjacent to the bladder. 3. Airspace consolidation left lower lobe consistent with pneumonia. 4. Cholelithiasis. Gallbladder wall does not appear appreciably thickened on this study. 5. Mid and distal abdominal aortic aneurysm with maximum transverse diameter of 3.3 x 3.2 cm. Recommend followup by ultrasound in 3 years. This recommendation follows ACR consensus guidelines: White Paper of the ACR Incidental Findings Committee II on Vascular Findings. J Am Coll Radiol 2013; 10:789-794. 6. Extensive atherosclerotic calcification throughout the aorta and major pelvic arterial vessels as well as major mesenteric vessels. Marked atherosclerotic calcification in the proximal right renal artery. In this regard,  question whether patient is hypertensive. 7. Right testis in distal inguinal ring. No complicating features in this area. 8. Small hiatal hernia. Minimal ventral hernia containing only fat. Aortic Atherosclerosis (ICD10-I70.0). Electronically Signed: By: Lowella Grip III M.D. On: 05/13/2017 11:35   Dg Chest 2 View  Result Date: 05/13/2017 CLINICAL DATA:  Weakness and cough.  Nausea vomiting EXAM: CHEST  2 VIEW COMPARISON:  06/16/2016 FINDINGS: Mild cardiac enlargement. Dual lead pacemaker unchanged. CABG. Negative for heart failure. Left lower lobe airspace disease has progressed since the study of 06/16/2016. There appears to have been pneumonia in this area on earlier studies. This could represent recurrent pneumonia or progressive scarring. Negative for pleural effusion. Underlying COPD. IMPRESSION: COPD Left lower lobe airspace disease may represent recurrent pneumonia Electronically Signed   By: Franchot Gallo M.D.   On: 05/13/2017 10:50   Ct Head Wo Contrast  Result Date: 05/13/2017 CLINICAL DATA:  Weakness, confusion. EXAM: CT HEAD WITHOUT CONTRAST TECHNIQUE: Contiguous axial images were obtained from the base of the skull through the vertex without intravenous contrast. COMPARISON:   CT scan of October 10, 2014. FINDINGS: Brain: Mild diffuse cortical atrophy is noted. Stable left frontal encephalomalacia is noted consistent with old infarction. No mass effect or midline shift is noted. Ventricular size is within normal limits. There is no evidence of mass lesion, hemorrhage or acute infarction. Vascular: No hyperdense vessel or unexpected calcification. Skull: Normal. Negative for fracture or focal lesion. Sinuses/Orbits: No acute finding. Other: None. IMPRESSION: Mild diffuse cortical atrophy. Stable old left frontal infarction. No acute intracranial abnormality seen. Electronically Signed   By: Marijo Conception, M.D.   On: 05/13/2017 11:29   US Venous Img Lower Unilateral Right  Result Date: 05/13/2017 CLINICAL DATA:  Right lower extremity edema. EXAM: RIGHT LOWER EXTREMITY VENOUS DOPPLER ULTRASOUND TECHNIQUE: Gray-scale sonography with graded compression, as well as color Doppler and duplex ultrasound were performed to evaluate the lower extremity deep venous systems from the level of the common femoral vein and including the common femoral, femoral, profunda femoral, popliteal and calf veins including the posterior tibial, peroneal and gastrocnemius veins when visible. The superficial great saphenous vein was also interrogated. Spectral Doppler was utilized to evaluate flow at rest and with distal augmentation maneuvers in the common femoral, femoral and popliteal veins. COMPARISON:  None. FINDINGS: Contralateral Common Femoral Vein: Respiratory phasicity is normal and symmetric with the symptomatic side. No evidence of thrombus. Normal compressibility. Common Femoral Vein: No evidence of thrombus. Normal compressibility, respiratory phasicity and response to augmentation. Saphenofemoral Junction: No evidence of thrombus. Normal compressibility and flow on color Doppler imaging. Profunda Femoral Vein: No evidence of thrombus. Normal compressibility and flow on color Doppler imaging.  Femoral Vein: No evidence of thrombus. Normal compressibility, respiratory phasicity and response to augmentation. Popliteal Vein: No evidence of thrombus. Normal compressibility, respiratory phasicity and response to augmentation. Calf Veins: No evidence of thrombus. Normal compressibility and flow on color Doppler imaging. Other Findings:  None. IMPRESSION: No evidence of DVT within the right lower extremity. Electronically Signed   By: Marcello Moores  Register   On: 05/13/2017 10:37   Dg C-arm 1-60 Min-no Report  Result Date: 05/13/2017 Fluoroscopy was utilized by the requesting physician.  No radiographic interpretation.        Scheduled Meds: . atorvastatin  40 mg Oral q1800  . budesonide-formoterol  2 puff Inhalation Daily  . diltiazem  180 mg Oral BID  . enoxaparin (LOVENOX) injection  40 mg Subcutaneous Q24H  .  FLUoxetine  40 mg Oral Daily  . guaiFENesin  600 mg Oral BID  . multivitamin-iron-minerals-folic acid  1 tablet Oral Daily  . omeprazole  20 mg Oral BID AC  . topiramate  50 mg Oral BID   Continuous Infusions: . sodium chloride 75 mL/hr at 05/13/17 1007  . dextrose 5 % and 0.9% NaCl 75 mL/hr at 05/13/17 1715  . [START ON 05/15/2017] levofloxacin (LEVAQUIN) IV    . potassium chloride       LOS: 1 day    Time spent: 35 minutes.     Elmarie Shiley, MD [facility] Pager [phone]  If 7PM-7AM, please contact night-coverage [email or web address] Password [email or web address] 05/14/2017, 8:05 AM

## 2017-05-14 NOTE — Op Note (Signed)
NAME:  YURI, FANA                    ACCOUNT NO.:  MEDICAL RECORD NO.:  [id number]  LOCATION:                                 FACILITY:  PHYSICIAN:  Alexis Frock, MD     DATE OF BIRTH:  1933/07/12  DATE OF PROCEDURE: 05/13/2017                               OPERATIVE REPORT   DIAGNOSES: 1. Left ureteral stone. 2. Urosepsis.  PROCEDURE: 1. Cystoscopy and left retrograde pyelogram and interpretation. 2. Left ureteral stent placement, 6 x 26 Contour, no tether.  ESTIMATED BLOOD LOSS:  Nil.  COMPLICATIONS:  None.  SPECIMEN:  None.  FINDINGS: 1. Moderate hydroureteronephrosis. 2. Mid ureteral stone. 3. Successful placement of left ureteral stent; proximal end, lower     pole; distal end, urinary bladder. 4. Efflux of dark and purulent appearing urine around the distal end     of the stent following placement. 5. Successful placement of Foley catheter, 10 mL of water in the     balloon.  INDICATIONS:  Mr. Hiltz is an 83 year old gentleman with history of extensive vascular disease as well as stroke.  He was found on workup of fevers and malaise to have a pneumonia as well as a left ureteral stone. His urinary infectious parameters were unremarkable, however, his infectious parameters overall with a picture of sepsis were quite impressive, and it was felt that this was somewhat out of proportion to this level of pneumonia.  Urosepsis may be playing a part for possible obstructing stone.  Options were discussed for management including observation and medical therapy alone versus recommended path with urgent ureteral stenting to allow left renal decompression, and the patient and his wife wished to proceed with the latter.  Informed consent was obtained and placed in the medical record.  DESCRIPTION OF PROCEDURE:  The patient being Jerrod Damiano verified. Procedure being left ureteral stent placement was confirmed.  Procedure was carried out.  A time-out was  performed.  Intravenous antibiotics administered.  General endotracheal anesthesia introduced.  The patient was placed into a low lithotomy position.  Sterile field was created by prepping and draping the patient's penis, perineum, proximal thighs using iodine.  Next, cystourethroscopy was performed using 22-French rigid cystoscope with offset lens.  Inspection of the anterior and posterior urethra unremarkable.  There was some fixation of the prostatic urethra.  This was concerning for likely possible prostate radiation.  He does have tattoos in the expected location as well.  The urinary bladder was mildly trabeculated.  Ureteral orifices appeared singleton bilaterally.  The left ureteral orifice was cannulated with a 6-French end-hole catheter and left retrograde pyelogram was obtained.  Left retrograde pyelogram demonstrates a single left ureter with single- system left kidney.  There was a filling defect in the mid ureter consistent with known stone.  There is moderate hydroureteronephrosis. Above this, a 0.038 Zip wire was advanced to the level of the lower pole and well above the area of obstruction and a 6 x 26 Contour-type stent was carefully placed using cystoscopic and fluoroscopic guidance.  Good proximal and distal deployment were noted.  There was efflux of thick and purulent appearing urine around until the distal  end of the stent. Given this, a Foley catheter was placed per urethra to straight drain, 16-French, 10 mL of water in the balloon, and the procedure was terminated.  The patient tolerated the procedure well.  There were no immediate periprocedural complications.  The patient was taken to the postanesthesia care unit in stable condition with plan for continued ICU admission.          ______________________________ Alexis Frock, MD     TM/MEDQ  D:  05/13/2017  T:  05/14/2017  Job:  [id number]

## 2017-05-14 NOTE — Progress Notes (Signed)
1 Day Post-Op   Subjective/Chief Complaint:  1 - Left Ureteral Stone - 1cm left mid ureteral stone by ER CT on eval abdominal pain and fevers. Stone is solitary, 950HU. Minimal perinephric stranding. He is on topamax. Treated with JJ stent 05/13/17.   2 - Sepsis of Possible Urinary Source - Fevers, luekocytosis, malaise x several days prompting ER eval 05/2017. Pneumonia noted. UA without pyuria, no nitrites, no sig bacteruria. Obstructing stone as per above. BCX, UCX 8/2 pending.  Placed on empiric Levaquin.   3 -  Acute Renal Failure - Baseline Cr < 1.0- with rise to 1.8 noted on ER labs 05/2017. Family admits to poor PO intake, left ureteral stone as per above now stented.   Today "Zachary Warren" is stable. Cr down some, Fever curve and leukocytosis also trending down.    Objective: Vital signs in last 24 hours: Temp:  [98.1 F (36.7 C)-100.1 F (37.8 C)] 100.1 F (37.8 C) (08/03 1200) Pulse Rate:  [88-120] 120 (08/03 0800) Resp:  [12-23] 18 (08/03 1300) BP: (95-174)/(44-91) 101/50 (08/03 1300) SpO2:  [83 %-99 %] 97 % (08/03 1300)    Intake/Output from previous day: 08/02 0701 - 08/03 0700 In: 1231.3 [I.V.:981.3; IV Piggyback:250] Out: 1600 [Urine:1600] Intake/Output this shift: Total I/O In: 1350 [I.V.:825; JIRCV:893; IV Piggyback:300] Out: 850 [Urine:850]  General appearance: alert and cooperative Eyes: negative Nose: Nares normal. Septum midline. Mucosa normal. No drainage or sinus tenderness. Throat: lips, mucosa, and tongue normal; teeth and gums normal Neck: supple, symmetrical, trachea midline Back: symmetric, no curvature. ROM normal. No CVA tenderness. Resp: non-labored on Gideon O2. Less coasre.  Cardio: stable tachycardia by bedside monitor.  GI: soft, non-tender; bowel sounds normal; no masses,  no organomegaly Male genitalia: normal, foley in place with tea colored urine.  Extremities: extremities normal, atraumatic, no cyanosis or edema Pulses: 2+ and symmetric Lymph  nodes: Cervical, supraclavicular, and axillary nodes normal. Neurologic: Grossly normal  Lab Results:   Recent Labs  05/13/17 1739 05/14/17 0816  WBC 16.5* 12.4*  HGB 12.6* 12.4*  HCT 39.5 38.6*  PLT 180 163   BMET  Recent Labs  05/13/17 0940 05/13/17 1739 05/14/17 0305  NA 141  --  144  K 3.3*  --  2.9*  CL 106  --  110  CO2 26  --  27  GLUCOSE 139*  --  135*  BUN 30*  --  26*  CREATININE 1.87* 1.94* 1.47*  CALCIUM 8.7*  --  8.4*   PT/INR No results for input(s): LABPROT, INR in the last 72 hours. ABG No results for input(s): PHART, HCO3 in the last 72 hours.  Invalid input(s): PCO2, PO2  Studies/Results: Ct Abdomen Pelvis Wo Contrast  Addendum Date: 05/13/2017   ADDENDUM REPORT: 05/13/2017 11:41 ADDENDUM: Aortic aneurysm NOS (ICD10-I71.9). Electronically Signed   By: Lowella Grip III M.D.   On: 05/13/2017 11:41   Result Date: 05/13/2017 CLINICAL DATA:  Loss of appetite. Weakness. Prostate carcinoma. Abdominal pain. EXAM: CT ABDOMEN AND PELVIS WITHOUT CONTRAST TECHNIQUE: Multidetector CT imaging of the abdomen and pelvis was performed following the standard protocol without and contrast material administration. COMPARISON:  None. FINDINGS: Lower chest: There is bibasilar lung scarring. There is patchy airspace opacity in the left lower lobe consistent with pneumonia. Pacemaker leads are attached to the right atrium and right ventricle. There is a small hiatal hernia. Hepatobiliary: No focal liver lesions are evident on this noncontrast enhanced study. There are foci of calcification and peripheral hepatic artery vessels  anteriorly. There are multiple gallstones in the gallbladder. Gallbladder wall does not appear appreciably thickened. There is no biliary duct dilatation. Pancreas: No pancreatic mass or inflammatory focus. Spleen: No splenic lesions are evident. Adrenals/Urinary Tract: Adrenals appear unremarkable bilaterally. There is no appreciable renal mass on either  side. There is moderately severe hydronephrosis on the left. No hydronephrosis on the right. There is a 1 mm calculus in the lower pole the right kidney, nonobstructing. There are several 1 mm calculi in the lower pole left kidney, nonobstructing. There is a calculus in the left ureter at the upper acetabular level measuring 1.0 x 0.8 x 0.6 cm. No other ureteral calculi are evident. Urinary bladder is midline. Urinary bladder wall is borderline thickened. Stomach/Bowel: There is no appreciable bowel wall or mesenteric thickening. There is moderate stool in the colon. No bowel obstruction. No free air or portal venous air. No bowel pneumatosis. Vascular/Lymphatic: There is atherosclerotic calcification throughout the aorta and iliac arterial vessels as well as extending into both common femoral and superficial femoral arteries. Aneurysm dilatation of the mid the distal abdominal aorta is noted with a maximum transverse diameter of 3.3 x 3.2 cm. No periaortic fluid. There is extensive calcification in the major mesenteric vessels. Calcification is marked in the proximal right renal artery. Both common and external iliac arteries are tortuous. There is no appreciable adenopathy in the abdomen or pelvis. Reproductive: There are seed implants in the prostate. The prostatic size and contour normal. Seminal vesicles appear normal. Note that the right testis is located in the inferior right inguinal ring. Other: No periappendiceal region inflammation. No appreciable abscess or ascites is evident in the abdomen or pelvis. There is a rather minimal ventral hernia containing only fat. Musculoskeletal: There is extensive degenerative change in the lumbar spine. There are no blastic or lytic bone lesions. No intramuscular or abdominal wall lesion evident. IMPRESSION: 1. There is a calculus in the left ureter at the upper acetabulum level measuring 1.0 x 0.8 x 0.6 cm with moderately severe hydronephrosis on the left. There are  small nonobstructing calculi in each kidney. 2. Slight urinary bladder wall thickening, potentially due to inflammation secondary to seed implants in the prostate adjacent to the bladder. 3. Airspace consolidation left lower lobe consistent with pneumonia. 4. Cholelithiasis. Gallbladder wall does not appear appreciably thickened on this study. 5. Mid and distal abdominal aortic aneurysm with maximum transverse diameter of 3.3 x 3.2 cm. Recommend followup by ultrasound in 3 years. This recommendation follows ACR consensus guidelines: White Paper of the ACR Incidental Findings Committee II on Vascular Findings. J Am Coll Radiol 2013; 10:789-794. 6. Extensive atherosclerotic calcification throughout the aorta and major pelvic arterial vessels as well as major mesenteric vessels. Marked atherosclerotic calcification in the proximal right renal artery. In this regard, question whether patient is hypertensive. 7. Right testis in distal inguinal ring. No complicating features in this area. 8. Small hiatal hernia. Minimal ventral hernia containing only fat. Aortic Atherosclerosis (ICD10-I70.0). Electronically Signed: By: Lowella Grip III M.D. On: 05/13/2017 11:35   Dg Chest 2 View  Result Date: 05/13/2017 CLINICAL DATA:  Weakness and cough.  Nausea vomiting EXAM: CHEST  2 VIEW COMPARISON:  06/16/2016 FINDINGS: Mild cardiac enlargement. Dual lead pacemaker unchanged. CABG. Negative for heart failure. Left lower lobe airspace disease has progressed since the study of 06/16/2016. There appears to have been pneumonia in this area on earlier studies. This could represent recurrent pneumonia or progressive scarring. Negative for pleural effusion. Underlying COPD.  IMPRESSION: COPD Left lower lobe airspace disease may represent recurrent pneumonia Electronically Signed   By: Franchot Gallo M.D.   On: 05/13/2017 10:50   Ct Head Wo Contrast  Result Date: 05/13/2017 CLINICAL DATA:  Weakness, confusion. EXAM: CT HEAD WITHOUT  CONTRAST TECHNIQUE: Contiguous axial images were obtained from the base of the skull through the vertex without intravenous contrast. COMPARISON:  CT scan of October 10, 2014. FINDINGS: Brain: Mild diffuse cortical atrophy is noted. Stable left frontal encephalomalacia is noted consistent with old infarction. No mass effect or midline shift is noted. Ventricular size is within normal limits. There is no evidence of mass lesion, hemorrhage or acute infarction. Vascular: No hyperdense vessel or unexpected calcification. Skull: Normal. Negative for fracture or focal lesion. Sinuses/Orbits: No acute finding. Other: None. IMPRESSION: Mild diffuse cortical atrophy. Stable old left frontal infarction. No acute intracranial abnormality seen. Electronically Signed   By: Marijo Conception, M.D.   On: 05/13/2017 11:29   US Venous Img Lower Unilateral Right  Result Date: 05/13/2017 CLINICAL DATA:  Right lower extremity edema. EXAM: RIGHT LOWER EXTREMITY VENOUS DOPPLER ULTRASOUND TECHNIQUE: Gray-scale sonography with graded compression, as well as color Doppler and duplex ultrasound were performed to evaluate the lower extremity deep venous systems from the level of the common femoral vein and including the common femoral, femoral, profunda femoral, popliteal and calf veins including the posterior tibial, peroneal and gastrocnemius veins when visible. The superficial great saphenous vein was also interrogated. Spectral Doppler was utilized to evaluate flow at rest and with distal augmentation maneuvers in the common femoral, femoral and popliteal veins. COMPARISON:  None. FINDINGS: Contralateral Common Femoral Vein: Respiratory phasicity is normal and symmetric with the symptomatic side. No evidence of thrombus. Normal compressibility. Common Femoral Vein: No evidence of thrombus. Normal compressibility, respiratory phasicity and response to augmentation. Saphenofemoral Junction: No evidence of thrombus. Normal compressibility  and flow on color Doppler imaging. Profunda Femoral Vein: No evidence of thrombus. Normal compressibility and flow on color Doppler imaging. Femoral Vein: No evidence of thrombus. Normal compressibility, respiratory phasicity and response to augmentation. Popliteal Vein: No evidence of thrombus. Normal compressibility, respiratory phasicity and response to augmentation. Calf Veins: No evidence of thrombus. Normal compressibility and flow on color Doppler imaging. Other Findings:  None. IMPRESSION: No evidence of DVT within the right lower extremity. Electronically Signed   By: Marcello Moores  Register   On: 05/13/2017 10:37   Dg C-arm 1-60 Min-no Report  Result Date: 05/13/2017 Fluoroscopy was utilized by the requesting physician.  No radiographic interpretation.    Anti-infectives: Anti-infectives    Start     Dose/Rate Route Frequency Ordered Stop   05/15/17 1200  levofloxacin (LEVAQUIN) IVPB 750 mg     750 mg 100 mL/hr over 90 Minutes Intravenous Every 48 hours 05/13/17 1702     05/13/17 1715  levofloxacin (LEVAQUIN) IVPB 750 mg  Status:  Discontinued     750 mg 100 mL/hr over 90 Minutes Intravenous  Once 05/13/17 1700 05/13/17 1716   05/13/17 1200  levofloxacin (LEVAQUIN) IVPB 750 mg     750 mg 100 mL/hr over 90 Minutes Intravenous  Once 05/13/17 1151 05/13/17 1409      Assessment/Plan:  1 - Left Ureteral Stone - now temporized with stent. NO further treatment this hospitalization. Will need ureteroscopy in elective setting after clears infectious parameters.   2 - Sepsis of Possible Urinary Source -  Unclear if this or pulm source or both. Agree with current ABX and plan  to continue IV until afebrile at least 24 hrs.   3 -  Acute Renal Failure -  Likely multifactorial with some pre-renal dehydration and post-renal obstruction from left ureteral stone. This is improving.  OK to DC foley at anytime when not needed for accurate UOP monitoring.   We will arrange for GU follow up in few  weeks at our office and follow the patient PRN over the weekend.   [facility], Yacine Droz 05/14/2017

## 2017-05-14 NOTE — Progress Notes (Signed)
PT Cancellation Note  Patient Details Name: Zachary Warren MRN: [id number] DOB: [birth year]   Cancelled Treatment:    Reason Eval/Treat Not Completed: Medical issues which prohibited therapy. HR high. Just getting medication for rate. RN recommends to check back in PM . ds   Tresa Endo PT [phone]  Claretha Cooper 05/14/2017, 10:35 AM

## 2017-05-15 DIAGNOSIS — N201 Calculus of ureter: Secondary | ICD-10-CM

## 2017-05-15 LAB — BASIC METABOLIC PANEL
Anion gap: 6 (ref 5–15)
BUN: 23 mg/dL — AB (ref 6–20)
CO2: 27 mmol/L (ref 22–32)
CREATININE: 1.25 mg/dL — AB (ref 0.61–1.24)
Calcium: 8.5 mg/dL — ABNORMAL LOW (ref 8.9–10.3)
Chloride: 112 mmol/L — ABNORMAL HIGH (ref 101–111)
GFR, EST NON AFRICAN AMERICAN: 52 mL/min — AB (ref >60)
Glucose, Bld: 135 mg/dL — ABNORMAL HIGH (ref 65–99)
POTASSIUM: 3.1 mmol/L — AB (ref 3.5–5.1)
SODIUM: 145 mmol/L (ref 135–145)

## 2017-05-15 LAB — CBC
HCT: 37.6 % — ABNORMAL LOW (ref 39.0–52.0)
Hemoglobin: 11.7 g/dL — ABNORMAL LOW (ref 13.0–17.0)
MCH: 29.8 pg (ref 26.0–34.0)
MCHC: 31.1 g/dL (ref 30.0–36.0)
MCV: 95.9 fL (ref 78.0–100.0)
PLATELETS: 167 10*3/uL (ref 150–400)
RBC: 3.92 MIL/uL — ABNORMAL LOW (ref 4.22–5.81)
RDW: 14.2 % (ref 11.5–15.5)
WBC: 10.8 10*3/uL — ABNORMAL HIGH (ref 4.0–10.5)

## 2017-05-15 MED ORDER — POTASSIUM CHLORIDE CRYS ER 20 MEQ PO TBCR
40.0000 meq | EXTENDED_RELEASE_TABLET | Freq: Once | ORAL | Status: AC
  Administered 2017-05-15: 40 meq via ORAL
  Filled 2017-05-15: qty 2

## 2017-05-15 MED ORDER — SODIUM CHLORIDE 0.45 % IV SOLN
INTRAVENOUS | Status: DC
  Administered 2017-05-15: 11:00:00 via INTRAVENOUS
  Administered 2017-05-16: 50 mL/h via INTRAVENOUS

## 2017-05-15 NOTE — Progress Notes (Signed)
PROGRESS NOTE    Zachary Warren  WGY:[id number] DOB: 1933/02/28 DOA: 05/13/2017 PCP: Wenda Low, MD    Brief Narrative: Zachary Warren is a 83 y.o. male with medical history significant of vascular dementia and remote cerebrovascular accident, presents with 3 days of generalized malaise, fevers, chills. Symptoms have been persistent, no improving or worsening factors, associated with confusion and worsening disorientation. This morning the patient had nausea and vomiting. Patient's wife decided to bring him to the hospital for further evaluation. The patient's history is limited due to his dementia, most information obtained from his family members at bedside. No history of nephrolithiasis in the past.  Apparently patient had a severe choking event while eating approximately 3 days ago, no worsening cough or phlegm production.    ED Course: Patient found febrile with septic features, workup with CT abdomen positive for ureteral stone on the left side, possible left lower lobe pneumonia, started on antibiotic therapy, contacted Urology. Referred for admission at [facility] for further Urology evaluation and sepsis management.    Assessment & Plan:   Active Problems:   Sepsis ([place])   Pyelonephritis  1-Sepsis; secondary to UTI, pyelo., PNA;  Continue with IV fluids, IV antibiotics.  IV Levaquin.  WBC trending down.  Follow Blood culture; no growth to date. , urine culture; 10,000 colonies insignificant growth.   2-Left pyelonephritis, obstructive uropathy;  CT; left ureter at the upper acetabulum level measuring 1.0 x 0.8 x 0.6 cm with moderately severe hydronephrosis on the left. There are small nonobstructing calculi in each kidney. S/P Left ureteral stent placement He will need 10 to 14 days Tx of antibiotics for pyelo.  Day 3 antibiotics.   3-Aspiration PNA; had shocking episode on food prior to admission.   IV Levaquin.  Speech swallow evaluation.  He pass swallow evaluation.    4-Chronic A fib;  Continue with home dose diltiazem.  Discussed with Dr Tresa Moore, ok to resume eliquis.  Continue with eliquis   5-Metabolic encephalopathy; secondary to infection.  Stable.   6-Hypokalemia; replete orally with 40 meq times one.     DVT prophylaxis: Lovenox Code Status: Full code.  Family Communication: Care discussed with patient and wife.  Disposition Plan: To be determine, await PT evaluation, . Transfer to telemetry    Consultants:   Urology.   Procedures: stent placement.   Antimicrobials: Levaquin.   Subjective: He is alert, eating breakfast.  He denies chest pain, abdominal pain or dyspnea.    Objective: Vitals:   05/15/17 0400 05/15/17 0446 05/15/17 0700 05/15/17 0800  BP: (!) 126/53     Pulse:      Resp: 15  14   Temp:  98.4 F (36.9 C)  99.4 F (37.4 C)  TempSrc:  Oral  Oral  SpO2: 94%  94%   Weight:  86.6 kg (190 lb 14.7 oz)    Height:        Intake/Output Summary (Last 24 hours) at 05/15/17 0807 Last data filed at 05/15/17 0617  Gross per 24 hour  Intake          2810.83 ml  Output             1550 ml  Net          1260.83 ml   Filed Weights   05/13/17 0922 05/15/17 0446  Weight: 89.8 kg (198 lb) 86.6 kg (190 lb 14.7 oz)    Examination:  General exam: NAD Respiratory system: CTA, respiratory effort normal.  Cardiovascular  system: S 1, S 2 RRR Gastrointestinal system: BS present, soft, NT. Central nervous system: Alert, non focal.  Extremities: Symmetric power.  Skin: No rashes, lesions or ulcers     Data Reviewed: I have personally reviewed following labs and imaging studies  CBC:  Recent Labs Lab 05/13/17 0940 05/13/17 1739 05/14/17 0816 05/15/17 0347  WBC 18.0* 16.5* 12.4* 10.8*  NEUTROABS 15.8*  --   --   --   HGB 13.2 12.6* 12.4* 11.7*  HCT 41.8 39.5 38.6* 37.6*  MCV 95.7 92.3 93.9 95.9  PLT 188 180 163 962   Basic Metabolic Panel:  Recent Labs Lab 05/13/17 0940 05/13/17 1739 05/14/17 0305  05/15/17 0347  NA 141  --  144 145  K 3.3*  --  2.9* 3.1*  CL 106  --  110 112*  CO2 26  --  27 27  GLUCOSE 139*  --  135* 135*  BUN 30*  --  26* 23*  CREATININE 1.87* 1.94* 1.47* 1.25*  CALCIUM 8.7*  --  8.4* 8.5*   GFR: Estimated Creatinine Clearance: 49.7 mL/min (A) (by C-G formula based on SCr of 1.25 mg/dL (H)). Liver Function Tests:  Recent Labs Lab 05/13/17 0940 05/14/17 0305  AST 18 15  ALT 14* 11*  ALKPHOS 77 72  BILITOT 0.8 0.8  PROT 6.9 6.3*  ALBUMIN 3.4* 3.0*    Recent Labs Lab 05/13/17 0940  LIPASE 22   No results for input(s): AMMONIA in the last 168 hours. Coagulation Profile: No results for input(s): INR, PROTIME in the last 168 hours. Cardiac Enzymes:  Recent Labs Lab 05/13/17 0940  TROPONINI 0.03*   BNP (last 3 results) No results for input(s): PROBNP in the last 8760 hours. HbA1C: No results for input(s): HGBA1C in the last 72 hours. CBG: No results for input(s): GLUCAP in the last 168 hours. Lipid Profile: No results for input(s): CHOL, HDL, LDLCALC, TRIG, CHOLHDL, LDLDIRECT in the last 72 hours. Thyroid Function Tests: No results for input(s): TSH, T4TOTAL, FREET4, T3FREE, THYROIDAB in the last 72 hours. Anemia Panel: No results for input(s): VITAMINB12, FOLATE, FERRITIN, TIBC, IRON, RETICCTPCT in the last 72 hours. Sepsis Labs:  Recent Labs Lab 05/13/17 0940 05/13/17 1228  LATICACIDVEN 1.6 0.8    Recent Results (from the past 240 hour(s))  Urine culture     Status: Abnormal   Collection Time: 05/13/17 10:03 AM  Result Value Ref Range Status   Specimen Description URINE, CLEAN CATCH  Final   Special Requests NONE  Final   Culture (A)  Final    <10,000 COLONIES/mL INSIGNIFICANT GROWTH Performed at [facility], [place] [address], [place], [place] [address]    Report Status 05/14/2017 FINAL  Final  Culture, blood (routine x 2)     Status: None (Preliminary result)   Collection Time: 05/13/17 12:25 PM  Result Value Ref  Range Status   Specimen Description LEFT ANTECUBITAL  Final   Special Requests   Final    BOTTLES DRAWN AEROBIC AND ANAEROBIC Blood Culture adequate volume   Culture NO GROWTH 2 DAYS  Final   Report Status PENDING  Incomplete  Culture, blood (routine x 2)     Status: None (Preliminary result)   Collection Time: 05/13/17 12:33 PM  Result Value Ref Range Status   Specimen Description BLOOD RIGHT HAND  Final   Special Requests   Final    BOTTLES DRAWN AEROBIC AND ANAEROBIC Blood Culture adequate volume   Culture NO GROWTH 2 DAYS  Final   Report Status PENDING  Incomplete  MRSA PCR Screening     Status: None   Collection Time: 05/13/17  5:00 PM  Result Value Ref Range Status   MRSA by PCR NEGATIVE NEGATIVE Final    Comment:        The GeneXpert MRSA Assay (FDA approved for NASAL specimens only), is one component of a comprehensive MRSA colonization surveillance program. It is not intended to diagnose MRSA infection nor to guide or monitor treatment for MRSA infections.          Radiology Studies: Ct Abdomen Pelvis Wo Contrast  Addendum Date: 05/13/2017   ADDENDUM REPORT: 05/13/2017 11:41 ADDENDUM: Aortic aneurysm NOS (ICD10-I71.9). Electronically Signed   By: Lowella Grip III M.D.   On: 05/13/2017 11:41   Result Date: 05/13/2017 CLINICAL DATA:  Loss of appetite. Weakness. Prostate carcinoma. Abdominal pain. EXAM: CT ABDOMEN AND PELVIS WITHOUT CONTRAST TECHNIQUE: Multidetector CT imaging of the abdomen and pelvis was performed following the standard protocol without and contrast material administration. COMPARISON:  None. FINDINGS: Lower chest: There is bibasilar lung scarring. There is patchy airspace opacity in the left lower lobe consistent with pneumonia. Pacemaker leads are attached to the right atrium and right ventricle. There is a small hiatal hernia. Hepatobiliary: No focal liver lesions are evident on this noncontrast enhanced study. There are foci of calcification and  peripheral hepatic artery vessels anteriorly. There are multiple gallstones in the gallbladder. Gallbladder wall does not appear appreciably thickened. There is no biliary duct dilatation. Pancreas: No pancreatic mass or inflammatory focus. Spleen: No splenic lesions are evident. Adrenals/Urinary Tract: Adrenals appear unremarkable bilaterally. There is no appreciable renal mass on either side. There is moderately severe hydronephrosis on the left. No hydronephrosis on the right. There is a 1 mm calculus in the lower pole the right kidney, nonobstructing. There are several 1 mm calculi in the lower pole left kidney, nonobstructing. There is a calculus in the left ureter at the upper acetabular level measuring 1.0 x 0.8 x 0.6 cm. No other ureteral calculi are evident. Urinary bladder is midline. Urinary bladder wall is borderline thickened. Stomach/Bowel: There is no appreciable bowel wall or mesenteric thickening. There is moderate stool in the colon. No bowel obstruction. No free air or portal venous air. No bowel pneumatosis. Vascular/Lymphatic: There is atherosclerotic calcification throughout the aorta and iliac arterial vessels as well as extending into both common femoral and superficial femoral arteries. Aneurysm dilatation of the mid the distal abdominal aorta is noted with a maximum transverse diameter of 3.3 x 3.2 cm. No periaortic fluid. There is extensive calcification in the major mesenteric vessels. Calcification is marked in the proximal right renal artery. Both common and external iliac arteries are tortuous. There is no appreciable adenopathy in the abdomen or pelvis. Reproductive: There are seed implants in the prostate. The prostatic size and contour normal. Seminal vesicles appear normal. Note that the right testis is located in the inferior right inguinal ring. Other: No periappendiceal region inflammation. No appreciable abscess or ascites is evident in the abdomen or pelvis. There is a rather  minimal ventral hernia containing only fat. Musculoskeletal: There is extensive degenerative change in the lumbar spine. There are no blastic or lytic bone lesions. No intramuscular or abdominal wall lesion evident. IMPRESSION: 1. There is a calculus in the left ureter at the upper acetabulum level measuring 1.0 x 0.8 x 0.6 cm with moderately severe hydronephrosis on the left. There are small nonobstructing calculi in each  kidney. 2. Slight urinary bladder wall thickening, potentially due to inflammation secondary to seed implants in the prostate adjacent to the bladder. 3. Airspace consolidation left lower lobe consistent with pneumonia. 4. Cholelithiasis. Gallbladder wall does not appear appreciably thickened on this study. 5. Mid and distal abdominal aortic aneurysm with maximum transverse diameter of 3.3 x 3.2 cm. Recommend followup by ultrasound in 3 years. This recommendation follows ACR consensus guidelines: White Paper of the ACR Incidental Findings Committee II on Vascular Findings. J Am Coll Radiol 2013; 10:789-794. 6. Extensive atherosclerotic calcification throughout the aorta and major pelvic arterial vessels as well as major mesenteric vessels. Marked atherosclerotic calcification in the proximal right renal artery. In this regard, question whether patient is hypertensive. 7. Right testis in distal inguinal ring. No complicating features in this area. 8. Small hiatal hernia. Minimal ventral hernia containing only fat. Aortic Atherosclerosis (ICD10-I70.0). Electronically Signed: By: Lowella Grip III M.D. On: 05/13/2017 11:35   Dg Chest 2 View  Result Date: 05/13/2017 CLINICAL DATA:  Weakness and cough.  Nausea vomiting EXAM: CHEST  2 VIEW COMPARISON:  06/16/2016 FINDINGS: Mild cardiac enlargement. Dual lead pacemaker unchanged. CABG. Negative for heart failure. Left lower lobe airspace disease has progressed since the study of 06/16/2016. There appears to have been pneumonia in this area on  earlier studies. This could represent recurrent pneumonia or progressive scarring. Negative for pleural effusion. Underlying COPD. IMPRESSION: COPD Left lower lobe airspace disease may represent recurrent pneumonia Electronically Signed   By: Franchot Gallo M.D.   On: 05/13/2017 10:50   Ct Head Wo Contrast  Result Date: 05/13/2017 CLINICAL DATA:  Weakness, confusion. EXAM: CT HEAD WITHOUT CONTRAST TECHNIQUE: Contiguous axial images were obtained from the base of the skull through the vertex without intravenous contrast. COMPARISON:  CT scan of October 10, 2014. FINDINGS: Brain: Mild diffuse cortical atrophy is noted. Stable left frontal encephalomalacia is noted consistent with old infarction. No mass effect or midline shift is noted. Ventricular size is within normal limits. There is no evidence of mass lesion, hemorrhage or acute infarction. Vascular: No hyperdense vessel or unexpected calcification. Skull: Normal. Negative for fracture or focal lesion. Sinuses/Orbits: No acute finding. Other: None. IMPRESSION: Mild diffuse cortical atrophy. Stable old left frontal infarction. No acute intracranial abnormality seen. Electronically Signed   By: Marijo Conception, M.D.   On: 05/13/2017 11:29   US Venous Img Lower Unilateral Right  Result Date: 05/13/2017 CLINICAL DATA:  Right lower extremity edema. EXAM: RIGHT LOWER EXTREMITY VENOUS DOPPLER ULTRASOUND TECHNIQUE: Gray-scale sonography with graded compression, as well as color Doppler and duplex ultrasound were performed to evaluate the lower extremity deep venous systems from the level of the common femoral vein and including the common femoral, femoral, profunda femoral, popliteal and calf veins including the posterior tibial, peroneal and gastrocnemius veins when visible. The superficial great saphenous vein was also interrogated. Spectral Doppler was utilized to evaluate flow at rest and with distal augmentation maneuvers in the common femoral, femoral and  popliteal veins. COMPARISON:  None. FINDINGS: Contralateral Common Femoral Vein: Respiratory phasicity is normal and symmetric with the symptomatic side. No evidence of thrombus. Normal compressibility. Common Femoral Vein: No evidence of thrombus. Normal compressibility, respiratory phasicity and response to augmentation. Saphenofemoral Junction: No evidence of thrombus. Normal compressibility and flow on color Doppler imaging. Profunda Femoral Vein: No evidence of thrombus. Normal compressibility and flow on color Doppler imaging. Femoral Vein: No evidence of thrombus. Normal compressibility, respiratory phasicity and response to augmentation. Popliteal  Vein: No evidence of thrombus. Normal compressibility, respiratory phasicity and response to augmentation. Calf Veins: No evidence of thrombus. Normal compressibility and flow on color Doppler imaging. Other Findings:  None. IMPRESSION: No evidence of DVT within the right lower extremity. Electronically Signed   By: Marcello Moores  Register   On: 05/13/2017 10:37   Dg C-arm 1-60 Min-no Report  Result Date: 05/13/2017 Fluoroscopy was utilized by the requesting physician.  No radiographic interpretation.        Scheduled Meds: . apixaban  5 mg Oral BID  . atorvastatin  40 mg Oral q1800  . budesonide-formoterol  2 puff Inhalation Daily  . diltiazem  180 mg Oral BID  . FLUoxetine  40 mg Oral Daily  . guaiFENesin  600 mg Oral BID  . multivitamin-iron-minerals-folic acid  1 tablet Oral Daily  . omeprazole  20 mg Oral BID AC  . topiramate  50 mg Oral BID   Continuous Infusions: . dextrose 5 % and 0.9% NaCl 100 mL/hr at 05/15/17 0617  . levofloxacin (LEVAQUIN) IV       LOS: 2 days    Time spent: 35 minutes.     Elmarie Shiley, MD [facility] Pager [phone]  If 7PM-7AM, please contact night-coverage [email or web address] Password [email or web address] 05/15/2017, 8:07 AM

## 2017-05-15 NOTE — Progress Notes (Signed)
Bladder scanned 78ml. No bladder distention noted. No pain with palpation.

## 2017-05-15 NOTE — Evaluation (Signed)
Physical Therapy Evaluation Patient Details Name: Zachary Warren MRN: [id number] DOB: 26-Feb-1933 Today's Date: 05/15/2017   History of Present Illness   83 year old male with PMH of dementia and CVA who presented with 3 days of malaise, fevers, chills and generalized weakness, associated worsening confusion. Dx of aspiration PNA, AKI, sepsis 2* pyelonephritis, s/p cystoscopy with L ureteral stent 05/13/17.  Clinical Impression  Pt admitted with above diagnosis. Pt currently with functional limitations due to the deficits listed below (see PT Problem List). At rest pt had uncontrolled HR of 111-140, so performed bed to recliner transfer only. Will assess ambulation once HR controlled.  Pt will benefit from skilled PT to increase their independence and safety with mobility to allow discharge to the venue listed below.       Follow Up Recommendations Home health PT    Equipment Recommendations  None recommended by PT    Recommendations for Other Services       Precautions / Restrictions Precautions Precautions: Other (comment) Precaution Comments: monitor HR Restrictions Weight Bearing Restrictions: No      Mobility  Bed Mobility Overal bed mobility: Modified Independent             General bed mobility comments: increased time/effort, used rails  Transfers Overall transfer level: Needs assistance Equipment used: Rolling walker (2 wheeled) Transfers: Sit to/from [facility] Sit to Stand: Min assist Stand pivot transfers: Min assist       General transfer comment: VCs hand placement; min A to rise from bed, min A to maneuver RW for pivot to recliner; gait deferred 2* uncontrolled HR (RN aware)  Ambulation/Gait             General Gait Details: deferred 2* uncontrolled HR at rest (HR 111-140 at rest)  Stairs            Wheelchair Mobility    Modified Rankin (Stroke Patients Only)       Balance Overall balance assessment: Needs  assistance   Sitting balance-Leahy Scale: Good       Standing balance-Leahy Scale: Fair                               Pertinent Vitals/Pain Pain Assessment: No/denies pain    Home Living Family/patient expects to be discharged to:: Private residence Living Arrangements: Spouse/significant other Available Help at Discharge: Family;Available 24 hours/day   Home Access: Stairs to enter   [employer] of Steps: 2 Home Layout: One level Home Equipment: Shower seat;Grab bars - tub/shower      Prior Function Level of Independence: Independent         Comments: denies falls in past 1 year, walks without AD     Hand Dominance        Extremity/Trunk Assessment   Upper Extremity Assessment Upper Extremity Assessment: Overall WFL for tasks assessed    Lower Extremity Assessment Lower Extremity Assessment: Overall WFL for tasks assessed    Cervical / Trunk Assessment Cervical / Trunk Assessment: Normal  Communication   Communication: No difficulties  Cognition Arousal/Alertness: Awake/alert Behavior During Therapy: WFL for tasks assessed/performed Overall Cognitive Status: History of cognitive impairments - at baseline                                        General Comments      Exercises  Assessment/Plan    PT Assessment Patient needs continued PT services  PT Problem List Decreased mobility;Cardiopulmonary status limiting activity;Decreased activity tolerance       PT Treatment Interventions Gait training;Therapeutic activities;Therapeutic exercise;Patient/family education    PT Goals (Current goals can be found in the Care Plan section)  Acute Rehab PT Goals Patient Stated Goal: likes to play cards PT Goal Formulation: With patient/family Time For Goal Achievement: 05/29/17 Potential to Achieve Goals: Good    Frequency Min 3X/week   Barriers to discharge        Co-evaluation                AM-PAC PT "6 Clicks" Daily Activity  Outcome Measure Difficulty turning over in bed (including adjusting bedclothes, sheets and blankets)?: A Little Difficulty moving from lying on back to sitting on the side of the bed? : A Little Difficulty sitting down on and standing up from a chair with arms (e.g., wheelchair, bedside commode, etc,.)?: Total Help needed moving to and from a bed to chair (including a wheelchair)?: A Little Help needed walking in hospital room?: A Little Help needed climbing 3-5 steps with a railing? : A Lot 6 Click Score: 15    End of Session Equipment Utilized During Treatment: Gait belt Activity Tolerance: Patient tolerated treatment well;Treatment limited secondary to medical complications (Comment) (uncontrolled HR) Patient left: in chair;with call bell/phone within reach;with chair alarm set;with family/visitor present Nurse Communication: Mobility status PT Visit Diagnosis: Difficulty in walking, not elsewhere classified (R26.2)    Time: 6808-8110 PT Time Calculation (min) (ACUTE ONLY): 24 min   Charges:   PT Evaluation $PT Eval Moderate Complexity: 1 Mod PT Treatments $Therapeutic Activity: 8-22 mins   PT G Codes:          Philomena Doheny 05/15/2017, 11:29 AM [phone]

## 2017-05-16 LAB — CBC
HEMATOCRIT: 36.3 % — AB (ref 39.0–52.0)
Hemoglobin: 11.6 g/dL — ABNORMAL LOW (ref 13.0–17.0)
MCH: 29.4 pg (ref 26.0–34.0)
MCHC: 32 g/dL (ref 30.0–36.0)
MCV: 91.9 fL (ref 78.0–100.0)
Platelets: 175 10*3/uL (ref 150–400)
RBC: 3.95 MIL/uL — AB (ref 4.22–5.81)
RDW: 13.5 % (ref 11.5–15.5)
WBC: 10.1 10*3/uL (ref 4.0–10.5)

## 2017-05-16 LAB — BASIC METABOLIC PANEL
ANION GAP: 9 (ref 5–15)
BUN: 19 mg/dL (ref 6–20)
CHLORIDE: 105 mmol/L (ref 101–111)
CO2: 26 mmol/L (ref 22–32)
Calcium: 8.3 mg/dL — ABNORMAL LOW (ref 8.9–10.3)
Creatinine, Ser: 0.94 mg/dL (ref 0.61–1.24)
GFR calc Af Amer: >60 mL/min (ref >60)
GLUCOSE: 111 mg/dL — AB (ref 65–99)
POTASSIUM: 2.9 mmol/L — AB (ref 3.5–5.1)
Sodium: 140 mmol/L (ref 135–145)

## 2017-05-16 MED ORDER — POTASSIUM CHLORIDE CRYS ER 20 MEQ PO TBCR
40.0000 meq | EXTENDED_RELEASE_TABLET | Freq: Once | ORAL | Status: AC
Start: 2017-05-16 — End: 2017-05-16
  Administered 2017-05-16: 40 meq via ORAL
  Filled 2017-05-16: qty 2

## 2017-05-16 MED ORDER — LEVOFLOXACIN 750 MG PO TABS
750.0000 mg | ORAL_TABLET | ORAL | Status: DC
  Administered 2017-05-16: 750 mg via ORAL
  Filled 2017-05-16: qty 1

## 2017-05-16 MED ORDER — LEVOFLOXACIN IN D5W 750 MG/150ML IV SOLN: 750.0000 mg | INTRAVENOUS | Status: DC

## 2017-05-16 MED ORDER — ALBUTEROL SULFATE (2.5 MG/3ML) 0.083% IN NEBU
2.5000 mg | INHALATION_SOLUTION | Freq: Two times a day (BID) | RESPIRATORY_TRACT | Status: DC
  Administered 2017-05-16 – 2017-05-17 (×2): 2.5 mg via RESPIRATORY_TRACT
  Filled 2017-05-16 (×3): qty 3

## 2017-05-16 MED ORDER — TRIAMCINOLONE ACETONIDE 0.1 % EX CREA
TOPICAL_CREAM | Freq: Two times a day (BID) | CUTANEOUS | Status: DC
  Administered 2017-05-16 – 2017-05-17 (×3): via TOPICAL
  Administered 2017-05-17: 1 via TOPICAL
  Administered 2017-05-18: 09:00:00 via TOPICAL
  Filled 2017-05-16: qty 15

## 2017-05-16 MED ORDER — POTASSIUM CHLORIDE 10 MEQ/100ML IV SOLN
10.0000 meq | INTRAVENOUS | Status: AC
  Administered 2017-05-16 (×3): 10 meq via INTRAVENOUS
  Filled 2017-05-16 (×3): qty 100

## 2017-05-16 NOTE — Progress Notes (Signed)
PROGRESS NOTE    Zachary Warren  OJJ:[id number] DOB: [birth year] DOA: 05/13/2017 PCP: Wenda Low, MD    Brief Narrative: Zachary Warren is a 83 y.o. male with medical history significant of vascular dementia and remote cerebrovascular accident, presents with 3 days of generalized malaise, fevers, chills. Symptoms have been persistent, no improving or worsening factors, associated with confusion and worsening disorientation. This morning the patient had nausea and vomiting. Patient's wife decided to bring him to the hospital for further evaluation. The patient's history is limited due to his dementia, most information obtained from his family members at bedside. No history of nephrolithiasis in the past.  Apparently patient had a severe choking event while eating approximately 3 days ago, no worsening cough or phlegm production.    ED Course: Patient found febrile with septic features, workup with CT abdomen positive for ureteral stone on the left side, possible left lower lobe pneumonia, started on antibiotic therapy, contacted Urology. Referred for admission at [facility] for further Urology evaluation and sepsis management.    Assessment & Plan:   Active Problems:   Sepsis ([place])   Pyelonephritis  1-Sepsis; secondary to UTI, pyelo., PNA;  Continue with IV fluids, IV antibiotics.  IV Levaquin. Change to oral. Needs 10 to 14 days treatment,.  WBC trending down.  Follow Blood culture; no growth to date. , urine culture; 10,000 colonies insignificant growth.   2-Left pyelonephritis, obstructive uropathy;  CT; left ureter at the upper acetabulum level measuring 1.0 x 0.8 x 0.6 cm with moderately severe hydronephrosis on the left. There are small nonobstructing calculi in each kidney. S/P Left ureteral stent placement He will need 10 to 14 days Tx of antibiotics for pyelo.  Day 4 antibiotics.   3-Aspiration PNA; had shocking episode on food prior to admission.   IV Levaquin.  Speech  swallow evaluation.  He pass swallow evaluation.  Bilateral ronchus on exam, will order nebulizer, NSL fluids, incentive spirometry.   4-Chronic A fib;  Continue with home dose diltiazem.  Discussed with Dr Tresa Moore, ok to resume eliquis.  Continue with eliquis   5-Metabolic encephalopathy; secondary to infection.  Stable.   6-Hypokalemia; IV kcl and oral supplement     DVT prophylaxis: Lovenox Code Status: Full code.  Family Communication: Care discussed with patient and son who was at bedside.   Disposition Plan: Home in 24 hours if respiratory status stable.    Consultants:   Urology.   Procedures: stent placement.   Antimicrobials: Levaquin.   Subjective: He was not able to sleep last night.  He was trying to have a bowel movement all night, finally had a good BM He is tired, mild dyspnea.    Objective: Vitals:   05/15/17 1145 05/15/17 1200 05/15/17 2250 05/16/17 0500  BP:  112/65 118/78 132/71  Pulse:  100 (!) 111 (!) 115  Resp:  16 18 18   Temp: 99.7 F (37.6 C) 99.2 F (37.3 C) 99.6 F (37.6 C) 99.8 F (37.7 C)  TempSrc: Oral Oral Oral Oral  SpO2:  98% 96% 99%  Weight:      Height:        Intake/Output Summary (Last 24 hours) at 05/16/17 1229 Last data filed at 05/16/17 0923  Gross per 24 hour  Intake           1827.5 ml  Output             1102 ml  Net  725.5 ml   Filed Weights   05/13/17 0922 05/15/17 0446  Weight: 89.8 kg (198 lb) 86.6 kg (190 lb 14.7 oz)    Examination:  General exam: NAD Respiratory system: Normal respiratory effort, bilateral ronchus.  Cardiovascular system: S 1, S 2 RRR Gastrointestinal system: BS present, soft, NT. Central nervous system: Alert, non focal.  Extremities: Symmetric power.  Skin: No rashes, lesions or ulcers     Data Reviewed: I have personally reviewed following labs and imaging studies  CBC:  Recent Labs Lab 05/13/17 0940 05/13/17 1739 05/14/17 0816 05/15/17 0347 05/16/17 0457   WBC 18.0* 16.5* 12.4* 10.8* 10.1  NEUTROABS 15.8*  --   --   --   --   HGB 13.2 12.6* 12.4* 11.7* 11.6*  HCT 41.8 39.5 38.6* 37.6* 36.3*  MCV 95.7 92.3 93.9 95.9 91.9  PLT 188 180 163 167 628   Basic Metabolic Panel:  Recent Labs Lab 05/13/17 0940 05/13/17 1739 05/14/17 0305 05/15/17 0347 05/16/17 0457  NA 141  --  144 145 140  K 3.3*  --  2.9* 3.1* 2.9*  CL 106  --  110 112* 105  CO2 26  --  27 27 26   GLUCOSE 139*  --  135* 135* 111*  BUN 30*  --  26* 23* 19  CREATININE 1.87* 1.94* 1.47* 1.25* 0.94  CALCIUM 8.7*  --  8.4* 8.5* 8.3*   GFR: Estimated Creatinine Clearance: 66.1 mL/min (by C-G formula based on SCr of 0.94 mg/dL). Liver Function Tests:  Recent Labs Lab 05/13/17 0940 05/14/17 0305  AST 18 15  ALT 14* 11*  ALKPHOS 77 72  BILITOT 0.8 0.8  PROT 6.9 6.3*  ALBUMIN 3.4* 3.0*    Recent Labs Lab 05/13/17 0940  LIPASE 22   No results for input(s): AMMONIA in the last 168 hours. Coagulation Profile: No results for input(s): INR, PROTIME in the last 168 hours. Cardiac Enzymes:  Recent Labs Lab 05/13/17 0940  TROPONINI 0.03*   BNP (last 3 results) No results for input(s): PROBNP in the last 8760 hours. HbA1C: No results for input(s): HGBA1C in the last 72 hours. CBG: No results for input(s): GLUCAP in the last 168 hours. Lipid Profile: No results for input(s): CHOL, HDL, LDLCALC, TRIG, CHOLHDL, LDLDIRECT in the last 72 hours. Thyroid Function Tests: No results for input(s): TSH, T4TOTAL, FREET4, T3FREE, THYROIDAB in the last 72 hours. Anemia Panel: No results for input(s): VITAMINB12, FOLATE, FERRITIN, TIBC, IRON, RETICCTPCT in the last 72 hours. Sepsis Labs:  Recent Labs Lab 05/13/17 0940 05/13/17 1228  LATICACIDVEN 1.6 0.8    Recent Results (from the past 240 hour(s))  Urine culture     Status: Abnormal   Collection Time: 05/13/17 10:03 AM  Result Value Ref Range Status   Specimen Description URINE, CLEAN CATCH  Final   Special  Requests NONE  Final   Culture (A)  Final    <10,000 COLONIES/mL INSIGNIFICANT GROWTH Performed at [facility], [place] [address] [place], [place] [address]    Report Status 05/14/2017 FINAL  Final  Culture, blood (routine x 2)     Status: None (Preliminary result)   Collection Time: 05/13/17 12:25 PM  Result Value Ref Range Status   Specimen Description LEFT ANTECUBITAL  Final   Special Requests   Final    BOTTLES DRAWN AEROBIC AND ANAEROBIC Blood Culture adequate volume   Culture NO GROWTH 3 DAYS  Final   Report Status PENDING  Incomplete  Culture, blood (  routine x 2)     Status: None (Preliminary result)   Collection Time: 05/13/17 12:33 PM  Result Value Ref Range Status   Specimen Description BLOOD RIGHT HAND  Final   Special Requests   Final    BOTTLES DRAWN AEROBIC AND ANAEROBIC Blood Culture adequate volume   Culture NO GROWTH 3 DAYS  Final   Report Status PENDING  Incomplete  MRSA PCR Screening     Status: None   Collection Time: 05/13/17  5:00 PM  Result Value Ref Range Status   MRSA by PCR NEGATIVE NEGATIVE Final    Comment:        The GeneXpert MRSA Assay (FDA approved for NASAL specimens only), is one component of a comprehensive MRSA colonization surveillance program. It is not intended to diagnose MRSA infection nor to guide or monitor treatment for MRSA infections.          Radiology Studies: No results found.      Scheduled Meds: . albuterol  2.5 mg Nebulization BID  . apixaban  5 mg Oral BID  . atorvastatin  40 mg Oral q1800  . budesonide-formoterol  2 puff Inhalation Daily  . diltiazem  180 mg Oral BID  . FLUoxetine  40 mg Oral Daily  . guaiFENesin  600 mg Oral BID  . levofloxacin  750 mg Oral Q24H  . multivitamin-iron-minerals-folic acid  1 tablet Oral Daily  . omeprazole  20 mg Oral BID AC  . topiramate  50 mg Oral BID  . triamcinolone cream   Topical BID   Continuous Infusions: . potassium chloride 10 mEq (05/16/17 1217)      LOS: 3 days    Time spent: 35 minutes.     Elmarie Shiley, MD [facility] Pager [phone]  If 7PM-7AM, please contact night-coverage [email or web address] Password [email or web address] 05/16/2017, 12:29 PM

## 2017-05-16 NOTE — Progress Notes (Signed)
Pharmacy Antibiotic Note  Zachary Warren is a 83 y.o. male admitted on 05/13/2017 with pyelonephritis vs pneumonia.  Concern for aspiration.  Pharmacy has been consulted for Levaquin dosing.  Today,. 05/16/2017  Day #4 antibiotics  Renal: AKI resolved  WBC improved to WNL  Afebrile  Cultures unrevealing  Plan:  For improved renal function, adjust levofloxacin to 750mg  IV q24h   Note: can treat PNA and pyelonephritis with shorter course using current levofloxacin dosing.  Suggest duration of therapy = 5-7 days.    Height: 5\' 9"  (175.3 cm) Weight: 190 lb 14.7 oz (86.6 kg) IBW/kg (Calculated) : 70.7  Temp (24hrs), Avg:99.6 F (37.6 C), Min:99.2 F (37.3 C), Max:99.8 F (37.7 C)   Recent Labs Lab 05/13/17 0940 05/13/17 1228 05/13/17 1739 05/14/17 0305 05/14/17 0816 05/15/17 0347 05/16/17 0457  WBC 18.0*  --  16.5*  --  12.4* 10.8* 10.1  CREATININE 1.87*  --  1.94* 1.47*  --  1.25* 0.94  LATICACIDVEN 1.6 0.8  --   --   --   --   --     Estimated Creatinine Clearance: 66.1 mL/min (by C-G formula based on SCr of 0.94 mg/dL).    Allergies  Allergen Reactions  . Aricept [Donepezil Hcl] Other (See Comments)    "stomach pain", generic  . Ceclor [Cefaclor]   . Namenda [Memantine Hcl] Other (See Comments)    "dizzy", brand medication  . Ramipril Other (See Comments)     cough    Antimicrobials this admission: 8/2 Levaquin >>   Dose adjustments this admission:  Microbiology results: 8/2 BCx: NGTD 8/2 UCx: <10k insign growth 8/2 MRSA PCR: neg Thank you for allowing pharmacy to be a part of this patient's care.  Doreene Eland, PharmD, BCPS.   Pager: [phone] 05/16/2017 10:59 AM

## 2017-05-16 NOTE — Evaluation (Signed)
Occupational Therapy Treatment Patient Details Name: Zachary Warren MRN: [id number] DOB: 1933/06/19 Today's Date: 05/16/2017    History of present illness  83 year old male with PMH of dementia and CVA who presented with 3 days of malaise, fevers, chills and generalized weakness, associated worsening confusion. Dx of aspiration PNA, AKI, sepsis 2* pyelonephritis, s/p cystoscopy with L ureteral stent 05/13/17.   OT comments  This 83 y/o M presents with the above. At baseline Pt is independent with ADLs and functional mobility. Pt initially required [place] for functional mobility, progressed to MinA using RW with multimodal cues for safe RW use during session, currently requires ModA for LB ADLs. Pt will benefit from continued acute OT services and post acute OT services to maximize Pt's safety and independence with ADLs and functional mobility.    Follow Up Recommendations  Home health OT;Supervision/Assistance - 24 hour    Equipment Recommendations  3 in 1 bedside commode          Precautions / Restrictions Precautions Precautions: Fall Precaution Comments: monitor HR Restrictions Weight Bearing Restrictions: No       Mobility Bed Mobility Overal bed mobility: Modified Independent             General bed mobility comments: increased time/effort, used rails  Transfers Overall transfer level: Needs assistance Equipment used: Rolling walker (2 wheeled) Transfers: Sit to/from [facility] Sit to Stand: Mod assist Stand pivot transfers: Min assist       General transfer comment: VCs hand placement; assist to power up into standing, min A to maneuver RW for pivot to recliner and max verbal cues for RW use as Pt unfamiliar with using DME    Balance Overall balance assessment: Needs assistance Sitting-balance support: Feet supported;No upper extremity supported Sitting balance-Leahy Scale: Fair Sitting balance - Comments: adjusting socks seated EOB    Standing balance support: Bilateral upper extremity supported;During functional activity Standing balance-Leahy Scale: Fair Standing balance comment: Pt washing hands and performing perihygiene in standing with external steady assist from therapist                            ADL either performed or assessed with clinical judgement   ADL Overall ADL's : Needs assistance/impaired Eating/Feeding: Set up;Sitting   Grooming: Wash/dry hands;Minimal assistance;Standing   Upper Body Bathing: Minimal assistance;Sitting   Lower Body Bathing: Sit to/from stand;Minimal assistance   Upper Body Dressing : Minimal assistance;Sitting   Lower Body Dressing: Sit to/from stand;Moderate assistance Lower Body Dressing Details (indicate cue type and reason): Pt adjusting socks seated EOB using figure 4 technique  Toilet Transfer: Moderate assistance;Stand-pivot;BSC;RW   Toileting- Clothing Manipulation and Hygiene: Moderate assistance;Sit to/from stand Toileting - Clothing Manipulation Details (indicate cue type and reason): assist for gown management; Pt completes perihygiene after BM with therapist assist to ensure cleanliness; Pt requires increased time to complete toileting as Pt having BM     Functional mobility during ADLs: Minimal assistance;Rolling walker General ADL Comments: HR monitored throughout session, remaining below 115 throughout session completion                        Cognition Arousal/Alertness: Awake/alert Behavior During Therapy: WFL for tasks assessed/performed Overall Cognitive Status: History of cognitive impairments - at baseline  General Comments: Pt with hx of dementia                           Pertinent Vitals/ Pain       Pain Assessment: No/denies pain  Home Living Family/patient expects to be discharged to:: Private residence Living Arrangements: Spouse/significant other Available Help at  Discharge: Family;Available 24 hours/day (80% of the time per son report ) Type of Home: Other(Comment) (Condo) Home Access: Stairs to enter Entrance Stairs-Number of Steps: 4 Entrance Stairs-Rails: Right Home Layout: One level     Bathroom Shower/Tub: Occupational psychologist: Handicapped height     Home Equipment: Grab bars - toilet;Grab bars - tub/shower          Prior Functioning/Environment Level of Independence: Independent        Comments: Son reports Pt was independent with ADLs and functional mobility, however leading up to this hospital admisison functional mobility and ADLs have become progressively more difficult    Frequency  Min 2X/week        Progress Toward Goals  OT Goals(current goals can now be found in the care plan section)     Acute Rehab OT Goals Patient Stated Goal: likes to play cards OT Goal Formulation: With patient Time For Goal Achievement: 05/30/17 Potential to Achieve Goals: Good                         AM-PAC PT "6 Clicks" Daily Activity     Outcome Measure   Help from another person eating meals?: None Help from another person taking care of personal grooming?: A Little Help from another person toileting, which includes using toliet, bedpan, or urinal?: A Lot Help from another person bathing (including washing, rinsing, drying)?: A Lot Help from another person to put on and taking off regular upper body clothing?: A Little Help from another person to put on and taking off regular lower body clothing?: A Lot 6 Click Score: 16    End of Session Equipment Utilized During Treatment: Gait belt;Rolling walker  OT Visit Diagnosis: Unsteadiness on feet (R26.81);Muscle weakness (generalized) (M62.81)   Activity Tolerance Patient tolerated treatment well   Patient Left in chair;with call bell/phone within reach;with family/visitor present;with chair alarm set   Nurse Communication Mobility status         Time: 1250-1335 OT Time Calculation (min): 45 min  Charges: OT General Charges $OT Visit: 1 Procedure OT Evaluation $OT Eval Low Complexity: 1 Procedure OT Treatments $Self Care/Home Management : 23-37 mins  Zachary Warren, OT Pager [phone] 05/16/2017   Zachary Warren 05/16/2017, 1:59 PM

## 2017-05-16 NOTE — Progress Notes (Signed)
MD notified that patient pulled out his IV and states it is fine to leave out at this time.

## 2017-05-17 LAB — BASIC METABOLIC PANEL
ANION GAP: 6 (ref 5–15)
BUN: 14 mg/dL (ref 6–20)
CHLORIDE: 109 mmol/L (ref 101–111)
CO2: 28 mmol/L (ref 22–32)
Calcium: 8.9 mg/dL (ref 8.9–10.3)
Creatinine, Ser: 0.84 mg/dL (ref 0.61–1.24)
GFR calc Af Amer: >60 mL/min (ref >60)
GLUCOSE: 117 mg/dL — AB (ref 65–99)
POTASSIUM: 3.3 mmol/L — AB (ref 3.5–5.1)
Sodium: 143 mmol/L (ref 135–145)

## 2017-05-17 MED ORDER — METOPROLOL TARTRATE 25 MG PO TABS
12.5000 mg | ORAL_TABLET | Freq: Two times a day (BID) | ORAL | Status: DC
  Administered 2017-05-17 – 2017-05-18 (×3): 12.5 mg via ORAL
  Filled 2017-05-17 (×3): qty 1

## 2017-05-17 MED ORDER — ALBUTEROL SULFATE (2.5 MG/3ML) 0.083% IN NEBU: 2.5000 mg | INHALATION_SOLUTION | Freq: Four times a day (QID) | RESPIRATORY_TRACT | Status: DC | PRN

## 2017-05-17 MED ORDER — CARVEDILOL 6.25 MG PO TABS: 6.2500 mg | ORAL_TABLET | Freq: Two times a day (BID) | ORAL | Status: DC

## 2017-05-17 MED ORDER — POTASSIUM CHLORIDE CRYS ER 20 MEQ PO TBCR
40.0000 meq | EXTENDED_RELEASE_TABLET | Freq: Once | ORAL | Status: AC
  Administered 2017-05-17: 40 meq via ORAL
  Filled 2017-05-17: qty 2

## 2017-05-17 MED ORDER — AMOXICILLIN-POT CLAVULANATE 875-125 MG PO TABS
1.0000 | ORAL_TABLET | Freq: Two times a day (BID) | ORAL | Status: DC
  Administered 2017-05-17 – 2017-05-18 (×3): 1 via ORAL
  Filled 2017-05-17 (×3): qty 1

## 2017-05-17 NOTE — NC FL2 (Signed)
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, [place]      Provider Number: [id number]  Attending Physician Name and Address:  Elmarie Shiley, MD  Relative Name and Phone Number:       Current Level of Care: Hospital Recommended Level of Care: [place] Prior Approval Number:    Date Approved/Denied:   PASRR Number: [id number]  Discharge Plan: SNF    Current Diagnoses: Patient Active Problem List   Diagnosis Date Noted  . Pyelonephritis 05/13/2017  . Hypokalemia 10/25/2014  . Sepsis ([place]) 10/24/2014  . CAP (community acquired pneumonia) 10/24/2014  . Atrial fibrillation with RVR ([place]) 10/24/2014  . MCI (mild cognitive impairment) with memory loss 07/24/2014  . Cardiac pacemaker in situ 12/25/2013  . Aneurysm ([place]) 06/28/2012  . SIRS (systemic inflammatory response syndrome) ([place]) 02/05/2012  . Encephalopathy acute 02/05/2012  . CVA (cerebral infarction) 02/05/2012  . Seizure ([place]) 02/05/2012  . Dysphagia as late effect of stroke 02/05/2012  . GERD (gastroesophageal reflux disease) 02/05/2012  . 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 Hyperlipidemia 11/21/2010  . Essential hypertension 11/21/2010  . CAD, NATIVE VESSEL 11/21/2010  . Atrial fibrillation ([place]) 11/21/2010  . PVD 11/21/2010    Orientation RESPIRATION BLADDER Height & Weight     Self, Place, Situation  Normal Incontinent Weight: 190 lb 14.7 oz (86.6 kg) Height:  5\' 9"  (175.3 cm)  BEHAVIORAL SYMPTOMS/MOOD NEUROLOGICAL BOWEL NUTRITION STATUS      Incontinent Diet (Regular)  AMBULATORY STATUS COMMUNICATION OF NEEDS Skin   Limited Assist Verbally Normal                       Personal Care Assistance Level of  Assistance  Bathing, Feeding, Dressing Bathing Assistance: Limited assistance Feeding assistance: Limited assistance Dressing Assistance: Limited assistance     Functional Limitations Info             SPECIAL CARE FACTORS FREQUENCY  PT (By licensed PT), OT (By licensed OT)     PT Frequency: 5x OT Frequency: 5x            Contractures Contractures Info: Not present    Additional Factors Info  Code Status, Allergies Code Status Info: Full Code Allergies Info: Ramipril,Namenda Memantine Hcl,Ceclor Cefaclor,Aricept Donepezil Hcl           Current Medications (05/17/2017):  This is the current hospital active medication list Current Facility-Administered Medications  Medication Dose Route Frequency Provider Last Rate Last Dose  . acetaminophen (TYLENOL) tablet 650 mg  650 mg Oral Q6H PRN Arrien, Jimmy Picket, MD   650 mg at 05/16/17 2140   Or  . acetaminophen (TYLENOL) suppository 650 mg  650 mg Rectal Q6H PRN Arrien, Jimmy Picket, MD      . albuterol (PROVENTIL) (2.5 MG/3ML) 0.083% nebulizer solution 2.5 mg  2.5 mg Nebulization Q6H PRN Regalado, Belkys A, MD      . amoxicillin-clavulanate (AUGMENTIN) 875-125 MG per tablet 1 tablet  1 tablet Oral Q12H Regalado, Belkys A, MD   1 tablet at 05/17/17 1051  . apixaban (ELIQUIS) tablet 5 mg  5 mg Oral BID Regalado, Belkys A, MD   5 mg at 05/17/17 0907  . atorvastatin (LIPITOR) tablet 40 mg  40 mg  Oral q1800 Tawni Millers, MD   40 mg at 05/16/17 1649  . budesonide-formoterol (SYMBICORT) 80-4.5 MCG/ACT inhaler 2 puff  2 puff Inhalation Daily Arrien, Jimmy Picket, MD   2 puff at 05/17/17 [phone]  . diltiazem (CARDIZEM CD) 24 hr capsule 180 mg  180 mg Oral BID Tawni Millers, MD   180 mg at 05/17/17 0907  . FLUoxetine (PROZAC) capsule 40 mg  40 mg Oral Daily Arrien, Jimmy Picket, MD   40 mg at 05/17/17 0906  . guaiFENesin (MUCINEX) 12 hr tablet 600 mg  600 mg Oral BID Tawni Millers, MD   600 mg at  05/17/17 0907  . metoprolol tartrate (LOPRESSOR) injection 2.5 mg  2.5 mg Intravenous Q6H PRN Regalado, Belkys A, MD      . multivitamin-iron-minerals-folic acid (CENTRUM) chewable tablet 1 tablet  1 tablet Oral Daily Thomes Lolling, RPH   1 tablet at 05/17/17 6438  . omeprazole (PRILOSEC) capsule 20 mg  20 mg Oral BID AC Thomes Lolling, RPH   20 mg at 05/17/17 3779  . topiramate (TOPAMAX) tablet 50 mg  50 mg Oral BID Tawni Millers, MD   50 mg at 05/17/17 0907  . triamcinolone cream (KENALOG) 0.1 %   Topical BID Regalado, Belkys A, MD   1 application at 39/68/86 0907     Discharge Medications: Please see discharge summary for a list of discharge medications.  Relevant Imaging Results:  Relevant Lab Results:   Additional Information SSN [id number]  Burnis Medin, LCSW

## 2017-05-17 NOTE — Care Management Note (Signed)
Case Management Note  Patient Details  Name: Zachary Warren MRN: [id number] Date of Birth: 01-25-1933  Subjective/Objective:  83 y/o m admitted w/Pyelonephritis. From home. PT recc SNF.CSW following for SNF.                  Action/Plan:d/c SNF.   Expected Discharge Date:                  Expected Discharge Plan:  Skilled Nursing Facility  In-House Referral:  Clinical Social Work  Discharge planning Services  CM Consult  Post Acute Care Choice:    Choice offered to:     DME Arranged:    DME Agency:     HH Arranged:    [name] Agency:     Status of Service:  In process, will continue to follow  If discussed at Long Length of Stay Meetings, dates discussed:    Additional Comments:  Dessa Phi, RN 05/17/2017, 1:41 PM

## 2017-05-17 NOTE — Care Management Important Message (Signed)
Important Message  Patient Details  Name: Zachary Warren MRN: [id number] Date of Birth: 1933/01/07   Medicare Important Message Given:  Yes    Kerin Salen 05/17/2017, 12:03 [place] Message  Patient Details  Name: Zachary Warren MRN: [id number] Date of Birth: 1933/06/19   Medicare Important Message Given:  Yes    Kerin Salen 05/17/2017, 12:02 PM

## 2017-05-17 NOTE — Progress Notes (Signed)
PROGRESS NOTE    Zachary Warren  IRC:[id number] DOB: 08-14-1933 DOA: 05/13/2017 PCP: Wenda Low, MD    Brief Narrative: Zachary Warren is a 83 y.o. male with medical history significant of vascular dementia and remote cerebrovascular accident, presents with 3 days of generalized malaise, fevers, chills. Symptoms have been persistent, no improving or worsening factors, associated with confusion and worsening disorientation. This morning the patient had nausea and vomiting. Patient's wife decided to bring him to the hospital for further evaluation. The patient's history is limited due to his dementia, most information obtained from his family members at bedside. No history of nephrolithiasis in the past.  Apparently patient had a severe choking event while eating approximately 3 days ago, no worsening cough or phlegm production.    ED Course: Patient found febrile with septic features, workup with CT abdomen positive for ureteral stone on the left side, possible left lower lobe pneumonia, started on antibiotic therapy, contacted Urology. Referred for admission at [facility] for further Urology evaluation and sepsis management.    Assessment & Plan:   Active Problems:   Sepsis ([place])   Pyelonephritis  1-Sepsis; secondary to UTI, pyelo., PNA;  Continue with IV fluids, IV antibiotics.  He was initially on IV Levaquin. Change to oral antibiotics, augementin. Needs 10 to 14 days treatment,.  WBC trending down.  Follow Blood culture; no growth to date. , urine culture; 10,000 colonies insignificant growth.   2-Left pyelonephritis, obstructive uropathy;  CT; left ureter at the upper acetabulum level measuring 1.0 x 0.8 x 0.6 cm with moderately severe hydronephrosis on the left. There are small nonobstructing calculi in each kidney. S/P Left ureteral stent placement He will need 10 to 14 days Tx of antibiotics for pyelo.  Day 5 antibiotics.   3-Aspiration PNA; had shocking episode on food prior  to admission.   IV Levaquin.  Speech swallow evaluation.  He pass swallow evaluation.  Nebulizer, change to PRN.  His lung sound better today.   4-Chronic A fib;  Continue with home dose diltiazem.  Discussed with Dr Tresa Moore, ok to resume eliquis.  Continue with eliquis  HR continue to be elevated. Will add metoprolol today.    5-Metabolic encephalopathy; secondary to infection.  He has been more confused, worse at night.  Suspect delirium. Will change Levaquin to Augmentin.   6-Hypokalemia; replete orally.     DVT prophylaxis: Code Status: Full code.  Family Communication: Care discussed with patient and son who was at bedside.   Disposition Plan: Home in 24 hours if respiratory status stable.    Consultants:   Urology.   Procedures: stent placement.   Antimicrobials: Levaquin.   Subjective: Unable to sleep last night. He gets confuse in the afternoon and at night.  His HR has been elevated.  He is sleepy, but wake up and answer questions. Denies pain    Objective: Vitals:   05/16/17 2002 05/16/17 2110 05/17/17 0422 05/17/17 0857  BP:  127/77 (!) 143/65   Pulse:  (!) 115 (!) 147   Resp:  18 18   Temp:  100.1 F (37.8 C) 98.4 F (36.9 C)   TempSrc:  Oral Oral   SpO2: 94% 92% 96% 93%  Weight:      Height:        Intake/Output Summary (Last 24 hours) at 05/17/17 1348 Last data filed at 05/16/17 2020  Gross per 24 hour  Intake  0 ml  Output               50 ml  Net              -50 ml   Filed Weights   05/13/17 0922 05/15/17 0446  Weight: 89.8 kg (198 lb) 86.6 kg (190 lb 14.7 oz)    Examination:  General exam: NAD Respiratory system: Normal respiratory effort, CTA Cardiovascular system: S 1, S 2 IRR Gastrointestinal system: BS present, soft, nt Central nervous system; non focal.  Extremities: Symmetric power.  Skin: No rashes, lesions or ulcers     Data Reviewed: I have personally reviewed following labs and imaging  studies  CBC:  Recent Labs Lab 05/13/17 0940 05/13/17 1739 05/14/17 0816 05/15/17 0347 05/16/17 0457  WBC 18.0* 16.5* 12.4* 10.8* 10.1  NEUTROABS 15.8*  --   --   --   --   HGB 13.2 12.6* 12.4* 11.7* 11.6*  HCT 41.8 39.5 38.6* 37.6* 36.3*  MCV 95.7 92.3 93.9 95.9 91.9  PLT 188 180 163 167 144   Basic Metabolic Panel:  Recent Labs Lab 05/13/17 0940 05/13/17 1739 05/14/17 0305 05/15/17 0347 05/16/17 0457 05/17/17 0812  NA 141  --  144 145 140 143  K 3.3*  --  2.9* 3.1* 2.9* 3.3*  CL 106  --  110 112* 105 109  CO2 26  --  27 27 26 28   GLUCOSE 139*  --  135* 135* 111* 117*  BUN 30*  --  26* 23* 19 14  CREATININE 1.87* 1.94* 1.47* 1.25* 0.94 0.84  CALCIUM 8.7*  --  8.4* 8.5* 8.3* 8.9   GFR: Estimated Creatinine Clearance: 73.9 mL/min (by C-G formula based on SCr of 0.84 mg/dL). Liver Function Tests:  Recent Labs Lab 05/13/17 0940 05/14/17 0305  AST 18 15  ALT 14* 11*  ALKPHOS 77 72  BILITOT 0.8 0.8  PROT 6.9 6.3*  ALBUMIN 3.4* 3.0*    Recent Labs Lab 05/13/17 0940  LIPASE 22   No results for input(s): AMMONIA in the last 168 hours. Coagulation Profile: No results for input(s): INR, PROTIME in the last 168 hours. Cardiac Enzymes:  Recent Labs Lab 05/13/17 0940  TROPONINI 0.03*   BNP (last 3 results) No results for input(s): PROBNP in the last 8760 hours. HbA1C: No results for input(s): HGBA1C in the last 72 hours. CBG: No results for input(s): GLUCAP in the last 168 hours. Lipid Profile: No results for input(s): CHOL, HDL, LDLCALC, TRIG, CHOLHDL, LDLDIRECT in the last 72 hours. Thyroid Function Tests: No results for input(s): TSH, T4TOTAL, FREET4, T3FREE, THYROIDAB in the last 72 hours. Anemia Panel: No results for input(s): VITAMINB12, FOLATE, FERRITIN, TIBC, IRON, RETICCTPCT in the last 72 hours. Sepsis Labs:  Recent Labs Lab 05/13/17 0940 05/13/17 1228  LATICACIDVEN 1.6 0.8    Recent Results (from the past 240 hour(s))  Urine  culture     Status: Abnormal   Collection Time: 05/13/17 10:03 AM  Result Value Ref Range Status   Specimen Description URINE, CLEAN CATCH  Final   Special Requests NONE  Final   Culture (A)  Final    <10,000 COLONIES/mL INSIGNIFICANT GROWTH Performed at [facility], [place] [address], [place], [place] [address]    Report Status 05/14/2017 FINAL  Final  Culture, blood (routine x 2)     Status: None (Preliminary result)   Collection Time: 05/13/17 12:25 PM  Result Value Ref Range Status   Specimen Description  LEFT ANTECUBITAL  Final   Special Requests   Final    BOTTLES DRAWN AEROBIC AND ANAEROBIC Blood Culture adequate volume   Culture NO GROWTH 4 DAYS  Final   Report Status PENDING  Incomplete  Culture, blood (routine x 2)     Status: None (Preliminary result)   Collection Time: 05/13/17 12:33 PM  Result Value Ref Range Status   Specimen Description BLOOD RIGHT HAND  Final   Special Requests   Final    BOTTLES DRAWN AEROBIC AND ANAEROBIC Blood Culture adequate volume   Culture NO GROWTH 4 DAYS  Final   Report Status PENDING  Incomplete  MRSA PCR Screening     Status: None   Collection Time: 05/13/17  5:00 PM  Result Value Ref Range Status   MRSA by PCR NEGATIVE NEGATIVE Final    Comment:        The GeneXpert MRSA Assay (FDA approved for NASAL specimens only), is one component of a comprehensive MRSA colonization surveillance program. It is not intended to diagnose MRSA infection nor to guide or monitor treatment for MRSA infections.          Radiology Studies: No results found.      Scheduled Meds: . amoxicillin-clavulanate  1 tablet Oral Q12H  . apixaban  5 mg Oral BID  . atorvastatin  40 mg Oral q1800  . budesonide-formoterol  2 puff Inhalation Daily  . diltiazem  180 mg Oral BID  . FLUoxetine  40 mg Oral Daily  . guaiFENesin  600 mg Oral BID  . multivitamin-iron-minerals-folic acid  1 tablet Oral Daily  . omeprazole  20 mg Oral BID AC  .  topiramate  50 mg Oral BID  . triamcinolone cream   Topical BID   Continuous Infusions:    LOS: 4 days    Time spent: 35 minutes.     Elmarie Shiley, MD [facility] Pager [phone]  If 7PM-7AM, please contact night-coverage [email or web address] Password [email or web address] 05/17/2017, 1:48 PM

## 2017-05-17 NOTE — Progress Notes (Addendum)
Physical Therapy Treatment Patient Details Name: Zachary Warren MRN: [id number] DOB: August 02, 1933 Today's Date: 05/17/2017    History of Present Illness  83 year old male with PMH of dementia and CVA who presented with 3 days of malaise, fevers, chills and generalized weakness, associated worsening confusion. Dx of aspiration PNA, AKI, sepsis 2* pyelonephritis, s/p cystoscopy with L ureteral stent 05/13/17.    PT Comments    Pt progressing with mobility, he ambulated 79' with hand held assist of 2 people, HR 125-135 with unsustained spike to 142. Pt lethargic initially, per family he was awake much of the night. Verbal/manual cues required for safety with mobility due to dementia.  Pt's wife not able to provide physical assistance to pt due to 2 prior back surgeries, so  PT now recommending ST-SNF .   Follow Up Recommendations  SNF     Equipment Recommendations  None recommended by PT    Recommendations for Other Services       Precautions / Restrictions Precautions Precautions: Fall Precaution Comments: monitor HR Restrictions Weight Bearing Restrictions: No    Mobility  Bed Mobility Overal bed mobility: Needs Assistance Bed Mobility: Supine to Sit     Supine to sit: Min assist     General bed mobility comments: min A to initiate movement  Transfers Overall transfer level: Needs assistance Equipment used: Rolling walker (2 wheeled) Transfers: Sit to/from [facility] Sit to Stand: Mod assist         General transfer comment: VCs hand placement; assist to power up into standing  Ambulation/Gait Ambulation/Gait assistance: Min assist Ambulation Distance (Feet): 80 Feet Assistive device: 2 person hand held assist Gait Pattern/deviations: Decreased step length - right;Decreased step length - left;Step-through pattern   Gait velocity interpretation: at or above normal speed for age/gender General Gait Details: HHA of 2 for balance, HR 125-135 walking  with unsustained spike to 142   Stairs            Wheelchair Mobility    Modified Rankin (Stroke Patients Only)       Balance Overall balance assessment: Needs assistance Sitting-balance support: Feet supported;No upper extremity supported Sitting balance-Leahy Scale: Fair Sitting balance - Comments: adjusting socks seated EOB   Standing balance support: Bilateral upper extremity supported;During functional activity Standing balance-Leahy Scale: Fair                              Cognition Arousal/Alertness: Awake/alert Behavior During Therapy: WFL for tasks assessed/performed Overall Cognitive Status: History of cognitive impairments - at baseline                                 General Comments: Pt with hx of dementia       Exercises      General Comments        Pertinent Vitals/Pain Pain Assessment: No/denies pain    Home Living                      Prior Function            PT Goals (current goals can now be found in the care plan section) Acute Rehab PT Goals Patient Stated Goal: likes to play cards PT Goal Formulation: With patient/family Time For Goal Achievement: 05/29/17 Potential to Achieve Goals: Good Progress towards PT goals: Progressing toward goals    Frequency  Min 3X/week      PT Plan Discharge plan needs to be updated    Co-evaluation              AM-PAC PT "6 Clicks" Daily Activity  Outcome Measure  Difficulty turning over in bed (including adjusting bedclothes, sheets and blankets)?: Total Difficulty moving from lying on back to sitting on the side of the bed? : Total Difficulty sitting down on and standing up from a chair with arms (e.g., wheelchair, bedside commode, etc,.)?: Total Help needed moving to and from a bed to chair (including a wheelchair)?: A Little Help needed walking in hospital room?: A Little Help needed climbing 3-5 steps with a railing? : A Lot 6 Click  Score: 11    End of Session Equipment Utilized During Treatment: Gait belt Activity Tolerance: Patient tolerated treatment well;Treatment limited secondary to medical complications (Comment) (uncontrolled HR) Patient left: in chair;with call bell/phone within reach;with chair alarm set;with family/visitor present Nurse Communication: Mobility status PT Visit Diagnosis: Difficulty in walking, not elsewhere classified (R26.2)     Time: 4784-1282 PT Time Calculation (min) (ACUTE ONLY): 20 min  Charges:  $Gait Training: 8-22 mins                    G Codes:          Philomena Doheny 05/17/2017, 10:31 AM [phone]

## 2017-05-17 NOTE — Clinical Social Work Note (Signed)
Clinical Social Work Assessment  Patient Details  Name: Zachary Warren MRN: [id number] Date of Birth: 11-12-1932  Date of referral:  05/17/17               Reason for consult:  Facility Placement                Permission sought to share information with:  Chartered certified accountant granted to share information::  Yes, Verbal Permission Granted  Name::        Agency::     Relationship::     Contact Information:     Housing/Transportation Living arrangements for the past 2 months:  Apartment Source of Information:  Spouse, Other (Comment Required) (Wife Blas Riches Niece Herbie Saxon) Patient Interpreter Needed:  None Criminal Activity/Legal Involvement Pertinent to Current Situation/Hospitalization:  No - Comment as needed Significant Relationships:  Other Family Members, Spouse Lives with:  Spouse Do you feel safe going back to the place where you live?   (PT recommended SNF) Need for family participation in patient care:  Yes (Comment)  Care giving concerns: Patient resides with wife, prior to hospitalization patient's wife reported that patient was independent with ambulance and ADLs. PT recommended SNF for ST rehab.    Social Worker assessment / plan:  CSW spoke with patient's wife and niece at bedside. Patient asleep, has hx dementia unable to participate in assessment. CSW and patient's wife and niece discussed PT recommendation for ST rehab at [facility]. Patient's wife reported that she is agreeable and that patient has not been to SNF in the past. CSW explained SNF placement process. Patient's wife provided CSW with preferred SNFs. CSW will complete FL2 and assist patient's wife with discharge planning.   Employment status:  Retired Forensic scientist:  Medicare PT Recommendations:  Impact / Referral to community resources:  [place]  Patient/Family's Response to care:  Patient's wife reported that patient has  received excellent care at the hospital. Patient's wife thanked CSW for assistance with SNF placement.   Patient/Family's Understanding of and Emotional Response to Diagnosis, Current Treatment, and Prognosis:  Patient's wife verbalized understanding of patient's diagnosis and current treatment. Patient's wife supportive and involved in patient's care.    Emotional Assessment Appearance:  Appears stated age Attitude/Demeanor/Rapport:  Unable to Assess Affect (typically observed):  Unable to Assess Orientation:  Oriented to Self, Oriented to Place Alcohol / Substance use:  Not Applicable Psych involvement (Current and /or in the community):  No (Comment)  Discharge Needs  Concerns to be addressed:  No discharge needs identified Readmission within the last 30 days:  No Current discharge risk:  None Barriers to Discharge:  No Barriers Identified   Burnis Medin, LCSW 05/17/2017, 12:38 PM

## 2017-05-17 NOTE — Progress Notes (Addendum)
ANTICOAGULATION CONSULT NOTE - [facility] for Apixaban Indication: nonvalvular atrial fibrillation Allergies  Allergen Reactions  . Aricept [Donepezil Hcl] Other (See Comments)    "stomach pain", generic  . Ceclor [Cefaclor]   . Namenda [Memantine Hcl] Other (See Comments)    "dizzy", brand medication  . Ramipril Other (See Comments)     cough    Patient Measurements: Height: 5\' 9"  (175.3 cm) Weight: 190 lb 14.7 oz (86.6 kg) IBW/kg (Calculated) : 70.7   Vital Signs: Temp: 98.4 F (36.9 C) (08/06 0422) Temp Source: Oral (08/06 0422) BP: 143/65 (08/06 0422) Pulse Rate: 147 (08/06 0422)  Labs:  Recent Labs  05/15/17 0347 05/16/17 0457 05/17/17 0812  HGB 11.7* 11.6*  --   HCT 37.6* 36.3*  --   PLT 167 175  --   CREATININE 1.25* 0.94 0.84    Estimated Creatinine Clearance: 73.9 mL/min (by C-G formula based on SCr of 0.84 mg/dL).   Medical History: Past Medical History:  Diagnosis Date  . Coronary artery disease    s/p 5v CABG 2002  . CVA (cerebral infarction) 1999  . DDD (degenerative disc disease), cervical   . Depression   . GERD (gastroesophageal reflux disease)   . Hyperlipidemia   . Hypertension   . Memory impairment   . OSA (obstructive sleep apnea)    Recently diagnosed though pt. has not had CPAP trial  . Pacemaker   . Persistent atrial fibrillation ([place])    on pradaxa  . Prostate cancer ([place]) 2006   s/p XRT  . PVD (peripheral vascular disease) ([place])   . Seizures ([place])   . Tachycardia-bradycardia [facility])    s/p PPM by [facility] 6/12    Assessment: 66 y/oMwith PMH of vascular dementia, remote cerebrovascular accident, a-fib who presented to [facility] ED on 05/13/2017 with 3 days of generalized malaise, fevers, chills.  Patient found to be septic secondary to pyelonephritis and aspiration pneumonia. Pharmacy consulted to dose Apixaban for nonvalvular atrial fibrillation.  Home dose of Apixaban is 5mg  po twice daily, last dose 05/13/2017 at  0600.   Today, 05/17/17  SCr improved to 0.84  Weight = 86.6 kg  H/H stable, Pltc WNL (05/16/2017)   Plan:  Continue home dose of Apixaban 5mg  PO BID Monitor renal function, CBC, and for s/sx of bleeding   Lindell Spar, PharmD, BCPS Pager: [phone] 05/17/2017 10:45 AM

## 2017-05-17 NOTE — Progress Notes (Signed)
PT Cancellation Note  Patient Details Name: Zachary Warren MRN: [id number] DOB: Jun 07, 1933   Cancelled Treatment:    Reason Eval/Treat Not Completed: Medical issues which prohibited therapy (HR uncontrolled at rest (112-135), will check back after cardizem dose. )   Philomena Doheny 05/17/2017, 9:25 AM [phone]

## 2017-05-18 ENCOUNTER — Encounter (HOSPITAL_COMMUNITY): Payer: Self-pay | Admitting: Urology

## 2017-05-18 DIAGNOSIS — F0280 Dementia in other diseases classified elsewhere without behavioral disturbance: Secondary | ICD-10-CM | POA: Diagnosis not present

## 2017-05-18 DIAGNOSIS — G4730 Sleep apnea, unspecified: Secondary | ICD-10-CM | POA: Diagnosis not present

## 2017-05-18 DIAGNOSIS — I509 Heart failure, unspecified: Secondary | ICD-10-CM | POA: Diagnosis not present

## 2017-05-18 DIAGNOSIS — N139 Obstructive and reflux uropathy, unspecified: Secondary | ICD-10-CM | POA: Diagnosis not present

## 2017-05-18 DIAGNOSIS — N200 Calculus of kidney: Secondary | ICD-10-CM | POA: Diagnosis not present

## 2017-05-18 DIAGNOSIS — J189 Pneumonia, unspecified organism: Secondary | ICD-10-CM | POA: Diagnosis not present

## 2017-05-18 DIAGNOSIS — I6789 Other cerebrovascular disease: Secondary | ICD-10-CM | POA: Diagnosis not present

## 2017-05-18 DIAGNOSIS — Z923 Personal history of irradiation: Secondary | ICD-10-CM | POA: Diagnosis not present

## 2017-05-18 DIAGNOSIS — Z8673 Personal history of transient ischemic attack (TIA), and cerebral infarction without residual deficits: Secondary | ICD-10-CM | POA: Diagnosis not present

## 2017-05-18 DIAGNOSIS — G9341 Metabolic encephalopathy: Secondary | ICD-10-CM | POA: Diagnosis not present

## 2017-05-18 DIAGNOSIS — Z87891 Personal history of nicotine dependence: Secondary | ICD-10-CM | POA: Diagnosis not present

## 2017-05-18 DIAGNOSIS — I10 Essential (primary) hypertension: Secondary | ICD-10-CM | POA: Diagnosis not present

## 2017-05-18 DIAGNOSIS — M6281 Muscle weakness (generalized): Secondary | ICD-10-CM | POA: Diagnosis not present

## 2017-05-18 DIAGNOSIS — J449 Chronic obstructive pulmonary disease, unspecified: Secondary | ICD-10-CM | POA: Diagnosis not present

## 2017-05-18 DIAGNOSIS — F0390 Unspecified dementia without behavioral disturbance: Secondary | ICD-10-CM | POA: Diagnosis not present

## 2017-05-18 DIAGNOSIS — R4182 Altered mental status, unspecified: Secondary | ICD-10-CM | POA: Diagnosis not present

## 2017-05-18 DIAGNOSIS — N202 Calculus of kidney with calculus of ureter: Secondary | ICD-10-CM | POA: Diagnosis not present

## 2017-05-18 DIAGNOSIS — I739 Peripheral vascular disease, unspecified: Secondary | ICD-10-CM | POA: Diagnosis not present

## 2017-05-18 DIAGNOSIS — I481 Persistent atrial fibrillation: Secondary | ICD-10-CM | POA: Diagnosis not present

## 2017-05-18 DIAGNOSIS — A419 Sepsis, unspecified organism: Secondary | ICD-10-CM | POA: Diagnosis not present

## 2017-05-18 DIAGNOSIS — E876 Hypokalemia: Secondary | ICD-10-CM | POA: Diagnosis not present

## 2017-05-18 DIAGNOSIS — Z950 Presence of cardiac pacemaker: Secondary | ICD-10-CM | POA: Diagnosis not present

## 2017-05-18 DIAGNOSIS — Z951 Presence of aortocoronary bypass graft: Secondary | ICD-10-CM | POA: Diagnosis not present

## 2017-05-18 DIAGNOSIS — F0391 Unspecified dementia with behavioral disturbance: Secondary | ICD-10-CM | POA: Diagnosis not present

## 2017-05-18 DIAGNOSIS — R278 Other lack of coordination: Secondary | ICD-10-CM | POA: Diagnosis not present

## 2017-05-18 DIAGNOSIS — I69920 Aphasia following unspecified cerebrovascular disease: Secondary | ICD-10-CM | POA: Diagnosis not present

## 2017-05-18 DIAGNOSIS — J168 Pneumonia due to other specified infectious organisms: Secondary | ICD-10-CM | POA: Diagnosis not present

## 2017-05-18 DIAGNOSIS — J690 Pneumonitis due to inhalation of food and vomit: Secondary | ICD-10-CM | POA: Diagnosis not present

## 2017-05-18 DIAGNOSIS — F329 Major depressive disorder, single episode, unspecified: Secondary | ICD-10-CM | POA: Diagnosis not present

## 2017-05-18 DIAGNOSIS — N201 Calculus of ureter: Secondary | ICD-10-CM | POA: Diagnosis not present

## 2017-05-18 DIAGNOSIS — E785 Hyperlipidemia, unspecified: Secondary | ICD-10-CM | POA: Diagnosis not present

## 2017-05-18 DIAGNOSIS — Z880 Allergy status to penicillin: Secondary | ICD-10-CM | POA: Diagnosis not present

## 2017-05-18 DIAGNOSIS — R6520 Severe sepsis without septic shock: Secondary | ICD-10-CM | POA: Diagnosis not present

## 2017-05-18 DIAGNOSIS — Z8546 Personal history of malignant neoplasm of prostate: Secondary | ICD-10-CM | POA: Diagnosis not present

## 2017-05-18 DIAGNOSIS — R569 Unspecified convulsions: Secondary | ICD-10-CM | POA: Diagnosis not present

## 2017-05-18 DIAGNOSIS — G4733 Obstructive sleep apnea (adult) (pediatric): Secondary | ICD-10-CM | POA: Diagnosis not present

## 2017-05-18 DIAGNOSIS — R2689 Other abnormalities of gait and mobility: Secondary | ICD-10-CM | POA: Diagnosis not present

## 2017-05-18 DIAGNOSIS — I482 Chronic atrial fibrillation: Secondary | ICD-10-CM | POA: Diagnosis not present

## 2017-05-18 DIAGNOSIS — Z87442 Personal history of urinary calculi: Secondary | ICD-10-CM | POA: Diagnosis not present

## 2017-05-18 DIAGNOSIS — N12 Tubulo-interstitial nephritis, not specified as acute or chronic: Secondary | ICD-10-CM | POA: Diagnosis not present

## 2017-05-18 DIAGNOSIS — K219 Gastro-esophageal reflux disease without esophagitis: Secondary | ICD-10-CM | POA: Diagnosis not present

## 2017-05-18 DIAGNOSIS — I2510 Atherosclerotic heart disease of native coronary artery without angina pectoris: Secondary | ICD-10-CM | POA: Diagnosis not present

## 2017-05-18 DIAGNOSIS — I110 Hypertensive heart disease with heart failure: Secondary | ICD-10-CM | POA: Diagnosis not present

## 2017-05-18 LAB — BASIC METABOLIC PANEL
ANION GAP: 9 (ref 5–15)
BUN: 17 mg/dL (ref 6–20)
CALCIUM: 9 mg/dL (ref 8.9–10.3)
CO2: 27 mmol/L (ref 22–32)
CREATININE: 0.87 mg/dL (ref 0.61–1.24)
Chloride: 108 mmol/L (ref 101–111)
GLUCOSE: 111 mg/dL — AB (ref 65–99)
Potassium: 3.5 mmol/L (ref 3.5–5.1)
Sodium: 144 mmol/L (ref 135–145)

## 2017-05-18 LAB — CULTURE, BLOOD (ROUTINE X 2)
CULTURE: NO GROWTH
Culture: NO GROWTH
Special Requests: ADEQUATE
Special Requests: ADEQUATE

## 2017-05-18 MED ORDER — FUROSEMIDE 40 MG PO TABS: 20.0000 mg | ORAL_TABLET | Freq: Every day | ORAL | 11 refills | Status: DC

## 2017-05-18 MED ORDER — AMOXICILLIN-POT CLAVULANATE 875-125 MG PO TABS: 1.0000 | ORAL_TABLET | Freq: Two times a day (BID) | ORAL | 0 refills | Status: DC

## 2017-05-18 MED ORDER — METOPROLOL TARTRATE 25 MG PO TABS: 12.5000 mg | ORAL_TABLET | Freq: Two times a day (BID) | ORAL | 0 refills | Status: DC

## 2017-05-18 NOTE — Discharge Summary (Addendum)
Physician Discharge Summary  Zachary Warren QVZ:[id number] DOB: 1933-08-16 DOA: 05/13/2017  PCP: Wenda Low, MD  Admit date: 05/13/2017 Discharge date: 05/18/2017  Admitted From; Home  Disposition: SNF  Recommendations for Outpatient Follow-up:  1. Follow up with PCP in 1-2 weeks 2. Please obtain BMP/CBC in one week 3. Needs to follow up with urology in 2 -3 weeks post ureter sten placement   Discharge Condition: stable.  CODE STATUS: Full code.  Diet recommendation: Heart Healthy  Brief/Interim Summary: Zachary Warren a 83 y.o.malewith medical history significant of vascular dementia and remote cerebrovascular accident, presents with 3 days of generalized malaise, fevers, chills. Symptoms have been persistent, no improving or worsening factors, associated with confusion and worsening disorientation. This morning the patient had nausea and vomiting. Patient's wife decided to bring him to the hospital for further evaluation. The patient's history is limited due to his dementia, most information obtained from his family members at bedside.No history of nephrolithiasis in the past.  Apparently patient had a severe choking event while eating approximately 3 days ago, no worsening cough or phlegm production.    ED Course:Patient found febrile with septic features, workup with CT abdomen positive for ureteral stone on the left side, possible left lower lobe pneumonia, started on antibiotic therapy, contacted Urology. Referred for admission at [facility] for further Urology evaluation and sepsis management.    Assessment & Plan:   Active Problems:   Sepsis ([place])   Pyelonephritis  1-Sepsis; secondary to UTI, pyelo., PNA;  Treated with  IV fluids, IV antibiotics.  Treated with IV Levaquin during admission, subsequently change to Augmentin to avoid worsening delirium.  WBC trending down.  Blood culture; final no growth to date.  urine culture; 10,000 insignificant growth.   2-Left  pyelonephritis, obstructive uropathy;  CT; left ureter at the upper acetabulum level measuring 1.0 x 0.8 x 0.6 cm with moderately severe hydronephrosis on the left. There are small nonobstructing calculi in each kidney. S/P Left ureteral stent placement He will need 10 Tx of antibiotics for pyelo. Will prescribe 5 more days of Augmentin.  Needs to follow up with Dr Tresa Moore in 2-3 weeks for further care.   3-Aspiration PNA; had shocking episode on food prior to admission.  Treated with IV Levaquin. Transition to oral Augmentin.   4-Chronic A fib;  Continue with home dose diltiazem.  Discussed with Dr Tresa Moore, ok to resume eliquis.  starte low dose metoprolol to help with rate controlled.   5-Metabolic encephalopathy; secondary to infection.  IV fluids. Improving.   6-Hypokalemia; resolved. Received K-Cl supplement during this admission.     Discharge Diagnoses:  Active Problems:   Sepsis [facility])   Pyelonephritis    Discharge Instructions  Discharge Instructions    Diet - low sodium heart healthy    Complete by:  As directed    Increase activity slowly    Complete by:  As directed      Allergies as of 05/18/2017      Reactions   Aricept [donepezil Hcl] Other (See Comments)   "stomach pain", generic   Ceclor [cefaclor]    Namenda [memantine Hcl] Other (See Comments)   "dizzy", brand medication   Ramipril Other (See Comments)    cough      Medication List    TAKE these medications   amoxicillin-clavulanate 875-125 MG tablet Commonly known as:  AUGMENTIN Take 1 tablet by mouth every 12 (twelve) hours.   atorvastatin 40 MG tablet Commonly known as:  LIPITOR TAKE  1 TABLET BY MOUTH EVERY DAY - PATIENT DUE FOR LIPID PANEL   diltiazem 180 MG 24 hr capsule Commonly known as:  CARDIZEM CD TAKE 1 CAPSULE BY MOUTH TWICE DAILY   ELIQUIS 5 MG Tabs tablet Generic drug:  apixaban Take 1 tablet by mouth 2 (two) times daily.   FLUoxetine 40 MG capsule Commonly known as:   PROZAC Take 40 mg by mouth daily.   furosemide 40 MG tablet Commonly known as:  LASIX Take 0.5 tablets (20 mg total) by mouth daily. What changed:  how much to take   guaiFENesin 600 MG 12 hr tablet Commonly known as:  MUCINEX Take 600 mg by mouth 2 (two) times daily.   metoprolol tartrate 25 MG tablet Commonly known as:  LOPRESSOR Take 0.5 tablets (12.5 mg total) by mouth 2 (two) times daily.   multivitamin with minerals tablet Take 1 tablet by mouth daily. Chewable flinstones   omeprazole 20 MG tablet Commonly known as:  PRILOSEC OTC Take 20 mg by mouth 2 (two) times daily.   SYMBICORT IN Inhale 2 puffs into the lungs daily.   topiramate 50 MG tablet Commonly known as:  TOPAMAX Take 1 tablet (50 mg total) by mouth 2 (two) times daily.       Contact information for follow-up providers    Alexis Frock, MD Follow up in 2 week(s).   Specialty:  Urology Contact information: [address] [place] [address] [phone]            Contact information for after-discharge care    [place] [facility] Follow up.   Specialty:  [place] information: [address] [place] [phone]                 Allergies  Allergen Reactions  . Aricept [Donepezil Hcl] Other (See Comments)    "stomach pain", generic  . Ceclor [Cefaclor]   . Namenda [Memantine Hcl] Other (See Comments)    "dizzy", brand medication  . Ramipril Other (See Comments)     cough    Consultations: Dr Tresa Moore   Procedures/Studies: Ct Abdomen Pelvis Wo Contrast  Addendum Date: 05/13/2017   ADDENDUM REPORT: 05/13/2017 11:41 ADDENDUM: Aortic aneurysm NOS (ICD10-I71.9). Electronically Signed   By: Lowella Grip III M.D.   On: 05/13/2017 11:41   Result Date: 05/13/2017 CLINICAL DATA:  Loss of appetite. Weakness. Prostate carcinoma. Abdominal pain. EXAM: CT ABDOMEN AND PELVIS WITHOUT CONTRAST  TECHNIQUE: Multidetector CT imaging of the abdomen and pelvis was performed following the standard protocol without and contrast material administration. COMPARISON:  None. FINDINGS: Lower chest: There is bibasilar lung scarring. There is patchy airspace opacity in the left lower lobe consistent with pneumonia. Pacemaker leads are attached to the right atrium and right ventricle. There is a small hiatal hernia. Hepatobiliary: No focal liver lesions are evident on this noncontrast enhanced study. There are foci of calcification and peripheral hepatic artery vessels anteriorly. There are multiple gallstones in the gallbladder. Gallbladder wall does not appear appreciably thickened. There is no biliary duct dilatation. Pancreas: No pancreatic mass or inflammatory focus. Spleen: No splenic lesions are evident. Adrenals/Urinary Tract: Adrenals appear unremarkable bilaterally. There is no appreciable renal mass on either side. There is moderately severe hydronephrosis on the left. No hydronephrosis on the right. There is a 1 mm calculus in the lower pole the right kidney, nonobstructing. There are several 1 mm calculi in the lower pole left  kidney, nonobstructing. There is a calculus in the left ureter at the upper acetabular level measuring 1.0 x 0.8 x 0.6 cm. No other ureteral calculi are evident. Urinary bladder is midline. Urinary bladder wall is borderline thickened. Stomach/Bowel: There is no appreciable bowel wall or mesenteric thickening. There is moderate stool in the colon. No bowel obstruction. No free air or portal venous air. No bowel pneumatosis. Vascular/Lymphatic: There is atherosclerotic calcification throughout the aorta and iliac arterial vessels as well as extending into both common femoral and superficial femoral arteries. Aneurysm dilatation of the mid the distal abdominal aorta is noted with a maximum transverse diameter of 3.3 x 3.2 cm. No periaortic fluid. There is extensive calcification in the  major mesenteric vessels. Calcification is marked in the proximal right renal artery. Both common and external iliac arteries are tortuous. There is no appreciable adenopathy in the abdomen or pelvis. Reproductive: There are seed implants in the prostate. The prostatic size and contour normal. Seminal vesicles appear normal. Note that the right testis is located in the inferior right inguinal ring. Other: No periappendiceal region inflammation. No appreciable abscess or ascites is evident in the abdomen or pelvis. There is a rather minimal ventral hernia containing only fat. Musculoskeletal: There is extensive degenerative change in the lumbar spine. There are no blastic or lytic bone lesions. No intramuscular or abdominal wall lesion evident. IMPRESSION: 1. There is a calculus in the left ureter at the upper acetabulum level measuring 1.0 x 0.8 x 0.6 cm with moderately severe hydronephrosis on the left. There are small nonobstructing calculi in each kidney. 2. Slight urinary bladder wall thickening, potentially due to inflammation secondary to seed implants in the prostate adjacent to the bladder. 3. Airspace consolidation left lower lobe consistent with pneumonia. 4. Cholelithiasis. Gallbladder wall does not appear appreciably thickened on this study. 5. Mid and distal abdominal aortic aneurysm with maximum transverse diameter of 3.3 x 3.2 cm. Recommend followup by ultrasound in 3 years. This recommendation follows ACR consensus guidelines: White Paper of the ACR Incidental Findings Committee II on Vascular Findings. J Am Coll Radiol 2013; 10:789-794. 6. Extensive atherosclerotic calcification throughout the aorta and major pelvic arterial vessels as well as major mesenteric vessels. Marked atherosclerotic calcification in the proximal right renal artery. In this regard, question whether patient is hypertensive. 7. Right testis in distal inguinal ring. No complicating features in this area. 8. Small hiatal hernia.  Minimal ventral hernia containing only fat. Aortic Atherosclerosis (ICD10-I70.0). Electronically Signed: By: Lowella Grip III M.D. On: 05/13/2017 11:35   Dg Chest 2 View  Result Date: 05/13/2017 CLINICAL DATA:  Weakness and cough.  Nausea vomiting EXAM: CHEST  2 VIEW COMPARISON:  06/16/2016 FINDINGS: Mild cardiac enlargement. Dual lead pacemaker unchanged. CABG. Negative for heart failure. Left lower lobe airspace disease has progressed since the study of 06/16/2016. There appears to have been pneumonia in this area on earlier studies. This could represent recurrent pneumonia or progressive scarring. Negative for pleural effusion. Underlying COPD. IMPRESSION: COPD Left lower lobe airspace disease may represent recurrent pneumonia Electronically Signed   By: Franchot Gallo M.D.   On: 05/13/2017 10:50   Ct Head Wo Contrast  Result Date: 05/13/2017 CLINICAL DATA:  Weakness, confusion. EXAM: CT HEAD WITHOUT CONTRAST TECHNIQUE: Contiguous axial images were obtained from the base of the skull through the vertex without intravenous contrast. COMPARISON:  CT scan of October 10, 2014. FINDINGS: Brain: Mild diffuse cortical atrophy is noted. Stable left frontal encephalomalacia is noted consistent with  old infarction. No mass effect or midline shift is noted. Ventricular size is within normal limits. There is no evidence of mass lesion, hemorrhage or acute infarction. Vascular: No hyperdense vessel or unexpected calcification. Skull: Normal. Negative for fracture or focal lesion. Sinuses/Orbits: No acute finding. Other: None. IMPRESSION: Mild diffuse cortical atrophy. Stable old left frontal infarction. No acute intracranial abnormality seen. Electronically Signed   By: Marijo Conception, M.D.   On: 05/13/2017 11:29   US Venous Img Lower Unilateral Right  Result Date: 05/13/2017 CLINICAL DATA:  Right lower extremity edema. EXAM: RIGHT LOWER EXTREMITY VENOUS DOPPLER ULTRASOUND TECHNIQUE: Gray-scale sonography with  graded compression, as well as color Doppler and duplex ultrasound were performed to evaluate the lower extremity deep venous systems from the level of the common femoral vein and including the common femoral, femoral, profunda femoral, popliteal and calf veins including the posterior tibial, peroneal and gastrocnemius veins when visible. The superficial great saphenous vein was also interrogated. Spectral Doppler was utilized to evaluate flow at rest and with distal augmentation maneuvers in the common femoral, femoral and popliteal veins. COMPARISON:  None. FINDINGS: Contralateral Common Femoral Vein: Respiratory phasicity is normal and symmetric with the symptomatic side. No evidence of thrombus. Normal compressibility. Common Femoral Vein: No evidence of thrombus. Normal compressibility, respiratory phasicity and response to augmentation. Saphenofemoral Junction: No evidence of thrombus. Normal compressibility and flow on color Doppler imaging. Profunda Femoral Vein: No evidence of thrombus. Normal compressibility and flow on color Doppler imaging. Femoral Vein: No evidence of thrombus. Normal compressibility, respiratory phasicity and response to augmentation. Popliteal Vein: No evidence of thrombus. Normal compressibility, respiratory phasicity and response to augmentation. Calf Veins: No evidence of thrombus. Normal compressibility and flow on color Doppler imaging. Other Findings:  None. IMPRESSION: No evidence of DVT within the right lower extremity. Electronically Signed   By: Marcello Moores  Register   On: 05/13/2017 10:37   Dg C-arm 1-60 Min-no Report  Result Date: 05/13/2017 Fluoroscopy was utilized by the requesting physician.  No radiographic interpretation.     Subjective: pleasantly confuse.  Denies chest pain or dyspnea.   Discharge Exam: Vitals:   05/18/17 0705 05/18/17 0921  BP: 116/72   Pulse: 72   Resp: 17 18  Temp: 97.7 F (36.5 C)    Vitals:   05/18/17 0331 05/18/17 0705 05/18/17  0905 05/18/17 0921  BP: 116/70 116/72    Pulse: 81 72    Resp: 18 17  18   Temp: 98.4 F (36.9 C) 97.7 F (36.5 C)    TempSrc: Oral Oral    SpO2: 96% (!) 87% 96% 96%  Weight:      Height:        General: Pt is alert, awake, not in acute distress Cardiovascular: RRR, S1/S2 +, no rubs, no gallops Respiratory: CTA bilaterally, no wheezing, no rhonchi Abdominal: Soft, NT, ND, bowel sounds + Extremities: no edema, no cyanosis    The results of significant diagnostics from this hospitalization (including imaging, microbiology, ancillary and laboratory) are listed below for reference.     Microbiology: Recent Results (from the past 240 hour(s))  Urine culture     Status: Abnormal   Collection Time: 05/13/17 10:03 AM  Result Value Ref Range Status   Specimen Description URINE, CLEAN CATCH  Final   Special Requests NONE  Final   Culture (A)  Final    <10,000 COLONIES/mL INSIGNIFICANT GROWTH Performed at [facility], [address], [place], [place] [address]  Report Status 05/14/2017 FINAL  Final  Culture, blood (routine x 2)     Status: None   Collection Time: 05/13/17 12:25 PM  Result Value Ref Range Status   Specimen Description LEFT ANTECUBITAL  Final   Special Requests   Final    BOTTLES DRAWN AEROBIC AND ANAEROBIC Blood Culture adequate volume   Culture NO GROWTH 5 DAYS  Final   Report Status 05/18/2017 FINAL  Final  Culture, blood (routine x 2)     Status: None   Collection Time: 05/13/17 12:33 PM  Result Value Ref Range Status   Specimen Description BLOOD RIGHT HAND  Final   Special Requests   Final    BOTTLES DRAWN AEROBIC AND ANAEROBIC Blood Culture adequate volume   Culture NO GROWTH 5 DAYS  Final   Report Status 05/18/2017 FINAL  Final  MRSA PCR Screening     Status: None   Collection Time: 05/13/17  5:00 PM  Result Value Ref Range Status   MRSA by PCR NEGATIVE NEGATIVE Final    Comment:        The GeneXpert MRSA Assay (FDA approved for NASAL  specimens only), is one component of a comprehensive MRSA colonization surveillance program. It is not intended to diagnose MRSA infection nor to guide or monitor treatment for MRSA infections.      Labs: BNP (last 3 results)  Recent Labs  05/13/17 0940  BNP 163.8*   Basic Metabolic Panel:  Recent Labs Lab 05/14/17 0305 05/15/17 0347 05/16/17 0457 05/17/17 0812 05/18/17 0500  NA 144 145 140 143 144  K 2.9* 3.1* 2.9* 3.3* 3.5  CL 110 112* 105 109 108  CO2 27 27 26 28 27   GLUCOSE 135* 135* 111* 117* 111*  BUN 26* 23* 19 14 17   CREATININE 1.47* 1.25* 0.94 0.84 0.87  CALCIUM 8.4* 8.5* 8.3* 8.9 9.0   Liver Function Tests:  Recent Labs Lab 05/13/17 0940 05/14/17 0305  AST 18 15  ALT 14* 11*  ALKPHOS 77 72  BILITOT 0.8 0.8  PROT 6.9 6.3*  ALBUMIN 3.4* 3.0*    Recent Labs Lab 05/13/17 0940  LIPASE 22   No results for input(s): AMMONIA in the last 168 hours. CBC:  Recent Labs Lab 05/13/17 0940 05/13/17 1739 05/14/17 0816 05/15/17 0347 05/16/17 0457  WBC 18.0* 16.5* 12.4* 10.8* 10.1  NEUTROABS 15.8*  --   --   --   --   HGB 13.2 12.6* 12.4* 11.7* 11.6*  HCT 41.8 39.5 38.6* 37.6* 36.3*  MCV 95.7 92.3 93.9 95.9 91.9  PLT 188 180 163 167 175   Cardiac Enzymes:  Recent Labs Lab 05/13/17 0940  TROPONINI 0.03*   BNP: Invalid input(s): POCBNP CBG: No results for input(s): GLUCAP in the last 168 hours. D-Dimer No results for input(s): DDIMER in the last 72 hours. Hgb A1c No results for input(s): HGBA1C in the last 72 hours. Lipid Profile No results for input(s): CHOL, HDL, LDLCALC, TRIG, CHOLHDL, LDLDIRECT in the last 72 hours. Thyroid function studies No results for input(s): TSH, T4TOTAL, T3FREE, THYROIDAB in the last 72 hours.  Invalid input(s): FREET3 Anemia work up No results for input(s): VITAMINB12, FOLATE, FERRITIN, TIBC, IRON, RETICCTPCT in the last 72 hours. Urinalysis    Component Value Date/Time   COLORURINE YELLOW  05/13/2017 1003   APPEARANCEUR HAZY (A) 05/13/2017 1003   LABSPEC 1.014 05/13/2017 1003   PHURINE 5.0 05/13/2017 1003   GLUCOSEU NEGATIVE 05/13/2017 1003   HGBUR MODERATE (A) 05/13/2017 1003  BILIRUBINUR NEGATIVE 05/13/2017 1003   KETONESUR NEGATIVE 05/13/2017 1003   PROTEINUR NEGATIVE 05/13/2017 1003   UROBILINOGEN 0.2 10/23/2014 2359   NITRITE NEGATIVE 05/13/2017 1003   LEUKOCYTESUR NEGATIVE 05/13/2017 1003   Sepsis Labs Invalid input(s): PROCALCITONIN,  WBC,  LACTICIDVEN Microbiology Recent Results (from the past 240 hour(s))  Urine culture     Status: Abnormal   Collection Time: 05/13/17 10:03 AM  Result Value Ref Range Status   Specimen Description URINE, CLEAN CATCH  Final   Special Requests NONE  Final   Culture (A)  Final    <10,000 COLONIES/mL INSIGNIFICANT GROWTH Performed at [facility], [address], [place], [place] [address]    Report Status 05/14/2017 FINAL  Final  Culture, blood (routine x 2)     Status: None   Collection Time: 05/13/17 12:25 PM  Result Value Ref Range Status   Specimen Description LEFT ANTECUBITAL  Final   Special Requests   Final    BOTTLES DRAWN AEROBIC AND ANAEROBIC Blood Culture adequate volume   Culture NO GROWTH 5 DAYS  Final   Report Status 05/18/2017 FINAL  Final  Culture, blood (routine x 2)     Status: None   Collection Time: 05/13/17 12:33 PM  Result Value Ref Range Status   Specimen Description BLOOD RIGHT HAND  Final   Special Requests   Final    BOTTLES DRAWN AEROBIC AND ANAEROBIC Blood Culture adequate volume   Culture NO GROWTH 5 DAYS  Final   Report Status 05/18/2017 FINAL  Final  MRSA PCR Screening     Status: None   Collection Time: 05/13/17  5:00 PM  Result Value Ref Range Status   MRSA by PCR NEGATIVE NEGATIVE Final    Comment:        The GeneXpert MRSA Assay (FDA approved for NASAL specimens only), is one component of a comprehensive MRSA colonization surveillance program. It is not intended to  diagnose MRSA infection nor to guide or monitor treatment for MRSA infections.      Time coordinating discharge: Over 30 minutes  SIGNED:   Elmarie Shiley, MD  [facility] 05/18/2017, 11:32 AM Pager [phone]  If 7PM-7AM, please contact night-coverage [email or web address] Password [email or web address]

## 2017-05-18 NOTE — Progress Notes (Signed)
Report given to Raven, RN at [facility]. Patient is ready for discharge at this time.

## 2017-05-18 NOTE — Care Management Note (Signed)
Case Management Note  Patient Details  Name: CHAYANNE SPEIR MRN: [id number] Date of Birth: Oct 03, 1933  Subjective/Objective:                    Action/Plan:d/c SNF.   Expected Discharge Date:  05/18/17               Expected Discharge Plan:  Skilled Nursing Facility  In-House Referral:  Clinical Social Work  Discharge planning Services  CM Consult  Post Acute Care Choice:    Choice offered to:     DME Arranged:    DME Agency:     HH Arranged:    [place] Agency:     Status of Service:  Completed, signed off  If discussed at [employer], dates discussed:    Additional Comments:  Dessa Phi, RN 05/18/2017, 11:40 AM

## 2017-05-18 NOTE — Clinical Social Work Placement (Signed)
Patient received and accepted bed offer at [facility] SNF. SNF reported that patient can arrive around 3:00pm. [facility] contacted, patient's family notified. Patient's RN aware. Patient's RN can call report to [phone], patient going to [facility].   CLINICAL SOCIAL WORK PLACEMENT  NOTE  Date:  05/18/2017  Patient Details  Name: Zachary Warren MRN: [id number] Date of Birth: [birth year]  Clinical Social Work is seeking post-discharge placement for this patient at the [place] level of care (*CSW will initial, date and re-position this form in  chart as items are completed):  Yes   Patient/family provided with [place] Work Department's list of facilities offering this level of care within the geographic area requested by the patient (or if unable, by the patient's family).  Yes   Patient/family informed of their freedom to choose among providers that offer the needed level of care, that participate in Medicare, Medicaid or managed care program needed by the patient, have an available bed and are willing to accept the patient.  Yes   Patient/family informed of [facility]'s ownership interest in [facility] and [facility], as well as of the fact that they are under no obligation to receive care at these facilities.  PASRR submitted to EDS on 05/17/17     PASRR number received on 05/17/17     Existing PASRR number confirmed on       FL2 transmitted to all facilities in geographic area requested by pt/family on 05/17/17     FL2 transmitted to all facilities within larger geographic area on       Patient informed that his/her managed care company has contracts with or will negotiate with certain facilities, including the following:        Yes   Patient/family informed of bed offers received.  Patient chooses bed at [facility]     Physician recommends and patient chooses bed at      Patient to be transferred to  [facility] on 05/18/17.  Patient to be transferred to facility by PT[facility]     Patient family notified on 05/18/17 of transfer.  Name of family member notified:  [name]     PHYSICIAN       Additional Comment:    _______________________________________________ Burnis Medin, LCSW 05/18/2017, 12:10 PM

## 2017-05-25 ENCOUNTER — Encounter: Payer: Medicare Other | Admitting: *Deleted

## 2017-05-28 ENCOUNTER — Ambulatory Visit: Payer: Self-pay

## 2017-06-01 ENCOUNTER — Other Ambulatory Visit: Payer: Self-pay | Admitting: Cardiology

## 2017-06-01 DIAGNOSIS — N202 Calculus of kidney with calculus of ureter: Secondary | ICD-10-CM | POA: Diagnosis not present

## 2017-06-01 DIAGNOSIS — Z8546 Personal history of malignant neoplasm of prostate: Secondary | ICD-10-CM | POA: Diagnosis not present

## 2017-06-02 ENCOUNTER — Other Ambulatory Visit: Payer: Self-pay | Admitting: Urology

## 2017-06-07 ENCOUNTER — Encounter (HOSPITAL_COMMUNITY): Payer: Self-pay | Admitting: *Deleted

## 2017-06-07 NOTE — Progress Notes (Signed)
REfaxed updated instruction with clear liquid diet instructions attached.  To [facility].

## 2017-06-07 NOTE — Progress Notes (Signed)
Rerequesgted current [facility], medical history, progress notes and any recent labs be faxed to [phone].  Spoke with nurse.   Faxed to [place] for periop orders to be completed for pacemaker.

## 2017-06-07 NOTE — Progress Notes (Addendum)
  CLEAR LIQUID DIET   Foods Allowed                                                                     Foods Excluded  Coffee and tea, regular and decaf                             liquids that you cannot  Plain Jell-O in any flavor                                             see through such as: Fruit ices (not with fruit pulp)                                     milk, soups, orange juice  Iced Popsicles                                    All solid food Carbonated beverages, regular and diet                                    Cranberry, grape and apple juices Sports drinks like Gatorade Lightly seasoned clear broth or consume(fat free) Sugar, honey syrup  Sample Menu Breakfast                                Lunch                                     Supper Cranberry juice                    Beef broth                            Chicken broth Jell-O                                     Grape juice                           Apple juice Coffee or tea                        Jell-O                                      Popsicle                                                  Coffee or tea                        Coffee or tea  _____________________________________________________________________  Preop instructions for:   Zachary Warren                       Date of Birth    16-Nov-1932                        Date of Procedure:    06/09/2017     Doctor: Dr Alexis Frock  [facility] to arrive at [facility]:  1230pm Report to: Admitting  Procedure: cystoscopy with retrograde pyelogram, left stent replacement, ureteroscopy, Holmium, laser application  Any procedure time changes, MD office will notify you!     No solid food after midnite the nite before procedure.  May have clear liquids from 12 midnite until 0900am morning of procedure then nothing by mouth.   Reminder:Follow bowel prep instructions per MD office!   Take these morning medications only with sips of  water.(or give through gastrostomy or feeding tube). Metoprolol, diltiazem ( Cardizem), Fluoxetine ( Prozac), Topiramate ( Topamax). Use Symbicort Inhaler and send with patient, Omeprazole ( Prilosec)   Note: No Insulin or Diabetic meds should be given or taken the morning of the procedure!   Facility contact:    [facility]               Phone:    [place]:  Transportation contact phone#:  Please send day of procedure:current med list and meds last taken that day, confirm nothing by mouth status from what time, Patient Demographic info( to include DNR status, problem list, allergies)   RN contact name/phone#:    Gillian Shields RN                         and Fax [phone] and Phone # [phone]   Bring Insurance card and picture ID Leave all jewelry and other valuables at place where living( no metal or rings to be worn) No contact lens Women-no make-up, no lotions,perfumes,powders Men-no colognes,lotions  Any questions day of procedure [phone]    Sent from :[facility]                   [place]                   [phone]  Sent by : Gillian Shields RN

## 2017-06-08 MED ORDER — GENTAMICIN SULFATE 40 MG/ML IJ SOLN
400.0000 mg | INTRAVENOUS | Status: AC
  Administered 2017-06-09: 400 mg via INTRAVENOUS
  Filled 2017-06-08: qty 10

## 2017-06-08 NOTE — Discharge Instructions (Signed)
1 - You may have urinary urgency (bladder spasms) and bloody urine on / off with stent in place. This is normal. ° °2 - Call MD or go to ER for fever >102, severe pain / nausea / vomiting not relieved by medications, or acute change in medical status ° °

## 2017-06-08 NOTE — H&P (Signed)
Zachary Warren is an 83 y.o. male.    Chief Complaint: Pre-op LEFT Ureteroscopic Stone Manipulation  HPI:   1 - Left Ureteral Stone - 1cm left mid ureteral stone by ER CT on eval abdominal pain and fevers. Stone is solitary, 950HU. Minimal perinephric stranding. He is on topamax. Temporized with Left JJ stent (6x26 contour) 05/14/17 in face of possible urosepsis. Final UCX from that time scant growth / non-clonal.   2 - Prostate Cancer - s/p EBRT years ago for adenocarcinoma. Had 10 years of PSA surveillance.   PMH sig for CAD/CABG/Pacer/CHF, CVA with aphasia (well adapted, lives nursing home in [place]),   Today "Zachary Warren" is seen to proceed with LEFT ureteroscopic stone manipulation. NO interval fevers. Most recent UCX negative.    Past Medical History:  Diagnosis Date  . COPD (chronic obstructive pulmonary disease) ([place])   . Coronary artery disease    s/p 5v CABG 2002  . CVA (cerebral infarction) 1999  . DDD (degenerative disc disease), cervical   . Dementia   . Depression   . GERD (gastroesophageal reflux disease)   . History of kidney stones   . Hyperlipidemia   . Hypertension   . Memory impairment   . OSA (obstructive sleep apnea)    Recently diagnosed though pt. has not had CPAP trial  . Pacemaker   . Persistent atrial fibrillation ([place])    on pradaxa  . Pneumonia    recent hospital admission for pneumonia   . Prostate cancer ([place]) 2006   s/p XRT  . PVD (peripheral vascular disease) ([place])   . Seizures ([place])   . Tachycardia-bradycardia [facility])    s/p PPM by [facility] 6/12    Past Surgical History:  Procedure Laterality Date  . CARDIOVERSION N/A 08/21/2013   Procedure: CARDIOVERSION;  Surgeon: Candee Furbish, MD;  Location: [facility];  Service: Cardiovascular;  Laterality: N/A;  . [place]  . CORONARY ARTERY BYPASS GRAFT  2002  . CYSTOSCOPY W/ URETERAL STENT PLACEMENT Left 05/13/2017   Procedure: CYSTOSCOPY WITH RETROGRADE PYELOGRAM/URETERAL STENT PLACEMENT LEFT;  Surgeon:  Alexis Frock, MD;  Location: [facility];  Service: Urology;  Laterality: Left;  . INSERT / REPLACE / REMOVE PACEMAKER    . NECK SURGERY     hx of   . PACEMAKER INSERTION     implanted by [facility] 6/12    Family History  Problem Relation Age of Onset  . Cancer Mother        ovarian  . Ovarian cancer Mother   . Cancer Father        pancreatic  . Pancreatic cancer Father   . Cancer Brother        lung  . Ovarian cancer Unknown   . Pancreatic cancer Unknown   . Lung cancer Unknown    Social History:  reports that he quit smoking about 19 years ago. His smoking use included Cigarettes. He has a 96.00 pack-year smoking history. He has never used smokeless tobacco. He reports that he does not drink alcohol or use drugs.  Allergies:  Allergies  Allergen Reactions  . Aricept [Donepezil Hcl] Other (See Comments)    "stomach pain", generic  . Ceclor [Cefaclor]   . Namenda [Memantine Hcl] Other (See Comments)    "dizzy", brand medication  . Penicillins     Documented on MAR as "PCN"  . Ramipril Other (See Comments)     cough    No prescriptions prior to admission.    No results  found for this or any previous visit (from the past 48 hour(s)). No results found.  Review of Systems  Constitutional: Negative.  Negative for chills and fever.  HENT: Negative.   Eyes: Negative.   Respiratory: Negative.   Cardiovascular: Negative.   Gastrointestinal: Negative.  Negative for nausea and vomiting.  Genitourinary: Negative.   Musculoskeletal: Negative.   Skin: Negative.   Neurological: Positive for sensory change and speech change.  Endo/Heme/Allergies: Negative.   Psychiatric/Behavioral: Negative.     There were no vitals taken for this visit. Physical Exam  Constitutional:  Frail, elderly with stigmata of neurologic disease.   HENT:  Head: Normocephalic.  Eyes: Pupils are equal, round, and reactive to light.  Neck: Normal range of motion.  Cardiovascular: Normal rate.    Respiratory: Effort normal.  GI: Soft.  Genitourinary:  Genitourinary Comments: NO CVAT at present.   Musculoskeletal: Normal range of motion.  Neurological:  Aphasic.   Skin: Skin is warm.     Assessment/Plan  Proceed as planned with LEFT ureteroscopoic stone manipulation. Risks, benefits, alternatives, expected peri-op course discussed previously with pt and family and reiterated today.   Alexis Frock, MD 06/08/2017, 10:56 PM

## 2017-06-09 ENCOUNTER — Ambulatory Visit (HOSPITAL_COMMUNITY): Payer: Medicare Other | Admitting: Anesthesiology

## 2017-06-09 ENCOUNTER — Encounter (HOSPITAL_COMMUNITY)
Admission: RE | Disposition: A | Discharge status: Skilled Nursing Facility | Payer: Self-pay | Source: Ambulatory Visit | Attending: Urology

## 2017-06-09 ENCOUNTER — Ambulatory Visit (HOSPITAL_COMMUNITY): Payer: Medicare Other

## 2017-06-09 ENCOUNTER — Encounter (HOSPITAL_COMMUNITY): Payer: Self-pay | Admitting: *Deleted

## 2017-06-09 ENCOUNTER — Ambulatory Visit (HOSPITAL_COMMUNITY)
Admission: RE | Admit: 2017-06-09 | Discharge: 2017-06-09 | Disposition: A | Discharge status: Skilled Nursing Facility | Payer: Medicare Other | Source: Ambulatory Visit | Attending: Urology | Admitting: Urology

## 2017-06-09 DIAGNOSIS — F329 Major depressive disorder, single episode, unspecified: Secondary | ICD-10-CM | POA: Insufficient documentation

## 2017-06-09 DIAGNOSIS — G4730 Sleep apnea, unspecified: Secondary | ICD-10-CM | POA: Diagnosis not present

## 2017-06-09 DIAGNOSIS — I509 Heart failure, unspecified: Secondary | ICD-10-CM | POA: Insufficient documentation

## 2017-06-09 DIAGNOSIS — I69920 Aphasia following unspecified cerebrovascular disease: Secondary | ICD-10-CM | POA: Diagnosis not present

## 2017-06-09 DIAGNOSIS — G4733 Obstructive sleep apnea (adult) (pediatric): Secondary | ICD-10-CM | POA: Diagnosis not present

## 2017-06-09 DIAGNOSIS — Z87891 Personal history of nicotine dependence: Secondary | ICD-10-CM | POA: Insufficient documentation

## 2017-06-09 DIAGNOSIS — E785 Hyperlipidemia, unspecified: Secondary | ICD-10-CM | POA: Insufficient documentation

## 2017-06-09 DIAGNOSIS — I2510 Atherosclerotic heart disease of native coronary artery without angina pectoris: Secondary | ICD-10-CM | POA: Insufficient documentation

## 2017-06-09 DIAGNOSIS — K219 Gastro-esophageal reflux disease without esophagitis: Secondary | ICD-10-CM | POA: Diagnosis not present

## 2017-06-09 DIAGNOSIS — Z923 Personal history of irradiation: Secondary | ICD-10-CM | POA: Insufficient documentation

## 2017-06-09 DIAGNOSIS — I481 Persistent atrial fibrillation: Secondary | ICD-10-CM | POA: Insufficient documentation

## 2017-06-09 DIAGNOSIS — F0390 Unspecified dementia without behavioral disturbance: Secondary | ICD-10-CM | POA: Diagnosis not present

## 2017-06-09 DIAGNOSIS — Z8546 Personal history of malignant neoplasm of prostate: Secondary | ICD-10-CM | POA: Insufficient documentation

## 2017-06-09 DIAGNOSIS — J449 Chronic obstructive pulmonary disease, unspecified: Secondary | ICD-10-CM | POA: Diagnosis not present

## 2017-06-09 DIAGNOSIS — R569 Unspecified convulsions: Secondary | ICD-10-CM | POA: Insufficient documentation

## 2017-06-09 DIAGNOSIS — I739 Peripheral vascular disease, unspecified: Secondary | ICD-10-CM | POA: Insufficient documentation

## 2017-06-09 DIAGNOSIS — I110 Hypertensive heart disease with heart failure: Secondary | ICD-10-CM | POA: Insufficient documentation

## 2017-06-09 DIAGNOSIS — N201 Calculus of ureter: Secondary | ICD-10-CM | POA: Diagnosis not present

## 2017-06-09 DIAGNOSIS — Z951 Presence of aortocoronary bypass graft: Secondary | ICD-10-CM | POA: Insufficient documentation

## 2017-06-09 DIAGNOSIS — Z950 Presence of cardiac pacemaker: Secondary | ICD-10-CM | POA: Insufficient documentation

## 2017-06-09 DIAGNOSIS — I10 Essential (primary) hypertension: Secondary | ICD-10-CM | POA: Diagnosis not present

## 2017-06-09 DIAGNOSIS — Z87442 Personal history of urinary calculi: Secondary | ICD-10-CM | POA: Insufficient documentation

## 2017-06-09 HISTORY — DX: Pneumonia, unspecified organism: J18.9

## 2017-06-09 HISTORY — PX: HOLMIUM LASER APPLICATION: SHX5852

## 2017-06-09 HISTORY — DX: Unspecified dementia, unspecified severity, without behavioral disturbance, psychotic disturbance, mood disturbance, and anxiety: F03.90

## 2017-06-09 HISTORY — DX: Chronic obstructive pulmonary disease, unspecified: J44.9

## 2017-06-09 HISTORY — DX: Personal history of urinary calculi: Z87.442

## 2017-06-09 HISTORY — PX: CYSTOSCOPY WITH RETROGRADE PYELOGRAM, URETEROSCOPY AND STENT PLACEMENT: SHX5789

## 2017-06-09 SURGERY — CYSTOURETEROSCOPY, WITH RETROGRADE PYELOGRAM AND STENT INSERTION
Anesthesia: General | Site: Ureter | Laterality: Left

## 2017-06-09 MED ORDER — TRAMADOL HCL 50 MG PO TABS: 50.0000 mg | ORAL_TABLET | Freq: Four times a day (QID) | ORAL | 0 refills | Status: DC | PRN

## 2017-06-09 MED ORDER — LIDOCAINE HCL (CARDIAC) 20 MG/ML IV SOLN
INTRAVENOUS | Status: DC | PRN
  Administered 2017-06-09: 50 mg via INTRAVENOUS

## 2017-06-09 MED ORDER — DEXAMETHASONE SODIUM PHOSPHATE 10 MG/ML IJ SOLN
INTRAMUSCULAR | Status: DC | PRN
  Administered 2017-06-09: 10 mg via INTRAVENOUS

## 2017-06-09 MED ORDER — ONDANSETRON HCL 4 MG/2ML IJ SOLN
INTRAMUSCULAR | Status: DC | PRN
  Administered 2017-06-09: 4 mg via INTRAVENOUS

## 2017-06-09 MED ORDER — FENTANYL CITRATE (PF) 100 MCG/2ML IJ SOLN
INTRAMUSCULAR | Status: AC
  Filled 2017-06-09: qty 2

## 2017-06-09 MED ORDER — IOPAMIDOL (ISOVUE-300) INJECTION 61%
INTRAVENOUS | Status: DC | PRN
  Administered 2017-06-09: 10 mL

## 2017-06-09 MED ORDER — PHENYLEPHRINE HCL 10 MG/ML IJ SOLN
INTRAMUSCULAR | Status: DC | PRN
  Administered 2017-06-09 (×2): 80 ug via INTRAVENOUS
  Administered 2017-06-09: 40 ug via INTRAVENOUS
  Administered 2017-06-09 (×2): 80 ug via INTRAVENOUS

## 2017-06-09 MED ORDER — PROPOFOL 10 MG/ML IV BOLUS
INTRAVENOUS | Status: AC
  Filled 2017-06-09: qty 20

## 2017-06-09 MED ORDER — PHENYLEPHRINE 40 MCG/ML (10ML) SYRINGE FOR IV PUSH (FOR BLOOD PRESSURE SUPPORT)
PREFILLED_SYRINGE | INTRAVENOUS | Status: AC
  Filled 2017-06-09: qty 10

## 2017-06-09 MED ORDER — FENTANYL CITRATE (PF) 100 MCG/2ML IJ SOLN
INTRAMUSCULAR | Status: DC | PRN
  Administered 2017-06-09: 25 ug via INTRAVENOUS

## 2017-06-09 MED ORDER — LACTATED RINGERS IV SOLN
INTRAVENOUS | Status: DC
  Administered 2017-06-09: 13:00:00 via INTRAVENOUS

## 2017-06-09 MED ORDER — PROPOFOL 10 MG/ML IV BOLUS
INTRAVENOUS | Status: DC | PRN
  Administered 2017-06-09: 150 mg via INTRAVENOUS

## 2017-06-09 MED ORDER — ONDANSETRON HCL 4 MG/2ML IJ SOLN
INTRAMUSCULAR | Status: AC
  Filled 2017-06-09: qty 2

## 2017-06-09 SURGICAL SUPPLY — 26 items
BAG URO CATCHER STRL LF (MISCELLANEOUS) ×3 IMPLANT
BASKET LASER NITINOL 1.9FR (BASKET) IMPLANT
BSKT STON RTRVL 120 1.9FR (BASKET)
CATH INTERMIT  6FR 70CM (CATHETERS) ×3 IMPLANT
CLOTH BEACON ORANGE TIMEOUT ST (SAFETY) ×3 IMPLANT
COVER FOOTSWITCH UNIV (MISCELLANEOUS) IMPLANT
COVER SURGICAL LIGHT HANDLE (MISCELLANEOUS) ×3 IMPLANT
FIBER LASER FLEXIVA 1000 (UROLOGICAL SUPPLIES) IMPLANT
FIBER LASER FLEXIVA 365 (UROLOGICAL SUPPLIES) IMPLANT
FIBER LASER FLEXIVA 550 (UROLOGICAL SUPPLIES) IMPLANT
FIBER LASER TRAC TIP (UROLOGICAL SUPPLIES) ×2 IMPLANT
GLOVE BIOGEL M STRL SZ7.5 (GLOVE) ×3 IMPLANT
GOWN STRL REUS W/TWL LRG LVL3 (GOWN DISPOSABLE) ×6 IMPLANT
GUIDEWIRE ANG ZIPWIRE 038X150 (WIRE) ×3 IMPLANT
GUIDEWIRE STR DUAL SENSOR (WIRE) ×3 IMPLANT
IV NS 1000ML (IV SOLUTION) ×3
IV NS 1000ML BAXH (IV SOLUTION) ×1 IMPLANT
MANIFOLD NEPTUNE II (INSTRUMENTS) ×3 IMPLANT
PACK CYSTO (CUSTOM PROCEDURE TRAY) ×3 IMPLANT
SHEATH ACCESS URETERAL 24CM (SHEATH) IMPLANT
SHEATH ACCESS URETERAL 38CM (SHEATH) IMPLANT
SHEATH ACCESS URETERAL 54CM (SHEATH) IMPLANT
SYR CONTROL 10ML LL (SYRINGE) ×3 IMPLANT
TUBE FEEDING 8FR 16IN STR KANG (MISCELLANEOUS) ×3 IMPLANT
TUBING CONNECTING 10 (TUBING) ×2 IMPLANT
TUBING CONNECTING 10' (TUBING) ×1

## 2017-06-09 NOTE — Interval H&P Note (Signed)
History and Physical Interval Note:  06/09/2017 3:40 PM  Zachary Warren  has presented today for surgery, with the diagnosis of LEFT URETERAL STONE  The various methods of treatment have been discussed with the patient and family. After consideration of risks, benefits and other options for treatment, the patient has consented to  Procedure(s): CYSTOSCOPY WITH RETROGRADE PYELOGRAM, URETEROSCOPY AND STENT REPLACEMENT (Left) HOLMIUM LASER APPLICATION (Left) as a surgical intervention .  The patient's history has been reviewed, patient examined, no change in status, stable for surgery.  I have reviewed the patient's chart and labs.  Questions were answered to the patient's satisfaction.     [name]

## 2017-06-09 NOTE — Anesthesia Preprocedure Evaluation (Addendum)
Anesthesia Evaluation  Patient identified by MRN, date of birth, ID band Patient awake    Reviewed: Allergy & Precautions, NPO status , Patient's Chart, lab work & pertinent test results  Airway Mallampati: II  TM Distance: >3 FB Neck ROM: Full    Dental no notable dental hx.    Pulmonary sleep apnea , COPD, former smoker,    Pulmonary exam normal breath sounds clear to auscultation       Cardiovascular hypertension, + CAD and + CABG  + dysrhythmias Atrial Fibrillation + pacemaker  Rhythm:Irregular Rate:Normal  Left ventricle: The cavity size was normal. Wall thickness was   increased in a pattern of moderate LVH. Systolic function was   normal. The estimated ejection fraction was in the range of 55%   to 60%. Wall motion was normal; there were no regional wall   motion abnormalities. The study is not technically sufficient to   allow evaluation of LV diastolic function. - Aortic valve: Sclerosis without stenosis. There was no   regurgitation. - Mitral valve: Mildly thickened leaflets . There was mild   regurgitation. - Left atrium: Moderately dilated. - Right ventricle: The cavity size was mildly dilated. Pacer wire   or catheter noted in right ventricle. Systolic function was   normal. - Right atrium: The atrium was at the upper limits of normal in   size. Pacer wire or catheter noted in right atrium. - Tricuspid valve: There was moderate regurgitation. - Pulmonary arteries: PA peak pressure: 54 mm Hg (S). - Inferior vena cava: The vessel was dilated. The respirophasic   diameter changes were blunted (< 50%), consistent with elevated   central venous pressure.  Impressions:  - Compared to a prior study in 2011, the LVEF has increased from   45-50% up to 55-60%.   Neuro/Psych negative neurological ROS  negative psych ROS   GI/Hepatic negative GI ROS, Neg liver ROS,   Endo/Other  negative endocrine ROS   Renal/GU negative Renal ROS  negative genitourinary   Musculoskeletal negative musculoskeletal ROS (+)   Abdominal   Peds negative pediatric ROS (+)  Hematology negative hematology ROS (+)   Anesthesia Other Findings   Reproductive/Obstetrics negative OB ROS                             Anesthesia Physical Anesthesia Plan  ASA: III  Anesthesia Plan: General   Post-op Pain Management:    Induction: Intravenous  PONV Risk Score and Plan: 1 and Ondansetron and Dexamethasone  Airway Management Planned: LMA  Additional Equipment:   Intra-op Plan:   Post-operative Plan: Extubation in OR  Informed Consent: I have reviewed the patients History and Physical, chart, labs and discussed the procedure including the risks, benefits and alternatives for the proposed anesthesia with the patient or authorized representative who has indicated his/her understanding and acceptance.   Dental advisory given  Plan Discussed with: CRNA and Surgeon  Anesthesia Plan Comments:         Anesthesia Quick Evaluation

## 2017-06-09 NOTE — Anesthesia Procedure Notes (Signed)
Procedure Name: LMA Insertion Date/Time: 06/09/2017 3:54 PM Performed by: Glory Buff Pre-anesthesia Checklist: Patient identified, Emergency Drugs available, Suction available and Patient being monitored Patient Re-evaluated:Patient Re-evaluated prior to induction Oxygen Delivery Method: Circle system utilized Preoxygenation: Pre-oxygenation with 100% oxygen Induction Type: IV induction LMA: LMA inserted LMA Size: 4.0 Number of attempts: 1 Placement Confirmation: positive ETCO2 Tube secured with: Tape Dental Injury: Teeth and Oropharynx as per pre-operative assessment

## 2017-06-09 NOTE — Transfer of Care (Signed)
Immediate Anesthesia Transfer of Care Note  Patient: Zachary Warren  Procedure(s) Performed: Procedure(s): CYSTOSCOPY WITH RETROGRADE PYELOGRAM, URETEROSCOPY (Left) HOLMIUM LASER APPLICATION (Left)  Patient Location: PACU  Anesthesia Type:General  Level of Consciousness: awake, alert  and confused  Airway & Oxygen Therapy: Patient Spontanous Breathing and Patient connected to face mask oxygen  Post-op Assessment: Report given to RN and Post -op Vital signs reviewed and stable  Post vital signs: Reviewed and stable  Last Vitals:  Vitals:   06/09/17 1240 06/09/17 1642  BP: (!) 120/55 123/69  Pulse: 79 82  Resp: 18 15  Temp: 37.3 C (P) 36.4 C  SpO2: 97% 99%    Last Pain:  Vitals:   06/09/17 1240  TempSrc: Oral      Patients Stated Pain Goal: 4 (56/38/93 7342)  Complications: No apparent anesthesia complications

## 2017-06-09 NOTE — Progress Notes (Signed)
Called transportation service, [employer], and spoke with [place] [phone]). She stated a driver will arrive to pick pt up for transport to [place] around [phone]. Pt and family updated.  Coolidge Breeze, RN 06/09/2017

## 2017-06-09 NOTE — Brief Op Note (Signed)
06/09/2017  4:32 PM  PATIENT:  Zachary Warren  83 y.o. male  PRE-OPERATIVE DIAGNOSIS:  LEFT URETERAL STONE  POST-OPERATIVE DIAGNOSIS:  LEFT URETERAL STONE  PROCEDURE:  Procedure(s): CYSTOSCOPY WITH RETROGRADE PYELOGRAM, URETEROSCOPY (Left) HOLMIUM LASER APPLICATION (Left)  SURGEON:  Surgeon(s) and Role:    Alexis Frock, MD - Primary  PHYSICIAN ASSISTANT:   ASSISTANTS: none   ANESTHESIA:   general  EBL:  No intake/output data recorded.  BLOOD ADMINISTERED:none  DRAINS: none   LOCAL MEDICATIONS USED:  NONE  SPECIMEN:  Source of Specimen:  left ureteral stone fragments  DISPOSITION OF SPECIMEN:  [facility] for compositional analysis  COUNTS:  YES  TOURNIQUET:  * No tourniquets in log *  DICTATION: .Other Dictation: Dictation Number [id number]  PLAN OF CARE: Discharge to home after PACU  PATIENT DISPOSITION:  PACU - hemodynamically stable.   Delay start of Pharmacological VTE agent (>24hrs) due to surgical blood loss or risk of bleeding: yes

## 2017-06-09 NOTE — Anesthesia Postprocedure Evaluation (Signed)
Anesthesia Post Note  Patient: Zachary Warren  Procedure(s) Performed: Procedure(s) (LRB): CYSTOSCOPY WITH RETROGRADE PYELOGRAM, URETEROSCOPY (Left) HOLMIUM LASER APPLICATION (Left)     Patient location during evaluation: PACU Anesthesia Type: General Level of consciousness: awake and alert Pain management: pain level controlled Vital Signs Assessment: post-procedure vital signs reviewed and stable Respiratory status: spontaneous breathing, nonlabored ventilation, respiratory function stable and patient connected to nasal cannula oxygen Cardiovascular status: blood pressure returned to baseline and stable Postop Assessment: no signs of nausea or vomiting Anesthetic complications: no    Last Vitals:  Vitals:   06/09/17 1715 06/09/17 1728  BP: 138/79 (!) 145/58  Pulse: 84   Resp: 14   Temp:  36.8 C  SpO2: 94% 95%    Last Pain:  Vitals:   06/09/17 1642  TempSrc:   PainSc: Asleep                 [employer]aye [employer] S

## 2017-06-09 NOTE — Progress Notes (Signed)
Attempted several times to call report to [facility] at [phone]. No answer at that number, no other number available in paperwork or via web search. Discharge instruction information placed in packet for transportation service back to facility. Note placed in packet for RN to call physician's office with any questions as our department will be closed upon pt's arrival to facility.  Coolidge Breeze, RN 06/09/2017

## 2017-06-10 ENCOUNTER — Encounter (HOSPITAL_COMMUNITY): Payer: Self-pay | Admitting: Urology

## 2017-06-10 NOTE — Op Note (Signed)
NAME:  Zachary Warren, Zachary Warren                    ACCOUNT NO.:  MEDICAL RECORD NO.:  [id number]  LOCATION:                                 FACILITY:  PHYSICIAN:  Alexis Frock, MD     DATE OF BIRTH:  Dec 25, 1932  DATE OF PROCEDURE:  06/09/2017                               OPERATIVE REPORT   DIAGNOSIS:  Left distal ureteral stone.  PROCEDURES: 1. Cystoscopy with left retrograde pyelogram interpretation. 2. Left ureteroscopy with laser lithotripsy. 3. Left ureteral stent removal.  FINDINGS: 1. Left distal ureteral stone. 2. Complete resolution of all stone fragments within the left ureter     following laser lithotripsy and basket extraction. 3. Wide open very patent ureter from the kidney to the bladder     following laser lithotripsy and basket extraction.  It was felt     that stenting was not warranted.  INDICATION:  Mr. Charter is an 83 year old gentleman with history of neurologic disease, who was found approximately 4 to 6 weeks ago to have a left distal ureteral stone.  At that time, he had some questionable infectious parameters.  Therefore, he underwent temporizing with ureteral stenting to allow for renal decompression and clearance of infectious parameters which have since resolved.  His final culture from that time has been negative.  He now presents for definitive stone management with ureteroscopy.  Informed consent was obtained and placed in the medical record.  PROCEDURE IN DETAIL:  The patient being Zachary Warren was identified. Procedure being left ureteroscopic stone manipulation was confirmed. Procedure was carried out.  Time-out was performed.  Intravenous antibiotics were administered.  General anesthesia introduced.  The patient placed into a low lithotomy position.  Sterile field was created by prepping and draping the patient's penis, perineum, and proximal thighs using iodine.  Next, cystourethroscopy was performed using a 22- French rigid cystoscope with  offset lens.  Inspection of the anterior and posterior urethra was unremarkable.  Inspection of the bladder reveals a distal end of the left ureteral stent in situ.  This was grasped and brought to the level of the urethral meatus through which a 0.038 Zip wire was advanced to the upper pole and exchanged for an open- ended catheter and left retrograde pyelogram was obtained.  Left retrograde pyelogram demonstrated a single left ureter with single system left kidney.  There was a partially mobile filling defect in the mid to distal left ureter consistent with known stone.  It was quite an off void.  The Zip wire was once again advanced to the upper pole, set aside as a safety wire.  An 8-French feeding tube placed in the urinary bladder for pressure release.  Next, a semi-rigid ureteroscopy was performed alongside a separate Sensor working wire.  As expected, there was an off void stone and approximately 1 cm in the long axis in the mid to distal left ureter at approximately the area of the iliac crossing. This was pretty much too large for simple basketing.  As such, holmium laser energy applied to the stone using settings of 0.2 joules and 20 Hz and approximately 40% of the stone was ablated using a dusting  technique.  The remainder portion fragmented into pieces approximately 0.5 to 1 mm in diameter.  These were then sequentially grasped on the long axis and removed at the level of the urinary bladder using an escape basket.  Following these maneuvers, there was complete resolution of all stone fragments larger than 1/3rd of a millimeter within the left ureter and the ureter remains widely patent.  It was not felt that interval stenting was warranted.  Cystoscope was used to retrieve the stone fragments in the bladder which was set aside for compositional analysis.  Bladder was emptied per cystoscope.  Procedure was then terminated.  The patient tolerated the procedure well.  There  were no immediate periprocedural complications.  The patient was taken to the postanesthesia care unit in stable condition with plan back to skilled nursing facility to follow.          ______________________________ Alexis Frock, MD     TM/MEDQ  D:  06/09/2017  T:  06/10/2017  Job:  [id number]

## 2017-06-29 DIAGNOSIS — N202 Calculus of kidney with calculus of ureter: Secondary | ICD-10-CM | POA: Diagnosis not present

## 2017-07-10 ENCOUNTER — Encounter (HOSPITAL_COMMUNITY): Payer: Self-pay

## 2017-07-10 ENCOUNTER — Emergency Department (HOSPITAL_COMMUNITY): Payer: Medicare Other

## 2017-07-10 ENCOUNTER — Inpatient Hospital Stay (HOSPITAL_COMMUNITY)
Admission: EM | Admit: 2017-07-10 | Discharge: 2017-07-13 | DRG: 871 | Disposition: A | Discharge status: Home Health | Payer: Medicare Other | Attending: Family Medicine | Admitting: Family Medicine

## 2017-07-10 DIAGNOSIS — I509 Heart failure, unspecified: Secondary | ICD-10-CM

## 2017-07-10 DIAGNOSIS — K219 Gastro-esophageal reflux disease without esophagitis: Secondary | ICD-10-CM | POA: Diagnosis present

## 2017-07-10 DIAGNOSIS — I5023 Acute on chronic systolic (congestive) heart failure: Secondary | ICD-10-CM | POA: Diagnosis not present

## 2017-07-10 DIAGNOSIS — J189 Pneumonia, unspecified organism: Secondary | ICD-10-CM | POA: Diagnosis present

## 2017-07-10 DIAGNOSIS — Z8546 Personal history of malignant neoplasm of prostate: Secondary | ICD-10-CM

## 2017-07-10 DIAGNOSIS — R6510 Systemic inflammatory response syndrome (SIRS) of non-infectious origin without acute organ dysfunction: Secondary | ICD-10-CM | POA: Diagnosis not present

## 2017-07-10 DIAGNOSIS — I10 Essential (primary) hypertension: Secondary | ICD-10-CM | POA: Diagnosis present

## 2017-07-10 DIAGNOSIS — R05 Cough: Secondary | ICD-10-CM | POA: Diagnosis not present

## 2017-07-10 DIAGNOSIS — I481 Persistent atrial fibrillation: Secondary | ICD-10-CM | POA: Diagnosis present

## 2017-07-10 DIAGNOSIS — J181 Lobar pneumonia, unspecified organism: Secondary | ICD-10-CM

## 2017-07-10 DIAGNOSIS — E785 Hyperlipidemia, unspecified: Secondary | ICD-10-CM | POA: Diagnosis present

## 2017-07-10 DIAGNOSIS — G4733 Obstructive sleep apnea (adult) (pediatric): Secondary | ICD-10-CM | POA: Diagnosis present

## 2017-07-10 DIAGNOSIS — I482 Chronic atrial fibrillation: Secondary | ICD-10-CM | POA: Diagnosis not present

## 2017-07-10 DIAGNOSIS — F0390 Unspecified dementia without behavioral disturbance: Secondary | ICD-10-CM | POA: Diagnosis present

## 2017-07-10 DIAGNOSIS — Y95 Nosocomial condition: Secondary | ICD-10-CM | POA: Diagnosis present

## 2017-07-10 DIAGNOSIS — A419 Sepsis, unspecified organism: Secondary | ICD-10-CM | POA: Diagnosis present

## 2017-07-10 DIAGNOSIS — R509 Fever, unspecified: Secondary | ICD-10-CM | POA: Diagnosis present

## 2017-07-10 DIAGNOSIS — I2510 Atherosclerotic heart disease of native coronary artery without angina pectoris: Secondary | ICD-10-CM | POA: Diagnosis not present

## 2017-07-10 DIAGNOSIS — Z880 Allergy status to penicillin: Secondary | ICD-10-CM

## 2017-07-10 DIAGNOSIS — I110 Hypertensive heart disease with heart failure: Secondary | ICD-10-CM | POA: Diagnosis not present

## 2017-07-10 DIAGNOSIS — Z951 Presence of aortocoronary bypass graft: Secondary | ICD-10-CM

## 2017-07-10 DIAGNOSIS — G3184 Mild cognitive impairment, so stated: Secondary | ICD-10-CM | POA: Diagnosis present

## 2017-07-10 DIAGNOSIS — Z888 Allergy status to other drugs, medicaments and biological substances status: Secondary | ICD-10-CM

## 2017-07-10 DIAGNOSIS — Z7982 Long term (current) use of aspirin: Secondary | ICD-10-CM

## 2017-07-10 DIAGNOSIS — Z923 Personal history of irradiation: Secondary | ICD-10-CM

## 2017-07-10 DIAGNOSIS — Z950 Presence of cardiac pacemaker: Secondary | ICD-10-CM | POA: Diagnosis present

## 2017-07-10 DIAGNOSIS — J440 Chronic obstructive pulmonary disease with acute lower respiratory infection: Secondary | ICD-10-CM | POA: Diagnosis not present

## 2017-07-10 DIAGNOSIS — Z87891 Personal history of nicotine dependence: Secondary | ICD-10-CM

## 2017-07-10 DIAGNOSIS — Z881 Allergy status to other antibiotic agents status: Secondary | ICD-10-CM

## 2017-07-10 DIAGNOSIS — E876 Hypokalemia: Secondary | ICD-10-CM | POA: Diagnosis present

## 2017-07-10 DIAGNOSIS — I69391 Dysphagia following cerebral infarction: Secondary | ICD-10-CM

## 2017-07-10 DIAGNOSIS — I071 Rheumatic tricuspid insufficiency: Secondary | ICD-10-CM | POA: Diagnosis present

## 2017-07-10 DIAGNOSIS — D649 Anemia, unspecified: Secondary | ICD-10-CM | POA: Diagnosis present

## 2017-07-10 DIAGNOSIS — Z7951 Long term (current) use of inhaled steroids: Secondary | ICD-10-CM

## 2017-07-10 LAB — COMPREHENSIVE METABOLIC PANEL
ALK PHOS: 77 U/L (ref 38–126)
ALT: 18 U/L (ref 17–63)
ANION GAP: 10 (ref 5–15)
AST: 19 U/L (ref 15–41)
Albumin: 3.7 g/dL (ref 3.5–5.0)
BILIRUBIN TOTAL: 0.7 mg/dL (ref 0.3–1.2)
BUN: 20 mg/dL (ref 6–20)
CALCIUM: 8.9 mg/dL (ref 8.9–10.3)
CO2: 24 mmol/L (ref 22–32)
CREATININE: 0.85 mg/dL (ref 0.61–1.24)
Chloride: 112 mmol/L — ABNORMAL HIGH (ref 101–111)
GFR calc non Af Amer: >60 mL/min (ref >60)
GLUCOSE: 181 mg/dL — AB (ref 65–99)
Potassium: 3.2 mmol/L — ABNORMAL LOW (ref 3.5–5.1)
Sodium: 146 mmol/L — ABNORMAL HIGH (ref 135–145)
TOTAL PROTEIN: 7.3 g/dL (ref 6.5–8.1)

## 2017-07-10 LAB — CBC WITH DIFFERENTIAL/PLATELET
BASOS ABS: 0 10*3/uL (ref 0.0–0.1)
BASOS PCT: 0 %
EOS ABS: 0 10*3/uL (ref 0.0–0.7)
Eosinophils Relative: 0 %
HEMATOCRIT: 41.4 % (ref 39.0–52.0)
Hemoglobin: 12.7 g/dL — ABNORMAL LOW (ref 13.0–17.0)
Lymphocytes Relative: 3 %
Lymphs Abs: 0.5 10*3/uL — ABNORMAL LOW (ref 0.7–4.0)
MCH: 29 pg (ref 26.0–34.0)
MCHC: 30.7 g/dL (ref 30.0–36.0)
MCV: 94.5 fL (ref 78.0–100.0)
MONO ABS: 1.4 10*3/uL — AB (ref 0.1–1.0)
MONOS PCT: 10 %
NEUTROS ABS: 13.2 10*3/uL — AB (ref 1.7–7.7)
NEUTROS PCT: 87 %
Platelets: 265 10*3/uL (ref 150–400)
RBC: 4.38 MIL/uL (ref 4.22–5.81)
RDW: 13.7 % (ref 11.5–15.5)
WBC: 15.1 10*3/uL — ABNORMAL HIGH (ref 4.0–10.5)

## 2017-07-10 LAB — URINALYSIS, ROUTINE W REFLEX MICROSCOPIC
BACTERIA UA: NONE SEEN
BILIRUBIN URINE: NEGATIVE
GLUCOSE, UA: NEGATIVE mg/dL
KETONES UR: NEGATIVE mg/dL
LEUKOCYTES UA: NEGATIVE
NITRITE: NEGATIVE
PROTEIN: 30 mg/dL — AB
Specific Gravity, Urine: 1.018 (ref 1.005–1.030)
Squamous Epithelial / LPF: NONE SEEN
pH: 6 (ref 5.0–8.0)

## 2017-07-10 LAB — TROPONIN I: Troponin I: <0.03 ng/mL (ref <0.03)

## 2017-07-10 LAB — I-STAT CG4 LACTIC ACID, ED: Lactic Acid, Venous: 1.28 mmol/L (ref 0.5–1.9)

## 2017-07-10 LAB — BRAIN NATRIURETIC PEPTIDE: B NATRIURETIC PEPTIDE 5: 631 pg/mL — AB (ref 0.0–100.0)

## 2017-07-10 MED ORDER — IPRATROPIUM-ALBUTEROL 0.5-2.5 (3) MG/3ML IN SOLN
3.0000 mL | Freq: Once | RESPIRATORY_TRACT | Status: AC
  Administered 2017-07-10: 3 mL via RESPIRATORY_TRACT
  Filled 2017-07-10: qty 3

## 2017-07-10 MED ORDER — CALCIUM CARBONATE ANTACID 500 MG PO CHEW
1.0000 | CHEWABLE_TABLET | Freq: Two times a day (BID) | ORAL | Status: DC
  Administered 2017-07-11 – 2017-07-13 (×6): 200 mg via ORAL
  Filled 2017-07-10 (×6): qty 1

## 2017-07-10 MED ORDER — LEVOFLOXACIN IN D5W 750 MG/150ML IV SOLN
750.0000 mg | INTRAVENOUS | Status: DC
Start: 2017-07-11 — End: 2017-07-13
  Administered 2017-07-11 – 2017-07-12 (×2): 750 mg via INTRAVENOUS
  Filled 2017-07-10 (×2): qty 150

## 2017-07-10 MED ORDER — DEXTROSE 5 % IV SOLN
2.0000 g | Freq: Once | INTRAVENOUS | Status: AC
  Administered 2017-07-10: 2 g via INTRAVENOUS
  Filled 2017-07-10: qty 2

## 2017-07-10 MED ORDER — VANCOMYCIN HCL IN DEXTROSE 1-5 GM/200ML-% IV SOLN: 1000.0000 mg | Freq: Once | INTRAVENOUS | Status: DC

## 2017-07-10 MED ORDER — TOPIRAMATE 25 MG PO TABS
50.0000 mg | ORAL_TABLET | Freq: Two times a day (BID) | ORAL | Status: DC
  Administered 2017-07-11 – 2017-07-13 (×5): 50 mg via ORAL
  Filled 2017-07-10 (×5): qty 2

## 2017-07-10 MED ORDER — SODIUM CHLORIDE 0.9% FLUSH: 3.0000 mL | INTRAVENOUS | Status: DC | PRN

## 2017-07-10 MED ORDER — OMEPRAZOLE 20 MG PO CPDR
20.0000 mg | DELAYED_RELEASE_CAPSULE | Freq: Two times a day (BID) | ORAL | Status: DC
  Filled 2017-07-10 (×3): qty 1

## 2017-07-10 MED ORDER — SODIUM CHLORIDE 0.9 % IV SOLN: 250.0000 mL | INTRAVENOUS | Status: DC | PRN

## 2017-07-10 MED ORDER — DEXTROSE 5 % IV SOLN
1.0000 g | Freq: Three times a day (TID) | INTRAVENOUS | Status: DC
  Administered 2017-07-11 – 2017-07-12 (×4): 1 g via INTRAVENOUS
  Filled 2017-07-10 (×9): qty 1

## 2017-07-10 MED ORDER — SODIUM CHLORIDE 0.9% FLUSH
3.0000 mL | Freq: Two times a day (BID) | INTRAVENOUS | Status: DC
  Administered 2017-07-11 – 2017-07-12 (×4): 3 mL via INTRAVENOUS

## 2017-07-10 MED ORDER — FLUOXETINE HCL 20 MG PO CAPS
40.0000 mg | ORAL_CAPSULE | Freq: Every day | ORAL | Status: DC
  Administered 2017-07-11 – 2017-07-13 (×3): 40 mg via ORAL
  Filled 2017-07-10 (×3): qty 2

## 2017-07-10 MED ORDER — VANCOMYCIN HCL 10 G IV SOLR
1500.0000 mg | Freq: Once | INTRAVENOUS | Status: AC
  Administered 2017-07-10: 1500 mg via INTRAVENOUS
  Filled 2017-07-10 (×2): qty 1500

## 2017-07-10 MED ORDER — VANCOMYCIN HCL IN DEXTROSE 1-5 GM/200ML-% IV SOLN
1000.0000 mg | Freq: Two times a day (BID) | INTRAVENOUS | Status: DC
  Administered 2017-07-11 – 2017-07-12 (×3): 1000 mg via INTRAVENOUS
  Filled 2017-07-10 (×3): qty 200

## 2017-07-10 MED ORDER — ACETAMINOPHEN 325 MG PO TABS
650.0000 mg | ORAL_TABLET | Freq: Once | ORAL | Status: AC
  Administered 2017-07-10: 650 mg via ORAL
  Filled 2017-07-10: qty 2

## 2017-07-10 MED ORDER — METOPROLOL TARTRATE 25 MG PO TABS
12.5000 mg | ORAL_TABLET | Freq: Two times a day (BID) | ORAL | Status: DC
  Administered 2017-07-11 – 2017-07-13 (×6): 12.5 mg via ORAL
  Filled 2017-07-10 (×6): qty 1

## 2017-07-10 MED ORDER — LEVOFLOXACIN IN D5W 750 MG/150ML IV SOLN
750.0000 mg | Freq: Once | INTRAVENOUS | Status: AC
  Administered 2017-07-10: 750 mg via INTRAVENOUS
  Filled 2017-07-10: qty 150

## 2017-07-10 MED ORDER — DILTIAZEM HCL ER COATED BEADS 180 MG PO CP24
180.0000 mg | ORAL_CAPSULE | Freq: Two times a day (BID) | ORAL | Status: DC
  Administered 2017-07-11 – 2017-07-13 (×6): 180 mg via ORAL
  Filled 2017-07-10 (×6): qty 1

## 2017-07-10 MED ORDER — GUAIFENESIN ER 600 MG PO TB12
600.0000 mg | ORAL_TABLET | Freq: Two times a day (BID) | ORAL | Status: DC
  Administered 2017-07-11 – 2017-07-13 (×5): 600 mg via ORAL
  Filled 2017-07-10 (×5): qty 1

## 2017-07-10 MED ORDER — ASPIRIN EC 81 MG PO TBEC
81.0000 mg | DELAYED_RELEASE_TABLET | Freq: Every day | ORAL | Status: DC
  Administered 2017-07-11 – 2017-07-13 (×3): 81 mg via ORAL
  Filled 2017-07-10 (×3): qty 1

## 2017-07-10 MED ORDER — ADULT MULTIVITAMIN W/MINERALS CH
1.0000 | ORAL_TABLET | Freq: Every day | ORAL | Status: DC
  Administered 2017-07-11 – 2017-07-13 (×3): 1 via ORAL
  Filled 2017-07-10 (×3): qty 1

## 2017-07-10 MED ORDER — FUROSEMIDE 10 MG/ML IJ SOLN
20.0000 mg | Freq: Once | INTRAMUSCULAR | Status: AC
  Administered 2017-07-10: 20 mg via INTRAVENOUS
  Filled 2017-07-10: qty 2

## 2017-07-10 MED ORDER — VITAMIN D 1000 UNITS PO TABS
1000.0000 [IU] | ORAL_TABLET | Freq: Every day | ORAL | Status: DC
  Administered 2017-07-11 – 2017-07-13 (×3): 1000 [IU] via ORAL
  Filled 2017-07-10 (×3): qty 1

## 2017-07-10 MED ORDER — SODIUM CHLORIDE 0.9 % IV SOLN
1000.0000 mL | INTRAVENOUS | Status: DC
  Administered 2017-07-10: 1000 mL via INTRAVENOUS

## 2017-07-10 MED ORDER — CHOLECALCIFEROL 10 MCG (400 UNIT) PO TABS
400.0000 [IU] | ORAL_TABLET | Freq: Two times a day (BID) | ORAL | Status: DC
  Administered 2017-07-11 – 2017-07-13 (×4): 400 [IU] via ORAL
  Filled 2017-07-10 (×5): qty 1

## 2017-07-10 NOTE — ED Provider Notes (Signed)
[place] DEPT Provider Note   CSN: [id number] Arrival date & time: 07/10/17  2027     History   Chief Complaint Chief Complaint  Patient presents with  . Fever  . Cough    HPI Zachary Warren is a 83 y.o. male.  The history is provided by the spouse and the patient. The history is limited by the condition of the patient (Hx dementia).  Fever   Associated symptoms include cough.  Cough   Pt was seen at 2035. Per pt and his wife: Pt with cough, congestion, generalized weakness and "chills" since yesterday. Pt's wife states pt was just discharged from rehab the day before his symptoms began. Denies CP, no abd pain, no N/V/D. Pt has significant hx of dementia.   Past Medical History:  Diagnosis Date  . COPD (chronic obstructive pulmonary disease) ([place])   . Coronary artery disease    s/p 5v CABG 2002  . CVA (cerebral infarction) 1999  . DDD (degenerative disc disease), cervical   . Dementia   . Depression   . GERD (gastroesophageal reflux disease)   . History of kidney stones   . Hyperlipidemia   . Hypertension   . Memory impairment   . OSA (obstructive sleep apnea)    Recently diagnosed though pt. has not had CPAP trial  . Pacemaker   . Persistent atrial fibrillation ([place])    on pradaxa  . Pneumonia    recent hospital admission for pneumonia   . Prostate cancer ([place]) 2006   s/p XRT  . PVD (peripheral vascular disease) ([place])   . Seizures ([place])   . Tachycardia-bradycardia [facility])    s/p PPM by [facility] 6/12    Patient Active Problem List   Diagnosis Date Noted  . Pyelonephritis 05/13/2017  . Hypokalemia 10/25/2014  . Sepsis ([place]) 10/24/2014  . CAP (community acquired pneumonia) 10/24/2014  . Atrial fibrillation with RVR ([place]) 10/24/2014  . MCI (mild cognitive impairment) with memory loss 07/24/2014  . Cardiac pacemaker in situ 12/25/2013  . Aneurysm ([place]) 06/28/2012  . SIRS (systemic inflammatory response syndrome) ([place]) 02/05/2012  . Encephalopathy acute  02/05/2012  . CVA (cerebral infarction) 02/05/2012  . Seizure ([place]) 02/05/2012  . Dysphagia as late effect of stroke 02/05/2012  . GERD (gastroesophageal reflux disease) 02/05/2012  . 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 Hyperlipidemia 11/21/2010  . Essential hypertension 11/21/2010  . CAD, NATIVE VESSEL 11/21/2010  . Atrial fibrillation ([place]) 11/21/2010  . PVD 11/21/2010    Past Surgical History:  Procedure Laterality Date  . CARDIOVERSION N/A 08/21/2013   Procedure: CARDIOVERSION;  Surgeon: Candee Furbish, MD;  Location: [facility];  Service: Cardiovascular;  Laterality: N/A;  . [place]  . CORONARY ARTERY BYPASS GRAFT  2002  . CYSTOSCOPY W/ URETERAL STENT PLACEMENT Left 05/13/2017   Procedure: CYSTOSCOPY WITH RETROGRADE PYELOGRAM/URETERAL STENT PLACEMENT LEFT;  Surgeon: Alexis Frock, MD;  Location: [facility];  Service: Urology;  Laterality: Left;  . CYSTOSCOPY WITH RETROGRADE PYELOGRAM, URETEROSCOPY AND STENT PLACEMENT Left 06/09/2017   Procedure: CYSTOSCOPY WITH RETROGRADE PYELOGRAM, URETEROSCOPY;  Surgeon: Alexis Frock, MD;  Location: [facility];  Service: Urology;  Laterality: Left;  . HOLMIUM LASER APPLICATION Left 8/67/6195   Procedure: HOLMIUM LASER APPLICATION;  Surgeon: Alexis Frock, MD;  Location: [facility];  Service: Urology;  Laterality: Left;  . INSERT / REPLACE / REMOVE PACEMAKER    . NECK SURGERY     hx of   . PACEMAKER INSERTION     implanted by [facility] 6/12  Home Medications    Prior to Admission medications   Medication Sig Start Date End Date Taking? Authorizing Provider  aspirin EC 81 MG tablet Take 81 mg by mouth daily.    [provider]  atorvastatin (LIPITOR) 40 MG tablet TAKE 1 TABLET BY MOUTH EVERY DAY - PATIENT DUE FOR LIPID PANEL Patient taking differently: TAKE 1 TABLET BY MOUTH EVERY DAY EVERY EVENING - PATIENT DUE FOR LIPID PANEL 06/01/17   Jerline Pain, MD  budesonide-formoterol (SYMBICORT) 80-4.5 MCG/ACT inhaler Inhale 2  puffs into the lungs daily.    [provider]  calcium carbonate (TUMS - DOSED IN MG ELEMENTAL CALCIUM) 500 MG chewable tablet Chew 1 tablet by mouth 2 (two) times daily.    [provider]  cholecalciferol (VITAMIN D) 1000 units tablet Take 1,000 Units by mouth daily.    [provider]  cholecalciferol (VITAMIN D) 400 units TABS tablet Take 400 Units by mouth 2 (two) times daily.    [provider]  diltiazem (CARDIZEM CD) 180 MG 24 hr capsule TAKE 1 CAPSULE BY MOUTH TWICE DAILY 09/17/16   Allred, Jeneen Rinks, MD  FLUoxetine (PROZAC) 40 MG capsule Take 40 mg by mouth daily.      [provider]  furosemide (LASIX) 20 MG tablet Take 20 mg by mouth daily.    [provider]  guaiFENesin (MUCINEX) 600 MG 12 hr tablet Take 600 mg by mouth 2 (two) times daily.    [provider]  metoprolol tartrate (LOPRESSOR) 25 MG tablet Take 0.5 tablets (12.5 mg total) by mouth 2 (two) times daily. 05/18/17   Regalado, Belkys A, MD  Multiple Vitamins-Minerals (MULTIVITAMIN WITH MINERALS) tablet Take 1 tablet by mouth daily. Chewable flinstones    [provider]  omeprazole (PRILOSEC OTC) 20 MG tablet Take 20 mg by mouth 2 (two) times daily.      [provider]  sulfamethoxazole-trimethoprim (BACTRIM DS,SEPTRA DS) 800-160 MG tablet Take 1 tablet by mouth 2 (two) times daily. For 3 days    [provider]  topiramate (TOPAMAX) 50 MG tablet Take 1 tablet (50 mg total) by mouth 2 (two) times daily. 07/25/15   Penumalli, Earlean Polka, MD  traMADol (ULTRAM) 50 MG tablet Take 1 tablet (50 mg total) by mouth every 6 (six) hours as needed. For post-op pain. 06/09/17 06/09/18  Alexis Frock, MD    Family History Family History  Problem Relation Age of Onset  . Cancer Mother        ovarian  . Ovarian cancer Mother   . Cancer Father        pancreatic  . Pancreatic cancer Father   . Cancer Brother        lung  . Ovarian cancer Unknown   .  Pancreatic cancer Unknown   . Lung cancer Unknown     Social History Social History  Substance Use Topics  . Smoking status: Former Smoker    Packs/day: 2.00    Years: 48.00    Types: Cigarettes    Quit date: 03/09/1998  . Smokeless tobacco: Never Used  . Alcohol use No     Comment: quit: 1999     Allergies   Aricept [donepezil hcl]; Ceclor [cefaclor]; Namenda [memantine hcl]; Penicillins; and Ramipril   Review of Systems Review of Systems  Unable to perform ROS: Dementia  Constitutional: Positive for fever.  Respiratory: Positive for cough.      Physical Exam Updated Vital Signs BP (!) 151/90 (BP Location:  Right Arm)   Pulse 96   Temp (!) 102.6 F (39.2 C) (Oral)   Resp (!) 26   Ht 5\' 9"  (1.753 m)   Wt 87.5 kg (193 lb)   SpO2 90%   BMI 28.50 kg/m   Patient Vitals for the past 24 hrs:  BP Temp Temp src Pulse Resp SpO2 Height Weight  07/10/17 2139 (!) 148/65 - - 97 (!) 25 94 % - -  07/10/17 2058 - - - - - 94 % - -  07/10/17 2033 (!) 151/90 (!) 102.6 F (39.2 C) Oral 96 (!) 26 90 % 5\' 9"  (1.753 m) 87.5 kg (193 lb)     Physical Exam 2040: Physical examination:  Nursing notes reviewed; Vital signs and O2 SAT reviewed; +febrile.;; Constitutional: Well developed, Well nourished, In no acute distress; Head:  Normocephalic, atraumatic; Eyes: EOMI, PERRL, No scleral icterus; ENMT: Mouth and pharynx normal, Mucous membranes moist; Neck: Supple, Full range of motion, No lymphadenopathy; Cardiovascular: Irregular tachycardic rate and rhythm, No gallop; Respiratory: Breath sounds coarse & equal bilaterally, No wheezes. +moist cough during exam. Mild tachypnea. Normal respiratory effort/excursion; Chest: Nontender, Movement normal; Abdomen: Soft, Nontender, Nondistended, Normal bowel sounds; Genitourinary: No CVA tenderness; Extremities: Pulses normal, No tenderness, +2 pedal edema bilat. No calf asymmetry.; Neuro: Awake, alert, confused per hx dementia. No facial droop. Speech  clear. Moves all extremities spontaneously and to command without apparent gross focal motor deficits.; Skin: Color normal, Warm, Dry.   ED Treatments / Results  Labs (all labs ordered are listed, but only abnormal results are displayed)   EKG  EKG Interpretation  Date/Time:  Saturday July 10 2017 20:46:10 EDT Ventricular Rate:  108 PR Interval:    QRS Duration: 96 QT Interval:  315 QTC Calculation: 423 R Axis:   81 Text Interpretation:  Atrial fibrillation Borderline right axis deviation Low voltage, precordial leads Anteroseptal infarct, old Nonspecific repol abnormality, diffuse leads Baseline wander When compared with ECG of 05/13/2017 Rate faster and Nonspecific ST and T wave abnormality is more pronounced Confirmed by Francine Graven [phone]) on 07/10/2017 8:54:42 PM       Radiology   Procedures Procedures (including critical care time)  Medications Ordered in ED Medications  0.9 %  sodium chloride infusion (not administered)  levofloxacin (LEVAQUIN) IVPB 750 mg (not administered)  aztreonam (AZACTAM) 2 g in dextrose 5 % 50 mL IVPB (not administered)  ipratropium-albuterol (DUONEB) 0.5-2.5 (3) MG/3ML nebulizer solution 3 mL (not administered)  acetaminophen (TYLENOL) tablet 650 mg (not administered)  vancomycin (VANCOCIN) 1,500 mg in sodium chloride 0.9 % 500 mL IVPB (not administered)     Initial Impression / Assessment and Plan / ED Course  I have reviewed the triage vital signs and the nursing notes.  Pertinent labs & imaging results that were available during my care of the patient were reviewed by me and considered in my medical decision making (see chart for details).  MDM Reviewed: previous chart, nursing note and vitals Reviewed previous: labs and ECG Interpretation: labs, ECG and x-ray Total time providing critical care: 30-74 minutes. This excludes time spent performing separately reportable procedures and services. Consults: admitting  MD   CRITICAL CARE Performed by: Alfonzo Feller Total critical care time: 35 minutes Critical care time was exclusive of separately billable procedures and treating other patients. Critical care was necessary to treat or prevent imminent or life-threatening deterioration. Critical care was time spent personally by me on the following activities: development of treatment plan with patient and/or surrogate  as well as nursing, discussions with consultants, evaluation of patient's response to treatment, examination of patient, obtaining history from patient or surrogate, ordering and performing treatments and interventions, ordering and review of laboratory studies, ordering and review of radiographic studies, pulse oximetry and re-evaluation of patient's condition.  Results for orders placed or performed during the hospital encounter of 07/10/17  Comprehensive metabolic panel  Result Value Ref Range   Sodium 146 (H) 135 - 145 mmol/L   Potassium 3.2 (L) 3.5 - 5.1 mmol/L   Chloride 112 (H) 101 - 111 mmol/L   CO2 24 22 - 32 mmol/L   Glucose, Bld 181 (H) 65 - 99 mg/dL   BUN 20 6 - 20 mg/dL   Creatinine, Ser 0.85 0.61 - 1.24 mg/dL   Calcium 8.9 8.9 - 10.3 mg/dL   Total Protein 7.3 6.5 - 8.1 g/dL   Albumin 3.7 3.5 - 5.0 g/dL   AST 19 15 - 41 U/L   ALT 18 17 - 63 U/L   Alkaline Phosphatase 77 38 - 126 U/L   Total Bilirubin 0.7 0.3 - 1.2 mg/dL   GFR calc non Af Amer >60 >60 mL/min   GFR calc Af Amer >60 >60 mL/min   Anion gap 10 5 - 15  CBC WITH DIFFERENTIAL  Result Value Ref Range   WBC 15.1 (H) 4.0 - 10.5 K/uL   RBC 4.38 4.22 - 5.81 MIL/uL   Hemoglobin 12.7 (L) 13.0 - 17.0 g/dL   HCT 41.4 39.0 - 52.0 %   MCV 94.5 78.0 - 100.0 fL   MCH 29.0 26.0 - 34.0 pg   MCHC 30.7 30.0 - 36.0 g/dL   RDW 13.7 11.5 - 15.5 %   Platelets 265 150 - 400 K/uL   Neutrophils Relative % 87 %   Neutro Abs 13.2 (H) 1.7 - 7.7 K/uL   Lymphocytes Relative 3 %   Lymphs Abs 0.5 (L) 0.7 - 4.0 K/uL   Monocytes  Relative 10 %   Monocytes Absolute 1.4 (H) 0.1 - 1.0 K/uL   Eosinophils Relative 0 %   Eosinophils Absolute 0.0 0.0 - 0.7 K/uL   Basophils Relative 0 %   Basophils Absolute 0.0 0.0 - 0.1 K/uL  Urinalysis, Routine w reflex microscopic  Result Value Ref Range   Color, Urine YELLOW YELLOW   APPearance CLEAR CLEAR   Specific Gravity, Urine 1.018 1.005 - 1.030   pH 6.0 5.0 - 8.0   Glucose, UA NEGATIVE NEGATIVE mg/dL   Hgb urine dipstick SMALL (A) NEGATIVE   Bilirubin Urine NEGATIVE NEGATIVE   Ketones, ur NEGATIVE NEGATIVE mg/dL   Protein, ur 30 (A) NEGATIVE mg/dL   Nitrite NEGATIVE NEGATIVE   Leukocytes, UA NEGATIVE NEGATIVE   RBC / HPF 6-30 0 - 5 RBC/hpf   WBC, UA 0-5 0 - 5 WBC/hpf   Bacteria, UA NONE SEEN NONE SEEN   Squamous Epithelial / LPF NONE SEEN NONE SEEN   Mucus PRESENT   Brain natriuretic peptide  Result Value Ref Range   B Natriuretic Peptide 631.0 (H) 0.0 - 100.0 pg/mL  Troponin I  Result Value Ref Range   Troponin I <0.03 <0.03 ng/mL  I-Stat CG4 Lactic Acid, ED  (not at  [facility])  Result Value Ref Range   Lactic Acid, Venous 1.28 0.5 - 1.9 mmol/L   Dg Chest Port 1 View Result Date: 07/10/2017 CLINICAL DATA:  Cough and fever. EXAM: PORTABLE CHEST 1 VIEW COMPARISON:  Radiographs 05/13/2017 FINDINGS: Post median sternotomy. Left-sided pacemaker in  place. Cardiomegaly with atherosclerotic thoracic aorta. Progressive retrocardiac opacity from prior exam likely combination of pleural fluid and airspace disease. Small right pleural effusion. Increased peribronchial thickening likely pulmonary edema. Lungs remain hyperinflated. No pneumothorax. IMPRESSION: 1. Cardiomegaly with peribronchial thickening favoring pulmonary edema. 2. Retrocardiac opacity likely combination of pleural fluid and airspace disease, atelectasis or pneumonia. Small right pleural effusion. Overall findings suggest CHF, concurrent left lower lobe pneumonia not excluded. Recommend continued radiographic follow-up  to resolution, as retrocardiac opacity has been present on prior exams. Electronically Signed   By: Jeb Levering M.D.   On: 07/10/2017 21:15    2210:  Code Sepsis called on pt's arrival to ED. Judicious IVF given due to hx CHF. APAP given for fever. Short neb given for cough. IV abx given after BC and UC obtained.  BNP elevated from previous with CHF on CXR; IV lasix given. Dx and testing d/w pt and family.  Questions answered.  Verb understanding, agreeable to admit. T/C to [facility]. Shanon Brow, case discussed, including:  HPI, pertinent PM/SHx, VS/PE, dx testing, ED course and treatment:  Agreeable to admit.     Final Clinical Impressions(s) / ED Diagnoses   Final diagnoses:  None    New Prescriptions New Prescriptions   No medications on file      Francine Graven, DO 07/14/17 1140

## 2017-07-10 NOTE — H&P (Signed)
History and Physical    Zachary Warren RJJ:[id number] DOB: December 15, 1932 DOA: 07/10/2017  PCP: Wenda Low, MD  Patient coming from:  home  Chief Complaint:  chills  HPI: Zachary Warren is a 83 y.o. male with medical history significant of recent d/c from rehab on Thursday went home with wife and has been having chills since Friday (today is sat).  Pt reports coughing for several days.  He is pleasantly easily confused.  He deneis any n/v/d.  No pain anywhere.  He did not know he was having a fever until he got to ED. Temp over 102.  Denies any sob.  But with productive cough.  Found to have pna and referred for admission for treatment of such.    Review of Systems: As per HPI otherwise 10 point review of systems negative ( confused mildy though so unknown how reliable)   Past Medical History:  Diagnosis Date  . COPD (chronic obstructive pulmonary disease) ([place])   . Coronary artery disease    s/p 5v CABG 2002  . CVA (cerebral infarction) 1999  . DDD (degenerative disc disease), cervical   . Dementia   . Depression   . GERD (gastroesophageal reflux disease)   . History of kidney stones   . Hyperlipidemia   . Hypertension   . Memory impairment   . OSA (obstructive sleep apnea)    Recently diagnosed though pt. has not had CPAP trial  . Pacemaker   . Persistent atrial fibrillation ([place])    on pradaxa  . Pneumonia    recent hospital admission for pneumonia   . Prostate cancer ([place]) 2006   s/p XRT  . PVD (peripheral vascular disease) ([place])   . Seizures ([place])   . Tachycardia-bradycardia [facility])    s/p PPM by [facility] 6/12    Past Surgical History:  Procedure Laterality Date  . CARDIOVERSION N/A 08/21/2013   Procedure: CARDIOVERSION;  Surgeon: Candee Furbish, MD;  Location: [place];  Service: Cardiovascular;  Laterality: N/A;  . [place]  . CORONARY ARTERY BYPASS GRAFT  2002  . CYSTOSCOPY W/ URETERAL STENT PLACEMENT Left 05/13/2017   Procedure: CYSTOSCOPY WITH RETROGRADE  PYELOGRAM/URETERAL STENT PLACEMENT LEFT;  Surgeon: Alexis Frock, MD;  Location: [facility];  Service: Urology;  Laterality: Left;  . CYSTOSCOPY WITH RETROGRADE PYELOGRAM, URETEROSCOPY AND STENT PLACEMENT Left 06/09/2017   Procedure: CYSTOSCOPY WITH RETROGRADE PYELOGRAM, URETEROSCOPY;  Surgeon: Alexis Frock, MD;  Location: [facility];  Service: Urology;  Laterality: Left;  . HOLMIUM LASER APPLICATION Left 0/16/0109   Procedure: HOLMIUM LASER APPLICATION;  Surgeon: Alexis Frock, MD;  Location: [facility];  Service: Urology;  Laterality: Left;  . INSERT / REPLACE / REMOVE PACEMAKER    . NECK SURGERY     hx of   . PACEMAKER INSERTION     implanted by [facility] 6/12     reports that he quit smoking about 19 years ago. His smoking use included Cigarettes. He has a 96.00 pack-year smoking history. He has never used smokeless tobacco. He reports that he does not drink alcohol or use drugs.  Allergies  Allergen Reactions  . Aricept [Donepezil Hcl] Other (See Comments)    "stomach pain", generic  . Ceclor [Cefaclor]   . Namenda [Memantine Hcl] Other (See Comments)    "dizzy", brand medication  . Penicillins     Has patient had a PCN reaction causing immediate rash, facial/tongue/throat swelling, SOB or lightheadedness with hypotension: Unknown Has patient had a PCN reaction causing severe rash  involving mucus membranes or skin necrosis: Unknown Has patient had a PCN reaction that required hospitalization: Unknown Has patient had a PCN reaction occurring within the last 10 years: Unknown If all of the above answers are "NO", then may proceed with Cephalosporin use.   . Ramipril Other (See Comments)     cough    Family History  Problem Relation Age of Onset  . Cancer Mother        ovarian  . Ovarian cancer Mother   . Cancer Father        pancreatic  . Pancreatic cancer Father   . Cancer Brother        lung  . Ovarian cancer Unknown   . Pancreatic cancer Unknown   . Lung cancer Unknown      Prior to Admission medications   Medication Sig Start Date End Date Taking? Authorizing Provider  aspirin EC 81 MG tablet Take 81 mg by mouth daily.   Yes [provider]  atorvastatin (LIPITOR) 40 MG tablet TAKE 1 TABLET BY MOUTH EVERY DAY - PATIENT DUE FOR LIPID PANEL Patient taking differently: TAKE 1 TABLET BY MOUTH EVERY DAY EVERY EVENING - PATIENT DUE FOR LIPID PANEL 06/01/17  Yes Jerline Pain, MD  budesonide-formoterol (SYMBICORT) 80-4.5 MCG/ACT inhaler Inhale 2 puffs into the lungs daily.   Yes [provider]  calcium carbonate (TUMS - DOSED IN MG ELEMENTAL CALCIUM) 500 MG chewable tablet Chew 1 tablet by mouth 2 (two) times daily.   Yes [provider]  cholecalciferol (VITAMIN D) 1000 units tablet Take 1,000 Units by mouth daily.   Yes [provider]  cholecalciferol (VITAMIN D) 400 units TABS tablet Take 400 Units by mouth 2 (two) times daily.   Yes [provider]  diltiazem (CARDIZEM CD) 180 MG 24 hr capsule TAKE 1 CAPSULE BY MOUTH TWICE DAILY 09/17/16  Yes Allred, Jeneen Rinks, MD  FLUoxetine (PROZAC) 40 MG capsule Take 40 mg by mouth daily.     Yes [provider]  furosemide (LASIX) 20 MG tablet Take 20 mg by mouth daily.   Yes [provider]  guaiFENesin (MUCINEX) 600 MG 12 hr tablet Take 600 mg by mouth 2 (two) times daily.   Yes [provider]  metoprolol tartrate (LOPRESSOR) 25 MG tablet Take 0.5 tablets (12.5 mg total) by mouth 2 (two) times daily. 05/18/17  Yes Regalado, Belkys A, MD  Multiple Vitamins-Minerals (MULTIVITAMIN WITH MINERALS) tablet Take 1 tablet by mouth daily. Chewable flinstones   Yes [provider]  omeprazole (PRILOSEC OTC) 20 MG tablet Take 20 mg by mouth 2 (two) times daily.     Yes [provider]  topiramate (TOPAMAX) 50 MG tablet Take 1 tablet (50 mg total) by mouth 2 (two) times daily. 07/25/15  Yes Penumalli, Earlean Polka, MD  traMADol (ULTRAM) 50 MG tablet Take 1  tablet (50 mg total) by mouth every 6 (six) hours as needed. For post-op pain. 06/09/17 06/09/18 Yes Alexis Frock, MD    Physical Exam: Vitals:   07/10/17 2200 07/10/17 2230 07/10/17 2250 07/10/17 2300  BP: 139/63 111/63  (!) 113/57  Pulse: 95 (!) 101  83  Resp: (!) 21 20  17   Temp:   100 F (37.8 C)   TempSrc:   Oral   SpO2: 95% 96%  96%  Weight:      Height:          Constitutional: NAD, calm, comfortable Vitals:   07/10/17 2200 07/10/17 2230 07/10/17  2250 07/10/17 2300  BP: 139/63 111/63  (!) 113/57  Pulse: 95 (!) 101  83  Resp: (!) 21 20  17   Temp:   100 F (37.8 C)   TempSrc:   Oral   SpO2: 95% 96%  96%  Weight:      Height:       Eyes: PERRL, lids and conjunctivae normal ENMT: Mucous membranes are moist. Posterior pharynx clear of any exudate or lesions.Normal dentition.  Neck: normal, supple, no masses, no thyromegaly Respiratory: rhonchi to auscultation bilaterally, no wheezing, no crackles. Normal respiratory effort. No accessory muscle use.  Cardiovascular: Regular rate and rhythm, no murmurs / rubs / gallops. No extremity edema. 2+ pedal pulses. No carotid bruits.  Abdomen: no tenderness, no masses palpated. No hepatosplenomegaly. Bowel sounds positive.  Musculoskeletal: no clubbing / cyanosis. No joint deformity upper and lower extremities. Good ROM, no contractures. Normal muscle tone.  Skin: no rashes, lesions, ulcers. No induration Neurologic: CN 2-12 grossly intact. Sensation intact, DTR normal. Strength 5/5 in all 4.  Psychiatric: normal mood no agitation oriented to person and place not to time  Labs on Admission: I have personally reviewed following labs and imaging studies  CBC:  Recent Labs Lab 07/10/17 2042  WBC 15.1*  NEUTROABS 13.2*  HGB 12.7*  HCT 41.4  MCV 94.5  PLT 027   Basic Metabolic Panel:  Recent Labs Lab 07/10/17 2042  NA 146*  K 3.2*  CL 112*  CO2 24  GLUCOSE 181*  BUN 20  CREATININE 0.85  CALCIUM 8.9    GFR: Estimated Creatinine Clearance: 73.4 mL/min (by C-G formula based on SCr of 0.85 mg/dL). Liver Function Tests:  Recent Labs Lab 07/10/17 2042  AST 19  ALT 18  ALKPHOS 77  BILITOT 0.7  PROT 7.3  ALBUMIN 3.7   Cardiac Enzymes:  Recent Labs Lab 07/10/17 2042  TROPONINI <0.03   Urine analysis:    Component Value Date/Time   COLORURINE YELLOW 07/10/2017 2135   APPEARANCEUR CLEAR 07/10/2017 2135   LABSPEC 1.018 07/10/2017 2135   PHURINE 6.0 07/10/2017 2135   GLUCOSEU NEGATIVE 07/10/2017 2135   HGBUR SMALL (A) 07/10/2017 2135   BILIRUBINUR NEGATIVE 07/10/2017 2135   KETONESUR NEGATIVE 07/10/2017 2135   PROTEINUR 30 (A) 07/10/2017 2135   UROBILINOGEN 0.2 10/23/2014 2359   NITRITE NEGATIVE 07/10/2017 2135   LEUKOCYTESUR NEGATIVE 07/10/2017 2135    Recent Results (from the past 240 hour(s))  Blood Culture (routine x 2)     Status: None (Preliminary result)   Collection Time: 07/10/17  8:42 PM  Result Value Ref Range Status   Specimen Description RIGHT ANTECUBITAL  Final   Special Requests   Final    BOTTLES DRAWN AEROBIC AND ANAEROBIC Blood Culture adequate volume   Culture PENDING  Incomplete   Report Status PENDING  Incomplete  Blood Culture (routine x 2)     Status: None (Preliminary result)   Collection Time: 07/10/17  8:50 PM  Result Value Ref Range Status   Specimen Description BLOOD LEFT HAND  Final   Special Requests   Final    BOTTLES DRAWN AEROBIC AND ANAEROBIC Blood Culture adequate volume   Culture PENDING  Incomplete   Report Status PENDING  Incomplete     Radiological Exams on Admission: Dg Chest Port 1 View  Result Date: 07/10/2017 CLINICAL DATA:  Cough and fever. EXAM: PORTABLE CHEST 1 VIEW COMPARISON:  Radiographs 05/13/2017 FINDINGS: Post median sternotomy. Left-sided pacemaker in place. Cardiomegaly with atherosclerotic thoracic aorta.  Progressive retrocardiac opacity from prior exam likely combination of pleural fluid and airspace  disease. Small right pleural effusion. Increased peribronchial thickening likely pulmonary edema. Lungs remain hyperinflated. No pneumothorax. IMPRESSION: 1. Cardiomegaly with peribronchial thickening favoring pulmonary edema. 2. Retrocardiac opacity likely combination of pleural fluid and airspace disease, atelectasis or pneumonia. Small right pleural effusion. Overall findings suggest CHF, concurrent left lower lobe pneumonia not excluded. Recommend continued radiographic follow-up to resolution, as retrocardiac opacity has been present on prior exams. Electronically Signed   By: Jeb Levering M.D.   On: 07/10/2017 21:15     Assessment/Plan 83 yo male with chills and fever appears to have pna  Principal Problem:   Fever- likely source lung, rhonchi on exam.  Opacity on cxr.  Place on vanc/levaquin/azactam as PCN allergic and recent rehab stay until cx data available.  Obtain sputum cx.  bc pending. bp stable.  Meets SIRS criteria.  Active Problems:   SIRS (systemic inflammatory response syndrome) (HCC)- as above, lactic acid nml, bp nml   Essential hypertension- noted, cont home meds   CAD, NATIVE VESSEL- stable   Atrial fibrillation ([place])- rate controlled at this time   Cardiac pacemaker in situ- noted   Med list pendign Pt seen before midnight  DVT prophylaxis:  scds Code Status:  full Family Communication:  none Disposition Plan:  Per day team Consults called:  none Admission status:  observation   Steadman Prosperi A MD [facility]  If 7PM-7AM, please contact night-coverage [email or web address] Password [facility]  07/10/2017, 11:37 PM

## 2017-07-10 NOTE — ED Triage Notes (Signed)
Fever, cough, congestion, leg swelling for a couple of days.

## 2017-07-10 NOTE — Progress Notes (Signed)
Pharmacy Antibiotic Note  Zachary Warren is a 83 y.o. male admitted on 07/10/2017 with sepsis.  Pharmacy has been consulted for Vancomycin, Azactam and Levaquin dosing.  Plan: Vancomycin 1500 mg IV, then Vancomycin 1 GM IV every 12 hours Levaquin 750 mg every 24 hours Azactam 2 GM, then Azactam 1 GM IV every 8 hours  Height: 5\' 9"  (175.3 cm) Weight: 193 lb (87.5 kg) IBW/kg (Calculated) : 70.7  Temp (24hrs), Avg:102.6 F (39.2 C), Min:102.6 F (39.2 C), Max:102.6 F (39.2 C)  No results for input(s): WBC, CREATININE, LATICACIDVEN, VANCOTROUGH, VANCOPEAK, VANCORANDOM, GENTTROUGH, GENTPEAK, GENTRANDOM, TOBRATROUGH, TOBRAPEAK, TOBRARND, AMIKACINPEAK, AMIKACINTROU, AMIKACIN in the last 168 hours.  CrCl cannot be calculated (Patient's most recent lab result is older than the maximum 21 days allowed.).    Allergies  Allergen Reactions  . Aricept [Donepezil Hcl] Other (See Comments)    "stomach pain", generic  . Ceclor [Cefaclor]   . Namenda [Memantine Hcl] Other (See Comments)    "dizzy", brand medication  . Penicillins     Documented on MAR as "PCN"  . Ramipril Other (See Comments)     cough    Antimicrobials this admission: Vancomycin  9/29 >>  Azactam 9/29 >>  Levaquin 9/29>>   Thank you for allowing pharmacy to be a part of this patient's care.  Chriss Czar 07/10/2017 8:54 PM

## 2017-07-11 ENCOUNTER — Encounter (HOSPITAL_COMMUNITY): Payer: Self-pay | Admitting: *Deleted

## 2017-07-11 DIAGNOSIS — Z950 Presence of cardiac pacemaker: Secondary | ICD-10-CM

## 2017-07-11 DIAGNOSIS — I509 Heart failure, unspecified: Secondary | ICD-10-CM

## 2017-07-11 DIAGNOSIS — I10 Essential (primary) hypertension: Secondary | ICD-10-CM | POA: Diagnosis not present

## 2017-07-11 DIAGNOSIS — K219 Gastro-esophageal reflux disease without esophagitis: Secondary | ICD-10-CM | POA: Diagnosis not present

## 2017-07-11 DIAGNOSIS — J189 Pneumonia, unspecified organism: Secondary | ICD-10-CM | POA: Diagnosis present

## 2017-07-11 DIAGNOSIS — J181 Lobar pneumonia, unspecified organism: Secondary | ICD-10-CM | POA: Diagnosis not present

## 2017-07-11 DIAGNOSIS — I482 Chronic atrial fibrillation: Secondary | ICD-10-CM

## 2017-07-11 DIAGNOSIS — R509 Fever, unspecified: Secondary | ICD-10-CM | POA: Diagnosis not present

## 2017-07-11 DIAGNOSIS — I69391 Dysphagia following cerebral infarction: Secondary | ICD-10-CM

## 2017-07-11 LAB — CBC WITH DIFFERENTIAL/PLATELET
BASOS ABS: 0 10*3/uL (ref 0.0–0.1)
BASOS PCT: 0 %
EOS ABS: 0 10*3/uL (ref 0.0–0.7)
Eosinophils Relative: 0 %
HCT: 36.7 % — ABNORMAL LOW (ref 39.0–52.0)
Hemoglobin: 11.5 g/dL — ABNORMAL LOW (ref 13.0–17.0)
Lymphocytes Relative: 9 %
Lymphs Abs: 1.3 10*3/uL (ref 0.7–4.0)
MCH: 29.5 pg (ref 26.0–34.0)
MCHC: 31.3 g/dL (ref 30.0–36.0)
MCV: 94.1 fL (ref 78.0–100.0)
MONO ABS: 1.5 10*3/uL — AB (ref 0.1–1.0)
MONOS PCT: 11 %
NEUTROS ABS: 11.2 10*3/uL — AB (ref 1.7–7.7)
NEUTROS PCT: 80 %
Platelets: 219 10*3/uL (ref 150–400)
RBC: 3.9 MIL/uL — ABNORMAL LOW (ref 4.22–5.81)
RDW: 13.6 % (ref 11.5–15.5)
WBC: 13.9 10*3/uL — ABNORMAL HIGH (ref 4.0–10.5)

## 2017-07-11 LAB — BASIC METABOLIC PANEL
Anion gap: 7 (ref 5–15)
BUN: 16 mg/dL (ref 6–20)
CHLORIDE: 111 mmol/L (ref 101–111)
CO2: 25 mmol/L (ref 22–32)
Calcium: 8 mg/dL — ABNORMAL LOW (ref 8.9–10.3)
Creatinine, Ser: 0.75 mg/dL (ref 0.61–1.24)
GFR calc non Af Amer: >60 mL/min (ref >60)
GLUCOSE: 118 mg/dL — AB (ref 65–99)
Potassium: 2.8 mmol/L — ABNORMAL LOW (ref 3.5–5.1)
Sodium: 143 mmol/L (ref 135–145)

## 2017-07-11 LAB — EXPECTORATED SPUTUM ASSESSMENT W GRAM STAIN, RFLX TO RESP C

## 2017-07-11 LAB — EXPECTORATED SPUTUM ASSESSMENT W REFEX TO RESP CULTURE

## 2017-07-11 LAB — MAGNESIUM: MAGNESIUM: 1.8 mg/dL (ref 1.7–2.4)

## 2017-07-11 LAB — STREP PNEUMONIAE URINARY ANTIGEN: STREP PNEUMO URINARY ANTIGEN: NEGATIVE

## 2017-07-11 MED ORDER — FUROSEMIDE 10 MG/ML IJ SOLN
20.0000 mg | Freq: Once | INTRAMUSCULAR | Status: AC
  Administered 2017-07-11: 20 mg via INTRAVENOUS
  Filled 2017-07-11: qty 2

## 2017-07-11 MED ORDER — MOMETASONE FURO-FORMOTEROL FUM 100-5 MCG/ACT IN AERO
2.0000 | INHALATION_SPRAY | Freq: Two times a day (BID) | RESPIRATORY_TRACT | Status: DC
  Administered 2017-07-11 – 2017-07-13 (×5): 2 via RESPIRATORY_TRACT
  Filled 2017-07-11: qty 8.8

## 2017-07-11 MED ORDER — IPRATROPIUM-ALBUTEROL 0.5-2.5 (3) MG/3ML IN SOLN
3.0000 mL | Freq: Four times a day (QID) | RESPIRATORY_TRACT | Status: DC
  Administered 2017-07-11 – 2017-07-13 (×10): 3 mL via RESPIRATORY_TRACT
  Filled 2017-07-11 (×10): qty 3

## 2017-07-11 MED ORDER — ATORVASTATIN CALCIUM 40 MG PO TABS
40.0000 mg | ORAL_TABLET | Freq: Every day | ORAL | Status: DC
  Administered 2017-07-11 – 2017-07-12 (×2): 40 mg via ORAL
  Filled 2017-07-11 (×2): qty 1

## 2017-07-11 MED ORDER — FUROSEMIDE 20 MG PO TABS
20.0000 mg | ORAL_TABLET | Freq: Every day | ORAL | Status: DC
  Administered 2017-07-12: 20 mg via ORAL
  Filled 2017-07-11: qty 1

## 2017-07-11 MED ORDER — POTASSIUM CHLORIDE 20 MEQ/15ML (10%) PO SOLN
40.0000 meq | Freq: Two times a day (BID) | ORAL | Status: DC
  Administered 2017-07-11 (×2): 40 meq via ORAL
  Filled 2017-07-11 (×2): qty 30

## 2017-07-11 MED ORDER — PANTOPRAZOLE SODIUM 40 MG PO TBEC
40.0000 mg | DELAYED_RELEASE_TABLET | Freq: Two times a day (BID) | ORAL | Status: DC
  Administered 2017-07-11 – 2017-07-13 (×5): 40 mg via ORAL
  Filled 2017-07-11 (×5): qty 1

## 2017-07-11 NOTE — Progress Notes (Signed)
PROGRESS NOTE    Zachary Warren  ZWC:[id number]  DOB: 1932-12-15  DOA: 07/10/2017 PCP: Wenda Low, MD   Brief Admission Hx: Zachary Warren is a 83 y.o. male with medical history significant of recent d/c from rehab on Thursday went home with wife and has been having chills since Friday (today is sat).  Pt reports coughing for several days.  He was admitted with healthcare associated pneumonia.  MDM/Assessment & Plan:   1. Healthcare associated pneumonia of lower lobe-continue broad-spectrum antibiotics pending cultures. Hopefully can de-escalate antibiotics tomorrow. Patient clinically is showing some improvement. Continue supportive therapy. Follow cultures. Repeat portable chest x-ray in the morning. 2. Congestive heart failure acute on chronic systolic with pulmonary edema-IV Lasix ordered. The patient is diuresing. 3. Hypokalemia-replacing orally. Check magnesium. Follow BMP. Telemetry. 4. Sepsis-secondary to pneumonia-continue supportive therapy. 5. Atrial fibrillation-chronic, resuming home medications. Rate is currently controlled. Cardiac pacemaker in situ. 6. Essential hypertension-resume home medications. 7. Dementia-supportive therapy. Wife is in the room most of the day. 8. Chronic debility-PT evaluation. Patient was just released from acute rehabilitation. 9. Anemia, unspecified-monitor hemoglobin.  10. GERD - Protonix ordered  DVT prophylaxis: SCDs Code Status: FULL  Family Communication: wife at bedside Disposition Plan: TBD  Subjective: Pt pleasantly demented.   Objective: Vitals:   07/10/17 2250 07/10/17 2300 07/10/17 2356 07/11/17 0157  BP:  (!) 113/57 (!) 142/71 127/86  Pulse:  83  91  Resp:  17 18   Temp: 100 F (37.8 C)  98.7 F (37.1 C)   TempSrc: Oral  Oral   SpO2:  96% 93%   Weight:      Height:        Intake/Output Summary (Last 24 hours) at 07/11/17 0708 Last data filed at 07/11/17 0200  Gross per 24 hour  Intake                0 ml    Output              425 ml  Net             -425 ml   Filed Weights   07/10/17 2033  Weight: 87.5 kg (193 lb)    REVIEW OF SYSTEMS  As per history otherwise all reviewed and reported negative  Exam:  General exam: awake, alert, confused, NAD Respiratory system: coarse rales heard, crackles heard.  No increased work of breathing. Cardiovascular system: S1 & S2 heard, RRR. No JVD, murmurs, gallops, clicks or pedal edema. Gastrointestinal system: Abdomen is nondistended, soft and nontender. Normal bowel sounds heard. Central nervous system: Alert and oriented. No focal neurological deficits. Extremities: trace pretibial edema BLEs.  Data Reviewed: Basic Metabolic Panel:  Recent Labs Lab 07/10/17 2042 07/11/17 0512  NA 146* 143  K 3.2* 2.8*  CL 112* 111  CO2 24 25  GLUCOSE 181* 118*  BUN 20 16  CREATININE 0.85 0.75  CALCIUM 8.9 8.0*   Liver Function Tests:  Recent Labs Lab 07/10/17 2042  AST 19  ALT 18  ALKPHOS 77  BILITOT 0.7  PROT 7.3  ALBUMIN 3.7   No results for input(s): LIPASE, AMYLASE in the last 168 hours. No results for input(s): AMMONIA in the last 168 hours. CBC:  Recent Labs Lab 07/10/17 2042 07/11/17 0512  WBC 15.1* 13.9*  NEUTROABS 13.2* 11.2*  HGB 12.7* 11.5*  HCT 41.4 36.7*  MCV 94.5 94.1  PLT 265 219   Cardiac Enzymes:  Recent Labs Lab 07/10/17 2042  TROPONINI <  0.03   CBG (last 3)  No results for input(s): GLUCAP in the last 72 hours. Recent Results (from the past 240 hour(s))  Blood Culture (routine x 2)     Status: None (Preliminary result)   Collection Time: 07/10/17  8:42 PM  Result Value Ref Range Status   Specimen Description RIGHT ANTECUBITAL  Final   Special Requests   Final    BOTTLES DRAWN AEROBIC AND ANAEROBIC Blood Culture adequate volume   Culture NO GROWTH < 12 HOURS  Final   Report Status PENDING  Incomplete  Blood Culture (routine x 2)     Status: None (Preliminary result)   Collection Time: 07/10/17   8:50 PM  Result Value Ref Range Status   Specimen Description BLOOD LEFT HAND  Final   Special Requests   Final    BOTTLES DRAWN AEROBIC AND ANAEROBIC Blood Culture adequate volume   Culture NO GROWTH < 12 HOURS  Final   Report Status PENDING  Incomplete     Studies: Dg Chest Port 1 View  Result Date: 07/10/2017 CLINICAL DATA:  Cough and fever. EXAM: PORTABLE CHEST 1 VIEW COMPARISON:  Radiographs 05/13/2017 FINDINGS: Post median sternotomy. Left-sided pacemaker in place. Cardiomegaly with atherosclerotic thoracic aorta. Progressive retrocardiac opacity from prior exam likely combination of pleural fluid and airspace disease. Small right pleural effusion. Increased peribronchial thickening likely pulmonary edema. Lungs remain hyperinflated. No pneumothorax. IMPRESSION: 1. Cardiomegaly with peribronchial thickening favoring pulmonary edema. 2. Retrocardiac opacity likely combination of pleural fluid and airspace disease, atelectasis or pneumonia. Small right pleural effusion. Overall findings suggest CHF, concurrent left lower lobe pneumonia not excluded. Recommend continued radiographic follow-up to resolution, as retrocardiac opacity has been present on prior exams. Electronically Signed   By: Jeb Levering M.D.   On: 07/10/2017 21:15   Scheduled Meds: . aspirin EC  81 mg Oral Daily  . atorvastatin  40 mg Oral q1800  . calcium carbonate  1 tablet Oral BID  . cholecalciferol  1,000 Units Oral Daily  . cholecalciferol  400 Units Oral BID  . diltiazem  180 mg Oral BID  . FLUoxetine  40 mg Oral Daily  . furosemide  20 mg Intravenous Once  . [START ON 07/12/2017] furosemide  20 mg Oral Daily  . guaiFENesin  600 mg Oral BID  . metoprolol tartrate  12.5 mg Oral BID  . mometasone-formoterol  2 puff Inhalation BID  . multivitamin with minerals  1 tablet Oral Daily  . omeprazole  20 mg Oral BID  . potassium chloride  40 mEq Oral BID  . sodium chloride flush  3 mL Intravenous Q12H  . topiramate   50 mg Oral BID   Continuous Infusions: . sodium chloride    . aztreonam 1 g (07/11/17 0622)  . levofloxacin (LEVAQUIN) IV    . vancomycin      Principal Problem:   LLL pneumonia (HCC) Active Problems:   Fever   Hyperlipidemia   Essential hypertension   CAD, NATIVE VESSEL   Atrial fibrillation (HCC)   PVD   Dysphagia as late effect of stroke   GERD (gastroesophageal reflux disease)   Cardiac pacemaker in situ   MCI (mild cognitive impairment) with memory loss   Sepsis ([place])   Hypokalemia   HCAP (healthcare-associated pneumonia)   Time spent:   Irwin Brakeman, MD, FAAFP [facility] Pager [phone]  If 7PM-7AM, please contact night-coverage [email or web address] Password [email or web address] 07/11/2017, 7:08 AM    LOS: 0 days

## 2017-07-11 NOTE — Progress Notes (Signed)
Patient arrived to unit via stretcher with wife. Patient calm and pleasant. Patient was sent home from rehab on Thursday with persistent cough and got worse since Friday per wife. Patient put on cardiac monitor, 2LNC, urine was collected. Patient now resting with bed alarm on.

## 2017-07-12 ENCOUNTER — Observation Stay (HOSPITAL_BASED_OUTPATIENT_CLINIC_OR_DEPARTMENT_OTHER): Payer: Medicare Other

## 2017-07-12 ENCOUNTER — Observation Stay (HOSPITAL_COMMUNITY): Payer: Medicare Other

## 2017-07-12 DIAGNOSIS — E876 Hypokalemia: Secondary | ICD-10-CM | POA: Diagnosis not present

## 2017-07-12 DIAGNOSIS — A419 Sepsis, unspecified organism: Principal | ICD-10-CM

## 2017-07-12 DIAGNOSIS — Z950 Presence of cardiac pacemaker: Secondary | ICD-10-CM | POA: Diagnosis not present

## 2017-07-12 DIAGNOSIS — J181 Lobar pneumonia, unspecified organism: Secondary | ICD-10-CM | POA: Diagnosis not present

## 2017-07-12 DIAGNOSIS — K219 Gastro-esophageal reflux disease without esophagitis: Secondary | ICD-10-CM | POA: Diagnosis not present

## 2017-07-12 DIAGNOSIS — I361 Nonrheumatic tricuspid (valve) insufficiency: Secondary | ICD-10-CM

## 2017-07-12 DIAGNOSIS — I482 Chronic atrial fibrillation: Secondary | ICD-10-CM | POA: Diagnosis not present

## 2017-07-12 DIAGNOSIS — I340 Nonrheumatic mitral (valve) insufficiency: Secondary | ICD-10-CM | POA: Diagnosis not present

## 2017-07-12 DIAGNOSIS — J189 Pneumonia, unspecified organism: Secondary | ICD-10-CM | POA: Diagnosis not present

## 2017-07-12 DIAGNOSIS — I69391 Dysphagia following cerebral infarction: Secondary | ICD-10-CM | POA: Diagnosis not present

## 2017-07-12 DIAGNOSIS — J90 Pleural effusion, not elsewhere classified: Secondary | ICD-10-CM | POA: Diagnosis not present

## 2017-07-12 DIAGNOSIS — I10 Essential (primary) hypertension: Secondary | ICD-10-CM | POA: Diagnosis not present

## 2017-07-12 DIAGNOSIS — I2510 Atherosclerotic heart disease of native coronary artery without angina pectoris: Secondary | ICD-10-CM

## 2017-07-12 DIAGNOSIS — R509 Fever, unspecified: Secondary | ICD-10-CM | POA: Diagnosis not present

## 2017-07-12 LAB — URINE CULTURE: Culture: NO GROWTH

## 2017-07-12 LAB — CBC WITH DIFFERENTIAL/PLATELET
Basophils Absolute: 0 10*3/uL (ref 0.0–0.1)
Basophils Relative: 0 %
EOS ABS: 0 10*3/uL (ref 0.0–0.7)
EOS PCT: 0 %
HCT: 37 % — ABNORMAL LOW (ref 39.0–52.0)
Hemoglobin: 11.4 g/dL — ABNORMAL LOW (ref 13.0–17.0)
Lymphocytes Relative: 8 %
Lymphs Abs: 0.9 10*3/uL (ref 0.7–4.0)
MCH: 29.3 pg (ref 26.0–34.0)
MCHC: 30.8 g/dL (ref 30.0–36.0)
MCV: 95.1 fL (ref 78.0–100.0)
MONOS PCT: 11 %
Monocytes Absolute: 1.2 10*3/uL — ABNORMAL HIGH (ref 0.1–1.0)
Neutro Abs: 8.8 10*3/uL — ABNORMAL HIGH (ref 1.7–7.7)
Neutrophils Relative %: 81 %
PLATELETS: 231 10*3/uL (ref 150–400)
RBC: 3.89 MIL/uL — ABNORMAL LOW (ref 4.22–5.81)
RDW: 13.7 % (ref 11.5–15.5)
WBC: 10.9 10*3/uL — ABNORMAL HIGH (ref 4.0–10.5)

## 2017-07-12 LAB — COMPREHENSIVE METABOLIC PANEL
ALBUMIN: 3 g/dL — AB (ref 3.5–5.0)
ALT: 14 U/L — ABNORMAL LOW (ref 17–63)
AST: 16 U/L (ref 15–41)
Alkaline Phosphatase: 71 U/L (ref 38–126)
Anion gap: 9 (ref 5–15)
BUN: 20 mg/dL (ref 6–20)
CHLORIDE: 106 mmol/L (ref 101–111)
CO2: 26 mmol/L (ref 22–32)
Calcium: 8.2 mg/dL — ABNORMAL LOW (ref 8.9–10.3)
Creatinine, Ser: 0.91 mg/dL (ref 0.61–1.24)
GFR calc Af Amer: >60 mL/min (ref >60)
GFR calc non Af Amer: >60 mL/min (ref >60)
GLUCOSE: 107 mg/dL — AB (ref 65–99)
POTASSIUM: 3.2 mmol/L — AB (ref 3.5–5.1)
Sodium: 141 mmol/L (ref 135–145)
Total Bilirubin: 0.8 mg/dL (ref 0.3–1.2)
Total Protein: 6.2 g/dL — ABNORMAL LOW (ref 6.5–8.1)

## 2017-07-12 LAB — ECHOCARDIOGRAM COMPLETE
AVLVOTPG: 5 mmHg
CHL CUP DOP CALC LVOT VTI: 21.3 cm
CHL CUP MV DEC (S): 162
CHL CUP RV SYS PRESS: 55 mmHg
EWDT: 162 ms
FS: 40 % (ref 28–44)
Height: 69 in
IV/PV OW: 0.88
LA ID, A-P, ES: 61 mm
LA diam end sys: 61 mm
LA vol A4C: 112 ml
LA vol index: 54.2 mL/m2
LA vol: 113 mL
LADIAMINDEX: 2.93 cm/m2
LDCA: 2.54 cm2
LVOT peak vel: 111 cm/s
LVOTD: 18 mm
LVOTSV: 54 mL
MV Peak grad: 9 mmHg
MV pk E vel: 147 m/s
PW: 11.9 mm — AB (ref 0.6–1.1)
RV TAPSE: 11.5 mm
Reg peak vel: 318 cm/s
TRMAXVEL: 318 cm/s
Weight: 3088 oz

## 2017-07-12 LAB — LEGIONELLA PNEUMOPHILA SEROGP 1 UR AG: L. pneumophila Serogp 1 Ur Ag: NEGATIVE

## 2017-07-12 LAB — MAGNESIUM: MAGNESIUM: 1.7 mg/dL (ref 1.7–2.4)

## 2017-07-12 MED ORDER — PERFLUTREN LIPID MICROSPHERE
1.0000 mL | INTRAVENOUS | Status: AC | PRN
  Administered 2017-07-12: 1 mL via INTRAVENOUS
  Administered 2017-07-12: 2 mL via INTRAVENOUS
  Administered 2017-07-12: 1 mL via INTRAVENOUS
  Filled 2017-07-12: qty 10

## 2017-07-12 MED ORDER — QUETIAPINE FUMARATE 25 MG PO TABS
25.0000 mg | ORAL_TABLET | Freq: Every evening | ORAL | Status: DC | PRN
  Administered 2017-07-12: 25 mg via ORAL
  Filled 2017-07-12: qty 1

## 2017-07-12 MED ORDER — POTASSIUM CHLORIDE 20 MEQ/15ML (10%) PO SOLN
40.0000 meq | Freq: Three times a day (TID) | ORAL | Status: AC
  Administered 2017-07-12 (×3): 40 meq via ORAL
  Filled 2017-07-12 (×3): qty 30

## 2017-07-12 MED ORDER — FUROSEMIDE 10 MG/ML IJ SOLN
60.0000 mg | Freq: Once | INTRAMUSCULAR | Status: AC
  Administered 2017-07-12: 60 mg via INTRAVENOUS
  Filled 2017-07-12: qty 6

## 2017-07-12 MED ORDER — FUROSEMIDE 40 MG PO TABS
40.0000 mg | ORAL_TABLET | Freq: Every day | ORAL | Status: DC
  Administered 2017-07-13: 40 mg via ORAL
  Filled 2017-07-12: qty 1

## 2017-07-12 NOTE — Care Management Obs Status (Signed)
[place] NOTIFICATION   Patient Details  Name: ZEBEDEE SEGUNDO MRN: [id number] Date of Birth: 05-31-1933   Medicare Observation Status Notification Given:  Yes    Kuba Shepherd, Chauncey Reading, RN 07/12/2017, 9:25 AM

## 2017-07-12 NOTE — Care Management Note (Signed)
Case Management Note  Patient Details  Name: Zachary Warren MRN: [id number] Date of Birth: [birth year]  Subjective/Objective:   Adm with pneumonia. From home with wife. Recent stay at [facility], home health with [facility] arranged, but hasn't started services. Patient also has CNA help paid for OOP by family 7 days a week/5 hours a day.  He has RW and Cane at home if needed. Acutely on oxygen.               Action/Plan: Anticipate DC home with resumption of home health orders.   Expected Discharge Date:       07/14/2017           Expected Discharge Plan:  [facility]  In-House Referral:     Discharge planning Services  CM Consult  Post Acute Care Choice:  Home Health, Resumption of Svcs/PTA Provider Choice offered to:  Patient, Adult Children  DME Arranged:    DME Agency:     HH Arranged:  RN, PT [place] Agency:  [facility]  Status of Service:  In process, will continue to follow  If discussed at Long Length of Stay Meetings, dates discussed:    Additional Comments:  Cruze Zingaro, Chauncey Reading, RN 07/12/2017, 10:16 AM

## 2017-07-12 NOTE — Evaluation (Signed)
Physical Therapy Evaluation Patient Details Name: Zachary Warren MRN: [id number] DOB: 27-Oct-1932 Today's Date: 07/12/2017   History of Present Illness  Zachary Warren is a 83 y.o. male with medical history significant of recent d/c from rehab on Thursday went home with wife and has been having chills since Friday (today is sat).  Pt reports coughing for several days.  He is pleasantly easily confused.  He deneis any n/v/d.  No pain anywhere.  He did not know he was having a fever until he got to ED. Temp over 102.  Denies any sob.  But with productive cough.  Found to have pna and referred for admission for treatment of such.    Clinical Impression  Patient limited for functional mobility as stated below secondary to BLE weakness, fatigue and poor standing balance.  Patient will benefit from continued physical therapy in hospital and recommended venue below to increase strength, balance, endurance for safe ADLs and gait.    Follow Up Recommendations Home health PT;Supervision/Assistance - 24 hour    Equipment Recommendations  Rolling walker with 5" wheels    Recommendations for Other Services       Precautions / Restrictions Precautions Precautions: Fall Restrictions Weight Bearing Restrictions: No      Mobility  Bed Mobility Overal bed mobility: Needs Assistance Bed Mobility: Supine to Sit;Sit to Supine     Supine to sit: Min guard Sit to supine: Min guard      Transfers Overall transfer level: Needs assistance Equipment used: Rolling walker (2 wheeled) Transfers: Sit to/from [facility] Sit to Stand: Supervision Stand pivot transfers: Min guard          Ambulation/Gait Ambulation/Gait assistance: Min guard Ambulation Distance (Feet): 80 Feet Assistive device: Rolling walker (2 wheeled) Gait Pattern/deviations: Decreased stride length   Gait velocity interpretation: Below normal speed for age/gender General Gait Details: slighlty labored  slower than normal cadence without loss of balance  Stairs            Wheelchair Mobility    Modified Rankin (Stroke Patients Only)       Balance Overall balance assessment: Needs assistance Sitting-balance support: Feet supported;No upper extremity supported Sitting balance-Leahy Scale: Good     Standing balance support: Bilateral upper extremity supported;During functional activity Standing balance-Leahy Scale: Fair                               Pertinent Vitals/Pain Pain Assessment: No/denies pain    Home Living Family/patient expects to be discharged to:: Private residence Living Arrangements: Spouse/significant other Available Help at Discharge: Family;Available 24 hours/day Type of Home: House Home Access: Stairs to enter Entrance Stairs-Rails: Right Entrance Stairs-Number of Steps: 5 Home Layout: One level Home Equipment: Walker - standard;Shower seat      Prior Function Level of Independence: Independent               Hand Dominance        Extremity/Trunk Assessment   Upper Extremity Assessment Upper Extremity Assessment: Generalized weakness    Lower Extremity Assessment Lower Extremity Assessment: Generalized weakness    Cervical / Trunk Assessment Cervical / Trunk Assessment: Normal  Communication   Communication: No difficulties  Cognition Arousal/Alertness: Awake/alert Behavior During Therapy: WFL for tasks assessed/performed Overall Cognitive Status: Within Functional Limits for tasks assessed  General Comments      Exercises     Assessment/Plan    PT Assessment Patient needs continued PT services  PT Problem List Decreased activity tolerance;Decreased balance;Decreased mobility;Decreased strength       PT Treatment Interventions Gait training;Stair training;Functional mobility training;Therapeutic activities;Therapeutic exercise;Patient/family  education    PT Goals (Current goals can be found in the Care Plan section)  Acute Rehab PT Goals Patient Stated Goal: return home with family to assist PT Goal Formulation: With patient/family Time For Goal Achievement: 07/16/17 Potential to Achieve Goals: Good    Frequency Min 3X/week   Barriers to discharge        Co-evaluation               AM-PAC PT "6 Clicks" Daily Activity  Outcome Measure Difficulty turning over in bed (including adjusting bedclothes, sheets and blankets)?: A Little Difficulty moving from lying on back to sitting on the side of the bed? : A Little Difficulty sitting down on and standing up from a chair with arms (e.g., wheelchair, bedside commode, etc,.)?: A Little Help needed moving to and from a bed to chair (including a wheelchair)?: A Little Help needed walking in hospital room?: A Little Help needed climbing 3-5 steps with a railing? : A Little 6 Click Score: 18    End of Session Equipment Utilized During Treatment: Gait belt Activity Tolerance: Patient tolerated treatment well Patient left: with call bell/phone within reach;with family/visitor present (seated at bedside with his daughter supervising) Nurse Communication: Mobility status PT Visit Diagnosis: Unsteadiness on feet (R26.81);Other abnormalities of gait and mobility (R26.89);Muscle weakness (generalized) (M62.81)    Time: 0881-1031 PT Time Calculation (min) (ACUTE ONLY): 32 min   Charges:   PT Evaluation $PT Eval Low Complexity: 1 Low PT Treatments $Therapeutic Activity: 23-37 mins   PT G Codes:   PT G-Codes **NOT FOR INPATIENT CLASS** Functional Assessment Tool Used: AM-PAC 6 Clicks Basic Mobility Functional Limitation: Mobility: Walking and moving around Mobility: Walking and Moving Around Current Status (R9458): At least 40 percent but less than 60 percent impaired, limited or restricted Mobility: Walking and Moving Around Goal Status [phone]): At least 40 percent but  less than 60 percent impaired, limited or restricted Mobility: Walking and Moving Around Discharge Status [phone]): At least 40 percent but less than 60 percent impaired, limited or restricted    3:58 PM, 07/12/17 Lonell Grandchild, MPT Physical Therapist with [facility] 336 [phone] office [phone] mobile phone

## 2017-07-12 NOTE — Progress Notes (Signed)
PROGRESS NOTE    Zachary Warren  EQA:[id number]  DOB: 12-Nov-1932  DOA: 07/10/2017 PCP: Wenda Low, MD  Brief Admission Hx: Zachary Warren is a 83 y.o. male with medical history significant of recent d/c from rehab on Thursday went home with wife and has been having chills since Friday (today is sat).  Pt reports coughing for several days.  He was admitted with healthcare associated pneumonia.  MDM/Assessment & Plan:   1. Healthcare associated pneumonia of lower lobe-continue broad-spectrum antibiotics pending cultures. DC vancomycin today. Patient clinically is showing some improvement. Continue supportive therapy. Follow cultures.  2. Congestive heart failure acute on chronic systolic with pulmonary edema-Give additional 60 mg IV lasix today.  Monitor I/O, weights.  Get another echo.  3. Hypokalemia-replacing orally. Check magnesium. Follow BMP.  4. Sepsis-secondary to pneumonia-continue supportive therapy. 5. Atrial fibrillation-chronic, resuming home medications. Rate is currently controlled. Cardiac pacemaker in situ. 6. Essential hypertension-resume home medications. 7. Dementia-supportive therapy. Wife is in the room most of the day. 8. Chronic debility-PT evaluation. Patient was just released from acute rehabilitation. 9. Anemia, unspecified-monitor hemoglobin.  10. GERD - Protonix ordered  DVT prophylaxis: SCDs Code Status: FULL  Family Communication: wife at bedside Disposition Plan: TBD  Subjective: Pt pleasantly demented. He has no complaints today.   Objective: Vitals:   07/12/17 0136 07/12/17 0508 07/12/17 0802 07/12/17 0808  BP:  117/63    Pulse:  92    Resp:  18    Temp:  98.2 F (36.8 C)    TempSrc:  Oral    SpO2: 93% 96% 95% 100%  Weight:      Height:        Intake/Output Summary (Last 24 hours) at 07/12/17 0953 Last data filed at 07/11/17 1750  Gross per 24 hour  Intake              780 ml  Output              550 ml  Net              230 ml    Filed Weights   07/10/17 2033  Weight: 87.5 kg (193 lb)    REVIEW OF SYSTEMS  As per history otherwise all reviewed and reported negative  Exam:  General exam: awake, alert, confused, NAD Respiratory system: coarse rales heard, crackles heard.  No increased work of breathing. Cardiovascular system: S1 & S2 heard, RRR. No JVD, murmurs, gallops, clicks or pedal edema. Gastrointestinal system: Abdomen is nondistended, soft and nontender. Normal bowel sounds heard. Central nervous system: Alert and oriented. No focal neurological deficits. Extremities: trace pretibial edema BLEs.  Data Reviewed: Basic Metabolic Panel:  Recent Labs Lab 07/10/17 2042 07/11/17 0512 07/12/17 0409  NA 146* 143 141  K 3.2* 2.8* 3.2*  CL 112* 111 106  CO2 24 25 26   GLUCOSE 181* 118* 107*  BUN 20 16 20   CREATININE 0.85 0.75 0.91  CALCIUM 8.9 8.0* 8.2*  MG  --  1.8 1.7   Liver Function Tests:  Recent Labs Lab 07/10/17 2042 07/12/17 0409  AST 19 16  ALT 18 14*  ALKPHOS 77 71  BILITOT 0.7 0.8  PROT 7.3 6.2*  ALBUMIN 3.7 3.0*   No results for input(s): LIPASE, AMYLASE in the last 168 hours. No results for input(s): AMMONIA in the last 168 hours. CBC:  Recent Labs Lab 07/10/17 2042 07/11/17 0512 07/12/17 0409  WBC 15.1* 13.9* 10.9*  NEUTROABS 13.2* 11.2* 8.8*  HGB 12.7* 11.5* 11.4*  HCT 41.4 36.7* 37.0*  MCV 94.5 94.1 95.1  PLT 265 219 231   Cardiac Enzymes:  Recent Labs Lab 07/10/17 2042  TROPONINI <0.03   CBG (last 3)  No results for input(s): GLUCAP in the last 72 hours. Recent Results (from the past 240 hour(s))  Blood Culture (routine x 2)     Status: None (Preliminary result)   Collection Time: 07/10/17  8:42 PM  Result Value Ref Range Status   Specimen Description RIGHT ANTECUBITAL  Final   Special Requests   Final    BOTTLES DRAWN AEROBIC AND ANAEROBIC Blood Culture adequate volume   Culture NO GROWTH 2 DAYS  Final   Report Status PENDING  Incomplete    Blood Culture (routine x 2)     Status: None (Preliminary result)   Collection Time: 07/10/17  8:50 PM  Result Value Ref Range Status   Specimen Description BLOOD LEFT HAND  Final   Special Requests   Final    BOTTLES DRAWN AEROBIC AND ANAEROBIC Blood Culture adequate volume   Culture NO GROWTH 2 DAYS  Final   Report Status PENDING  Incomplete  Culture, sputum-assessment     Status: None   Collection Time: 07/11/17 11:46 AM  Result Value Ref Range Status   Specimen Description EXPECTORATED SPUTUM  Final   Special Requests NONE  Final   Sputum evaluation   Final    THIS SPECIMEN IS ACCEPTABLE FOR SPUTUM CULTURE Performed at [facility]    Report Status 07/11/2017 FINAL  Final  Culture, respiratory (NON-Expectorated)     Status: None (Preliminary result)   Collection Time: 07/11/17 11:46 AM  Result Value Ref Range Status   Specimen Description EXPECTORATED SPUTUM  Final   Special Requests NONE  Final   Gram Stain   Final    NO WBC SEEN RARE SQUAMOUS EPITHELIAL CELLS PRESENT ABUNDANT GRAM POSITIVE COCCI IN PAIRS IN CLUSTERS FEW GRAM NEGATIVE COCCI IN PAIRS FEW GRAM NEGATIVE RODS FEW GRAM POSITIVE RODS RARE BUDDING YEAST SEEN Performed at [facility], [place] [address] [place], [place] [address]    Culture PENDING  Incomplete   Report Status PENDING  Incomplete     Studies: Dg Chest Port 1 View  Result Date: 07/12/2017 CLINICAL DATA:  Left lower lobe pneumonia EXAM: PORTABLE CHEST 1 VIEW COMPARISON:  Two days ago FINDINGS: Haziness of the left more than right bases consistent with pleural fluid right atelectasis. There is history of left lower lobe pneumonia which would be obscured. Vascular congestion/ edema. Cardiomegaly with dual-chamber implant. Previous median sternotomy with history of CABG. No evidence of pneumothorax. IMPRESSION: Progressive pleural effusions and vascular congestion/ edema. Electronically Signed   By: Monte Fantasia M.D.   On: 07/12/2017  07:33   Dg Chest Port 1 View  Result Date: 07/10/2017 CLINICAL DATA:  Cough and fever. EXAM: PORTABLE CHEST 1 VIEW COMPARISON:  Radiographs 05/13/2017 FINDINGS: Post median sternotomy. Left-sided pacemaker in place. Cardiomegaly with atherosclerotic thoracic aorta. Progressive retrocardiac opacity from prior exam likely combination of pleural fluid and airspace disease. Small right pleural effusion. Increased peribronchial thickening likely pulmonary edema. Lungs remain hyperinflated. No pneumothorax. IMPRESSION: 1. Cardiomegaly with peribronchial thickening favoring pulmonary edema. 2. Retrocardiac opacity likely combination of pleural fluid and airspace disease, atelectasis or pneumonia. Small right pleural effusion. Overall findings suggest CHF, concurrent left lower lobe pneumonia not excluded. Recommend continued radiographic follow-up to resolution, as retrocardiac opacity has been present on prior exams. Electronically  Signed   By: Jeb Levering M.D.   On: 07/10/2017 21:15   Scheduled Meds: . aspirin EC  81 mg Oral Daily  . atorvastatin  40 mg Oral q1800  . calcium carbonate  1 tablet Oral BID  . cholecalciferol  1,000 Units Oral Daily  . cholecalciferol  400 Units Oral BID  . diltiazem  180 mg Oral BID  . FLUoxetine  40 mg Oral Daily  . furosemide  60 mg Intravenous Once  . guaiFENesin  600 mg Oral BID  . ipratropium-albuterol  3 mL Nebulization Q6H  . metoprolol tartrate  12.5 mg Oral BID  . mometasone-formoterol  2 puff Inhalation BID  . multivitamin with minerals  1 tablet Oral Daily  . pantoprazole  40 mg Oral BID  . potassium chloride  40 mEq Oral TID  . sodium chloride flush  3 mL Intravenous Q12H  . topiramate  50 mg Oral BID   Continuous Infusions: . sodium chloride    . aztreonam Stopped (07/12/17 5102)  . levofloxacin (LEVAQUIN) IV Stopped (07/11/17 2237)  . vancomycin 1,000 mg (07/12/17 0849)    Principal Problem:   LLL pneumonia ([place]) Active Problems:    Fever   Hyperlipidemia   Essential hypertension   CAD, NATIVE VESSEL   Atrial fibrillation (HCC)   PVD   Dysphagia as late effect of stroke   GERD (gastroesophageal reflux disease)   Cardiac pacemaker in situ   MCI (mild cognitive impairment) with memory loss   Sepsis ([place])   Hypokalemia   HCAP (healthcare-associated pneumonia)  Time spent:   Irwin Brakeman, MD, FAAFP [facility] Pager [phone]  If 7PM-7AM, please contact night-coverage [email or web address] Password [email or web address] 07/12/2017, 9:53 AM    LOS: 0 days

## 2017-07-12 NOTE — Progress Notes (Signed)
*  PRELIMINARY RESULTS* Echocardiogram 2D Echocardiogram has been performed with Definity.  Samuel Germany 07/12/2017, 2:58 PM

## 2017-07-12 NOTE — Progress Notes (Signed)
Condom catheter placed this AM after receiving IV lasix.  Catheter became dislodged while up with PT, condom cath replaced and WNL.  1 occurrence of urine unmeasurable.

## 2017-07-13 DIAGNOSIS — E785 Hyperlipidemia, unspecified: Secondary | ICD-10-CM | POA: Diagnosis present

## 2017-07-13 DIAGNOSIS — D649 Anemia, unspecified: Secondary | ICD-10-CM | POA: Diagnosis present

## 2017-07-13 DIAGNOSIS — F0390 Unspecified dementia without behavioral disturbance: Secondary | ICD-10-CM | POA: Diagnosis present

## 2017-07-13 DIAGNOSIS — Z8546 Personal history of malignant neoplasm of prostate: Secondary | ICD-10-CM | POA: Diagnosis not present

## 2017-07-13 DIAGNOSIS — J181 Lobar pneumonia, unspecified organism: Secondary | ICD-10-CM | POA: Diagnosis not present

## 2017-07-13 DIAGNOSIS — I5023 Acute on chronic systolic (congestive) heart failure: Secondary | ICD-10-CM | POA: Diagnosis present

## 2017-07-13 DIAGNOSIS — I69391 Dysphagia following cerebral infarction: Secondary | ICD-10-CM | POA: Diagnosis not present

## 2017-07-13 DIAGNOSIS — Z881 Allergy status to other antibiotic agents status: Secondary | ICD-10-CM | POA: Diagnosis not present

## 2017-07-13 DIAGNOSIS — A419 Sepsis, unspecified organism: Secondary | ICD-10-CM | POA: Diagnosis not present

## 2017-07-13 DIAGNOSIS — Z880 Allergy status to penicillin: Secondary | ICD-10-CM | POA: Diagnosis not present

## 2017-07-13 DIAGNOSIS — J189 Pneumonia, unspecified organism: Secondary | ICD-10-CM | POA: Diagnosis not present

## 2017-07-13 DIAGNOSIS — Z951 Presence of aortocoronary bypass graft: Secondary | ICD-10-CM | POA: Diagnosis not present

## 2017-07-13 DIAGNOSIS — Z7951 Long term (current) use of inhaled steroids: Secondary | ICD-10-CM | POA: Diagnosis not present

## 2017-07-13 DIAGNOSIS — I481 Persistent atrial fibrillation: Secondary | ICD-10-CM | POA: Diagnosis present

## 2017-07-13 DIAGNOSIS — I10 Essential (primary) hypertension: Secondary | ICD-10-CM | POA: Diagnosis not present

## 2017-07-13 DIAGNOSIS — I071 Rheumatic tricuspid insufficiency: Secondary | ICD-10-CM | POA: Diagnosis present

## 2017-07-13 DIAGNOSIS — J440 Chronic obstructive pulmonary disease with acute lower respiratory infection: Secondary | ICD-10-CM | POA: Diagnosis present

## 2017-07-13 DIAGNOSIS — Z950 Presence of cardiac pacemaker: Secondary | ICD-10-CM | POA: Diagnosis not present

## 2017-07-13 DIAGNOSIS — Z87891 Personal history of nicotine dependence: Secondary | ICD-10-CM | POA: Diagnosis not present

## 2017-07-13 DIAGNOSIS — I2510 Atherosclerotic heart disease of native coronary artery without angina pectoris: Secondary | ICD-10-CM | POA: Diagnosis not present

## 2017-07-13 DIAGNOSIS — R509 Fever, unspecified: Secondary | ICD-10-CM | POA: Diagnosis not present

## 2017-07-13 DIAGNOSIS — I110 Hypertensive heart disease with heart failure: Secondary | ICD-10-CM | POA: Diagnosis present

## 2017-07-13 DIAGNOSIS — Z888 Allergy status to other drugs, medicaments and biological substances status: Secondary | ICD-10-CM | POA: Diagnosis not present

## 2017-07-13 DIAGNOSIS — E876 Hypokalemia: Secondary | ICD-10-CM | POA: Diagnosis not present

## 2017-07-13 DIAGNOSIS — K219 Gastro-esophageal reflux disease without esophagitis: Secondary | ICD-10-CM | POA: Diagnosis present

## 2017-07-13 DIAGNOSIS — G4733 Obstructive sleep apnea (adult) (pediatric): Secondary | ICD-10-CM | POA: Diagnosis present

## 2017-07-13 DIAGNOSIS — Y95 Nosocomial condition: Secondary | ICD-10-CM | POA: Diagnosis present

## 2017-07-13 DIAGNOSIS — Z7982 Long term (current) use of aspirin: Secondary | ICD-10-CM | POA: Diagnosis not present

## 2017-07-13 DIAGNOSIS — I482 Chronic atrial fibrillation: Secondary | ICD-10-CM | POA: Diagnosis not present

## 2017-07-13 LAB — COMPREHENSIVE METABOLIC PANEL
ALT: 17 U/L (ref 17–63)
ANION GAP: 6 (ref 5–15)
AST: 16 U/L (ref 15–41)
Albumin: 3 g/dL — ABNORMAL LOW (ref 3.5–5.0)
Alkaline Phosphatase: 66 U/L (ref 38–126)
BUN: 20 mg/dL (ref 6–20)
CHLORIDE: 106 mmol/L (ref 101–111)
CO2: 25 mmol/L (ref 22–32)
Calcium: 8.5 mg/dL — ABNORMAL LOW (ref 8.9–10.3)
Creatinine, Ser: 1 mg/dL (ref 0.61–1.24)
GFR calc non Af Amer: >60 mL/min (ref >60)
GLUCOSE: 106 mg/dL — AB (ref 65–99)
Potassium: 3.8 mmol/L (ref 3.5–5.1)
SODIUM: 137 mmol/L (ref 135–145)
Total Bilirubin: 0.7 mg/dL (ref 0.3–1.2)
Total Protein: 6.1 g/dL — ABNORMAL LOW (ref 6.5–8.1)

## 2017-07-13 LAB — CBC WITH DIFFERENTIAL/PLATELET
BASOS PCT: 0 %
Basophils Absolute: 0 10*3/uL (ref 0.0–0.1)
EOS ABS: 0.1 10*3/uL (ref 0.0–0.7)
EOS PCT: 1 %
HCT: 36.4 % — ABNORMAL LOW (ref 39.0–52.0)
Hemoglobin: 11.1 g/dL — ABNORMAL LOW (ref 13.0–17.0)
LYMPHS ABS: 1.1 10*3/uL (ref 0.7–4.0)
Lymphocytes Relative: 11 %
MCH: 29.1 pg (ref 26.0–34.0)
MCHC: 30.5 g/dL (ref 30.0–36.0)
MCV: 95.5 fL (ref 78.0–100.0)
Monocytes Absolute: 1.3 10*3/uL — ABNORMAL HIGH (ref 0.1–1.0)
Monocytes Relative: 13 %
NEUTROS PCT: 75 %
Neutro Abs: 7.4 10*3/uL (ref 1.7–7.7)
PLATELETS: 227 10*3/uL (ref 150–400)
RBC: 3.81 MIL/uL — AB (ref 4.22–5.81)
RDW: 13.5 % (ref 11.5–15.5)
WBC: 10 10*3/uL (ref 4.0–10.5)

## 2017-07-13 LAB — MAGNESIUM: Magnesium: 1.7 mg/dL (ref 1.7–2.4)

## 2017-07-13 MED ORDER — IPRATROPIUM-ALBUTEROL 0.5-2.5 (3) MG/3ML IN SOLN: 3.0000 mL | Freq: Four times a day (QID) | RESPIRATORY_TRACT | 0 refills | Status: DC

## 2017-07-13 MED ORDER — LEVOFLOXACIN 750 MG PO TABS: 750.0000 mg | ORAL_TABLET | Freq: Every day | ORAL | Status: DC

## 2017-07-13 MED ORDER — POTASSIUM CHLORIDE ER 10 MEQ PO TBCR: 20.0000 meq | EXTENDED_RELEASE_TABLET | Freq: Every day | ORAL | 0 refills | Status: DC

## 2017-07-13 MED ORDER — DOXYCYCLINE HYCLATE 100 MG PO CAPS: 100.0000 mg | ORAL_CAPSULE | Freq: Two times a day (BID) | ORAL | 0 refills | Status: AC

## 2017-07-13 MED ORDER — FUROSEMIDE 20 MG PO TABS: 40.0000 mg | ORAL_TABLET | Freq: Every day | ORAL | 0 refills | Status: DC

## 2017-07-13 MED ORDER — LEVOFLOXACIN 750 MG PO TABS: 750.0000 mg | ORAL_TABLET | Freq: Every day | ORAL | 0 refills | Status: DC

## 2017-07-13 NOTE — Care Management Important Message (Signed)
Important Message  Patient Details  Name: TIMON GEISSINGER MRN: [id number] Date of Birth: 03/04/1933   Medicare Important Message Given:  Yes    Graylon Amory, Chauncey Reading, RN 07/13/2017, 9:49 AM

## 2017-07-13 NOTE — Discharge Instructions (Signed)
Follow with Primary MD  Husain, Karrar, MD  and other consultant's as instructed your Hospitalist MD ° °Please get a complete blood count and chemistry panel checked by your Primary MD at your next visit, and again as instructed by your Primary MD. ° °Get Medicines reviewed and adjusted: °Please take all your medications with you for your next visit with your Primary MD ° °Laboratory/radiological data: °Please request your Primary MD to go over all hospital tests and procedure/radiological results at the follow up, please ask your Primary MD to get all Hospital records sent to his/her office. ° °In some cases, they will be blood work, cultures and biopsy results pending at the time of your discharge. Please request that your primary care M.D. follows up on these results. ° °Also Note the following: °If you experience worsening of your admission symptoms, develop shortness of breath, life threatening emergency, suicidal or homicidal thoughts you must seek medical attention immediately by calling 911 or calling your MD immediately  if symptoms less severe. ° °You must read complete instructions/literature along with all the possible adverse reactions/side effects for all the Medicines you take and that have been prescribed to you. Take any new Medicines after you have completely understood and accpet all the possible adverse reactions/side effects.  ° °Do not drive when taking Pain medications or sleeping medications (Benzodaizepines) ° °Do not take more than prescribed Pain, Sleep and Anxiety Medications. It is not advisable to combine anxiety,sleep and pain medications without talking with your primary care practitioner ° °Special Instructions: If you have smoked or chewed Tobacco  in the last 2 yrs please stop smoking, stop any regular Alcohol  and or any Recreational drug use. ° °Wear Seat belts while driving. ° °Please note: °You were cared for by a hospitalist during your hospital stay. Once you are discharged,  your primary care physician will handle any further medical issues. Please note that NO REFILLS for any discharge medications will be authorized once you are discharged, as it is imperative that you return to your primary care physician (or establish a relationship with a primary care physician if you do not have one) for your post hospital discharge needs so that they can reassess your need for medications and monitor your lab values. ° ° ° ° °

## 2017-07-13 NOTE — Care Management (Signed)
Patient will qualify for oxygen. Offered choice of DME agency to patient/wife. Romualdo Bolk of [facility] notified and will obtain orders from chart when available. Wife aware that tank will be delivered to room prior to discharge and will be instructed on use by [facility].

## 2017-07-13 NOTE — Progress Notes (Signed)
Pharmacy Antibiotic Note  Zachary Warren is a 83 y.o. male admitted on 07/10/2017 with sepsis.  Pharmacy has been consulted for  Levaquin dosing. Tmax 99.2, WBC improved. BCx and UCx ngtd.  Plan: Continue Levaquin 750 mg every 24 hours Continue to f/u cxs and clinical progress Transition to po as indicated  Height: 5\' 9"  (175.3 cm) Weight: 193 lb (87.5 kg) IBW/kg (Calculated) : 70.7  Temp (24hrs), Avg:98.4 F (36.9 C), Min:97.7 F (36.5 C), Max:99.2 F (37.3 C)   Recent Labs Lab 07/10/17 2042 07/10/17 2052 07/11/17 0512 07/12/17 0409 07/13/17 0437  WBC 15.1*  --  13.9* 10.9* 10.0  CREATININE 0.85  --  0.75 0.91 1.00  LATICACIDVEN  --  1.28  --   --   --     Estimated Creatinine Clearance: 62.4 mL/min (by C-G formula based on SCr of 1 mg/dL).    Allergies  Allergen Reactions  . Aricept [Donepezil Hcl] Other (See Comments)    "stomach pain", generic  . Ceclor [Cefaclor]   . Namenda [Memantine Hcl] Other (See Comments)    "dizzy", brand medication  . Penicillins     Has patient had a PCN reaction causing immediate rash, facial/tongue/throat swelling, SOB or lightheadedness with hypotension: Unknown Has patient had a PCN reaction causing severe rash involving mucus membranes or skin necrosis: Unknown Has patient had a PCN reaction that required hospitalization: Unknown Has patient had a PCN reaction occurring within the last 10 years: Unknown If all of the above answers are "NO", then may proceed with Cephalosporin use.   . Ramipril Other (See Comments)     cough    Antimicrobials this admission: Vancomycin  9/29 >> 10/1 Azactam 9/29 >> 10/1 Levaquin 9/29>>  Micro: 9/29 BCx: ngtd 9/29 UCx: no growth 9/30 Sputum Cx: GPC, GNC, GNR, GPR  Thank you for allowing pharmacy to be a part of this patient's care.  Isac Sarna, BS Pharm D, [place] Clinical Pharmacist Pager [phone] 07/13/2017 8:55 AM

## 2017-07-13 NOTE — Progress Notes (Signed)
SATURATION QUALIFICATIONS: (This note is used to comply with regulatory documentation for home oxygen)  Patient Saturations on Room Air at Rest = 92%  Patient Saturations on Room Air while Ambulating = 85%  Patient Saturations on 2 Liters of oxygen while Ambulating = 94%  Please briefly explain why patient needs home oxygen: patients O2 sats dropped to 85% while walking, increased to 94% when placed on 2L O2

## 2017-07-13 NOTE — Care Management (Addendum)
    Durable Medical Equipment        Start     Ordered   07/13/17 1150  For home use only DME oxygen  Once    Question Answer Comment  Mode or (Route) Nasal cannula   Liters per Minute 2   Frequency Continuous (stationary and portable oxygen unit needed)   Oxygen conserving device Yes   Oxygen delivery system Gas      07/13/17 1150   07/13/17 0932  For home use only DME Nebulizer machine  Once    Question:  Patient needs a nebulizer to treat with the following condition  Answer:  COPD (chronic obstructive pulmonary disease) ([place])   07/13/17 0932    Patient qualifies for home oxygen. IV lasix, duonebs, inhaler and IV antibiotics have been tried and failed.

## 2017-07-13 NOTE — Discharge Summary (Addendum)
Physician Discharge Summary  Zachary Warren:[id number] DOB: 09-01-1933 DOA: 07/10/2017  PCP: Wenda Low, MD Cardiology: Dr. Marlou Porch  Admit date: 07/10/2017 Discharge date: 07/13/2017  Admitted From: Home  Disposition: Home   Recommendations for Outpatient Follow-up:  1. Follow up with PCP in 1 weeks 2. Follow up with cardiology in 2 weeks 3. Please obtain BMP/CBC in one week 4. Please follow up final culture results.   Home Health: PT, RN, Home oxygen 2 L/ Min, Home nebulizer  Discharge Condition: STABLE  CODE STATUS: FULL    Brief Hospitalization Summary: Please see all hospital notes, images, labs for full details of the hospitalization. From HPI: Zachary Warren is a 83 y.o. male with medical history significant of recent d/c from rehab on Thursday went home with wife and has been having chills 1 day PTA.  Pt reports coughing for several days.  He is pleasantly easily confused.  He deneis any n/v/d.  No pain anywhere.  He did not know he was having a fever until he got to ED. Temp over 102. Denies any sob.  But with productive cough.  Found to have pna and referred for admission for treatment of such.  Brief Admission Hx: Zachary Warren a 83 y.o.malewith medical history significant of recent d/c from rehab on Thursday went home with wife and has been having chills for 1 day prior to admission. Pt reports coughing for several days.  He was admitted with healthcare associated pneumonia.  MDM/Assessment & Plan:   1. Healthcare associated pneumonia of lower lobe-clinically much better, doxycycline x 5 more days. Patient clinically is showing some improvement. Continue supportive therapy. Follow up final culture results outpatient.  Pt qualifies to receive home oxygen.  Ordered 2L per minute Home oxygen at discharge.  2. Congestive heart failure acute on chronic systolic with pulmonary edema-pt diuresed well after IV lasix given yesterday. Discharge home on 40 mg lasix daily  with potassium supplement.  Echo unchanged essentially from 4/18.  3. Hypokalemia-repleted. Recheck in 1 week outpatient.  4. Sepsis-secondary to pneumonia-resolved with supportive therapy. 5. Atrial fibrillation-chronic, resuming home medications. Rate is currently controlled. Cardiac pacemaker in situ. 6. Essential hypertension-resume home medications. 7. Dementia-supportive therapy. Wife is with him 24/7.   8. Chronic debility-Home health PT ordered. Patient was just released from acute rehabilitation. 9. Anemia, unspecified-monitored hemoglobin. It has been stable.  10. GERD - Protonix ordered  DVT prophylaxis: SCDs Code Status: FULL  Family Communication: wife at bedside Disposition Plan: Home with [facility] services  Discharge Diagnoses:  Principal Problem:   LLL pneumonia ([place]) Active Problems:   Fever   Hyperlipidemia   Essential hypertension   CAD, NATIVE VESSEL   Atrial fibrillation (HCC)   PVD   Dysphagia as late effect of stroke   GERD (gastroesophageal reflux disease)   Cardiac pacemaker in situ   MCI (mild cognitive impairment) with memory loss   Sepsis ([place])   Hypokalemia   HCAP (healthcare-associated pneumonia)  Discharge Instructions: Discharge Instructions    (HEART FAILURE PATIENTS) Call MD:  Anytime you have any of the following symptoms: 1) 3 pound weight gain in 24 hours or 5 pounds in 1 week 2) shortness of breath, with or without a dry hacking cough 3) swelling in the hands, feet or stomach 4) if you have to sleep on extra pillows at night in order to breathe.    Complete by:  As directed    Call MD for:  difficulty breathing, headache or visual disturbances  Complete by:  As directed    Call MD for:  extreme fatigue    Complete by:  As directed    Call MD for:  persistant dizziness or light-headedness    Complete by:  As directed    Call MD for:  persistant nausea and vomiting    Complete by:  As directed    Call MD for:  severe uncontrolled pain     Complete by:  As directed    Diet - low sodium heart healthy    Complete by:  As directed    Increase activity slowly    Complete by:  As directed      Allergies as of 07/13/2017      Reactions   Aricept [donepezil Hcl] Other (See Comments)   "stomach pain", generic   Ceclor [cefaclor]    Namenda [memantine Hcl] Other (See Comments)   "dizzy", brand medication   Penicillins    Has patient had a PCN reaction causing immediate rash, facial/tongue/throat swelling, SOB or lightheadedness with hypotension: Unknown Has patient had a PCN reaction causing severe rash involving mucus membranes or skin necrosis: Unknown Has patient had a PCN reaction that required hospitalization: Unknown Has patient had a PCN reaction occurring within the last 10 years: Unknown If all of the above answers are "NO", then may proceed with Cephalosporin use.   Ramipril Other (See Comments)    cough      Medication List    TAKE these medications   aspirin EC 81 MG tablet Take 81 mg by mouth daily.   atorvastatin 40 MG tablet Commonly known as:  LIPITOR TAKE 1 TABLET BY MOUTH EVERY DAY - PATIENT DUE FOR LIPID PANEL What changed:  See the new instructions.   budesonide-formoterol 80-4.5 MCG/ACT inhaler Commonly known as:  SYMBICORT Inhale 2 puffs into the lungs daily.   calcium carbonate 500 MG chewable tablet Commonly known as:  TUMS - dosed in mg elemental calcium Chew 1 tablet by mouth 2 (two) times daily.   cholecalciferol 1000 units tablet Commonly known as:  VITAMIN D Take 1,000 Units by mouth daily. What changed:  Another medication with the same name was removed. Continue taking this medication, and follow the directions you see here.   diltiazem 180 MG 24 hr capsule Commonly known as:  CARDIZEM CD TAKE 1 CAPSULE BY MOUTH TWICE DAILY   doxycycline 100 MG capsule Commonly known as:  VIBRAMYCIN Take 1 capsule (100 mg total) by mouth 2 (two) times daily.   FLUoxetine 40 MG  capsule Commonly known as:  PROZAC Take 40 mg by mouth daily.   furosemide 20 MG tablet Commonly known as:  LASIX Take 2 tablets (40 mg total) by mouth daily. What changed:  how much to take   guaiFENesin 600 MG 12 hr tablet Commonly known as:  MUCINEX Take 600 mg by mouth 2 (two) times daily.   ipratropium-albuterol 0.5-2.5 (3) MG/3ML Soln Commonly known as:  DUONEB Take 3 mLs by nebulization every 6 (six) hours.   metoprolol tartrate 25 MG tablet Commonly known as:  LOPRESSOR Take 0.5 tablets (12.5 mg total) by mouth 2 (two) times daily.   multivitamin with minerals tablet Take 1 tablet by mouth daily. Chewable flinstones   omeprazole 20 MG tablet Commonly known as:  PRILOSEC OTC Take 20 mg by mouth 2 (two) times daily.   potassium chloride 10 MEQ tablet Commonly known as:  K-DUR Take 2 tablets (20 mEq total) by mouth daily.   topiramate  50 MG tablet Commonly known as:  TOPAMAX Take 1 tablet (50 mg total) by mouth 2 (two) times daily.   traMADol 50 MG tablet Commonly known as:  ULTRAM Take 1 tablet (50 mg total) by mouth every 6 (six) hours as needed. For post-op pain.            Durable Medical Equipment        Start     Ordered   07/13/17 0932  For home use only DME Nebulizer machine  Once    Question:  Patient needs a nebulizer to treat with the following condition  Answer:  COPD (chronic obstructive pulmonary disease) ([place])   07/13/17 0932     Follow-up Information    Wenda Low, MD. Schedule an appointment as soon as possible for a visit in 1 week(s).   Specialty:  Internal Medicine Why:  Hospital Follow Up  Contact information: [address] [place] [address] [phone]        Jerline Pain, MD. Schedule an appointment as soon as possible for a visit in 2 week(s).   Specialty:  Cardiology Why:  Hospital Follow Up  Contact information: [address] [address] [place] Follow up.   Why:  home health RN and PT Contact information: [address] [place] [address] [phone]         Allergies  Allergen Reactions  . Aricept [Donepezil Hcl] Other (See Comments)    "stomach pain", generic  . Ceclor [Cefaclor]   . Namenda [Memantine Hcl] Other (See Comments)    "dizzy", brand medication  . Penicillins     Has patient had a PCN reaction causing immediate rash, facial/tongue/throat swelling, SOB or lightheadedness with hypotension: Unknown Has patient had a PCN reaction causing severe rash involving mucus membranes or skin necrosis: Unknown Has patient had a PCN reaction that required hospitalization: Unknown Has patient had a PCN reaction occurring within the last 10 years: Unknown If all of the above answers are "NO", then may proceed with Cephalosporin use.   . Ramipril Other (See Comments)     cough   Current Discharge Medication List    START taking these medications   Details  doxycycline (VIBRAMYCIN) 100 MG capsule Take 1 capsule (100 mg total) by mouth 2 (two) times daily. Qty: 10 capsule, Refills: 0    ipratropium-albuterol (DUONEB) 0.5-2.5 (3) MG/3ML SOLN Take 3 mLs by nebulization every 6 (six) hours. Qty: 360 mL, Refills: 0    potassium chloride (K-DUR) 10 MEQ tablet Take 2 tablets (20 mEq total) by mouth daily. Qty: 60 tablet, Refills: 0      CONTINUE these medications which have CHANGED   Details  furosemide (LASIX) 20 MG tablet Take 2 tablets (40 mg total) by mouth daily. Qty: 60 tablet, Refills: 0      CONTINUE these medications which have NOT CHANGED   Details  aspirin EC 81 MG tablet Take 81 mg by mouth daily.    atorvastatin (LIPITOR) 40 MG tablet TAKE 1 TABLET BY MOUTH EVERY DAY - PATIENT DUE FOR LIPID PANEL Qty: 90 tablet, Refills: 1    budesonide-formoterol (SYMBICORT) 80-4.5 MCG/ACT inhaler Inhale 2 puffs into the lungs daily.    calcium carbonate (TUMS - DOSED IN MG  ELEMENTAL CALCIUM) 500 MG chewable tablet Chew 1 tablet by mouth 2 (two) times daily.    cholecalciferol (VITAMIN D)  1000 units tablet Take 1,000 Units by mouth daily.    diltiazem (CARDIZEM CD) 180 MG 24 hr capsule TAKE 1 CAPSULE BY MOUTH TWICE DAILY Qty: 180 capsule, Refills: 3    FLUoxetine (PROZAC) 40 MG capsule Take 40 mg by mouth daily.      guaiFENesin (MUCINEX) 600 MG 12 hr tablet Take 600 mg by mouth 2 (two) times daily.    metoprolol tartrate (LOPRESSOR) 25 MG tablet Take 0.5 tablets (12.5 mg total) by mouth 2 (two) times daily. Qty: 60 tablet, Refills: 0    Multiple Vitamins-Minerals (MULTIVITAMIN WITH MINERALS) tablet Take 1 tablet by mouth daily. Chewable flinstones    omeprazole (PRILOSEC OTC) 20 MG tablet Take 20 mg by mouth 2 (two) times daily.      topiramate (TOPAMAX) 50 MG tablet Take 1 tablet (50 mg total) by mouth 2 (two) times daily. Qty: 180 tablet, Refills: 4    traMADol (ULTRAM) 50 MG tablet Take 1 tablet (50 mg total) by mouth every 6 (six) hours as needed. For post-op pain. Qty: 20 tablet, Refills: 0        Procedures/Studies: Dg Chest Port 1 View  Result Date: 07/12/2017 CLINICAL DATA:  Left lower lobe pneumonia EXAM: PORTABLE CHEST 1 VIEW COMPARISON:  Two days ago FINDINGS: Haziness of the left more than right bases consistent with pleural fluid right atelectasis. There is history of left lower lobe pneumonia which would be obscured. Vascular congestion/ edema. Cardiomegaly with dual-chamber implant. Previous median sternotomy with history of CABG. No evidence of pneumothorax. IMPRESSION: Progressive pleural effusions and vascular congestion/ edema. Electronically Signed   By: Monte Fantasia M.D.   On: 07/12/2017 07:33   Dg Chest Port 1 View  Result Date: 07/10/2017 CLINICAL DATA:  Cough and fever. EXAM: PORTABLE CHEST 1 VIEW COMPARISON:  Radiographs 05/13/2017 FINDINGS: Post median sternotomy. Left-sided pacemaker in place. Cardiomegaly with  atherosclerotic thoracic aorta. Progressive retrocardiac opacity from prior exam likely combination of pleural fluid and airspace disease. Small right pleural effusion. Increased peribronchial thickening likely pulmonary edema. Lungs remain hyperinflated. No pneumothorax. IMPRESSION: 1. Cardiomegaly with peribronchial thickening favoring pulmonary edema. 2. Retrocardiac opacity likely combination of pleural fluid and airspace disease, atelectasis or pneumonia. Small right pleural effusion. Overall findings suggest CHF, concurrent left lower lobe pneumonia not excluded. Recommend continued radiographic follow-up to resolution, as retrocardiac opacity has been present on prior exams. Electronically Signed   By: Jeb Levering M.D.   On: 07/10/2017 21:15     Echocardiogram 07/12/17 Study Conclusions  - Left ventricle: The cavity size was normal. There was mild concentric hypertrophy. Systolic function was normal. The estimated ejection fraction was in the range of 55% to 60%. Wall  motion was normal; there were no regional wall motion abnormalities. The study was not technically sufficient to allow evaluation of LV diastolic dysfunction due to atrial  fibrillation. - Ventricular septum: The contour showed systolic flattening. These  changes are consistent with RV pressure overload. - Aortic valve: Mildly to moderately calcified annulus. Trileaflet. - Mitral valve: There was mild regurgitation. - Left atrium: The atrium was severely dilated. - Right ventricle: Pacer wire or catheter noted in right ventricle.  Systolic function was mildly reduced. - Atrial septum: No defect or patent foramen ovale was identified. - Tricuspid valve: There was mild regurgitation. - Pulmonary arteries: Systolic pressure was moderately increased.  PA peak pressure: 55 mm Hg (S). - Inferior vena cava: The vessel was dilated. The respirophasic diameter changes were blunted (< 50%), consistent with  elevated central venous  pressure. Estimated right atrial pressure: 15 mmHg. - Pericardium, extracardiac: There was a left pleural effusion.  Subjective: Pt slept all night.  He is feeling better and back to baseline per wife and niece.  Discharge Exam: Vitals:   07/13/17 0619 07/13/17 0806  BP:    Pulse:    Resp:    Temp: 97.7 F (36.5 C)   SpO2: 96% 94%   Vitals:   07/13/17 0128 07/13/17 0517 07/13/17 0619 07/13/17 0806  BP:  (!) 129/56    Pulse:  77    Resp:  16    Temp:  98.1 F (36.7 C) 97.7 F (36.5 C)   TempSrc:  Oral Oral   SpO2: 91% 93% 96% 94%  Weight:      Height:       General exam: awake, alert, demented, NAD Respiratory system:  rales heard, but much improved from previous exam.  No increased work of breathing. Cardiovascular system: S1 & S2 heard. No JVD, murmurs, gallops, clicks or pedal edema. Gastrointestinal system: Abdomen is nondistended, soft and nontender. Normal bowel sounds heard. Central nervous system: Alert. Demented. No focal neurological deficits. Extremities: no edema seen.    The results of significant diagnostics from this hospitalization (including imaging, microbiology, ancillary and laboratory) are listed below for reference.     Microbiology: Recent Results (from the past 240 hour(s))  Blood Culture (routine x 2)     Status: None (Preliminary result)   Collection Time: 07/10/17  8:42 PM  Result Value Ref Range Status   Specimen Description RIGHT ANTECUBITAL  Final   Special Requests   Final    BOTTLES DRAWN AEROBIC AND ANAEROBIC Blood Culture adequate volume   Culture NO GROWTH 3 DAYS  Final   Report Status PENDING  Incomplete  Blood Culture (routine x 2)     Status: None (Preliminary result)   Collection Time: 07/10/17  8:50 PM  Result Value Ref Range Status   Specimen Description BLOOD LEFT HAND  Final   Special Requests   Final    BOTTLES DRAWN AEROBIC AND ANAEROBIC Blood Culture adequate volume   Culture NO GROWTH 3 DAYS  Final   Report Status  PENDING  Incomplete  Urine culture     Status: None   Collection Time: 07/10/17  9:35 PM  Result Value Ref Range Status   Specimen Description URINE, CATHETERIZED  Final   Special Requests NONE  Final   Culture   Final    NO GROWTH Performed at [facility], [place] [address], [place], [place] [address]    Report Status 07/12/2017 FINAL  Final  Culture, sputum-assessment     Status: None   Collection Time: 07/11/17 11:46 AM  Result Value Ref Range Status   Specimen Description EXPECTORATED SPUTUM  Final   Special Requests NONE  Final   Sputum evaluation   Final    THIS SPECIMEN IS ACCEPTABLE FOR SPUTUM CULTURE Performed at [facility]    Report Status 07/11/2017 FINAL  Final  Culture, respiratory (NON-Expectorated)     Status: None (Preliminary result)   Collection Time: 07/11/17 11:46 AM  Result Value Ref Range Status   Specimen Description EXPECTORATED SPUTUM  Final   Special Requests NONE  Final   Gram Stain   Final    NO WBC SEEN RARE SQUAMOUS EPITHELIAL CELLS PRESENT ABUNDANT GRAM POSITIVE COCCI IN PAIRS IN CLUSTERS FEW GRAM NEGATIVE COCCI IN PAIRS FEW GRAM NEGATIVE RODS FEW GRAM POSITIVE  RODS RARE BUDDING YEAST SEEN    Culture   Final    CULTURE REINCUBATED FOR BETTER GROWTH Performed at [facility], [place] [address], [place], [place] [address]    Report Status PENDING  Incomplete     Labs: BNP (last 3 results)  Recent Labs  05/13/17 0940 07/10/17 2042  BNP 258.0* 604.5*   Basic Metabolic Panel:  Recent Labs Lab 07/10/17 2042 07/11/17 0512 07/12/17 0409 07/13/17 0437  NA 146* 143 141 137  K 3.2* 2.8* 3.2* 3.8  CL 112* 111 106 106  CO2 24 25 26 25   GLUCOSE 181* 118* 107* 106*  BUN 20 16 20 20   CREATININE 0.85 0.75 0.91 1.00  CALCIUM 8.9 8.0* 8.2* 8.5*  MG  --  1.8 1.7 1.7   Liver Function Tests:  Recent Labs Lab 07/10/17 2042 07/12/17 0409 07/13/17 0437  AST 19 16 16   ALT 18 14* 17  ALKPHOS 77 71 66  BILITOT 0.7 0.8  0.7  PROT 7.3 6.2* 6.1*  ALBUMIN 3.7 3.0* 3.0*   No results for input(s): LIPASE, AMYLASE in the last 168 hours. No results for input(s): AMMONIA in the last 168 hours. CBC:  Recent Labs Lab 07/10/17 2042 07/11/17 0512 07/12/17 0409 07/13/17 0437  WBC 15.1* 13.9* 10.9* 10.0  NEUTROABS 13.2* 11.2* 8.8* 7.4  HGB 12.7* 11.5* 11.4* 11.1*  HCT 41.4 36.7* 37.0* 36.4*  MCV 94.5 94.1 95.1 95.5  PLT 265 219 231 227   Cardiac Enzymes:  Recent Labs Lab 07/10/17 2042  TROPONINI <0.03   BNP: Invalid input(s): POCBNP CBG: No results for input(s): GLUCAP in the last 168 hours. D-Dimer No results for input(s): DDIMER in the last 72 hours. Hgb A1c No results for input(s): HGBA1C in the last 72 hours. Lipid Profile No results for input(s): CHOL, HDL, LDLCALC, TRIG, CHOLHDL, LDLDIRECT in the last 72 hours. Thyroid function studies No results for input(s): TSH, T4TOTAL, T3FREE, THYROIDAB in the last 72 hours.  Invalid input(s): FREET3 Anemia work up No results for input(s): VITAMINB12, FOLATE, FERRITIN, TIBC, IRON, RETICCTPCT in the last 72 hours. Urinalysis    Component Value Date/Time   COLORURINE YELLOW 07/10/2017 2135   APPEARANCEUR CLEAR 07/10/2017 2135   LABSPEC 1.018 07/10/2017 2135   PHURINE 6.0 07/10/2017 2135   GLUCOSEU NEGATIVE 07/10/2017 2135   HGBUR SMALL (A) 07/10/2017 2135   BILIRUBINUR NEGATIVE 07/10/2017 2135   KETONESUR NEGATIVE 07/10/2017 2135   PROTEINUR 30 (A) 07/10/2017 2135   UROBILINOGEN 0.2 10/23/2014 2359   NITRITE NEGATIVE 07/10/2017 2135   LEUKOCYTESUR NEGATIVE 07/10/2017 2135   Sepsis Labs Invalid input(s): PROCALCITONIN,  WBC,  LACTICIDVEN Microbiology Recent Results (from the past 240 hour(s))  Blood Culture (routine x 2)     Status: None (Preliminary result)   Collection Time: 07/10/17  8:42 PM  Result Value Ref Range Status   Specimen Description RIGHT ANTECUBITAL  Final   Special Requests   Final    BOTTLES DRAWN AEROBIC AND ANAEROBIC  Blood Culture adequate volume   Culture NO GROWTH 3 DAYS  Final   Report Status PENDING  Incomplete  Blood Culture (routine x 2)     Status: None (Preliminary result)   Collection Time: 07/10/17  8:50 PM  Result Value Ref Range Status   Specimen Description BLOOD LEFT HAND  Final   Special Requests   Final    BOTTLES DRAWN AEROBIC AND ANAEROBIC Blood Culture adequate volume   Culture NO GROWTH 3 DAYS  Final  Report Status PENDING  Incomplete  Urine culture     Status: None   Collection Time: 07/10/17  9:35 PM  Result Value Ref Range Status   Specimen Description URINE, CATHETERIZED  Final   Special Requests NONE  Final   Culture   Final    NO GROWTH Performed at [facility], [address] [place], [place] [address]    Report Status 07/12/2017 FINAL  Final  Culture, sputum-assessment     Status: None   Collection Time: 07/11/17 11:46 AM  Result Value Ref Range Status   Specimen Description EXPECTORATED SPUTUM  Final   Special Requests NONE  Final   Sputum evaluation   Final    THIS SPECIMEN IS ACCEPTABLE FOR SPUTUM CULTURE Performed at [facility]    Report Status 07/11/2017 FINAL  Final  Culture, respiratory (NON-Expectorated)     Status: None (Preliminary result)   Collection Time: 07/11/17 11:46 AM  Result Value Ref Range Status   Specimen Description EXPECTORATED SPUTUM  Final   Special Requests NONE  Final   Gram Stain   Final    NO WBC SEEN RARE SQUAMOUS EPITHELIAL CELLS PRESENT ABUNDANT GRAM POSITIVE COCCI IN PAIRS IN CLUSTERS FEW GRAM NEGATIVE COCCI IN PAIRS FEW GRAM NEGATIVE RODS FEW GRAM POSITIVE RODS RARE BUDDING YEAST SEEN    Culture   Final    CULTURE REINCUBATED FOR BETTER GROWTH Performed at [facility], [place] [address], [place], [place] [address]    Report Status PENDING  Incomplete   Time coordinating discharge: 36 minutes  SIGNED:  Irwin Brakeman, MD  [facility] 07/13/2017, 10:01 AM Pager [phone]  If  7PM-7AM, please contact night-coverage [email or web address] Password [email or web address]

## 2017-07-13 NOTE — Progress Notes (Signed)
Patient discharged home.  IVs removed - WNL. Reviewed DC instructions and medications with wife.  Instructed to follow up with PCP and cardio.  Emphasized importance of taking all meds as directed and educated on daily weights and diet for HF management at home.  Verbalizes understanding.  All questions answered.  Assisted off unit via WC in NAD.

## 2017-07-14 DIAGNOSIS — K219 Gastro-esophageal reflux disease without esophagitis: Secondary | ICD-10-CM | POA: Diagnosis not present

## 2017-07-14 DIAGNOSIS — Z87891 Personal history of nicotine dependence: Secondary | ICD-10-CM | POA: Diagnosis not present

## 2017-07-14 DIAGNOSIS — I2510 Atherosclerotic heart disease of native coronary artery without angina pectoris: Secondary | ICD-10-CM | POA: Diagnosis not present

## 2017-07-14 DIAGNOSIS — G4733 Obstructive sleep apnea (adult) (pediatric): Secondary | ICD-10-CM | POA: Diagnosis not present

## 2017-07-14 DIAGNOSIS — Z7982 Long term (current) use of aspirin: Secondary | ICD-10-CM | POA: Diagnosis not present

## 2017-07-14 DIAGNOSIS — E785 Hyperlipidemia, unspecified: Secondary | ICD-10-CM | POA: Diagnosis not present

## 2017-07-14 DIAGNOSIS — Z8546 Personal history of malignant neoplasm of prostate: Secondary | ICD-10-CM | POA: Diagnosis not present

## 2017-07-14 DIAGNOSIS — Z951 Presence of aortocoronary bypass graft: Secondary | ICD-10-CM | POA: Diagnosis not present

## 2017-07-14 DIAGNOSIS — I739 Peripheral vascular disease, unspecified: Secondary | ICD-10-CM | POA: Diagnosis not present

## 2017-07-14 DIAGNOSIS — I5023 Acute on chronic systolic (congestive) heart failure: Secondary | ICD-10-CM | POA: Diagnosis not present

## 2017-07-14 DIAGNOSIS — Z87442 Personal history of urinary calculi: Secondary | ICD-10-CM | POA: Diagnosis not present

## 2017-07-14 DIAGNOSIS — I482 Chronic atrial fibrillation: Secondary | ICD-10-CM | POA: Diagnosis not present

## 2017-07-14 DIAGNOSIS — I110 Hypertensive heart disease with heart failure: Secondary | ICD-10-CM | POA: Diagnosis not present

## 2017-07-14 DIAGNOSIS — F0390 Unspecified dementia without behavioral disturbance: Secondary | ICD-10-CM | POA: Diagnosis not present

## 2017-07-14 DIAGNOSIS — D649 Anemia, unspecified: Secondary | ICD-10-CM | POA: Diagnosis not present

## 2017-07-14 DIAGNOSIS — Z9981 Dependence on supplemental oxygen: Secondary | ICD-10-CM | POA: Diagnosis not present

## 2017-07-14 DIAGNOSIS — R1310 Dysphagia, unspecified: Secondary | ICD-10-CM | POA: Diagnosis not present

## 2017-07-14 DIAGNOSIS — M5090 Cervical disc disorder, unspecified, unspecified cervical region: Secondary | ICD-10-CM | POA: Diagnosis not present

## 2017-07-14 DIAGNOSIS — J181 Lobar pneumonia, unspecified organism: Secondary | ICD-10-CM | POA: Diagnosis not present

## 2017-07-14 DIAGNOSIS — Z950 Presence of cardiac pacemaker: Secondary | ICD-10-CM | POA: Diagnosis not present

## 2017-07-14 DIAGNOSIS — J440 Chronic obstructive pulmonary disease with acute lower respiratory infection: Secondary | ICD-10-CM | POA: Diagnosis not present

## 2017-07-14 DIAGNOSIS — I69391 Dysphagia following cerebral infarction: Secondary | ICD-10-CM | POA: Diagnosis not present

## 2017-07-14 LAB — CULTURE, RESPIRATORY W GRAM STAIN
Culture: NORMAL
Gram Stain: NONE SEEN

## 2017-07-14 LAB — CULTURE, RESPIRATORY

## 2017-07-15 DIAGNOSIS — I2510 Atherosclerotic heart disease of native coronary artery without angina pectoris: Secondary | ICD-10-CM | POA: Diagnosis not present

## 2017-07-15 DIAGNOSIS — I110 Hypertensive heart disease with heart failure: Secondary | ICD-10-CM | POA: Diagnosis not present

## 2017-07-15 DIAGNOSIS — I5023 Acute on chronic systolic (congestive) heart failure: Secondary | ICD-10-CM | POA: Diagnosis not present

## 2017-07-15 DIAGNOSIS — I69391 Dysphagia following cerebral infarction: Secondary | ICD-10-CM | POA: Diagnosis not present

## 2017-07-15 DIAGNOSIS — J440 Chronic obstructive pulmonary disease with acute lower respiratory infection: Secondary | ICD-10-CM | POA: Diagnosis not present

## 2017-07-15 DIAGNOSIS — J181 Lobar pneumonia, unspecified organism: Secondary | ICD-10-CM | POA: Diagnosis not present

## 2017-07-15 LAB — CULTURE, BLOOD (ROUTINE X 2)
CULTURE: NO GROWTH
Culture: NO GROWTH
SPECIAL REQUESTS: ADEQUATE
SPECIAL REQUESTS: ADEQUATE

## 2017-07-16 DIAGNOSIS — N200 Calculus of kidney: Secondary | ICD-10-CM | POA: Diagnosis not present

## 2017-07-16 DIAGNOSIS — R451 Restlessness and agitation: Secondary | ICD-10-CM | POA: Diagnosis not present

## 2017-07-16 DIAGNOSIS — J189 Pneumonia, unspecified organism: Secondary | ICD-10-CM | POA: Diagnosis not present

## 2017-07-16 DIAGNOSIS — I509 Heart failure, unspecified: Secondary | ICD-10-CM | POA: Diagnosis not present

## 2017-07-16 DIAGNOSIS — F0390 Unspecified dementia without behavioral disturbance: Secondary | ICD-10-CM | POA: Diagnosis not present

## 2017-07-16 DIAGNOSIS — Z23 Encounter for immunization: Secondary | ICD-10-CM | POA: Diagnosis not present

## 2017-07-16 DIAGNOSIS — R531 Weakness: Secondary | ICD-10-CM | POA: Diagnosis not present

## 2017-07-19 DIAGNOSIS — I110 Hypertensive heart disease with heart failure: Secondary | ICD-10-CM | POA: Diagnosis not present

## 2017-07-19 DIAGNOSIS — I5023 Acute on chronic systolic (congestive) heart failure: Secondary | ICD-10-CM | POA: Diagnosis not present

## 2017-07-19 DIAGNOSIS — J181 Lobar pneumonia, unspecified organism: Secondary | ICD-10-CM | POA: Diagnosis not present

## 2017-07-19 DIAGNOSIS — J440 Chronic obstructive pulmonary disease with acute lower respiratory infection: Secondary | ICD-10-CM | POA: Diagnosis not present

## 2017-07-19 DIAGNOSIS — I69391 Dysphagia following cerebral infarction: Secondary | ICD-10-CM | POA: Diagnosis not present

## 2017-07-19 DIAGNOSIS — I2510 Atherosclerotic heart disease of native coronary artery without angina pectoris: Secondary | ICD-10-CM | POA: Diagnosis not present

## 2017-07-20 DIAGNOSIS — I110 Hypertensive heart disease with heart failure: Secondary | ICD-10-CM | POA: Diagnosis not present

## 2017-07-20 DIAGNOSIS — J181 Lobar pneumonia, unspecified organism: Secondary | ICD-10-CM | POA: Diagnosis not present

## 2017-07-20 DIAGNOSIS — I5023 Acute on chronic systolic (congestive) heart failure: Secondary | ICD-10-CM | POA: Diagnosis not present

## 2017-07-20 DIAGNOSIS — I2510 Atherosclerotic heart disease of native coronary artery without angina pectoris: Secondary | ICD-10-CM | POA: Diagnosis not present

## 2017-07-20 DIAGNOSIS — I69391 Dysphagia following cerebral infarction: Secondary | ICD-10-CM | POA: Diagnosis not present

## 2017-07-20 DIAGNOSIS — J440 Chronic obstructive pulmonary disease with acute lower respiratory infection: Secondary | ICD-10-CM | POA: Diagnosis not present

## 2017-07-21 DIAGNOSIS — J181 Lobar pneumonia, unspecified organism: Secondary | ICD-10-CM | POA: Diagnosis not present

## 2017-07-21 DIAGNOSIS — J440 Chronic obstructive pulmonary disease with acute lower respiratory infection: Secondary | ICD-10-CM | POA: Diagnosis not present

## 2017-07-21 DIAGNOSIS — I69391 Dysphagia following cerebral infarction: Secondary | ICD-10-CM | POA: Diagnosis not present

## 2017-07-21 DIAGNOSIS — I2510 Atherosclerotic heart disease of native coronary artery without angina pectoris: Secondary | ICD-10-CM | POA: Diagnosis not present

## 2017-07-21 DIAGNOSIS — I110 Hypertensive heart disease with heart failure: Secondary | ICD-10-CM | POA: Diagnosis not present

## 2017-07-21 DIAGNOSIS — I5023 Acute on chronic systolic (congestive) heart failure: Secondary | ICD-10-CM | POA: Diagnosis not present

## 2017-07-22 DIAGNOSIS — I69391 Dysphagia following cerebral infarction: Secondary | ICD-10-CM | POA: Diagnosis not present

## 2017-07-22 DIAGNOSIS — I2510 Atherosclerotic heart disease of native coronary artery without angina pectoris: Secondary | ICD-10-CM | POA: Diagnosis not present

## 2017-07-22 DIAGNOSIS — J440 Chronic obstructive pulmonary disease with acute lower respiratory infection: Secondary | ICD-10-CM | POA: Diagnosis not present

## 2017-07-22 DIAGNOSIS — I5023 Acute on chronic systolic (congestive) heart failure: Secondary | ICD-10-CM | POA: Diagnosis not present

## 2017-07-22 DIAGNOSIS — J181 Lobar pneumonia, unspecified organism: Secondary | ICD-10-CM | POA: Diagnosis not present

## 2017-07-22 DIAGNOSIS — I110 Hypertensive heart disease with heart failure: Secondary | ICD-10-CM | POA: Diagnosis not present

## 2017-07-23 DIAGNOSIS — I110 Hypertensive heart disease with heart failure: Secondary | ICD-10-CM | POA: Diagnosis not present

## 2017-07-23 DIAGNOSIS — J181 Lobar pneumonia, unspecified organism: Secondary | ICD-10-CM | POA: Diagnosis not present

## 2017-07-23 DIAGNOSIS — J440 Chronic obstructive pulmonary disease with acute lower respiratory infection: Secondary | ICD-10-CM | POA: Diagnosis not present

## 2017-07-23 DIAGNOSIS — I5023 Acute on chronic systolic (congestive) heart failure: Secondary | ICD-10-CM | POA: Diagnosis not present

## 2017-07-23 DIAGNOSIS — I69391 Dysphagia following cerebral infarction: Secondary | ICD-10-CM | POA: Diagnosis not present

## 2017-07-23 DIAGNOSIS — I2510 Atherosclerotic heart disease of native coronary artery without angina pectoris: Secondary | ICD-10-CM | POA: Diagnosis not present

## 2017-07-26 DIAGNOSIS — I5023 Acute on chronic systolic (congestive) heart failure: Secondary | ICD-10-CM | POA: Diagnosis not present

## 2017-07-26 DIAGNOSIS — I2510 Atherosclerotic heart disease of native coronary artery without angina pectoris: Secondary | ICD-10-CM | POA: Diagnosis not present

## 2017-07-26 DIAGNOSIS — J181 Lobar pneumonia, unspecified organism: Secondary | ICD-10-CM | POA: Diagnosis not present

## 2017-07-26 DIAGNOSIS — J440 Chronic obstructive pulmonary disease with acute lower respiratory infection: Secondary | ICD-10-CM | POA: Diagnosis not present

## 2017-07-26 DIAGNOSIS — I69391 Dysphagia following cerebral infarction: Secondary | ICD-10-CM | POA: Diagnosis not present

## 2017-07-26 DIAGNOSIS — I110 Hypertensive heart disease with heart failure: Secondary | ICD-10-CM | POA: Diagnosis not present

## 2017-07-27 DIAGNOSIS — J440 Chronic obstructive pulmonary disease with acute lower respiratory infection: Secondary | ICD-10-CM | POA: Diagnosis not present

## 2017-07-27 DIAGNOSIS — I69391 Dysphagia following cerebral infarction: Secondary | ICD-10-CM | POA: Diagnosis not present

## 2017-07-27 DIAGNOSIS — I110 Hypertensive heart disease with heart failure: Secondary | ICD-10-CM | POA: Diagnosis not present

## 2017-07-27 DIAGNOSIS — I5023 Acute on chronic systolic (congestive) heart failure: Secondary | ICD-10-CM | POA: Diagnosis not present

## 2017-07-27 DIAGNOSIS — J181 Lobar pneumonia, unspecified organism: Secondary | ICD-10-CM | POA: Diagnosis not present

## 2017-07-27 DIAGNOSIS — I2510 Atherosclerotic heart disease of native coronary artery without angina pectoris: Secondary | ICD-10-CM | POA: Diagnosis not present

## 2017-07-28 DIAGNOSIS — I2510 Atherosclerotic heart disease of native coronary artery without angina pectoris: Secondary | ICD-10-CM | POA: Diagnosis not present

## 2017-07-28 DIAGNOSIS — I69391 Dysphagia following cerebral infarction: Secondary | ICD-10-CM | POA: Diagnosis not present

## 2017-07-28 DIAGNOSIS — I5023 Acute on chronic systolic (congestive) heart failure: Secondary | ICD-10-CM | POA: Diagnosis not present

## 2017-07-28 DIAGNOSIS — J181 Lobar pneumonia, unspecified organism: Secondary | ICD-10-CM | POA: Diagnosis not present

## 2017-07-28 DIAGNOSIS — I110 Hypertensive heart disease with heart failure: Secondary | ICD-10-CM | POA: Diagnosis not present

## 2017-07-28 DIAGNOSIS — J440 Chronic obstructive pulmonary disease with acute lower respiratory infection: Secondary | ICD-10-CM | POA: Diagnosis not present

## 2017-07-29 DIAGNOSIS — I110 Hypertensive heart disease with heart failure: Secondary | ICD-10-CM | POA: Diagnosis not present

## 2017-07-29 DIAGNOSIS — I2510 Atherosclerotic heart disease of native coronary artery without angina pectoris: Secondary | ICD-10-CM | POA: Diagnosis not present

## 2017-07-29 DIAGNOSIS — J440 Chronic obstructive pulmonary disease with acute lower respiratory infection: Secondary | ICD-10-CM | POA: Diagnosis not present

## 2017-07-29 DIAGNOSIS — J181 Lobar pneumonia, unspecified organism: Secondary | ICD-10-CM | POA: Diagnosis not present

## 2017-07-29 DIAGNOSIS — I69391 Dysphagia following cerebral infarction: Secondary | ICD-10-CM | POA: Diagnosis not present

## 2017-07-29 DIAGNOSIS — I5023 Acute on chronic systolic (congestive) heart failure: Secondary | ICD-10-CM | POA: Diagnosis not present

## 2017-07-30 DIAGNOSIS — I2510 Atherosclerotic heart disease of native coronary artery without angina pectoris: Secondary | ICD-10-CM | POA: Diagnosis not present

## 2017-07-30 DIAGNOSIS — J440 Chronic obstructive pulmonary disease with acute lower respiratory infection: Secondary | ICD-10-CM | POA: Diagnosis not present

## 2017-07-30 DIAGNOSIS — I69391 Dysphagia following cerebral infarction: Secondary | ICD-10-CM | POA: Diagnosis not present

## 2017-07-30 DIAGNOSIS — J181 Lobar pneumonia, unspecified organism: Secondary | ICD-10-CM | POA: Diagnosis not present

## 2017-07-30 DIAGNOSIS — I5023 Acute on chronic systolic (congestive) heart failure: Secondary | ICD-10-CM | POA: Diagnosis not present

## 2017-07-30 DIAGNOSIS — I110 Hypertensive heart disease with heart failure: Secondary | ICD-10-CM | POA: Diagnosis not present

## 2017-08-03 DIAGNOSIS — I110 Hypertensive heart disease with heart failure: Secondary | ICD-10-CM | POA: Diagnosis not present

## 2017-08-03 DIAGNOSIS — I5023 Acute on chronic systolic (congestive) heart failure: Secondary | ICD-10-CM | POA: Diagnosis not present

## 2017-08-03 DIAGNOSIS — J440 Chronic obstructive pulmonary disease with acute lower respiratory infection: Secondary | ICD-10-CM | POA: Diagnosis not present

## 2017-08-03 DIAGNOSIS — J181 Lobar pneumonia, unspecified organism: Secondary | ICD-10-CM | POA: Diagnosis not present

## 2017-08-03 DIAGNOSIS — I69391 Dysphagia following cerebral infarction: Secondary | ICD-10-CM | POA: Diagnosis not present

## 2017-08-03 DIAGNOSIS — I2510 Atherosclerotic heart disease of native coronary artery without angina pectoris: Secondary | ICD-10-CM | POA: Diagnosis not present

## 2017-08-04 DIAGNOSIS — J440 Chronic obstructive pulmonary disease with acute lower respiratory infection: Secondary | ICD-10-CM | POA: Diagnosis not present

## 2017-08-04 DIAGNOSIS — I5023 Acute on chronic systolic (congestive) heart failure: Secondary | ICD-10-CM | POA: Diagnosis not present

## 2017-08-04 DIAGNOSIS — J181 Lobar pneumonia, unspecified organism: Secondary | ICD-10-CM | POA: Diagnosis not present

## 2017-08-04 DIAGNOSIS — I2510 Atherosclerotic heart disease of native coronary artery without angina pectoris: Secondary | ICD-10-CM | POA: Diagnosis not present

## 2017-08-04 DIAGNOSIS — I69391 Dysphagia following cerebral infarction: Secondary | ICD-10-CM | POA: Diagnosis not present

## 2017-08-04 DIAGNOSIS — I110 Hypertensive heart disease with heart failure: Secondary | ICD-10-CM | POA: Diagnosis not present

## 2017-08-05 DIAGNOSIS — J181 Lobar pneumonia, unspecified organism: Secondary | ICD-10-CM | POA: Diagnosis not present

## 2017-08-05 DIAGNOSIS — J440 Chronic obstructive pulmonary disease with acute lower respiratory infection: Secondary | ICD-10-CM | POA: Diagnosis not present

## 2017-08-05 DIAGNOSIS — I5023 Acute on chronic systolic (congestive) heart failure: Secondary | ICD-10-CM | POA: Diagnosis not present

## 2017-08-05 DIAGNOSIS — I110 Hypertensive heart disease with heart failure: Secondary | ICD-10-CM | POA: Diagnosis not present

## 2017-08-05 DIAGNOSIS — I69391 Dysphagia following cerebral infarction: Secondary | ICD-10-CM | POA: Diagnosis not present

## 2017-08-05 DIAGNOSIS — I2510 Atherosclerotic heart disease of native coronary artery without angina pectoris: Secondary | ICD-10-CM | POA: Diagnosis not present

## 2017-08-09 ENCOUNTER — Emergency Department (HOSPITAL_COMMUNITY)
Admission: EM | Admit: 2017-08-09 | Discharge: 2017-08-11 | Disposition: A | Discharge status: Other Acute Inpatient Hospital | Payer: Medicare Other | Attending: Emergency Medicine | Admitting: Emergency Medicine

## 2017-08-09 ENCOUNTER — Ambulatory Visit (INDEPENDENT_AMBULATORY_CARE_PROVIDER_SITE_OTHER): Payer: Medicare Other | Admitting: *Deleted

## 2017-08-09 ENCOUNTER — Encounter (HOSPITAL_COMMUNITY): Payer: Self-pay | Admitting: Emergency Medicine

## 2017-08-09 DIAGNOSIS — Z79899 Other long term (current) drug therapy: Secondary | ICD-10-CM | POA: Diagnosis not present

## 2017-08-09 DIAGNOSIS — F0390 Unspecified dementia without behavioral disturbance: Secondary | ICD-10-CM | POA: Insufficient documentation

## 2017-08-09 DIAGNOSIS — J449 Chronic obstructive pulmonary disease, unspecified: Secondary | ICD-10-CM | POA: Insufficient documentation

## 2017-08-09 DIAGNOSIS — I10 Essential (primary) hypertension: Secondary | ICD-10-CM | POA: Diagnosis not present

## 2017-08-09 DIAGNOSIS — Z87891 Personal history of nicotine dependence: Secondary | ICD-10-CM | POA: Diagnosis not present

## 2017-08-09 DIAGNOSIS — R451 Restlessness and agitation: Secondary | ICD-10-CM | POA: Insufficient documentation

## 2017-08-09 DIAGNOSIS — Z951 Presence of aortocoronary bypass graft: Secondary | ICD-10-CM | POA: Diagnosis not present

## 2017-08-09 DIAGNOSIS — I495 Sick sinus syndrome: Secondary | ICD-10-CM | POA: Diagnosis not present

## 2017-08-09 DIAGNOSIS — I2510 Atherosclerotic heart disease of native coronary artery without angina pectoris: Secondary | ICD-10-CM | POA: Insufficient documentation

## 2017-08-09 DIAGNOSIS — F22 Delusional disorders: Secondary | ICD-10-CM | POA: Diagnosis not present

## 2017-08-09 DIAGNOSIS — R4182 Altered mental status, unspecified: Secondary | ICD-10-CM | POA: Insufficient documentation

## 2017-08-09 DIAGNOSIS — R059 Cough, unspecified: Secondary | ICD-10-CM

## 2017-08-09 DIAGNOSIS — R609 Edema, unspecified: Secondary | ICD-10-CM | POA: Insufficient documentation

## 2017-08-09 DIAGNOSIS — R05 Cough: Secondary | ICD-10-CM

## 2017-08-09 DIAGNOSIS — Z7982 Long term (current) use of aspirin: Secondary | ICD-10-CM | POA: Insufficient documentation

## 2017-08-09 DIAGNOSIS — Z046 Encounter for general psychiatric examination, requested by authority: Secondary | ICD-10-CM | POA: Diagnosis not present

## 2017-08-09 DIAGNOSIS — Z8546 Personal history of malignant neoplasm of prostate: Secondary | ICD-10-CM | POA: Insufficient documentation

## 2017-08-09 DIAGNOSIS — Z950 Presence of cardiac pacemaker: Secondary | ICD-10-CM | POA: Insufficient documentation

## 2017-08-09 DIAGNOSIS — I499 Cardiac arrhythmia, unspecified: Secondary | ICD-10-CM | POA: Diagnosis not present

## 2017-08-09 LAB — CBC WITH DIFFERENTIAL/PLATELET
BASOS ABS: 0 10*3/uL (ref 0.0–0.1)
BASOS PCT: 0 %
EOS ABS: 0.1 10*3/uL (ref 0.0–0.7)
EOS PCT: 2 %
HCT: 42 % (ref 39.0–52.0)
Hemoglobin: 12.6 g/dL — ABNORMAL LOW (ref 13.0–17.0)
Lymphocytes Relative: 26 %
Lymphs Abs: 1.8 10*3/uL (ref 0.7–4.0)
MCH: 28.6 pg (ref 26.0–34.0)
MCHC: 30 g/dL (ref 30.0–36.0)
MCV: 95.2 fL (ref 78.0–100.0)
Monocytes Absolute: 0.7 10*3/uL (ref 0.1–1.0)
Monocytes Relative: 11 %
Neutro Abs: 4.3 10*3/uL (ref 1.7–7.7)
Neutrophils Relative %: 61 %
PLATELETS: 192 10*3/uL (ref 150–400)
RBC: 4.41 MIL/uL (ref 4.22–5.81)
RDW: 14.6 % (ref 11.5–15.5)
WBC: 6.9 10*3/uL (ref 4.0–10.5)

## 2017-08-09 LAB — URINALYSIS, ROUTINE W REFLEX MICROSCOPIC
Bilirubin Urine: NEGATIVE
GLUCOSE, UA: NEGATIVE mg/dL
HGB URINE DIPSTICK: NEGATIVE
Ketones, ur: NEGATIVE mg/dL
Leukocytes, UA: NEGATIVE
NITRITE: NEGATIVE
PH: 7 (ref 5.0–8.0)
PROTEIN: 30 mg/dL — AB
Specific Gravity, Urine: 1.016 (ref 1.005–1.030)
WBC, UA: NONE SEEN WBC/hpf (ref 0–5)

## 2017-08-09 LAB — COMPREHENSIVE METABOLIC PANEL
ALT: 13 U/L — AB (ref 17–63)
AST: 16 U/L (ref 15–41)
Albumin: 3.7 g/dL (ref 3.5–5.0)
Alkaline Phosphatase: 82 U/L (ref 38–126)
Anion gap: 9 (ref 5–15)
BUN: 21 mg/dL — AB (ref 6–20)
CHLORIDE: 109 mmol/L (ref 101–111)
CO2: 27 mmol/L (ref 22–32)
CREATININE: 1 mg/dL (ref 0.61–1.24)
Calcium: 9.1 mg/dL (ref 8.9–10.3)
GFR calc Af Amer: >60 mL/min (ref >60)
GFR calc non Af Amer: >60 mL/min (ref >60)
Glucose, Bld: 105 mg/dL — ABNORMAL HIGH (ref 65–99)
Potassium: 3.4 mmol/L — ABNORMAL LOW (ref 3.5–5.1)
SODIUM: 145 mmol/L (ref 135–145)
Total Bilirubin: 0.5 mg/dL (ref 0.3–1.2)
Total Protein: 6.8 g/dL (ref 6.5–8.1)

## 2017-08-09 LAB — RAPID URINE DRUG SCREEN, HOSP PERFORMED
AMPHETAMINES: NOT DETECTED
BENZODIAZEPINES: NOT DETECTED
Barbiturates: NOT DETECTED
Cocaine: NOT DETECTED
OPIATES: NOT DETECTED
TETRAHYDROCANNABINOL: NOT DETECTED

## 2017-08-09 LAB — ETHANOL: Alcohol, Ethyl (B): <10 mg/dL (ref <10)

## 2017-08-09 MED ORDER — TOPIRAMATE 25 MG PO TABS
50.0000 mg | ORAL_TABLET | Freq: Two times a day (BID) | ORAL | Status: DC
  Administered 2017-08-09 – 2017-08-11 (×4): 50 mg via ORAL
  Filled 2017-08-09 (×8): qty 2

## 2017-08-09 MED ORDER — FLUOXETINE HCL 20 MG PO CAPS
40.0000 mg | ORAL_CAPSULE | Freq: Every day | ORAL | Status: DC
  Administered 2017-08-09 – 2017-08-11 (×3): 40 mg via ORAL
  Filled 2017-08-09 (×3): qty 2

## 2017-08-09 MED ORDER — POTASSIUM CHLORIDE CRYS ER 20 MEQ PO TBCR
10.0000 meq | EXTENDED_RELEASE_TABLET | Freq: Every day | ORAL | Status: DC
  Administered 2017-08-10 – 2017-08-11 (×2): 10 meq via ORAL
  Filled 2017-08-09 (×4): qty 1

## 2017-08-09 MED ORDER — LORAZEPAM 0.5 MG PO TABS
0.5000 mg | ORAL_TABLET | Freq: Every day | ORAL | Status: DC
  Administered 2017-08-09 – 2017-08-10 (×2): 0.5 mg via ORAL
  Filled 2017-08-09 (×2): qty 1

## 2017-08-09 MED ORDER — PANTOPRAZOLE SODIUM 40 MG PO TBEC
40.0000 mg | DELAYED_RELEASE_TABLET | Freq: Two times a day (BID) | ORAL | Status: DC
  Administered 2017-08-10 – 2017-08-11 (×3): 40 mg via ORAL
  Filled 2017-08-09 (×3): qty 1

## 2017-08-09 MED ORDER — FUROSEMIDE 40 MG PO TABS
20.0000 mg | ORAL_TABLET | Freq: Every day | ORAL | Status: DC
  Administered 2017-08-09 – 2017-08-11 (×3): 20 mg via ORAL
  Filled 2017-08-09 (×3): qty 1

## 2017-08-09 MED ORDER — OMEPRAZOLE MAGNESIUM 20 MG PO TBEC: 20.0000 mg | DELAYED_RELEASE_TABLET | Freq: Two times a day (BID) | ORAL | Status: DC

## 2017-08-09 MED ORDER — DILTIAZEM HCL ER COATED BEADS 180 MG PO CP24
180.0000 mg | ORAL_CAPSULE | Freq: Two times a day (BID) | ORAL | Status: DC
  Administered 2017-08-09 – 2017-08-11 (×4): 180 mg via ORAL
  Filled 2017-08-09 (×9): qty 1

## 2017-08-09 MED ORDER — METOPROLOL TARTRATE 25 MG PO TABS
12.5000 mg | ORAL_TABLET | Freq: Two times a day (BID) | ORAL | Status: DC
  Administered 2017-08-09 – 2017-08-11 (×4): 12.5 mg via ORAL
  Filled 2017-08-09 (×4): qty 1

## 2017-08-09 NOTE — ED Notes (Signed)
ED Provider at bedside. 

## 2017-08-09 NOTE — [facility] Counselor (Addendum)
Per Patriciaann Clan, PA-C: Patient meets criteria for Geriatric Psychiatry Inpatient Treatment. TTS to seek placement.  AP Attending Provider, Lita Mains, MD,notified at 2202 and Ginger,RN at 2204.

## 2017-08-09 NOTE — ED Provider Notes (Signed)
[facility] EMERGENCY DEPARTMENT Provider Note   CSN: [id number] Arrival date & time: 08/09/17  1734     History   Chief Complaint Chief Complaint  Patient presents with  . Altered Mental Status    HPI Zachary Warren is a 83 y.o. male.  HPI Level 5 caveat due to her dementia.  Patient is brought in by wife for delusional thoughts and increased agitation this afternoon.  Wife states the patient woke up from a nap claiming that 3 men were going to come and get him today.  Would not specify details.  He thinks that he was going for a drive.  Wife states patient has not driven in years.  She drove him around the neighborhood and when decided to go home patient became upset, took off his seatbelt and open the car door.  She states she spit up and the patient closed a car door but continued to make threats about getting out of the car.  Patient is now calm.  Denies any discomfort at this time. Past Medical History:  Diagnosis Date  . COPD (chronic obstructive pulmonary disease) ([place])   . Coronary artery disease    s/p 5v CABG 2002  . CVA (cerebral infarction) 1999  . DDD (degenerative disc disease), cervical   . Dementia   . Depression   . GERD (gastroesophageal reflux disease)   . History of kidney stones   . Hyperlipidemia   . Hypertension   . Memory impairment   . OSA (obstructive sleep apnea)    Recently diagnosed though pt. has not had CPAP trial  . Pacemaker   . Persistent atrial fibrillation ([place])    on pradaxa  . Pneumonia    recent hospital admission for pneumonia   . Prostate cancer ([place]) 2006   s/p XRT  . PVD (peripheral vascular disease) ([place])   . Seizures ([place])   . Tachycardia-bradycardia [facility])    s/p PPM by [facility] 6/12    Patient Active Problem List   Diagnosis Date Noted  . LLL pneumonia ([place]) 07/11/2017  . HCAP (healthcare-associated pneumonia) 07/11/2017  . Fever 07/10/2017  . Hypokalemia 10/25/2014  . Sepsis ([place]) 10/24/2014  . Atrial fibrillation  with RVR ([place]) 10/24/2014  . MCI (mild cognitive impairment) with memory loss 07/24/2014  . Cardiac pacemaker in situ 12/25/2013  . Aneurysm ([place]) 06/28/2012  . Encephalopathy acute 02/05/2012  . CVA (cerebral infarction) 02/05/2012  . Seizure ([place]) 02/05/2012  . Dysphagia as late effect of stroke 02/05/2012  . GERD (gastroesophageal reflux disease) 02/05/2012  . 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 Hyperlipidemia 11/21/2010  . Essential hypertension 11/21/2010  . CAD, NATIVE VESSEL 11/21/2010  . Atrial fibrillation ([place]) 11/21/2010  . PVD 11/21/2010    Past Surgical History:  Procedure Laterality Date  . CARDIOVERSION N/A 08/21/2013   Procedure: CARDIOVERSION;  Surgeon: Candee Furbish, MD;  Location: [place];  Service: Cardiovascular;  Laterality: N/A;  . [place]  . CORONARY ARTERY BYPASS GRAFT  2002  . CYSTOSCOPY W/ URETERAL STENT PLACEMENT Left 05/13/2017   Procedure: CYSTOSCOPY WITH RETROGRADE PYELOGRAM/URETERAL STENT PLACEMENT LEFT;  Surgeon: Alexis Frock, MD;  [facility];  Service: Urology;  Laterality: Left;  . CYSTOSCOPY WITH RETROGRADE PYELOGRAM, URETEROSCOPY AND STENT PLACEMENT Left 06/09/2017   Procedure: CYSTOSCOPY WITH RETROGRADE PYELOGRAM, URETEROSCOPY;  Surgeon: Alexis Frock, MD;  [facility];  Service: Urology;  Laterality: Left;  . HOLMIUM LASER APPLICATION Left 01/25/6062   Procedure: HOLMIUM LASER APPLICATION;  Surgeon: Alexis Frock, MD;  [facility];  Service: Urology;  Laterality: Left;  . INSERT / REPLACE / REMOVE PACEMAKER    . NECK SURGERY     hx of   . PACEMAKER INSERTION     implanted by [facility] 6/12       Home Medications    Prior to Admission medications   Medication Sig Start Date End Date Taking? Authorizing Provider  acetaminophen (TYLENOL) 500 MG tablet Take 500 mg by mouth daily as needed for mild pain or moderate pain.   Yes [provider]  aspirin EC 81 MG tablet Take 81 mg by mouth daily.   Yes  [provider]  atorvastatin (LIPITOR) 40 MG tablet TAKE 1 TABLET BY MOUTH EVERY DAY - PATIENT DUE FOR LIPID PANEL Patient taking differently: TAKE 1 TABLET BY MOUTH EVERY DAY EVERY EVENING - PATIENT DUE FOR LIPID PANEL 06/01/17  Yes Jerline Pain, MD  budesonide-formoterol (SYMBICORT) 80-4.5 MCG/ACT inhaler Inhale 2 puffs into the lungs daily.   Yes [provider]  calcium carbonate (TUMS - DOSED IN MG ELEMENTAL CALCIUM) 500 MG chewable tablet Chew 1 tablet by mouth 2 (two) times daily.   Yes [provider]  cholecalciferol (VITAMIN D) 1000 units tablet Take 1,000 Units by mouth daily.   Yes [provider]  diltiazem (CARDIZEM CD) 180 MG 24 hr capsule TAKE 1 CAPSULE BY MOUTH TWICE DAILY 09/17/16  Yes Allred, Jeneen Rinks, MD  FLUoxetine (PROZAC) 40 MG capsule Take 40 mg by mouth daily.     Yes [provider]  furosemide (LASIX) 20 MG tablet Take 2 tablets (40 mg total) by mouth daily. Patient taking differently: Take 20 mg by mouth daily.  07/13/17 08/12/17 Yes Johnson, Clanford L, MD  guaiFENesin (MUCINEX) 600 MG 12 hr tablet Take 600 mg by mouth 2 (two) times daily.   Yes [provider]  ipratropium-albuterol (DUONEB) 0.5-2.5 (3) MG/3ML SOLN Take 3 mLs by nebulization every 6 (six) hours. Patient taking differently: Take 3 mLs by nebulization daily.  07/13/17  Yes Johnson, Clanford L, MD  LORazepam (ATIVAN) 0.5 MG tablet Take 0.5 mg by mouth at bedtime. 07/16/17  Yes [provider]  metoprolol tartrate (LOPRESSOR) 25 MG tablet Take 0.5 tablets (12.5 mg total) by mouth 2 (two) times daily. 05/18/17  Yes Regalado, Belkys A, MD  omeprazole (PRILOSEC OTC) 20 MG tablet Take 20 mg by mouth 2 (two) times daily.     Yes [provider]  potassium chloride (K-DUR) 10 MEQ tablet Take 2 tablets (20 mEq total) by mouth daily. Patient taking differently: Take 10 mEq by mouth daily.  07/13/17 08/12/17 Yes Johnson, Clanford L, MD  topiramate  (TOPAMAX) 50 MG tablet Take 1 tablet (50 mg total) by mouth 2 (two) times daily. 07/25/15  Yes Penumalli, Earlean Polka, MD  traMADol (ULTRAM) 50 MG tablet Take 1 tablet (50 mg total) by mouth every 6 (six) hours as needed. For post-op pain. Patient not taking: Reported on 08/09/2017 06/09/17 06/09/18  Alexis Frock, MD    Family History Family History  Problem Relation Age of Onset  . Cancer Mother        ovarian  . Ovarian cancer Mother   . Cancer Father        pancreatic  . Pancreatic cancer Father   . Cancer Brother        lung  . Ovarian cancer Unknown   . Pancreatic cancer Unknown   . Lung cancer Unknown     Social  History Social History  Substance Use Topics  . Smoking status: Former Smoker    Packs/day: 2.00    Years: 48.00    Types: Cigarettes    Quit date: 03/09/1998  . Smokeless tobacco: Never Used  . Alcohol use No     Comment: quit: 1999     Allergies   Aricept [donepezil hcl]; Ceclor [cefaclor]; Namenda [memantine hcl]; Penicillins; and Ramipril   Review of Systems Review of Systems  Unable to perform ROS: Dementia     Physical Exam Updated Vital Signs BP 136/68 (BP Location: Right Arm)   Pulse 69   Temp 98.9 F (37.2 C) (Oral)   Resp 18   SpO2 95%   Physical Exam  Constitutional: He appears well-developed and well-nourished. No distress.  HENT:  Head: Normocephalic and atraumatic.  Mouth/Throat: Oropharynx is clear and moist.  Eyes: Pupils are equal, round, and reactive to light. EOM are normal.  Neck: Normal range of motion. Neck supple.  Cardiovascular: Normal rate and regular rhythm.  Exam reveals no gallop and no friction rub.   No murmur heard. Pulmonary/Chest: Effort normal and breath sounds normal.  Abdominal: Soft. Bowel sounds are normal. There is no tenderness. There is no rebound and no guarding.  Musculoskeletal: Normal range of motion. He exhibits edema. He exhibits no tenderness.  1+ pitting edema bilateral lower extremities.    Neurological: He is alert.  Oriented to person and year.  5/5 motor in all extremities.  Sensation fully intact.  Skin: Skin is warm and dry. Capillary refill takes less than 2 seconds. No rash noted. No erythema.  Psychiatric:  Flat affect, calm and cooperative  Nursing note and vitals reviewed.    ED Treatments / Results  Labs (all labs ordered are listed, but only abnormal results are displayed) Labs Reviewed  CBC WITH DIFFERENTIAL/PLATELET - Abnormal; Notable for the following:       Result Value   Hemoglobin 12.6 (*)    All other components within normal limits  COMPREHENSIVE METABOLIC PANEL - Abnormal; Notable for the following:    Potassium 3.4 (*)    Glucose, Bld 105 (*)    BUN 21 (*)    ALT 13 (*)    All other components within normal limits  URINALYSIS, ROUTINE W REFLEX MICROSCOPIC - Abnormal; Notable for the following:    APPearance CLOUDY (*)    Protein, ur 30 (*)    Bacteria, UA RARE (*)    Squamous Epithelial / LPF 0-5 (*)    All other components within normal limits  ETHANOL  RAPID URINE DRUG SCREEN, HOSP PERFORMED    EKG  EKG Interpretation  Date/Time:  Tuesday August 10 2017 12:06:10 EDT Ventricular Rate:  82 PR Interval:    QRS Duration: 102 QT Interval:  385 QTC Calculation: 466 R Axis:   62 Text Interpretation:  Afib/flut and V-paced complexes No further rhythm analysis attempted due to paced rhythm Borderline low voltage, extremity leads Borderline repolarization abnormality Baseline wander When compared with ECG of 07/10/2017 Electronic ventricular pacemaker complexes are now present Confirmed by Francine Graven [id number]) on 08/10/2017 12:10:31 PM       Radiology No results found.  Procedures Procedures (including critical care time)  Medications Ordered in ED Medications - No data to display   Initial Impression / Assessment and Plan / ED Course  I have reviewed the triage vital signs and the nursing notes.  Pertinent labs &  imaging results that were available during my care of the  patient were reviewed by me and considered in my medical decision making (see chart for details).     Patient is medically cleared for psychiatric evaluation.  Final Clinical Impressions(s) / ED Diagnoses   Final diagnoses:  Altered mental status, unspecified altered mental status type    New Prescriptions Discharge Medication List as of 08/11/2017 11:14 AM       Julianne Rice, MD 08/13/17 1324

## 2017-08-09 NOTE — BH Assessment (Signed)
Tele Assessment Note   Patient Name: Zachary Warren MRN: [id number] Referring Physician: Julianne Rice, MD Location of Patient: [facility] Location of Provider: [place] is an 83 y.o.married male, who was voluntarily brought into [facility], by his wife, Burlon Centrella.  Patient was oriented to person, however not the place, time, or situation.  Patient was able to identify his wife and described her as "A good looking woman." When asked about consent to speak with his wife to obtain collateral information, Patient reported "Yes."  Observable behaviors throughout assessment appeared to consist of memory less, delayed responses, difficulty community, and disorientation. Unable to assess, suicidal ideations, homicidal ideations, auditory/visual hallucinations, self-injurious behaviors, substance use, access to weapons, or depressive symptoms, due to Patient's mental state.    Per Wife, Norwood Quezada: Patient was diagnosed with Vascular Dementia approximately 4 years ago.  Patient currently sees Dr. Wenda Low at [facility] in [place], [place].   At approximately 1530 today, Patient stated that 3 unknown men were going to pick him up at 1700.  Later in the day, Patient demanded that his wife drive him around for about 1  hours.  During the drive, Patient became agitated and started waving his arms, yelling, and attempted to get out of the car to walk to the street.  Patient's wife was able to redirect him and transport him to [facility], where had to be escorted into the building.  Patient currently receives home health care, physical therapy, and occupational therapy.   Patient is able to be directed to complete ADLs, however uses a walker and shower chair. Patient has an ongoing medical history.     Diagnosis: Major Neurocognitive Disorder  Past Medical History:  Past Medical History:  Diagnosis Date  . COPD (chronic obstructive pulmonary disease) ([place])    . Coronary artery disease    s/p 5v CABG 2002  . CVA (cerebral infarction) 1999  . DDD (degenerative disc disease), cervical   . Dementia   . Depression   . GERD (gastroesophageal reflux disease)   . History of kidney stones   . Hyperlipidemia   . Hypertension   . Memory impairment   . OSA (obstructive sleep apnea)    Recently diagnosed though pt. has not had CPAP trial  . Pacemaker   . Persistent atrial fibrillation ([place])    on pradaxa  . Pneumonia    recent hospital admission for pneumonia   . Prostate cancer ([place]) 2006   s/p XRT  . PVD (peripheral vascular disease) ([place])   . Seizures ([place])   . Tachycardia-bradycardia [facility])    s/p PPM by [facility] 6/12    Past Surgical History:  Procedure Laterality Date  . CARDIOVERSION N/A 08/21/2013   Procedure: CARDIOVERSION;  Surgeon: Candee Furbish, MD;  Location: [place];  Service: Cardiovascular;  Laterality: N/A;  . [place]  . CORONARY ARTERY BYPASS GRAFT  2002  . CYSTOSCOPY W/ URETERAL STENT PLACEMENT Left 05/13/2017   Procedure: CYSTOSCOPY WITH RETROGRADE PYELOGRAM/URETERAL STENT PLACEMENT LEFT;  Surgeon: Alexis Frock, MD;  [facility];  Service: Urology;  Laterality: Left;  . CYSTOSCOPY WITH RETROGRADE PYELOGRAM, URETEROSCOPY AND STENT PLACEMENT Left 06/09/2017   Procedure: CYSTOSCOPY WITH RETROGRADE PYELOGRAM, URETEROSCOPY;  Surgeon: Alexis Frock, MD;  [facility];  Service: Urology;  Laterality: Left;  . HOLMIUM LASER APPLICATION Left 06/09/9370   Procedure: HOLMIUM LASER APPLICATION;  Surgeon: Alexis Frock, MD;  [facility];  Service: Urology;  Laterality:  Left;  . INSERT / REPLACE / REMOVE PACEMAKER    . NECK SURGERY     hx of   . PACEMAKER INSERTION     implanted by [facility] 6/12    Family History:  Family History  Problem Relation Age of Onset  . Cancer Mother        ovarian  . Ovarian cancer Mother   . Cancer Father        pancreatic  . Pancreatic cancer Father   . Cancer Brother        lung  .  Ovarian cancer Unknown   . Pancreatic cancer Unknown   . Lung cancer Unknown     Social History:  reports that he quit smoking about 19 years ago. His smoking use included Cigarettes. He has a 96.00 pack-year smoking history. He has never used smokeless tobacco. He reports that he does not drink alcohol or use drugs.  Additional Social History:  Alcohol / Drug Use Pain Medications: See MAR Prescriptions: See MAR Over the Counter: See MAR History of alcohol / drug use?: No history of alcohol / drug abuse Longest period of sobriety (when/how long): N/A  CIWA: CIWA-Ar BP: (!) 139/115 Pulse Rate: 88 COWS:    PATIENT STRENGTHS: (choose at least two) Other: Unable to assess due to Patient's mental state.  Allergies:  Allergies  Allergen Reactions  . Aricept [Donepezil Hcl] Other (See Comments)    "stomach pain", generic  . Ceclor [Cefaclor]   . Namenda [Memantine Hcl] Other (See Comments)    "dizzy", brand medication  . Penicillins     Has patient had a PCN reaction causing immediate rash, facial/tongue/throat swelling, SOB or lightheadedness with hypotension: Unknown Has patient had a PCN reaction causing severe rash involving mucus membranes or skin necrosis: Unknown Has patient had a PCN reaction that required hospitalization: Unknown Has patient had a PCN reaction occurring within the last 10 years: Unknown If all of the above answers are "NO", then may proceed with Cephalosporin use.   . Ramipril Other (See Comments)     cough    Home Medications:  (Not in a hospital admission)  OB/GYN Status:  No LMP for male patient.  General Assessment Data Location of Assessment: [facility] TTS Assessment: In system Is this a Tele or Face-to-Face Assessment?: Tele Assessment Is this an Initial Assessment or a Re-assessment for this encounter?: Initial Assessment Marital status: Married Is patient pregnant?: No Pregnancy Status: No Living Arrangements: Spouse/significant other Can  pt return to current living arrangement?: Yes Admission Status: Voluntary Is patient capable of signing voluntary admission?: Yes Referral Source: Self/Family/Friend Insurance type: Medicare     Crisis Care Plan Living Arrangements: Spouse/significant other Legal Guardian: Other: (Self) Name of Psychiatrist: None (Unable to assess. Per Wife.) Name of Therapist: None (Unable to assess. Per Wife.)  Education Status Is patient currently in school?: No Current Grade: N/A Highest grade of school patient has completed: Unable to assess. Name of school: N/A Contact person: N/A  Risk to self with the past 6 months Suicidal Ideation: No (Unable to assess. Per Wife.) Has patient been a risk to self within the past 6 months prior to admission? : Other (comment) (Unable to assess. Per Wife.) Suicidal Intent: No (Unable to assess. Per Wife.) Has patient had any suicidal intent within the past 6 months prior to admission? : Other (comment) (Unable to assess. Per Wife.) Is patient at risk for suicide?: No (Unable to assess. Per Wife.) Suicidal Plan?: No (Unable  to assess. Per Wife.) Has patient had any suicidal plan within the past 6 months prior to admission? : Other (comment) (Unable to assess. Per Wife.) Access to Means: No (Unable to assess. Per Wife.) What has been your use of drugs/alcohol within the last 12 months?: Unable to assess. Per Wife. Previous Attempts/Gestures: No (Unable to assess. Per Wife.) How many times?: 0 (Per wife.) Other Self Harm Risks: None noted Triggers for Past Attempts: None known Intentional Self Injurious Behavior: None Family Suicide History: Unable to assess Recent stressful life event(s):  (None noted.) Persecutory voices/beliefs?:  (Unable to assess.) Depression:  (Unable to assess.) Depression Symptoms:  (Unable to assess. ) Substance abuse history and/or treatment for substance abuse?:  (Unable to assess. ) Suicide prevention information given to  non-admitted patients: Not applicable  Risk to Others within the past 6 months Homicidal Ideation:  (Unable to assess. Per Wife.) Does patient have any lifetime risk of violence toward others beyond the six months prior to admission? : No (Unable to assess. Per Wife.) Thoughts of Harm to Others: No (Unable to assess. Per Wife.) Current Homicidal Intent: No (Unable to assess. Per Wife.) Current Homicidal Plan: No (Unable to assess. Per Wife.) Access to Homicidal Means: No (Unable to assess. Per Wife.) Identified Victim: None noted. History of harm to others?: No (Unable to assess. Per Wife.) Assessment of Violence: None Noted Violent Behavior Description: Unable to assess. Per Wife. Does patient have access to weapons?: No (Unable to assess. Per Wife.) Criminal Charges Pending?: No (Unable to assess. Per Wife.) Does patient have a court date: No (Unable to assess. Per Wife.) Is patient on probation?: No (Unable to assess. Per Wife.)  Psychosis Hallucinations:  (Unable to assess.) Delusions: Unspecified  Mental Status Report Appearance/Hygiene: In scrubs (Appropriately dressed in personal clothing.) Eye Contact: Poor Motor Activity: Unsteady, Tremors Speech: Slow, Soft, Incoherent Level of Consciousness: Quiet/awake Mood:  (Unable to assess.) Affect: Unable to Assess Anxiety Level:  (Unable to assess.) Thought Processes: Unable to Assess Judgement: Unable to Assess Orientation: Person Obsessive Compulsive Thoughts/Behaviors: Unable to Assess  Cognitive Functioning Concentration: Poor Memory: Unable to Assess IQ: Average Insight: Unable to Assess Impulse Control: Unable to Assess Appetite: Good (Per wife.) Weight Loss: 0 Weight Gain: 0 Sleep: No Change (Per wife.) Total Hours of Sleep: 10 Vegetative Symptoms: None (Per wife.)  ADLScreening [facility] Assessment Services) Patient's cognitive ability adequate to safely complete daily activities?: Yes Patient able to express  need for assistance with ADLs?: No (Per wife, Patient is directable.) Independently performs ADLs?: No  Prior Inpatient Therapy Prior Inpatient Therapy:  (Unable to assess. ) Prior Therapy Dates:  (Unable to assess. ) Prior Therapy Facilty/Provider(s):  (Unable to assess. ) Reason for Treatment:  (Unable to assess. )  Prior Outpatient Therapy Prior Outpatient Therapy:  (Unable to assess. ) Prior Therapy Dates:  (Unable to assess. ) Prior Therapy Facilty/Provider(s):  (Unable to assess. ) Reason for Treatment:  (Unable to assess. ) Does patient have an ACCT team?: No (Per wife.) Does patient have Intensive In-House Services?  : No Does patient have [facility] services? : No (Per wife.) Does patient have P4CC services?: No (Per wife.)  ADL Screening (condition at time of admission) Patient's cognitive ability adequate to safely complete daily activities?: Yes Is the patient deaf or have difficulty hearing?: No Does the patient have difficulty seeing, even when wearing glasses/contacts?: No Does the patient have difficulty concentrating, remembering, or making decisions?: Yes Patient able to express need for assistance with  ADLs?: No (Per wife, Patient is directable.) Does the patient have difficulty dressing or bathing?: Yes (Per wife, Patient is directable.) Independently performs ADLs?: No Communication: Needs assistance Dressing (OT): Dependent Grooming: Dependent Bathing: Dependent Toileting: Dependent In/Out Bed: Dependent Walks in Home: Dependent Does the patient have difficulty walking or climbing stairs?: Yes Weakness of Legs: Right Weakness of Arms/Hands: None  Home Assistive Devices/Equipment Home Assistive Devices/Equipment: Environmental consultant (specify type), Shower chair with back    Abuse/Neglect Assessment (Assessment to be complete while patient is alone) Physical Abuse: Denies (Per Wife) Verbal Abuse: Denies (Per Wife) Sexual Abuse: Denies (Per Wife) Exploitation of  patient/patient's resources: Denies (Per Wife) Self-Neglect: Denies (Per Wife)     Regulatory affairs officer (For Healthcare) Does Patient Have a Medical Advance Directive?:  (Unable to assess.) Type of Advance Directive:  (Unable to assess.) Copy of [place] in Chart?:  (Unable to assess.) Copy of Living Will in Chart?:  (Unable to assess.) Would patient like information on creating a medical advance directive?:  (Unable to assess. )    Additional Information 1:1 In Past 12 Months?: No CIRT Risk: No Elopement Risk: Yes (Per wife.) Does patient have medical clearance?: Yes     Disposition:  Disposition Initial Assessment Completed for this Encounter: Yes Disposition of Patient: Inpatient treatment program, Other dispositions (Per Patriciaann Clan, PA-C) Type of inpatient treatment program: Adult Other disposition(s): Other (Comment) (Geriatric Psychiatry Inpatient Treatment)  This service was provided via telemedicine using a 2-way, interactive audio and video technology.  Names of all persons participating in this telemedicine service and their role in this encounter. Name: Lorn Butcher Role: Wife    Marcine Matar 08/09/2017 10:00 PM

## 2017-08-09 NOTE — ED Triage Notes (Signed)
Pt wife states pt was speaking about men coming to get him today. Pt tried to leave home saying he was going to drive. Pt wife states pt got angry after driving around for over one hour and he tried to open car door to get out. Pt wife then brought him here for evaluation.

## 2017-08-10 ENCOUNTER — Emergency Department (HOSPITAL_COMMUNITY): Payer: Medicare Other

## 2017-08-10 DIAGNOSIS — R4182 Altered mental status, unspecified: Secondary | ICD-10-CM | POA: Diagnosis not present

## 2017-08-10 DIAGNOSIS — I499 Cardiac arrhythmia, unspecified: Secondary | ICD-10-CM | POA: Diagnosis not present

## 2017-08-10 LAB — CUP PACEART REMOTE DEVICE CHECK
Battery Impedance: 424 Ohm
Battery Remaining Longevity: 107 mo
Battery Voltage: 2.79 V
Implantable Lead Implant Date: 20120609
Implantable Lead Model: 5076
Implantable Pulse Generator Implant Date: 20120609
Lead Channel Impedance Value: 431 Ohm
Lead Channel Impedance Value: 67 Ohm
Lead Channel Pacing Threshold Pulse Width: 0.4 ms
Lead Channel Sensing Intrinsic Amplitude: 8 mV
Lead Channel Setting Pacing Amplitude: 2.5 V
MDC IDC LEAD IMPLANT DT: 20120609
MDC IDC LEAD LOCATION: 753859
MDC IDC LEAD LOCATION: 753860
MDC IDC MSMT LEADCHNL RV PACING THRESHOLD AMPLITUDE: 0.875 V
MDC IDC SESS DTM: 20181029200628
MDC IDC SET LEADCHNL RV PACING PULSEWIDTH: 0.4 ms
MDC IDC SET LEADCHNL RV SENSING SENSITIVITY: 2.8 mV
MDC IDC STAT BRADY RV PERCENT PACED: 25 %

## 2017-08-10 NOTE — Progress Notes (Signed)
Pt's EKG and most recent vitals faxed to [facility] .  Areatha Keas. Judi Cong, MSW, [place] Disposition Clinical Social Work [phone] (cell) [phone] (office)

## 2017-08-10 NOTE — Progress Notes (Signed)
Remote pacemaker transmission.   

## 2017-08-10 NOTE — Progress Notes (Signed)
Zachary Warren is reviewing the patient for possible admission. Intake Coordinator, Shirlee Limerick is requesting the patient's EKG and Chest Xray results.   CSW contacted the patient's nurse to inform her that the patient would need orders for an EKG and Chest Xray for a placement review.    Angelina Pih, RN notified.    CSW will fax EKG and Chest Xray results to [facility] for review, once completed. CSW will continue to follow.    Radonna Ricker MSW, [place] Disposition [phone]

## 2017-08-10 NOTE — ED Notes (Signed)
Pt ambulated with NT to bathroom

## 2017-08-10 NOTE — BH Assessment (Signed)
[place] Assessment Progress Note  Felicia from [facility] called to decline pt due to not meeting exclusion criteria.   Kenna Gilbert. Lovena Le, Brenda, Guy, LPCA Counselor

## 2017-08-10 NOTE — ED Notes (Signed)
Wife and daughter expressing concern about psych hold and diagnosis. Called [facility] and spoke with counselor. Pt will be re-evaluated this morning per [facility]

## 2017-08-10 NOTE — Progress Notes (Signed)
CSW spoke with pt's son, Quran, who was visiting his father in the [facility] ED, regarding HCPOA.  He states that pt's wife, Emerson Schreifels, is his HCPOA.  CSW contacted [name] by phone and she confirms that she is his HCPOA,  has the HCPOA documents and will bring them tomorrow to the hospital..   CSW spoke with Saddie Benders for [place] gero-psych unit.  Pt is accepted to [facility] for admission tomorrow morning.  Bed and accepting provider information to be provided upon receipt of copy of patient's HCPOA doc and consents for treatment.  Shirlee Limerick will send consent forms to [facility] ED for his wife to sign.  [facility] ED Nurse to fax documents, along with a copy of patient's HCPOA documents, back to [place] gero-psych @ [phone]   [facility] ED Nurse, Orlie Dakin, notified and will pass information on to the nurse coming in for the 7P-7A shift.

## 2017-08-10 NOTE — ED Notes (Signed)
[place] called . Pt being reviewed by [place]. Need CXR and EKG at this time

## 2017-08-10 NOTE — ED Notes (Signed)
Pt is eating a meal tray now

## 2017-08-10 NOTE — ED Notes (Signed)
Tech bathing pt at this time

## 2017-08-10 NOTE — ED Notes (Signed)
Pt is eating a lunch tray

## 2017-08-10 NOTE — Progress Notes (Signed)
Patient currently being reviewed at [facility] for inpatient geriatric- psychiatric treatment.   Disposition CSW will continue to follow for a decision.   Radonna Ricker MSW, [place] Disposition [phone]

## 2017-08-10 NOTE — BHH Counselor (Signed)
Referrals for Geriatric Psychiatry Inpatient Treatment sent to:  [place], [place], [place], [place], [place], [facility], [place], [place], [place], [place], [place].

## 2017-08-10 NOTE — BH Assessment (Signed)
[place] Assessment Progress Note  Pt reassessed this afternoon. Pt said his name, struggled to remember his birthdate. His daughter, Johnsie Cancel, in the room with him. Pt not able to recall why he's in the hospital. He said twice, "My wife accused me, several times, of treating her wrong". 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 She reports that the family is amenable to IP treatment, but were not interested in long term placement for pt. Clinician assured daughter that crisis stabilization is all that is recommended. Daughter is aware that [facility] possibly interested in accepted pt. Advised daughter that if, for some reason, [facility] is unable to accept pt, he could possibly be re-assessed for d/c tomorrow. Daughter expresses no concerns with this plan. It is noted that pt's wife, Depaul Arizpe, is pt's healthcare and durable POA. She was not present for the reassessment.  Kenna Gilbert. Lovena Le, [place], [place], LPCA Counselor

## 2017-08-10 NOTE — ED Notes (Signed)
Pt ambulated to BR with assistance.

## 2017-08-10 NOTE — ED Notes (Signed)
Pt given bath and new scrubs

## 2017-08-10 NOTE — ED Notes (Signed)
Wife is Healthcare POA.  Documents will be sent here for wife to sign tomorrow. Both documents need to be faxed back to [facility] tomorrow.

## 2017-08-11 DIAGNOSIS — G9389 Other specified disorders of brain: Secondary | ICD-10-CM | POA: Diagnosis not present

## 2017-08-11 DIAGNOSIS — I50812 Chronic right heart failure: Secondary | ICD-10-CM | POA: Diagnosis not present

## 2017-08-11 DIAGNOSIS — Z880 Allergy status to penicillin: Secondary | ICD-10-CM | POA: Diagnosis not present

## 2017-08-11 DIAGNOSIS — I517 Cardiomegaly: Secondary | ICD-10-CM | POA: Diagnosis not present

## 2017-08-11 DIAGNOSIS — E785 Hyperlipidemia, unspecified: Secondary | ICD-10-CM | POA: Diagnosis present

## 2017-08-11 DIAGNOSIS — F0391 Unspecified dementia with behavioral disturbance: Secondary | ICD-10-CM | POA: Diagnosis present

## 2017-08-11 DIAGNOSIS — J9811 Atelectasis: Secondary | ICD-10-CM | POA: Diagnosis not present

## 2017-08-11 DIAGNOSIS — R05 Cough: Secondary | ICD-10-CM | POA: Diagnosis not present

## 2017-08-11 DIAGNOSIS — Z515 Encounter for palliative care: Secondary | ICD-10-CM | POA: Diagnosis not present

## 2017-08-11 DIAGNOSIS — I110 Hypertensive heart disease with heart failure: Secondary | ICD-10-CM | POA: Diagnosis present

## 2017-08-11 DIAGNOSIS — R918 Other nonspecific abnormal finding of lung field: Secondary | ICD-10-CM | POA: Diagnosis not present

## 2017-08-11 DIAGNOSIS — R1310 Dysphagia, unspecified: Secondary | ICD-10-CM | POA: Diagnosis not present

## 2017-08-11 DIAGNOSIS — I10 Essential (primary) hypertension: Secondary | ICD-10-CM | POA: Diagnosis not present

## 2017-08-11 DIAGNOSIS — Z7189 Other specified counseling: Secondary | ICD-10-CM | POA: Diagnosis not present

## 2017-08-11 DIAGNOSIS — I5032 Chronic diastolic (congestive) heart failure: Secondary | ICD-10-CM | POA: Diagnosis present

## 2017-08-11 DIAGNOSIS — Z981 Arthrodesis status: Secondary | ICD-10-CM | POA: Diagnosis not present

## 2017-08-11 DIAGNOSIS — R600 Localized edema: Secondary | ICD-10-CM | POA: Diagnosis not present

## 2017-08-11 DIAGNOSIS — M19012 Primary osteoarthritis, left shoulder: Secondary | ICD-10-CM | POA: Diagnosis not present

## 2017-08-11 DIAGNOSIS — E876 Hypokalemia: Secondary | ICD-10-CM | POA: Diagnosis present

## 2017-08-11 DIAGNOSIS — M25512 Pain in left shoulder: Secondary | ICD-10-CM | POA: Diagnosis not present

## 2017-08-11 DIAGNOSIS — G4733 Obstructive sleep apnea (adult) (pediatric): Secondary | ICD-10-CM | POA: Diagnosis present

## 2017-08-11 DIAGNOSIS — E559 Vitamin D deficiency, unspecified: Secondary | ICD-10-CM | POA: Diagnosis present

## 2017-08-11 DIAGNOSIS — I2510 Atherosclerotic heart disease of native coronary artery without angina pectoris: Secondary | ICD-10-CM | POA: Diagnosis present

## 2017-08-11 DIAGNOSIS — F0390 Unspecified dementia without behavioral disturbance: Secondary | ICD-10-CM | POA: Diagnosis not present

## 2017-08-11 DIAGNOSIS — R4182 Altered mental status, unspecified: Secondary | ICD-10-CM | POA: Diagnosis not present

## 2017-08-11 DIAGNOSIS — J690 Pneumonitis due to inhalation of food and vomit: Secondary | ICD-10-CM | POA: Diagnosis not present

## 2017-08-11 DIAGNOSIS — R451 Restlessness and agitation: Secondary | ICD-10-CM | POA: Diagnosis not present

## 2017-08-11 DIAGNOSIS — I4581 Long QT syndrome: Secondary | ICD-10-CM | POA: Diagnosis present

## 2017-08-11 DIAGNOSIS — J432 Centrilobular emphysema: Secondary | ICD-10-CM | POA: Diagnosis not present

## 2017-08-11 DIAGNOSIS — Z888 Allergy status to other drugs, medicaments and biological substances status: Secondary | ICD-10-CM | POA: Diagnosis not present

## 2017-08-11 DIAGNOSIS — J810 Acute pulmonary edema: Secondary | ICD-10-CM | POA: Diagnosis not present

## 2017-08-11 DIAGNOSIS — Z87891 Personal history of nicotine dependence: Secondary | ICD-10-CM | POA: Diagnosis not present

## 2017-08-11 DIAGNOSIS — Z950 Presence of cardiac pacemaker: Secondary | ICD-10-CM | POA: Diagnosis not present

## 2017-08-11 DIAGNOSIS — R0602 Shortness of breath: Secondary | ICD-10-CM | POA: Diagnosis not present

## 2017-08-11 DIAGNOSIS — R9431 Abnormal electrocardiogram [ECG] [EKG]: Secondary | ICD-10-CM | POA: Diagnosis not present

## 2017-08-11 DIAGNOSIS — S199XXA Unspecified injury of neck, initial encounter: Secondary | ICD-10-CM | POA: Diagnosis not present

## 2017-08-11 DIAGNOSIS — S0990XA Unspecified injury of head, initial encounter: Secondary | ICD-10-CM | POA: Diagnosis not present

## 2017-08-11 DIAGNOSIS — J441 Chronic obstructive pulmonary disease with (acute) exacerbation: Secondary | ICD-10-CM | POA: Diagnosis present

## 2017-08-11 DIAGNOSIS — F22 Delusional disorders: Secondary | ICD-10-CM | POA: Diagnosis not present

## 2017-08-11 DIAGNOSIS — J90 Pleural effusion, not elsewhere classified: Secondary | ICD-10-CM | POA: Diagnosis not present

## 2017-08-11 DIAGNOSIS — I482 Chronic atrial fibrillation: Secondary | ICD-10-CM | POA: Diagnosis present

## 2017-08-11 DIAGNOSIS — Z8673 Personal history of transient ischemic attack (TIA), and cerebral infarction without residual deficits: Secondary | ICD-10-CM | POA: Diagnosis not present

## 2017-08-11 DIAGNOSIS — K219 Gastro-esophageal reflux disease without esophagitis: Secondary | ICD-10-CM | POA: Diagnosis present

## 2017-08-11 DIAGNOSIS — G309 Alzheimer's disease, unspecified: Secondary | ICD-10-CM | POA: Diagnosis not present

## 2017-08-11 DIAGNOSIS — I272 Pulmonary hypertension, unspecified: Secondary | ICD-10-CM | POA: Diagnosis present

## 2017-08-11 DIAGNOSIS — J449 Chronic obstructive pulmonary disease, unspecified: Secondary | ICD-10-CM | POA: Diagnosis not present

## 2017-08-11 DIAGNOSIS — J189 Pneumonia, unspecified organism: Secondary | ICD-10-CM | POA: Diagnosis not present

## 2017-08-11 DIAGNOSIS — J439 Emphysema, unspecified: Secondary | ICD-10-CM | POA: Diagnosis not present

## 2017-08-11 DIAGNOSIS — F0281 Dementia in other diseases classified elsewhere with behavioral disturbance: Secondary | ICD-10-CM | POA: Diagnosis not present

## 2017-08-11 NOTE — Progress Notes (Signed)
The patient is TENTATIVELY accepted to [facility] for inpatient gero-psych treatment.   Disposition CSW awaiting Healthcare POA paperwork from the patient's wife, Zachary Warren. Per Nolberto Hanlon note, the patient's wife agreed to bring required documentation to the hospital today.    CSW will continue to follow.    Radonna Ricker MSW, [place] Disposition [phone]

## 2017-08-11 NOTE — ED Notes (Addendum)
[facility] transport called to transport pt to [place].

## 2017-08-11 NOTE — Progress Notes (Signed)
Accepted to [facility] for inpatient gero-psych treatment.   Accepting and attending physician is Dr. Geanie Kenning.  Number for report is [phone] Patient can transport to facility now, the bed is ready.   Melissa from [place] confirms that the patient's Healthcare POA paperwork and Consent forms were received.   Cena Benton, RN notified.   Radonna Ricker MSW, [place] Disposition [phone]

## 2017-08-11 NOTE — ED Notes (Signed)
Assisted pt to bedside, stood up & help pt to use the urinal. Pt had been incontinent of urine. Pt cleaned & placed in dry under garments. Pt helped back into bed.

## 2017-08-17 ENCOUNTER — Encounter: Payer: Self-pay | Admitting: Cardiology

## 2017-09-25 ENCOUNTER — Other Ambulatory Visit: Payer: Self-pay | Admitting: Internal Medicine

## 2017-10-06 DIAGNOSIS — M5030 Other cervical disc degeneration, unspecified cervical region: Secondary | ICD-10-CM | POA: Insufficient documentation

## 2017-10-06 DIAGNOSIS — R569 Unspecified convulsions: Secondary | ICD-10-CM | POA: Insufficient documentation

## 2017-10-06 DIAGNOSIS — R413 Other amnesia: Secondary | ICD-10-CM | POA: Insufficient documentation

## 2017-10-06 DIAGNOSIS — J449 Chronic obstructive pulmonary disease, unspecified: Secondary | ICD-10-CM | POA: Insufficient documentation

## 2017-10-06 DIAGNOSIS — I10 Essential (primary) hypertension: Secondary | ICD-10-CM | POA: Insufficient documentation

## 2017-10-06 DIAGNOSIS — Z87442 Personal history of urinary calculi: Secondary | ICD-10-CM | POA: Insufficient documentation

## 2017-10-06 DIAGNOSIS — F0390 Unspecified dementia without behavioral disturbance: Secondary | ICD-10-CM | POA: Insufficient documentation

## 2017-10-06 DIAGNOSIS — I495 Sick sinus syndrome: Secondary | ICD-10-CM | POA: Insufficient documentation

## 2017-10-06 DIAGNOSIS — Z950 Presence of cardiac pacemaker: Secondary | ICD-10-CM | POA: Insufficient documentation

## 2017-10-06 DIAGNOSIS — I739 Peripheral vascular disease, unspecified: Secondary | ICD-10-CM | POA: Insufficient documentation

## 2017-10-06 DIAGNOSIS — F329 Major depressive disorder, single episode, unspecified: Secondary | ICD-10-CM | POA: Insufficient documentation

## 2017-10-06 DIAGNOSIS — I4819 Other persistent atrial fibrillation: Secondary | ICD-10-CM | POA: Insufficient documentation

## 2017-10-06 DIAGNOSIS — I2510 Atherosclerotic heart disease of native coronary artery without angina pectoris: Secondary | ICD-10-CM | POA: Insufficient documentation

## 2017-10-06 DIAGNOSIS — J189 Pneumonia, unspecified organism: Secondary | ICD-10-CM | POA: Insufficient documentation

## 2017-10-06 DIAGNOSIS — G4733 Obstructive sleep apnea (adult) (pediatric): Secondary | ICD-10-CM | POA: Insufficient documentation

## 2017-10-06 DIAGNOSIS — F32A Depression, unspecified: Secondary | ICD-10-CM | POA: Insufficient documentation

## 2017-10-11 IMAGING — CR DG CHEST 1V PORT
1 series · 1 of 1 positions shown · non-contrast
Comparison: Radiographs 05/13/2017

CLINICAL DATA: Cough and fever.

EXAM:
PORTABLE CHEST 1 VIEW

[portable]
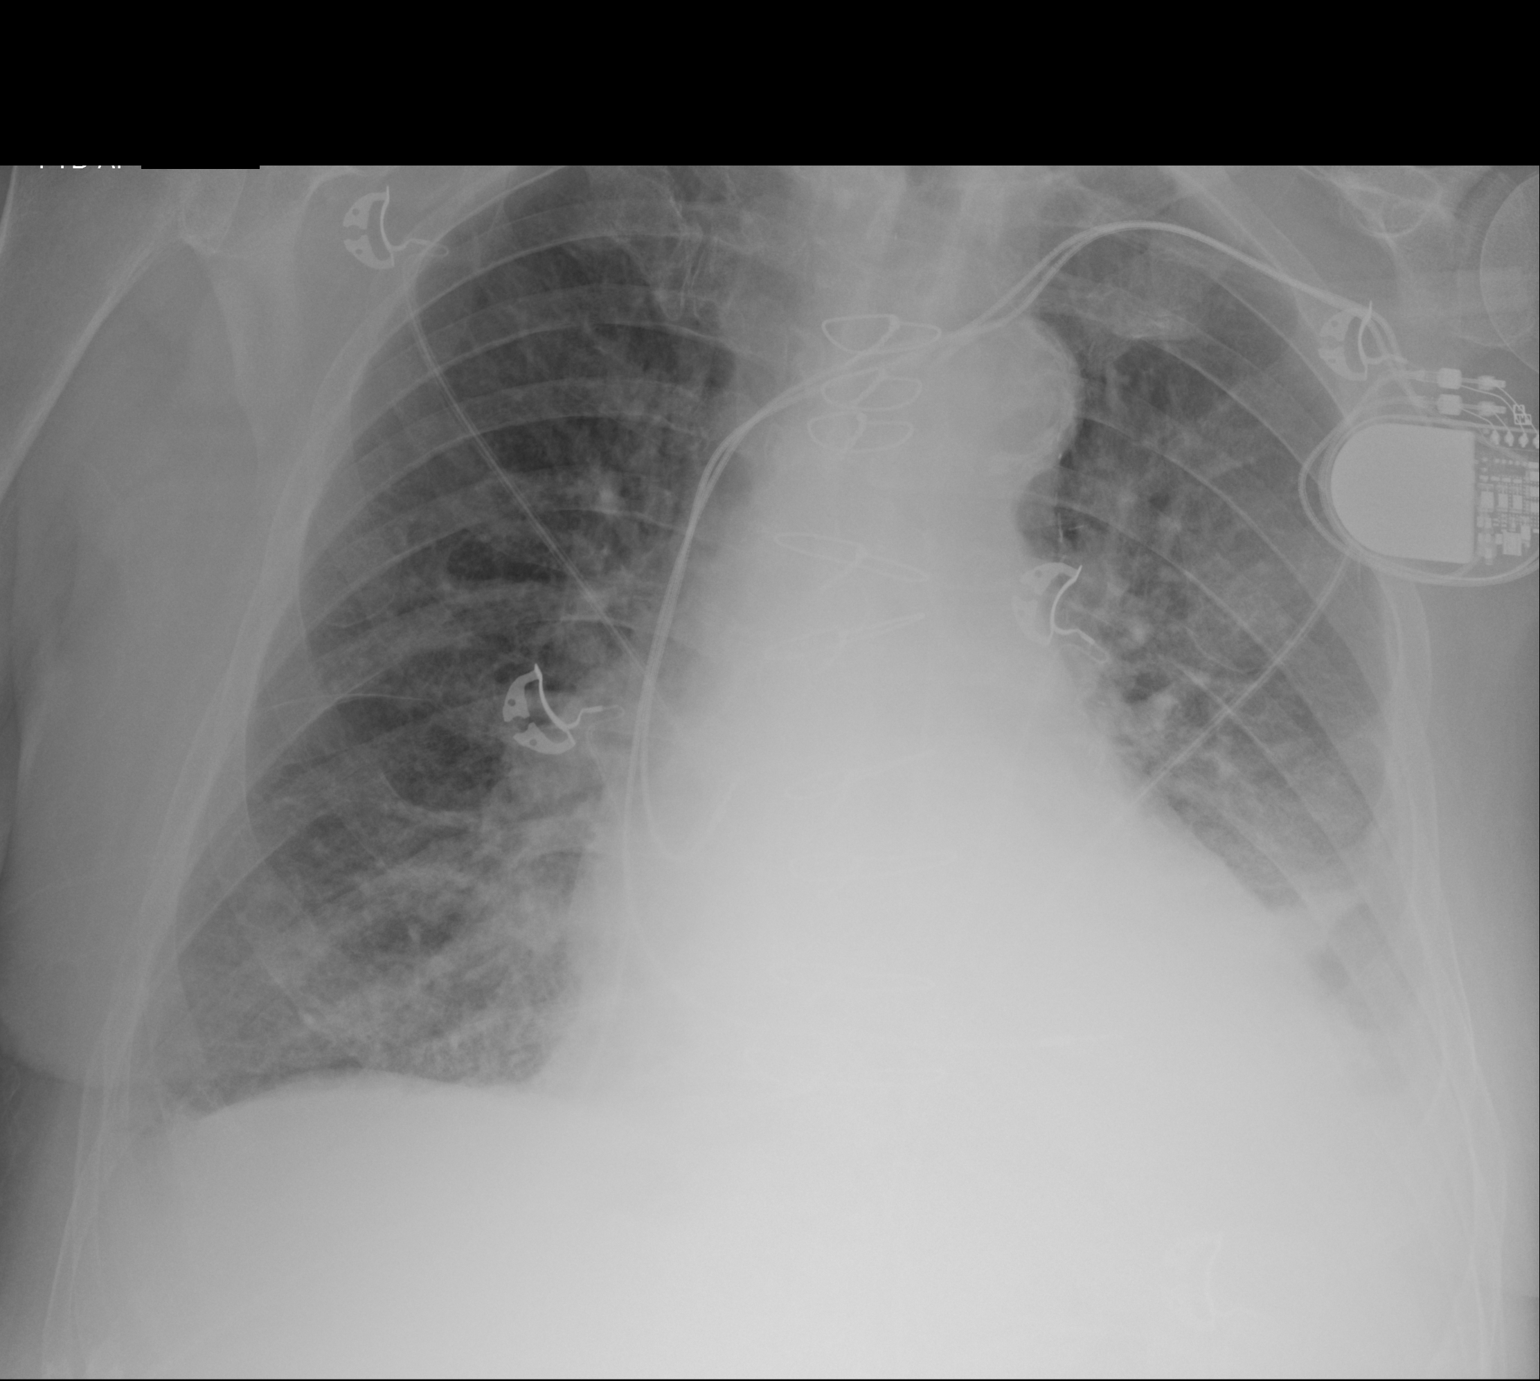

[1 of 1 positions shown; findings below may reference images not displayed]

FINDINGS: Post median sternotomy. Left-sided pacemaker in place. Cardiomegaly
with atherosclerotic thoracic aorta. Progressive retrocardiac
opacity from prior exam likely combination of pleural fluid and
airspace disease. Small right pleural effusion. Increased
peribronchial thickening likely pulmonary edema. Lungs remain
hyperinflated. No pneumothorax.
IMPRESSION: 1. Cardiomegaly with peribronchial thickening favoring pulmonary
edema.
2. Retrocardiac opacity likely combination of pleural fluid and
airspace disease, atelectasis or pneumonia. Small right pleural
effusion. Overall findings suggest CHF, concurrent left lower lobe
pneumonia not excluded. Recommend continued radiographic follow-up
to resolution, as retrocardiac opacity has been present on prior
exams.

## 2017-10-13 IMAGING — CR DG CHEST 1V PORT
1 series · 1 of 1 positions shown · non-contrast
Comparison: Two days ago

CLINICAL DATA: Left lower lobe pneumonia

EXAM:
PORTABLE CHEST 1 VIEW

[portable]
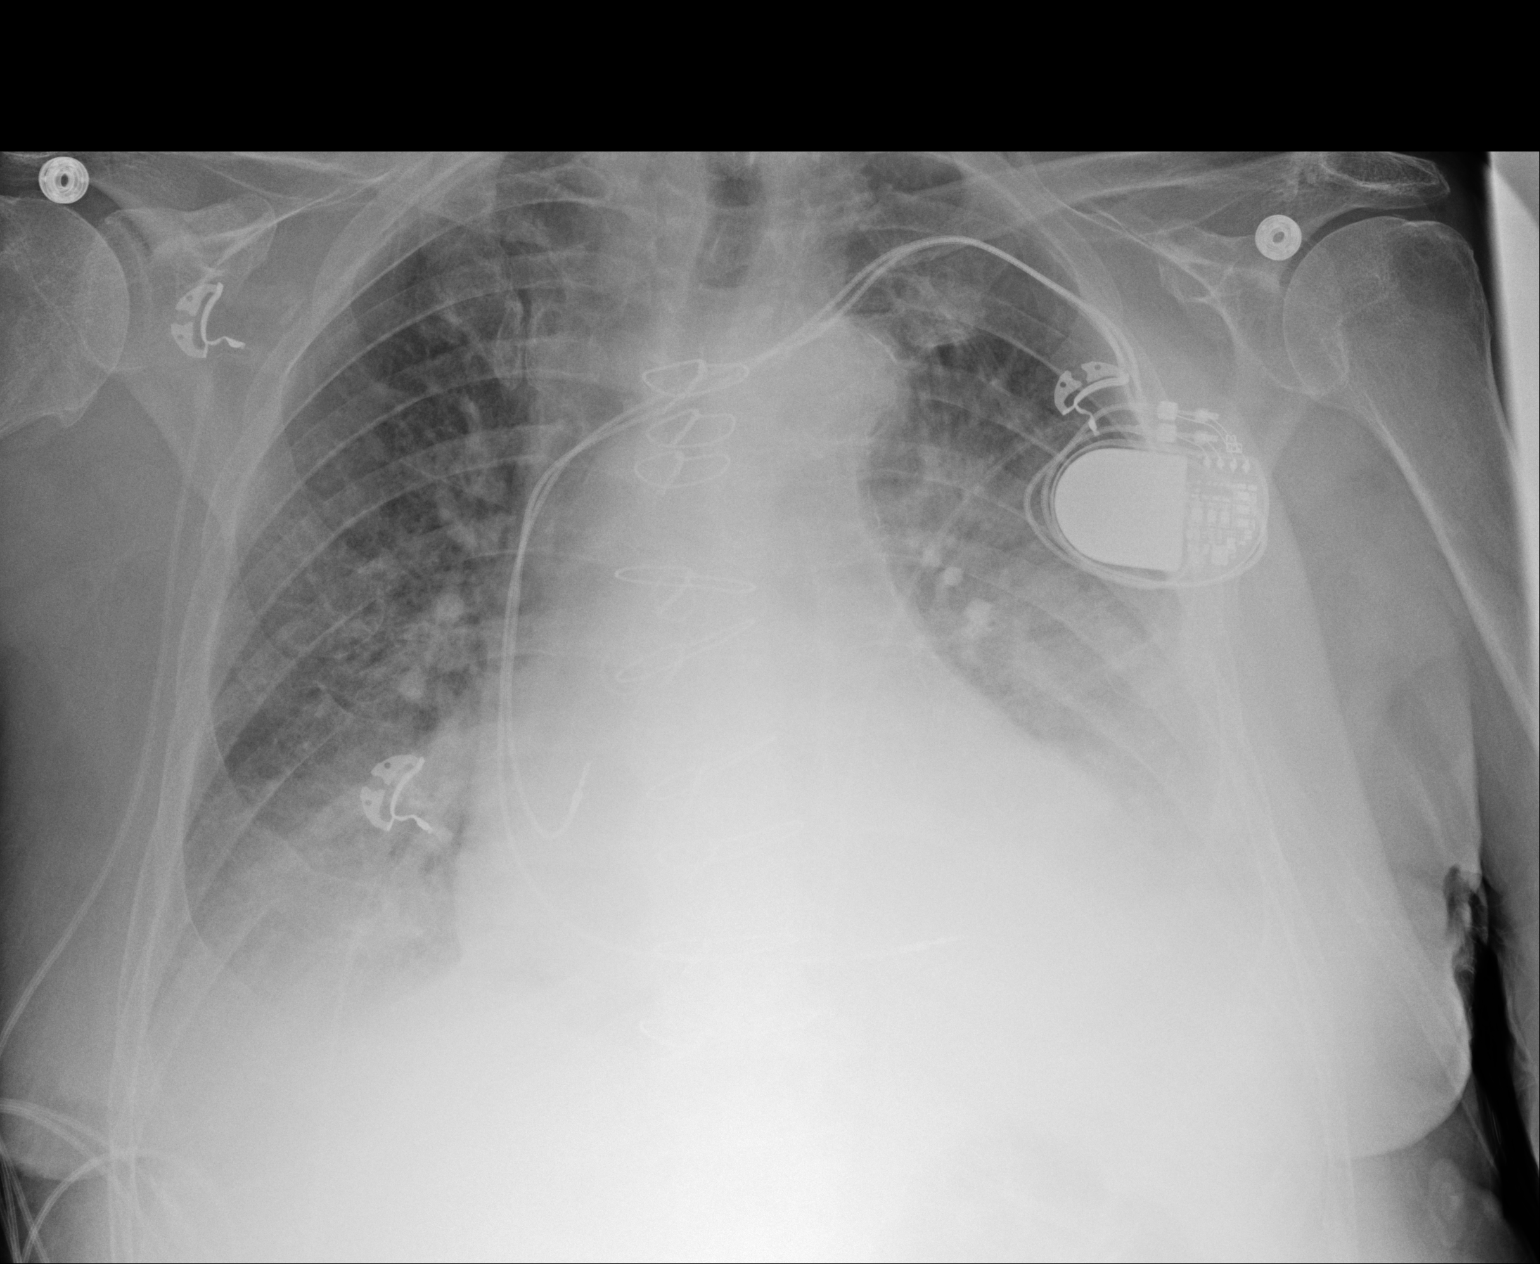

[1 of 1 positions shown; findings below may reference images not displayed]

FINDINGS: Haziness of the left more than right bases consistent with pleural
fluid right atelectasis. There is history of left lower lobe
pneumonia which would be obscured. Vascular congestion/ edema.
Cardiomegaly with dual-chamber implant. Previous median sternotomy
with history of CABG. No evidence of pneumothorax.
IMPRESSION: Progressive pleural effusions and vascular congestion/ edema.

## 2017-10-27 ENCOUNTER — Ambulatory Visit (INDEPENDENT_AMBULATORY_CARE_PROVIDER_SITE_OTHER): Payer: Medicare Other | Admitting: Internal Medicine

## 2017-10-27 ENCOUNTER — Encounter: Payer: Self-pay | Admitting: Internal Medicine

## 2017-10-27 VITALS — BP 124/58 | HR 87 | Ht 69.0 in | Wt 190.0 lb

## 2017-10-27 DIAGNOSIS — I495 Sick sinus syndrome: Secondary | ICD-10-CM | POA: Diagnosis not present

## 2017-10-27 DIAGNOSIS — I4821 Permanent atrial fibrillation: Secondary | ICD-10-CM

## 2017-10-27 DIAGNOSIS — I482 Chronic atrial fibrillation: Secondary | ICD-10-CM | POA: Diagnosis not present

## 2017-10-27 DIAGNOSIS — I10 Essential (primary) hypertension: Secondary | ICD-10-CM | POA: Diagnosis not present

## 2017-10-27 MED ORDER — DILTIAZEM HCL ER COATED BEADS 180 MG PO CP24: 180.0000 mg | ORAL_CAPSULE | Freq: Two times a day (BID) | ORAL | 3 refills | Status: AC

## 2017-10-27 MED ORDER — APIXABAN 5 MG PO TABS: 5.0000 mg | ORAL_TABLET | Freq: Two times a day (BID) | ORAL | 11 refills | Status: AC

## 2017-10-27 NOTE — Progress Notes (Signed)
PCP: Wenda Low, MD Primary Cardiologist: Primary EP:  Dr Illene Silver is a 84 y.o. male who presents today for routine electrophysiology followup.  Since last being seen in our clinic, the patient reports doing reasonably well.  He has advanced dementia.  He has been admitted to behavioral health due to recent cognitive difficulties.  He states that he is having "no problems".  He has stable edema.  Today, he denies symptoms of palpitations, chest pain, shortness of breath,  dizziness, presyncope, or syncope.  The patient is otherwise without complaint today.   Past Medical History:  Diagnosis Date  . COPD (chronic obstructive pulmonary disease) ([place])   . Coronary artery disease    s/p 5v CABG 2002  . CVA (cerebral infarction) 1999  . DDD (degenerative disc disease), cervical   . Dementia   . Depression   . GERD (gastroesophageal reflux disease)   . History of kidney stones   . Hyperlipidemia   . Hypertension   . Memory impairment   . OSA (obstructive sleep apnea)    Recently diagnosed though pt. has not had CPAP trial  . Pacemaker   . Persistent atrial fibrillation ([place])    on pradaxa  . Pneumonia    recent hospital admission for pneumonia   . Prostate cancer ([place]) 2006   s/p XRT  . PVD (peripheral vascular disease) ([place])   . Seizures ([place])   . Tachycardia-bradycardia [facility])    s/p PPM by [facility] 6/12   Past Surgical History:  Procedure Laterality Date  . CARDIOVERSION N/A 08/21/2013   Procedure: CARDIOVERSION;  Surgeon: Candee Furbish, MD;  Location: [place];  Service: Cardiovascular;  Laterality: N/A;  . [place]  . CORONARY ARTERY BYPASS GRAFT  2002  . CYSTOSCOPY W/ URETERAL STENT PLACEMENT Left 05/13/2017   Procedure: CYSTOSCOPY WITH RETROGRADE PYELOGRAM/URETERAL STENT PLACEMENT LEFT;  Surgeon: Alexis Frock, MD;  Location: [facility];  Service: Urology;  Laterality: Left;  . CYSTOSCOPY WITH RETROGRADE PYELOGRAM, URETEROSCOPY AND STENT PLACEMENT Left  06/09/2017   Procedure: CYSTOSCOPY WITH RETROGRADE PYELOGRAM, URETEROSCOPY;  Surgeon: Alexis Frock, MD;  Location: [facility];  Service: Urology;  Laterality: Left;  . HOLMIUM LASER APPLICATION Left 7/82/4235   Procedure: HOLMIUM LASER APPLICATION;  Surgeon: Alexis Frock, MD;  Location: [facility];  Service: Urology;  Laterality: Left;  . INSERT / REPLACE / REMOVE PACEMAKER    . NECK SURGERY     hx of   . PACEMAKER INSERTION     implanted by [facility] 6/12    ROS- all systems are reviewed and negative except as per HPI above  Current Outpatient Medications  Medication Sig Dispense Refill  . acetaminophen (TYLENOL) 500 MG tablet Take 500 mg by mouth daily as needed for mild pain or moderate pain.    Marland Kitchen aspirin EC 81 MG tablet Take 81 mg by mouth daily.    Marland Kitchen atorvastatin (LIPITOR) 40 MG tablet TAKE 1 TABLET BY MOUTH EVERY DAY - PATIENT DUE FOR LIPID PANEL (Patient taking differently: TAKE 1 TABLET BY MOUTH EVERY DAY EVERY EVENING - PATIENT DUE FOR LIPID PANEL) 90 tablet 1  . budesonide-formoterol (SYMBICORT) 80-4.5 MCG/ACT inhaler Inhale 2 puffs into the lungs daily.    Marland Kitchen buPROPion (WELLBUTRIN) 75 MG tablet Take 75 mg by mouth 2 (two) times daily.    . calcium carbonate (TUMS - DOSED IN MG ELEMENTAL CALCIUM) 500 MG chewable tablet Chew 1 tablet by mouth 2 (two) times daily.    Marland Kitchen  cholecalciferol (VITAMIN D) 1000 units tablet Take 1,000 Units by mouth daily.    Marland Kitchen diltiazem (CARDIZEM CD) 120 MG 24 hr capsule Take 120 mg by mouth 2 (two) times daily.    Marland Kitchen FLUoxetine (PROZAC) 40 MG capsule Take 40 mg by mouth daily.      . furosemide (LASIX) 20 MG tablet Take 2 tablets (40 mg total) by mouth daily. (Patient taking differently: Take 20 mg by mouth daily. ) 60 tablet 0  . guaiFENesin (MUCINEX) 600 MG 12 hr tablet Take 600 mg by mouth 2 (two) times daily.    Marland Kitchen ipratropium-albuterol (DUONEB) 0.5-2.5 (3) MG/3ML SOLN Take 3 mLs by nebulization every 6 (six) hours. (Patient taking differently: Take 3 mLs by  nebulization daily. ) 360 mL 0  . LORazepam (ATIVAN) 0.5 MG tablet Take 0.5 mg by mouth at bedtime.  2  . metoprolol tartrate (LOPRESSOR) 25 MG tablet Take 0.5 tablets (12.5 mg total) by mouth 2 (two) times daily. 60 tablet 0  . Multiple Vitamins-Minerals (MULTIVITAMIN) tablet Take 1 tablet by mouth daily.    Marland Kitchen omeprazole (PRILOSEC OTC) 20 MG tablet Take 20 mg by mouth 2 (two) times daily.      . pantoprazole (PROTONIX) 40 MG tablet Take 40 mg by mouth daily.    . potassium chloride SA (K-DUR,KLOR-CON) 20 MEQ tablet Take 20 mEq by mouth daily.    . risperiDONE (RISPERDAL) 0.25 MG tablet Take 0.25 mg by mouth 2 (two) times daily as needed.    . topiramate (TOPAMAX) 50 MG tablet Take 1 tablet (50 mg total) by mouth 2 (two) times daily. (Patient taking differently: Take 50 mg by mouth daily. ) 180 tablet 4  . traMADol (ULTRAM) 50 MG tablet Take 1 tablet (50 mg total) by mouth every 6 (six) hours as needed. For post-op pain. 20 tablet 0  . traZODone (DESYREL) 50 MG tablet Take 50 mg by mouth at bedtime as needed.     No current facility-administered medications for this visit.     Physical Exam: Vitals:   10/27/17 1449  BP: (!) 124/58  Pulse: 87  SpO2: 96%  Weight: 190 lb (86.2 kg)  Height: 5\' 9"  (1.753 m)    GEN- The patient is well appearing, alert,  He has vascular dementia Head- normocephalic, atraumatic Eyes-  Sclera clear, conjunctiva pink Ears- hearing intact Oropharynx- clear Lungs- Clear to ausculation bilaterally, normal work of breathing Chest- pacemaker pocket is well healed Heart- irregular rate and rhythm  GI- soft, NT, ND, + BS Extremities- no clubbing, cyanosis, or edema  Pacemaker interrogation- reviewed in detail today,  See PACEART report   Assessment and Plan:  1. Symptomatic bradycardia  Normal pacemaker function See Pace Art report No changes today  2. Permanent atrial fibrillation chads2vasc score is 6.  He is now on ASA rather than eliquis.  I have  searched epic and do not see bleeding issues.  I have spoken at length with his wife today who feels that we can stop ASA and resume eliquis given discussed bleeding risks and stroke risks.  Device histogram reveals frequent elevation of V rates > 100 bpm.  Increase diltiazem CD to 180mg  BID today.  3. Chronic diastolic dysfunction Stable No change required today  4. HTN Stable No change required today  5. CAD No ischemic symptoms No changes  [facility] Return to see EP NP every year Overdue to see Dr Vincente Liberty MD, [facility] 10/27/2017 3:09 PM

## 2017-10-27 NOTE — Patient Instructions (Signed)
Medication Instructions:  Your physician has recommended you make the following change in your medication: 1) Increase Diltiazem to 180 mg twice daily 2) Stop Aspirin  3) Start Eliquis 5 mg twice daily   Labwork: None ordered   Testing/Procedures: None ordered   Follow-Up:  Your physician recommends that you schedule a follow-up appointment  with Dr Marlou Porch in 6 weeks  Remote monitoring is used to monitor your Pacemaker from home. This monitoring reduces the number of office visits required to check your device to one time per year. It allows [place] to keep an eye on the functioning of your device to ensure it is working properly. You are scheduled for a device check from home on 11/09/17. You may send your transmission at any time that day. If you have a wireless device, the transmission will be sent automatically. After your physician reviews your transmission, you will receive a postcard with your next transmission date.  Your physician wants you to follow-up in: 12 months with Chanetta Marshall, NP You will receive a reminder letter in the mail two months in advance. If you don't receive a letter, please call our office to schedule the follow-up appointment.    Any Other Special Instructions Will Be Listed Below (If Applicable).     If you need a refill on your cardiac medications before your next appointment, please call your pharmacy.

## 2017-10-28 LAB — CUP PACEART INCLINIC DEVICE CHECK
Battery Impedance: 400 Ohm
Battery Remaining Longevity: 109 mo
Battery Voltage: 2.78 V
Date Time Interrogation Session: 20190116195357
Implantable Lead Implant Date: 20120609
Implantable Lead Model: 5076
Implantable Lead Model: 5076
Implantable Pulse Generator Implant Date: 20120609
Lead Channel Impedance Value: 67 Ohm
Lead Channel Pacing Threshold Amplitude: 1 V
Lead Channel Pacing Threshold Pulse Width: 0.4 ms
Lead Channel Setting Pacing Amplitude: 2.5 V
Lead Channel Setting Pacing Pulse Width: 0.4 ms
Lead Channel Setting Sensing Sensitivity: 4 mV
MDC IDC LEAD IMPLANT DT: 20120609
MDC IDC LEAD LOCATION: 753859
MDC IDC LEAD LOCATION: 753860
MDC IDC MSMT LEADCHNL RV IMPEDANCE VALUE: 416 Ohm
MDC IDC MSMT LEADCHNL RV PACING THRESHOLD AMPLITUDE: 0.875 V
MDC IDC MSMT LEADCHNL RV PACING THRESHOLD PULSEWIDTH: 0.4 ms
MDC IDC MSMT LEADCHNL RV SENSING INTR AMPL: 8 mV
MDC IDC STAT BRADY RV PERCENT PACED: 22 %

## 2017-11-08 ENCOUNTER — Ambulatory Visit (INDEPENDENT_AMBULATORY_CARE_PROVIDER_SITE_OTHER): Payer: Medicare Other | Admitting: *Deleted

## 2017-11-08 DIAGNOSIS — I495 Sick sinus syndrome: Secondary | ICD-10-CM | POA: Diagnosis not present

## 2017-11-08 NOTE — Progress Notes (Signed)
Remote pacemaker transmission.   

## 2017-11-10 ENCOUNTER — Encounter: Payer: Self-pay | Admitting: Cardiology

## 2017-11-10 LAB — CUP PACEART REMOTE DEVICE CHECK
Battery Impedance: 425 Ohm
Brady Statistic RV Percent Paced: 15 %
Date Time Interrogation Session: 20190128151534
Implantable Lead Implant Date: 20120609
Implantable Lead Location: 753859
Implantable Lead Model: 5076
Implantable Pulse Generator Implant Date: 20120609
Lead Channel Impedance Value: 407 Ohm
Lead Channel Impedance Value: 67 Ohm
Lead Channel Pacing Threshold Pulse Width: 0.4 ms
Lead Channel Setting Pacing Amplitude: 2.5 V
Lead Channel Setting Pacing Pulse Width: 0.4 ms
MDC IDC LEAD IMPLANT DT: 20120609
MDC IDC LEAD LOCATION: 753860
MDC IDC MSMT BATTERY REMAINING LONGEVITY: 110 mo
MDC IDC MSMT BATTERY VOLTAGE: 2.77 V
MDC IDC MSMT LEADCHNL RV PACING THRESHOLD AMPLITUDE: 0.875 V
MDC IDC MSMT LEADCHNL RV SENSING INTR AMPL: 5.6 mV
MDC IDC SET LEADCHNL RV SENSING SENSITIVITY: 2.8 mV

## 2017-11-11 IMAGING — CR DG CHEST 1V PORT
1 series · 1 of 1 positions shown · non-contrast
Comparison: July 12, 2017

CLINICAL DATA: Hypertension.  Cardiac arrhythmia.

EXAM:
PORTABLE CHEST 1 VIEW

[portable]
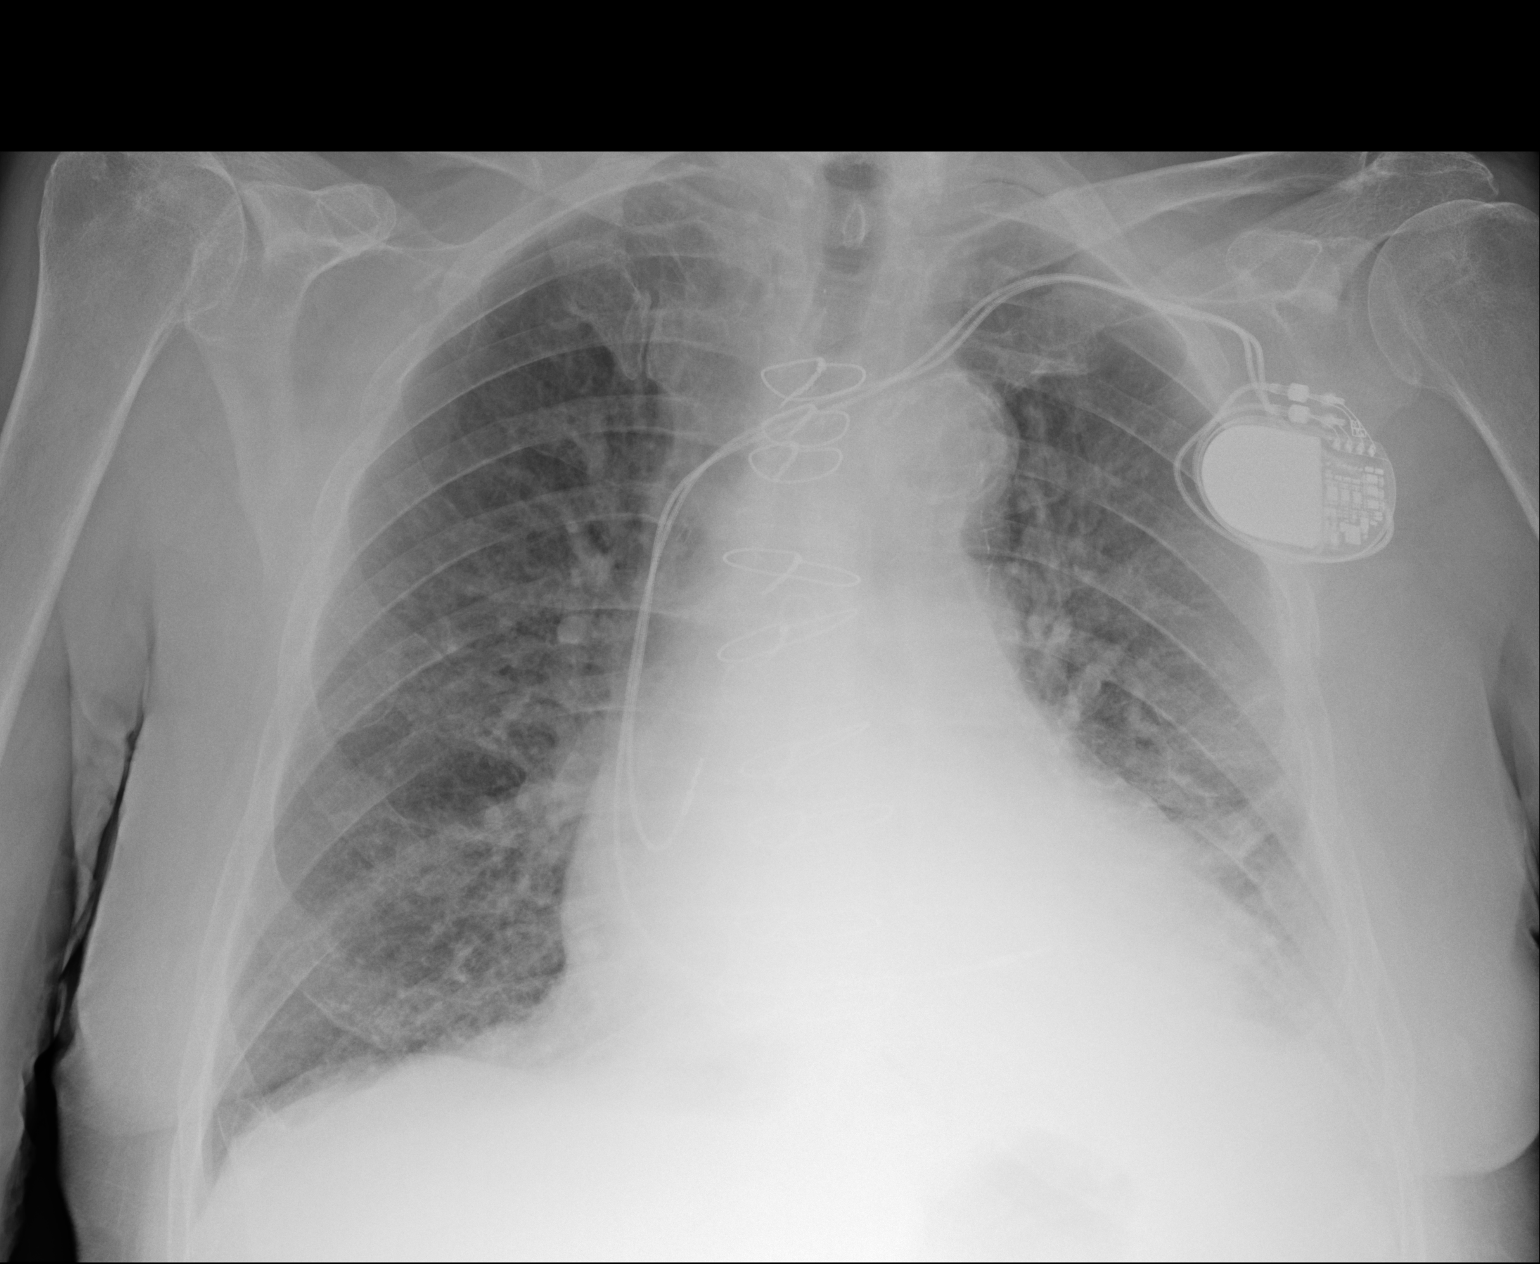

[1 of 1 positions shown; findings below may reference images not displayed]

FINDINGS: There is airspace consolidation in the left base. There is no other
airspace consolidation. There is mild interstitial edema with
cardiomegaly and pulmonary venous hypertension. Pacemaker leads are
attached the right atrium right ventricle. Patient is status post
median sternotomy. There is aortic atherosclerosis. No evident
adenopathy. No bone lesions.
IMPRESSION: Airspace consolidation left lower lobe. Suspect pneumonia. There is
interstitial edema with cardiomegaly and pulmonary venous
hypertension. There may be a degree of underlying congestive heart
failure.

Pacemaker leads are attached to the right atrium and right
ventricle. There is aortic atherosclerosis.

Aortic Atherosclerosis ([id number]).

Followup PA and lateral chest radiographs recommended in 3-4 weeks
following trial of antibiotic therapy to ensure resolution and
exclude underlying malignancy.

## 2017-12-02 ENCOUNTER — Encounter: Payer: Self-pay | Admitting: Cardiology

## 2017-12-09 ENCOUNTER — Ambulatory Visit (INDEPENDENT_AMBULATORY_CARE_PROVIDER_SITE_OTHER): Payer: Medicare Other | Admitting: Cardiology

## 2017-12-09 ENCOUNTER — Encounter: Payer: Self-pay | Admitting: Cardiology

## 2017-12-09 VITALS — BP 126/68 | HR 89 | Ht 68.0 in | Wt 201.8 lb

## 2017-12-09 DIAGNOSIS — I495 Sick sinus syndrome: Secondary | ICD-10-CM

## 2017-12-09 DIAGNOSIS — I482 Chronic atrial fibrillation: Secondary | ICD-10-CM

## 2017-12-09 DIAGNOSIS — Z950 Presence of cardiac pacemaker: Secondary | ICD-10-CM | POA: Diagnosis not present

## 2017-12-09 DIAGNOSIS — I4821 Permanent atrial fibrillation: Secondary | ICD-10-CM

## 2017-12-09 NOTE — Progress Notes (Signed)
[facility] [address], [address] [place], [place]  [address] Phone: [phone]  Fax:  [phone]  Date:  12/09/2017   ID:  Zachary Warren, DOB May 25, 1933, MRN [id number]  PCP:  Wenda Low, MD   History of Present Illness: Zachary Warren is a 84 y.o. male with a hx of coronary artery disease status post bypass in 2003,and permanent atrial fibrillation cardioversion, x 3, failed amiodarone secondary to return of atrial fibrillation despite maintenance therapy, discontinuation of amiodarone. He's currently on anticoagulation with Eliquis. He had pacemaker placed by Dr. Rayann Heman 6/12.   His pacemaker is functioning well.  Heart rates were noted to be greater than 100.  His diltiazem was increased at previous visit.  He has been diagnosed with vascular dementia.  He is now at behavioral facility.   Wife goes to [facility] support group. Wife is an Training and development officer. Used to be a Chartered certified accountant. She won gold metal quilting   Wt Readings from Last 3 Encounters:  12/09/17 201 lb 12.8 oz (91.5 kg)  10/27/17 190 lb (86.2 kg)  07/10/17 193 lb (87.5 kg)     Past Medical History:  Diagnosis Date  . COPD (chronic obstructive pulmonary disease) ([place])   . Coronary artery disease    s/p 5v CABG 2002  . CVA (cerebral infarction) 1999  . DDD (degenerative disc disease), cervical   . Dementia   . Depression   . GERD (gastroesophageal reflux disease)   . History of kidney stones   . Hyperlipidemia   . Hypertension   . Memory impairment   . OSA (obstructive sleep apnea)    Recently diagnosed though pt. has not had CPAP trial  . Permanent atrial fibrillation (HCC)    chads2vasc score is 6  . Pneumonia    recent hospital admission for pneumonia   . Prostate cancer ([place]) 2006   s/p XRT  . PVD (peripheral vascular disease) ([place])   . Seizures ([place])   . Tachycardia-bradycardia [facility])    s/p PPM by [facility] 6/12    Past Surgical History:  Procedure Laterality Date  . CARDIOVERSION N/A 08/21/2013   Procedure: CARDIOVERSION;  Surgeon: Candee Furbish, MD;  Location: [facility];  Service: Cardiovascular;  Laterality: N/A;  . [place]  . CORONARY ARTERY BYPASS GRAFT  2002  . CYSTOSCOPY W/ URETERAL STENT PLACEMENT Left 05/13/2017   Procedure: CYSTOSCOPY WITH RETROGRADE PYELOGRAM/URETERAL STENT PLACEMENT LEFT;  Surgeon: Alexis Frock, MD;  [facility];  Service: Urology;  Laterality: Left;  . CYSTOSCOPY WITH RETROGRADE PYELOGRAM, URETEROSCOPY AND STENT PLACEMENT Left 06/09/2017   Procedure: CYSTOSCOPY WITH RETROGRADE PYELOGRAM, URETEROSCOPY;  Surgeon: Alexis Frock, MD;  [facility];  Service: Urology;  Laterality: Left;  . HOLMIUM LASER APPLICATION Left 01/12/4741   Procedure: HOLMIUM LASER APPLICATION;  Surgeon: Alexis Frock, MD;  [facility];  Service: Urology;  Laterality: Left;  . INSERT / REPLACE / REMOVE PACEMAKER    . NECK SURGERY     hx of   . PACEMAKER INSERTION     implanted by [facility] 6/12    Current Outpatient Medications  Medication Sig Dispense Refill  . acetaminophen (TYLENOL) 500 MG tablet Take 500 mg by mouth daily as needed for mild pain or moderate pain.    Marland Kitchen apixaban (ELIQUIS) 5 MG TABS tablet Take 1 tablet (5 mg total) by mouth 2 (two) times daily. 60 tablet 11  . atorvastatin (LIPITOR) 40 MG tablet TAKE 1 TABLET BY MOUTH EVERY DAY -  PATIENT DUE FOR LIPID PANEL (Patient taking differently: TAKE 1 TABLET BY MOUTH EVERY DAY EVERY EVENING - PATIENT DUE FOR LIPID PANEL) 90 tablet 1  . budesonide-formoterol (SYMBICORT) 80-4.5 MCG/ACT inhaler Inhale 2 puffs into the lungs daily.    Marland Kitchen buPROPion (WELLBUTRIN) 75 MG tablet Take 75 mg by mouth 2 (two) times daily.    . calcium carbonate (TUMS - DOSED IN MG ELEMENTAL CALCIUM) 500 MG chewable tablet Chew 1 tablet by mouth 2 (two) times daily.    . cholecalciferol (VITAMIN D) 1000 units tablet Take 1,000 Units by mouth daily.    Marland Kitchen diltiazem (CARTIA XT) 180 MG 24 hr capsule Take 1 capsule (180 mg total) by mouth 2 (two)  times daily. Please keep upcoming appt for future refills. Thank you 180 capsule 3  . FLUoxetine (PROZAC) 40 MG capsule Take 40 mg by mouth daily.      . furosemide (LASIX) 20 MG tablet Take 2 tablets (40 mg total) by mouth daily. (Patient taking differently: Take 20 mg by mouth daily. ) 60 tablet 0  . guaiFENesin (MUCINEX) 600 MG 12 hr tablet Take 600 mg by mouth 2 (two) times daily.    Marland Kitchen ipratropium-albuterol (DUONEB) 0.5-2.5 (3) MG/3ML SOLN Take 3 mLs by nebulization every 6 (six) hours. (Patient taking differently: Take 3 mLs by nebulization daily. ) 360 mL 0  . pantoprazole (PROTONIX) 40 MG tablet Take 40 mg by mouth daily.    . potassium chloride SA (K-DUR,KLOR-CON) 20 MEQ tablet Take 20 mEq by mouth 2 (two) times daily.     . risperiDONE (RISPERDAL) 0.25 MG tablet Take 0.25 mg by mouth 2 (two) times daily as needed.    . topiramate (TOPAMAX) 50 MG tablet Take 1 tablet (50 mg total) by mouth 2 (two) times daily. (Patient taking differently: Take 50 mg by mouth daily. ) 180 tablet 4  . traZODone (DESYREL) 50 MG tablet Take 50 mg by mouth at bedtime as needed.     No current facility-administered medications for this visit.     Allergies:    Allergies  Allergen Reactions  . Aricept [Donepezil Hcl] Other (See Comments)    "stomach pain", generic  . Ceclor [Cefaclor]   . Namenda [Memantine Hcl] Other (See Comments)    "dizzy", brand medication  . Penicillins     Has patient had a PCN reaction causing immediate rash, facial/tongue/throat swelling, SOB or lightheadedness with hypotension: Unknown Has patient had a PCN reaction causing severe rash involving mucus membranes or skin necrosis: Unknown Has patient had a PCN reaction that required hospitalization: Unknown Has patient had a PCN reaction occurring within the last 10 years: Unknown If all of the above answers are "NO", then may proceed with Cephalosporin use.   . Ramipril Other (See Comments)     cough    Social History:  The  patient  reports that he quit smoking about 19 years ago. His smoking use included cigarettes. He has a 96.00 pack-year smoking history. he has never used smokeless tobacco. He reports that he does not drink alcohol or use drugs.   ROS:  Please see the history of present illness.   All others neg. No orthopnea PND bleeding syncope  PHYSICAL EXAM: VS:  BP 126/68   Pulse 89   Ht 5\' 8"  (1.727 m)   Wt 201 lb 12.8 oz (91.5 kg)   SpO2 95%   BMI 30.68 kg/m  GEN: Well nourished, well developed, in no acute distress  HEENT: normal  Neck: no JVD, carotid bruits, or masses Cardiac: irreg; no murmurs, rubs, or gallops, chronic LE edema  Respiratory:  clear to auscultation bilaterally, normal work of breathing GI: soft, nontender, nondistended, + BS MS: no deformity or atrophy  Skin: warm and dry, no rash Neuro:  Alert and Oriented x 3, Strength and sensation are intact Psych: euthymic mood, full affect   EKG: 08/10/17-1 heart rate 80s, atrial fibrillation personally viewed-prior /5/15-heart rate 103, atrial fibrillation     ASSESSMENT AND PLAN:  1. Atrial fibrillation-permanent atrial fibrillation. Rate control has been somewhat challenging off of amiodarone but is adequate now with diltiazem XT 180 mg twice a day.  He did not feel any different after cardioversion in 11/14. Took him off of antiarrhythmic amiodarone because of persistent A. fib. Hence, we stopped his amiodarone in December of 2015.  Pacemaker in place for tachybradycardia syndrome. 2. Chronic anticoagulation-  Eliquis. Doing well.  No bleeding. 3. CAD - stable, no angina, post CABG in 2002 - right LE vein extraction.  4. Hyperlipidemia - LDL goal 70.  LDL checked on 02/25/17 5. Tachycardia/bradycardia syndrome-pacemaker functioning well.  Dr. Jackalyn Lombard note reviewed. 6. Obesity- doing very well. He was eating thickened liquids. Prior Aspiration.  No changes made. 7. Upper airway congestion/mucous production. Aspiration  precautions 8. Lower extremity edema-this is a fairly new process for him. They do eat quite a bit of salt, fast food. He is not very active. I will increase his Lasix to 40 mg once a day. Check basic metabolic profile in 2 weeks. I will also check an echocardiogram. 9. We'll see him back in 12 months.  Signed,         Candee Furbish, MD [facility]  12/09/2017 11:23 AM

## 2017-12-09 NOTE — Patient Instructions (Signed)
Medication Instructions:  Continue your current medications as listed.  Follow-Up: Follow up in 1 year with Dr. Marlou Porch.  You will receive a letter in the mail 2 months before you are due.  Please call us when you receive this letter to schedule your follow up appointment.  If you need a refill on your cardiac medications before your next appointment, please call your pharmacy.  Thank you for choosing [facility]!!

## 2018-01-20 DIAGNOSIS — R05 Cough: Secondary | ICD-10-CM | POA: Diagnosis not present

## 2018-01-20 DIAGNOSIS — J189 Pneumonia, unspecified organism: Secondary | ICD-10-CM | POA: Diagnosis not present

## 2018-01-20 DIAGNOSIS — R062 Wheezing: Secondary | ICD-10-CM | POA: Diagnosis not present

## 2018-01-20 DIAGNOSIS — I5033 Acute on chronic diastolic (congestive) heart failure: Secondary | ICD-10-CM | POA: Diagnosis not present

## 2018-01-20 DIAGNOSIS — I482 Chronic atrial fibrillation: Secondary | ICD-10-CM | POA: Diagnosis not present

## 2018-01-20 DIAGNOSIS — J449 Chronic obstructive pulmonary disease, unspecified: Secondary | ICD-10-CM | POA: Diagnosis not present

## 2018-02-03 DIAGNOSIS — E559 Vitamin D deficiency, unspecified: Secondary | ICD-10-CM | POA: Diagnosis not present

## 2018-02-03 DIAGNOSIS — Z79899 Other long term (current) drug therapy: Secondary | ICD-10-CM | POA: Diagnosis not present

## 2018-02-03 DIAGNOSIS — E038 Other specified hypothyroidism: Secondary | ICD-10-CM | POA: Diagnosis not present

## 2018-02-03 DIAGNOSIS — D518 Other vitamin B12 deficiency anemias: Secondary | ICD-10-CM | POA: Diagnosis not present

## 2018-02-03 DIAGNOSIS — E119 Type 2 diabetes mellitus without complications: Secondary | ICD-10-CM | POA: Diagnosis not present

## 2018-02-03 DIAGNOSIS — E7849 Other hyperlipidemia: Secondary | ICD-10-CM | POA: Diagnosis not present

## 2018-02-04 DIAGNOSIS — J189 Pneumonia, unspecified organism: Secondary | ICD-10-CM | POA: Diagnosis not present

## 2018-02-04 DIAGNOSIS — J449 Chronic obstructive pulmonary disease, unspecified: Secondary | ICD-10-CM | POA: Diagnosis not present

## 2018-02-04 DIAGNOSIS — I5033 Acute on chronic diastolic (congestive) heart failure: Secondary | ICD-10-CM | POA: Diagnosis not present

## 2018-02-04 DIAGNOSIS — I482 Chronic atrial fibrillation: Secondary | ICD-10-CM | POA: Diagnosis not present

## 2018-02-07 ENCOUNTER — Ambulatory Visit (INDEPENDENT_AMBULATORY_CARE_PROVIDER_SITE_OTHER): Payer: Medicare Other | Admitting: *Deleted

## 2018-02-07 DIAGNOSIS — I495 Sick sinus syndrome: Secondary | ICD-10-CM

## 2018-02-07 NOTE — Progress Notes (Signed)
Remote pacemaker transmission.   

## 2018-02-08 ENCOUNTER — Encounter: Payer: Self-pay | Admitting: Cardiology

## 2018-02-09 LAB — CUP PACEART REMOTE DEVICE CHECK
Battery Impedance: 499 Ohm
Battery Remaining Longevity: 102 mo
Battery Voltage: 2.78 V
Brady Statistic RV Percent Paced: 19 %
Date Time Interrogation Session: 20190429144139
Implantable Lead Implant Date: 20120609
Implantable Lead Location: 753859
Implantable Lead Model: 5076
Lead Channel Impedance Value: 438 Ohm
Lead Channel Impedance Value: 67 Ohm
Lead Channel Setting Pacing Pulse Width: 0.4 ms
MDC IDC LEAD IMPLANT DT: 20120609
MDC IDC LEAD LOCATION: 753860
MDC IDC MSMT LEADCHNL RV PACING THRESHOLD AMPLITUDE: 0.875 V
MDC IDC MSMT LEADCHNL RV PACING THRESHOLD PULSEWIDTH: 0.4 ms
MDC IDC PG IMPLANT DT: 20120609
MDC IDC SET LEADCHNL RV PACING AMPLITUDE: 2.5 V
MDC IDC SET LEADCHNL RV SENSING SENSITIVITY: 2.8 mV

## 2018-02-17 DIAGNOSIS — R600 Localized edema: Secondary | ICD-10-CM | POA: Diagnosis not present

## 2018-02-17 DIAGNOSIS — J449 Chronic obstructive pulmonary disease, unspecified: Secondary | ICD-10-CM | POA: Diagnosis not present

## 2018-02-17 DIAGNOSIS — I482 Chronic atrial fibrillation: Secondary | ICD-10-CM | POA: Diagnosis not present

## 2018-02-17 DIAGNOSIS — K219 Gastro-esophageal reflux disease without esophagitis: Secondary | ICD-10-CM | POA: Diagnosis not present

## 2018-02-22 DIAGNOSIS — R011 Cardiac murmur, unspecified: Secondary | ICD-10-CM | POA: Diagnosis not present

## 2018-02-23 DIAGNOSIS — D518 Other vitamin B12 deficiency anemias: Secondary | ICD-10-CM | POA: Diagnosis not present

## 2018-02-23 DIAGNOSIS — E119 Type 2 diabetes mellitus without complications: Secondary | ICD-10-CM | POA: Diagnosis not present

## 2018-02-23 DIAGNOSIS — E038 Other specified hypothyroidism: Secondary | ICD-10-CM | POA: Diagnosis not present

## 2018-03-17 DIAGNOSIS — K219 Gastro-esophageal reflux disease without esophagitis: Secondary | ICD-10-CM | POA: Diagnosis not present

## 2018-03-17 DIAGNOSIS — I5032 Chronic diastolic (congestive) heart failure: Secondary | ICD-10-CM | POA: Diagnosis not present

## 2018-03-17 DIAGNOSIS — J449 Chronic obstructive pulmonary disease, unspecified: Secondary | ICD-10-CM | POA: Diagnosis not present

## 2018-03-17 DIAGNOSIS — I482 Chronic atrial fibrillation: Secondary | ICD-10-CM | POA: Diagnosis not present

## 2018-04-14 DIAGNOSIS — J449 Chronic obstructive pulmonary disease, unspecified: Secondary | ICD-10-CM | POA: Diagnosis not present

## 2018-04-14 DIAGNOSIS — R600 Localized edema: Secondary | ICD-10-CM | POA: Diagnosis not present

## 2018-04-14 DIAGNOSIS — I482 Chronic atrial fibrillation: Secondary | ICD-10-CM | POA: Diagnosis not present

## 2018-04-14 DIAGNOSIS — R32 Unspecified urinary incontinence: Secondary | ICD-10-CM | POA: Diagnosis not present

## 2018-04-15 DIAGNOSIS — N390 Urinary tract infection, site not specified: Secondary | ICD-10-CM | POA: Diagnosis not present

## 2018-04-27 DIAGNOSIS — E559 Vitamin D deficiency, unspecified: Secondary | ICD-10-CM | POA: Diagnosis not present

## 2018-04-27 DIAGNOSIS — Z79899 Other long term (current) drug therapy: Secondary | ICD-10-CM | POA: Diagnosis not present

## 2018-04-27 DIAGNOSIS — D518 Other vitamin B12 deficiency anemias: Secondary | ICD-10-CM | POA: Diagnosis not present

## 2018-04-27 DIAGNOSIS — E119 Type 2 diabetes mellitus without complications: Secondary | ICD-10-CM | POA: Diagnosis not present

## 2018-04-27 DIAGNOSIS — E7849 Other hyperlipidemia: Secondary | ICD-10-CM | POA: Diagnosis not present

## 2018-04-27 DIAGNOSIS — E038 Other specified hypothyroidism: Secondary | ICD-10-CM | POA: Diagnosis not present

## 2018-05-09 ENCOUNTER — Ambulatory Visit (INDEPENDENT_AMBULATORY_CARE_PROVIDER_SITE_OTHER): Payer: Medicare Other | Admitting: *Deleted

## 2018-05-09 DIAGNOSIS — I495 Sick sinus syndrome: Secondary | ICD-10-CM

## 2018-05-09 NOTE — Progress Notes (Signed)
Remote pacemaker transmission.   

## 2018-05-10 ENCOUNTER — Encounter: Payer: Self-pay | Admitting: Cardiology

## 2018-05-12 DIAGNOSIS — L309 Dermatitis, unspecified: Secondary | ICD-10-CM | POA: Diagnosis not present

## 2018-05-12 DIAGNOSIS — I5032 Chronic diastolic (congestive) heart failure: Secondary | ICD-10-CM | POA: Diagnosis not present

## 2018-05-12 DIAGNOSIS — J449 Chronic obstructive pulmonary disease, unspecified: Secondary | ICD-10-CM | POA: Diagnosis not present

## 2018-05-12 DIAGNOSIS — I482 Chronic atrial fibrillation: Secondary | ICD-10-CM | POA: Diagnosis not present

## 2018-05-13 LAB — CUP PACEART REMOTE DEVICE CHECK
Brady Statistic RV Percent Paced: 19 %
Date Time Interrogation Session: 20190729143938
Implantable Lead Implant Date: 20120609
Implantable Lead Implant Date: 20120609
Implantable Lead Location: 753859
Lead Channel Impedance Value: 412 Ohm
Lead Channel Impedance Value: 67 Ohm
Lead Channel Sensing Intrinsic Amplitude: 5.6 mV
MDC IDC LEAD LOCATION: 753860
MDC IDC MSMT BATTERY IMPEDANCE: 523 Ohm
MDC IDC MSMT BATTERY REMAINING LONGEVITY: 99 mo
MDC IDC MSMT BATTERY VOLTAGE: 2.78 V
MDC IDC MSMT LEADCHNL RV PACING THRESHOLD AMPLITUDE: 0.875 V
MDC IDC MSMT LEADCHNL RV PACING THRESHOLD PULSEWIDTH: 0.4 ms
MDC IDC PG IMPLANT DT: 20120609
MDC IDC SET LEADCHNL RV PACING AMPLITUDE: 2.5 V
MDC IDC SET LEADCHNL RV PACING PULSEWIDTH: 0.4 ms
MDC IDC SET LEADCHNL RV SENSING SENSITIVITY: 2.8 mV

## 2018-05-28 ENCOUNTER — Emergency Department (HOSPITAL_COMMUNITY): Payer: Medicare Other

## 2018-05-28 ENCOUNTER — Other Ambulatory Visit: Payer: Self-pay

## 2018-05-28 ENCOUNTER — Inpatient Hospital Stay (HOSPITAL_COMMUNITY)
Admission: EM | Admit: 2018-05-28 | Discharge: 2018-05-31 | DRG: 194 | Disposition: A | Discharge status: Skilled Nursing Facility | Payer: Medicare Other | Attending: Internal Medicine | Admitting: Internal Medicine

## 2018-05-28 ENCOUNTER — Encounter (HOSPITAL_COMMUNITY): Payer: Self-pay | Admitting: Emergency Medicine

## 2018-05-28 DIAGNOSIS — J440 Chronic obstructive pulmonary disease with acute lower respiratory infection: Secondary | ICD-10-CM | POA: Diagnosis present

## 2018-05-28 DIAGNOSIS — Z7401 Bed confinement status: Secondary | ICD-10-CM | POA: Diagnosis not present

## 2018-05-28 DIAGNOSIS — E876 Hypokalemia: Secondary | ICD-10-CM | POA: Diagnosis not present

## 2018-05-28 DIAGNOSIS — J189 Pneumonia, unspecified organism: Secondary | ICD-10-CM | POA: Diagnosis not present

## 2018-05-28 DIAGNOSIS — G3184 Mild cognitive impairment, so stated: Secondary | ICD-10-CM | POA: Diagnosis present

## 2018-05-28 DIAGNOSIS — J449 Chronic obstructive pulmonary disease, unspecified: Secondary | ICD-10-CM | POA: Diagnosis present

## 2018-05-28 DIAGNOSIS — I69391 Dysphagia following cerebral infarction: Secondary | ICD-10-CM | POA: Diagnosis not present

## 2018-05-28 DIAGNOSIS — J181 Lobar pneumonia, unspecified organism: Secondary | ICD-10-CM | POA: Diagnosis not present

## 2018-05-28 DIAGNOSIS — I481 Persistent atrial fibrillation: Secondary | ICD-10-CM | POA: Diagnosis present

## 2018-05-28 DIAGNOSIS — I4819 Other persistent atrial fibrillation: Secondary | ICD-10-CM | POA: Diagnosis present

## 2018-05-28 DIAGNOSIS — Z87891 Personal history of nicotine dependence: Secondary | ICD-10-CM | POA: Diagnosis not present

## 2018-05-28 DIAGNOSIS — R569 Unspecified convulsions: Secondary | ICD-10-CM

## 2018-05-28 DIAGNOSIS — R0902 Hypoxemia: Secondary | ICD-10-CM | POA: Diagnosis not present

## 2018-05-28 DIAGNOSIS — Z8546 Personal history of malignant neoplasm of prostate: Secondary | ICD-10-CM

## 2018-05-28 DIAGNOSIS — Z923 Personal history of irradiation: Secondary | ICD-10-CM | POA: Diagnosis not present

## 2018-05-28 DIAGNOSIS — G40909 Epilepsy, unspecified, not intractable, without status epilepticus: Secondary | ICD-10-CM | POA: Diagnosis present

## 2018-05-28 DIAGNOSIS — G4733 Obstructive sleep apnea (adult) (pediatric): Secondary | ICD-10-CM | POA: Diagnosis present

## 2018-05-28 DIAGNOSIS — Z66 Do not resuscitate: Secondary | ICD-10-CM | POA: Diagnosis present

## 2018-05-28 DIAGNOSIS — R509 Fever, unspecified: Secondary | ICD-10-CM | POA: Diagnosis not present

## 2018-05-28 DIAGNOSIS — Z951 Presence of aortocoronary bypass graft: Secondary | ICD-10-CM | POA: Diagnosis not present

## 2018-05-28 DIAGNOSIS — I959 Hypotension, unspecified: Secondary | ICD-10-CM | POA: Diagnosis not present

## 2018-05-28 DIAGNOSIS — E785 Hyperlipidemia, unspecified: Secondary | ICD-10-CM | POA: Diagnosis present

## 2018-05-28 DIAGNOSIS — Z79899 Other long term (current) drug therapy: Secondary | ICD-10-CM | POA: Diagnosis not present

## 2018-05-28 DIAGNOSIS — I2510 Atherosclerotic heart disease of native coronary artery without angina pectoris: Secondary | ICD-10-CM | POA: Diagnosis present

## 2018-05-28 DIAGNOSIS — Z880 Allergy status to penicillin: Secondary | ICD-10-CM | POA: Diagnosis not present

## 2018-05-28 DIAGNOSIS — K219 Gastro-esophageal reflux disease without esophagitis: Secondary | ICD-10-CM | POA: Diagnosis present

## 2018-05-28 DIAGNOSIS — J90 Pleural effusion, not elsewhere classified: Secondary | ICD-10-CM | POA: Diagnosis not present

## 2018-05-28 DIAGNOSIS — Z881 Allergy status to other antibiotic agents status: Secondary | ICD-10-CM

## 2018-05-28 DIAGNOSIS — I10 Essential (primary) hypertension: Secondary | ICD-10-CM | POA: Diagnosis present

## 2018-05-28 DIAGNOSIS — I495 Sick sinus syndrome: Secondary | ICD-10-CM | POA: Diagnosis present

## 2018-05-28 DIAGNOSIS — J690 Pneumonitis due to inhalation of food and vomit: Secondary | ICD-10-CM | POA: Diagnosis not present

## 2018-05-28 DIAGNOSIS — F0390 Unspecified dementia without behavioral disturbance: Secondary | ICD-10-CM | POA: Diagnosis present

## 2018-05-28 DIAGNOSIS — Z888 Allergy status to other drugs, medicaments and biological substances status: Secondary | ICD-10-CM | POA: Diagnosis not present

## 2018-05-28 DIAGNOSIS — D72829 Elevated white blood cell count, unspecified: Secondary | ICD-10-CM | POA: Diagnosis not present

## 2018-05-28 DIAGNOSIS — I739 Peripheral vascular disease, unspecified: Secondary | ICD-10-CM | POA: Diagnosis present

## 2018-05-28 DIAGNOSIS — F329 Major depressive disorder, single episode, unspecified: Secondary | ICD-10-CM | POA: Diagnosis present

## 2018-05-28 DIAGNOSIS — Z950 Presence of cardiac pacemaker: Secondary | ICD-10-CM | POA: Diagnosis present

## 2018-05-28 DIAGNOSIS — R1310 Dysphagia, unspecified: Secondary | ICD-10-CM | POA: Diagnosis present

## 2018-05-28 DIAGNOSIS — R413 Other amnesia: Secondary | ICD-10-CM | POA: Diagnosis not present

## 2018-05-28 DIAGNOSIS — I4891 Unspecified atrial fibrillation: Secondary | ICD-10-CM | POA: Diagnosis not present

## 2018-05-28 DIAGNOSIS — R404 Transient alteration of awareness: Secondary | ICD-10-CM | POA: Diagnosis not present

## 2018-05-28 DIAGNOSIS — Z7901 Long term (current) use of anticoagulants: Secondary | ICD-10-CM

## 2018-05-28 DIAGNOSIS — R05 Cough: Secondary | ICD-10-CM | POA: Diagnosis not present

## 2018-05-28 LAB — COMPREHENSIVE METABOLIC PANEL
ALK PHOS: 72 U/L (ref 38–126)
ALT: 18 U/L (ref 0–44)
AST: 24 U/L (ref 15–41)
Albumin: 3.4 g/dL — ABNORMAL LOW (ref 3.5–5.0)
Anion gap: 8 (ref 5–15)
BUN: 21 mg/dL (ref 8–23)
CALCIUM: 8.8 mg/dL — AB (ref 8.9–10.3)
CO2: 28 mmol/L (ref 22–32)
CREATININE: 1.09 mg/dL (ref 0.61–1.24)
Chloride: 108 mmol/L (ref 98–111)
Glucose, Bld: 113 mg/dL — ABNORMAL HIGH (ref 70–99)
Potassium: 3.3 mmol/L — ABNORMAL LOW (ref 3.5–5.1)
Sodium: 144 mmol/L (ref 135–145)
Total Bilirubin: 0.6 mg/dL (ref 0.3–1.2)
Total Protein: 6.3 g/dL — ABNORMAL LOW (ref 6.5–8.1)

## 2018-05-28 LAB — CBC WITH DIFFERENTIAL/PLATELET
BASOS PCT: 0 %
Basophils Absolute: 0 10*3/uL (ref 0.0–0.1)
Eosinophils Absolute: 0 10*3/uL (ref 0.0–0.7)
Eosinophils Relative: 0 %
HEMATOCRIT: 39.9 % (ref 39.0–52.0)
Hemoglobin: 12.4 g/dL — ABNORMAL LOW (ref 13.0–17.0)
Lymphocytes Relative: 7 %
Lymphs Abs: 1.2 10*3/uL (ref 0.7–4.0)
MCH: 29.1 pg (ref 26.0–34.0)
MCHC: 31.1 g/dL (ref 30.0–36.0)
MCV: 93.7 fL (ref 78.0–100.0)
MONOS PCT: 7 %
Monocytes Absolute: 1.3 10*3/uL — ABNORMAL HIGH (ref 0.1–1.0)
NEUTROS ABS: 15.4 10*3/uL — AB (ref 1.7–7.7)
Neutrophils Relative %: 86 %
Platelets: 194 10*3/uL (ref 150–400)
RBC: 4.26 MIL/uL (ref 4.22–5.81)
RDW: 15 % (ref 11.5–15.5)
WBC: 17.9 10*3/uL — ABNORMAL HIGH (ref 4.0–10.5)

## 2018-05-28 LAB — LACTIC ACID, PLASMA
LACTIC ACID, VENOUS: 1.8 mmol/L (ref 0.5–1.9)
LACTIC ACID, VENOUS: 1.1 mmol/L (ref 0.5–1.9)

## 2018-05-28 LAB — URINALYSIS, ROUTINE W REFLEX MICROSCOPIC
Bilirubin Urine: NEGATIVE
GLUCOSE, UA: NEGATIVE mg/dL
HGB URINE DIPSTICK: NEGATIVE
Ketones, ur: NEGATIVE mg/dL
Leukocytes, UA: NEGATIVE
Nitrite: NEGATIVE
Protein, ur: NEGATIVE mg/dL
SPECIFIC GRAVITY, URINE: 1.016 (ref 1.005–1.030)
pH: 7 (ref 5.0–8.0)

## 2018-05-28 LAB — TROPONIN I: Troponin I: 0.03 ng/mL (ref <0.03)

## 2018-05-28 LAB — STREP PNEUMONIAE URINARY ANTIGEN: STREP PNEUMO URINARY ANTIGEN: NEGATIVE

## 2018-05-28 LAB — MRSA PCR SCREENING: MRSA BY PCR: NEGATIVE

## 2018-05-28 LAB — GROUP A STREP BY PCR: Group A Strep by PCR: NOT DETECTED

## 2018-05-28 MED ORDER — POTASSIUM CHLORIDE CRYS ER 20 MEQ PO TBCR
20.0000 meq | EXTENDED_RELEASE_TABLET | Freq: Two times a day (BID) | ORAL | Status: DC
  Administered 2018-05-28 – 2018-05-31 (×6): 20 meq via ORAL
  Filled 2018-05-28 (×6): qty 1

## 2018-05-28 MED ORDER — MOMETASONE FURO-FORMOTEROL FUM 100-5 MCG/ACT IN AERO
2.0000 | INHALATION_SPRAY | Freq: Two times a day (BID) | RESPIRATORY_TRACT | Status: DC
  Administered 2018-05-29 – 2018-05-31 (×5): 2 via RESPIRATORY_TRACT
  Filled 2018-05-28 (×2): qty 8.8

## 2018-05-28 MED ORDER — RISPERIDONE 0.5 MG PO TABS
0.2500 mg | ORAL_TABLET | Freq: Two times a day (BID) | ORAL | Status: DC | PRN
  Administered 2018-05-28 – 2018-05-29 (×2): 0.25 mg via ORAL
  Filled 2018-05-28: qty 1
  Filled 2018-05-28: qty 0.5
  Filled 2018-05-28: qty 1

## 2018-05-28 MED ORDER — TOPIRAMATE 25 MG PO TABS
50.0000 mg | ORAL_TABLET | Freq: Every day | ORAL | Status: DC
  Administered 2018-05-29 – 2018-05-31 (×3): 50 mg via ORAL
  Filled 2018-05-28 (×3): qty 2

## 2018-05-28 MED ORDER — SODIUM CHLORIDE 0.9 % IV BOLUS
500.0000 mL | Freq: Once | INTRAVENOUS | Status: AC
  Administered 2018-05-28: 500 mL via INTRAVENOUS

## 2018-05-28 MED ORDER — FLUOXETINE HCL 20 MG PO CAPS
40.0000 mg | ORAL_CAPSULE | Freq: Every day | ORAL | Status: DC
  Administered 2018-05-28 – 2018-05-31 (×4): 40 mg via ORAL
  Filled 2018-05-28 (×3): qty 2
  Filled 2018-05-28: qty 4

## 2018-05-28 MED ORDER — POTASSIUM CHLORIDE CRYS ER 20 MEQ PO TBCR
40.0000 meq | EXTENDED_RELEASE_TABLET | Freq: Once | ORAL | Status: AC
  Administered 2018-05-28: 40 meq via ORAL
  Filled 2018-05-28: qty 2

## 2018-05-28 MED ORDER — IPRATROPIUM-ALBUTEROL 0.5-2.5 (3) MG/3ML IN SOLN
3.0000 mL | Freq: Four times a day (QID) | RESPIRATORY_TRACT | Status: DC
  Administered 2018-05-28 – 2018-05-29 (×5): 3 mL via RESPIRATORY_TRACT
  Filled 2018-05-28 (×4): qty 3

## 2018-05-28 MED ORDER — IPRATROPIUM-ALBUTEROL 0.5-2.5 (3) MG/3ML IN SOLN
RESPIRATORY_TRACT | Status: AC
  Filled 2018-05-28: qty 3

## 2018-05-28 MED ORDER — SODIUM CHLORIDE 0.9 % IV SOLN
INTRAVENOUS | Status: DC
  Administered 2018-05-28 (×2): via INTRAVENOUS

## 2018-05-28 MED ORDER — SODIUM CHLORIDE 0.9 % IV SOLN
INTRAVENOUS | Status: DC
  Administered 2018-05-28: 12:00:00 via INTRAVENOUS

## 2018-05-28 MED ORDER — SODIUM CHLORIDE 0.9 % IV SOLN
2.0000 g | Freq: Three times a day (TID) | INTRAVENOUS | Status: DC
  Administered 2018-05-28 – 2018-05-29 (×3): 2 g via INTRAVENOUS
  Filled 2018-05-28 (×7): qty 2

## 2018-05-28 MED ORDER — DILTIAZEM HCL ER COATED BEADS 180 MG PO CP24
180.0000 mg | ORAL_CAPSULE | Freq: Two times a day (BID) | ORAL | Status: DC
  Administered 2018-05-28 – 2018-05-31 (×6): 180 mg via ORAL
  Filled 2018-05-28 (×10): qty 1

## 2018-05-28 MED ORDER — BUPROPION HCL 75 MG PO TABS
75.0000 mg | ORAL_TABLET | Freq: Two times a day (BID) | ORAL | Status: DC
  Administered 2018-05-28 – 2018-05-31 (×5): 75 mg via ORAL
  Filled 2018-05-28 (×12): qty 1

## 2018-05-28 MED ORDER — TRAZODONE HCL 50 MG PO TABS
50.0000 mg | ORAL_TABLET | Freq: Every evening | ORAL | Status: DC | PRN
  Administered 2018-05-28 – 2018-05-29 (×2): 50 mg via ORAL
  Filled 2018-05-28 (×2): qty 1

## 2018-05-28 MED ORDER — ATORVASTATIN CALCIUM 40 MG PO TABS
40.0000 mg | ORAL_TABLET | Freq: Every day | ORAL | Status: DC
  Administered 2018-05-28 – 2018-05-31 (×4): 40 mg via ORAL
  Filled 2018-05-28 (×3): qty 1

## 2018-05-28 MED ORDER — PANTOPRAZOLE SODIUM 40 MG PO TBEC
40.0000 mg | DELAYED_RELEASE_TABLET | Freq: Every day | ORAL | Status: DC
  Administered 2018-05-29 – 2018-05-31 (×3): 40 mg via ORAL
  Filled 2018-05-28 (×3): qty 1

## 2018-05-28 MED ORDER — SODIUM CHLORIDE 0.9 % IV SOLN
2000.0000 mg | Freq: Once | INTRAVENOUS | Status: AC
  Administered 2018-05-28: 2000 mg via INTRAVENOUS
  Filled 2018-05-28: qty 2000

## 2018-05-28 MED ORDER — APIXABAN 5 MG PO TABS
5.0000 mg | ORAL_TABLET | Freq: Two times a day (BID) | ORAL | Status: DC
  Administered 2018-05-28 – 2018-05-31 (×6): 5 mg via ORAL
  Filled 2018-05-28 (×2): qty 2
  Filled 2018-05-28 (×3): qty 1
  Filled 2018-05-28: qty 2

## 2018-05-28 NOTE — ED Notes (Signed)
CRITICAL VALUE ALERT  Critical Value:  Trop 0.03  Date & Time Notied:  05/28/18, 1155  Provider Notified: Dr. Thurnell Garbe  Orders Received/Actions taken: no new orders at this time

## 2018-05-28 NOTE — ED Provider Notes (Signed)
[facility] EMERGENCY DEPARTMENT Provider Note   CSN: [id number] Arrival date & time: 05/28/18  1003     History   Chief Complaint Chief Complaint  Patient presents with  . Fever    HPI Zachary Warren is a 84 y.o. male.  The history is provided by the EMS personnel and the nursing home. The history is limited by the condition of the patient (hx dementia).  Fever    Pt was seen at 1040. Per EMS and NH report: NH states pt has had "fever, cough, congestion and urinary incont." Reported temp of "104" at facility and given APAP before transport to ED. Pt states he "feels fine." Denies CP/SOB, no back pain, no abd pain, no N/V/D.    Past Medical History:  Diagnosis Date  . COPD (chronic obstructive pulmonary disease) ([place])   . Coronary artery disease    s/p 5v CABG 2002  . CVA (cerebral infarction) 1999  . DDD (degenerative disc disease), cervical   . Dementia   . Depression   . GERD (gastroesophageal reflux disease)   . History of kidney stones   . Hyperlipidemia   . Hypertension   . Memory impairment   . OSA (obstructive sleep apnea)    Recently diagnosed though pt. has not had CPAP trial  . Permanent atrial fibrillation (HCC)    chads2vasc score is 6  . Pneumonia    recent hospital admission for pneumonia   . Prostate cancer ([place]) 2006   s/p XRT  . PVD (peripheral vascular disease) ([place])   . Seizures ([place])   . Tachycardia-bradycardia [facility])    s/p PPM by [facility] 6/12    Patient Active Problem List   Diagnosis Date Noted  . Tachycardia-bradycardia ([place])   . Seizures ([place])   . PVD (peripheral vascular disease) ([place])   . Pneumonia   . Persistent atrial fibrillation ([place])   . Pacemaker   . OSA (obstructive sleep apnea)   . Memory impairment   . Hypertension   . History of kidney stones   . Depression   . Dementia   . DDD (degenerative disc disease), cervical   . Coronary artery disease   . COPD (chronic obstructive pulmonary disease) ([place])   . LLL pneumonia  ([place]) 07/11/2017  . HCAP (healthcare-associated pneumonia) 07/11/2017  . Fever 07/10/2017  . Hypokalemia 10/25/2014  . Sepsis ([place]) 10/24/2014  . Atrial fibrillation with RVR ([place]) 10/24/2014  . MCI (mild cognitive impairment) with memory loss 07/24/2014  . Cardiac pacemaker in situ 12/25/2013  . Aneurysm ([place]) 06/28/2012  . Encephalopathy acute 02/05/2012  . CVA (cerebral infarction) 02/05/2012  . Seizure ([place]) 02/05/2012  . Dysphagia as late effect of stroke 02/05/2012  . GERD (gastroesophageal reflux disease) 02/05/2012  . 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 Hyperlipidemia 11/21/2010  . Essential hypertension 11/21/2010  . CAD, NATIVE VESSEL 11/21/2010  . Atrial fibrillation ([place]) 11/21/2010  . PVD 11/21/2010  . Prostate cancer ([place]) 10/12/2004    Past Surgical History:  Procedure Laterality Date  . CARDIOVERSION N/A 08/21/2013   Procedure: CARDIOVERSION;  Surgeon: Candee Furbish, MD;  Location: [facility];  Service: Cardiovascular;  Laterality: N/A;  . [place]  . CORONARY ARTERY BYPASS GRAFT  2002  . CYSTOSCOPY W/ URETERAL STENT PLACEMENT Left 05/13/2017   Procedure: CYSTOSCOPY WITH RETROGRADE PYELOGRAM/URETERAL STENT PLACEMENT LEFT;  Surgeon: Alexis Frock, MD;  [facility];  Service: Urology;  Laterality: Left;  . CYSTOSCOPY WITH RETROGRADE PYELOGRAM, URETEROSCOPY AND STENT PLACEMENT Left 06/09/2017  Procedure: CYSTOSCOPY WITH RETROGRADE PYELOGRAM, URETEROSCOPY;  Surgeon: Alexis Frock, MD;  [facility];  Service: Urology;  Laterality: Left;  . HOLMIUM LASER APPLICATION Left 7/74/1287   Procedure: HOLMIUM LASER APPLICATION;  Surgeon: Alexis Frock, MD;  [facility];  Service: Urology;  Laterality: Left;  . INSERT / REPLACE / REMOVE PACEMAKER    . NECK SURGERY     hx of   . PACEMAKER INSERTION     implanted by [facility] 6/12        Home Medications    Prior to Admission medications   Medication Sig Start Date End Date Taking? Authorizing  Provider  acetaminophen (TYLENOL) 500 MG tablet Take 500 mg by mouth daily as needed for mild pain or moderate pain.    [provider]  apixaban (ELIQUIS) 5 MG TABS tablet Take 1 tablet (5 mg total) by mouth 2 (two) times daily. 10/27/17   Allred, Jeneen Rinks, MD  atorvastatin (LIPITOR) 40 MG tablet TAKE 1 TABLET BY MOUTH EVERY DAY - PATIENT DUE FOR LIPID PANEL Patient taking differently: TAKE 1 TABLET BY MOUTH EVERY DAY EVERY EVENING - PATIENT DUE FOR LIPID PANEL 06/01/17   Jerline Pain, MD  budesonide-formoterol (SYMBICORT) 80-4.5 MCG/ACT inhaler Inhale 2 puffs into the lungs daily.    [provider]  buPROPion (WELLBUTRIN) 75 MG tablet Take 75 mg by mouth 2 (two) times daily. 08/31/17   [provider]  calcium carbonate (TUMS - DOSED IN MG ELEMENTAL CALCIUM) 500 MG chewable tablet Chew 1 tablet by mouth 2 (two) times daily.    [provider]  cholecalciferol (VITAMIN D) 1000 units tablet Take 1,000 Units by mouth daily.    [provider]  diltiazem (CARTIA XT) 180 MG 24 hr capsule Take 1 capsule (180 mg total) by mouth 2 (two) times daily. Please keep upcoming appt for future refills. Thank you 10/27/17   Thompson Grayer, MD  FLUoxetine (PROZAC) 40 MG capsule Take 40 mg by mouth daily.      [provider]  furosemide (LASIX) 20 MG tablet Take 2 tablets (40 mg total) by mouth daily. Patient taking differently: Take 20 mg by mouth daily.  07/13/17   Johnson, Clanford L, MD  guaiFENesin (MUCINEX) 600 MG 12 hr tablet Take 600 mg by mouth 2 (two) times daily.    [provider]  ipratropium-albuterol (DUONEB) 0.5-2.5 (3) MG/3ML SOLN Take 3 mLs by nebulization every 6 (six) hours. Patient taking differently: Take 3 mLs by nebulization daily.  07/13/17   Johnson, Clanford L, MD  pantoprazole (PROTONIX) 40 MG tablet Take 40 mg by mouth daily. 10/26/17   [provider]  potassium chloride SA (K-DUR,KLOR-CON) 20 MEQ tablet Take 20 mEq by  mouth 2 (two) times daily.  08/31/17   [provider]  risperiDONE (RISPERDAL) 0.25 MG tablet Take 0.25 mg by mouth 2 (two) times daily as needed. 08/31/17   [provider]  topiramate (TOPAMAX) 50 MG tablet Take 1 tablet (50 mg total) by mouth 2 (two) times daily. Patient taking differently: Take 50 mg by mouth daily.  07/25/15   Penumalli, Earlean Polka, MD  traZODone (DESYREL) 50 MG tablet Take 50 mg by mouth at bedtime as needed. 08/31/17   [provider]    Family History Family History  Problem Relation Age of Onset  . Cancer Mother        ovarian  . Ovarian cancer Mother   . Cancer Father  pancreatic  . Pancreatic cancer Father   . Cancer Brother        lung  . Ovarian cancer Unknown   . Pancreatic cancer Unknown   . Lung cancer Unknown     Social History Social History   Tobacco Use  . Smoking status: Former Smoker    Packs/day: 2.00    Years: 48.00    Pack years: 96.00    Types: Cigarettes    Last attempt to quit: 03/09/1998    Years since quitting: 20.2  . Smokeless tobacco: Never Used  Substance Use Topics  . Alcohol use: No    Comment: quit: 1999  . Drug use: No     Allergies   Aricept [donepezil hcl]; Ceclor [cefaclor]; Namenda [memantine hcl]; Penicillins; and Ramipril   Review of Systems Review of Systems  Unable to perform ROS: Dementia  Constitutional: Positive for fever.     Physical Exam Updated Vital Signs BP 110/61 (BP Location: Left Arm)   Pulse 82   Temp 99.2 F (37.3 C) (Oral)   Resp 16   Ht 6' (1.829 m)   Wt 91.5 kg   SpO2 92%   BMI 27.36 kg/m    Patient Vitals for the past 24 hrs:  BP Temp Temp src Pulse Resp SpO2 Height Weight  05/28/18 1334 - - - - - 92 % - -  05/28/18 1317 112/61 - - 72 14 92 % - -  05/28/18 1230 (!) 99/53 - - 62 16 92 % - -  05/28/18 1200 101/64 - - 84 16 93 % - -  05/28/18 1130 (!) 97/58 - - 67 (!) 25 92 % - -  05/28/18 1100 (!) 98/49 - - 70 17 92 % - -  05/28/18  1030 (!) 100/59 - - 69 16 91 % - -  05/28/18 1009 110/61 99.2 F (37.3 C) Oral 82 16 92 % - -  05/28/18 1008 - - - - - - 6' (1.829 m) 91.5 kg     Physical Exam 1045: Physical examination:  Nursing notes reviewed; Vital signs and O2 SAT reviewed;  Constitutional: Well developed, Well nourished, In no acute distress; Head:  Normocephalic, atraumatic; Eyes: EOMI, PERRL, No scleral icterus; ENMT: Mouth and pharynx normal, Mucous membranes dry. Mouth and pharynx without lesions. No tonsillar exudates. No intra-oral edema. No submandibular or sublingual edema. No hoarse voice, no drooling, no stridor. No pain with manipulation of larynx. No trismus.;;; Neck: Supple, Full range of motion, No lymphadenopathy; Cardiovascular: Regular rate and rhythm, No gallop; Respiratory: Breath sounds coarse & equal bilaterally, No wheezes.  Speaking full sentences with ease, Normal respiratory effort/excursion; Chest: Nontender, Movement normal; Abdomen: Soft, Nontender, Nondistended, Normal bowel sounds; Genitourinary: No CVA tenderness; Extremities: Peripheral pulses normal, No tenderness, +2 pedal edema bilat. No calf asymmetry.; Neuro: Awake, alert, confused per hx dementia. No facial droop. Speech clear. No gross focal motor deficits in extremities.; Skin: Color normal, Warm, Dry.   ED Treatments / Results  Labs (all labs ordered are listed, but only abnormal results are displayed)   EKG EKG Interpretation  Date/Time:  Saturday May 28 2018 11:15:40 EDT Ventricular Rate:  103 PR Interval:    QRS Duration: 133 QT Interval:  431 QTC Calculation: 498 R Axis:   76 Text Interpretation:  Afib/flut and V-paced complexes No further rhythm analysis attempted due to paced rhythm IVCD, consider atypical LBBB Artifact When compared with ECG of 08/10/2017 Artifact is present no apparent acute changes Repeat tracings suggested  Confirmed by Francine Graven [phone]) on 05/28/2018 11:28:10 AM    EKG  Interpretation  Date/Time:  Saturday May 28 2018 11:19:40 EDT Ventricular Rate:  72 PR Interval:    QRS Duration: 94 QT Interval:  512 QTC Calculation: 617 R Axis:   58 Text Interpretation:  Afib/flut and V-paced complexes No further rhythm analysis attempted due to paced rhythm Low voltage, extremity and precordial leads Anteroseptal infarct, old Borderline repolarization abnormality Prolonged QT interval Since last tracing of earlier today ventricular-paced complexes are present Confirmed by Francine Graven [phone]) on 05/28/2018 11:29:16 AM          Radiology   Procedures Procedures (including critical care time)  Medications Ordered in ED Medications - No data to display   Initial Impression / Assessment and Plan / ED Course  I have reviewed the triage vital signs and the nursing notes.  Pertinent labs & imaging results that were available during my care of the patient were reviewed by me and considered in my medical decision making (see chart for details).  MDM Reviewed: previous chart, nursing note and vitals Reviewed previous: labs and ECG Interpretation: labs, x-ray and ECG    Results for orders placed or performed during the hospital encounter of 05/28/18  Group A Strep by PCR  Result Value Ref Range   Group A Strep by PCR NOT DETECTED NOT DETECTED  Urinalysis, Routine w reflex microscopic  Result Value Ref Range   Color, Urine YELLOW YELLOW   APPearance CLEAR CLEAR   Specific Gravity, Urine 1.016 1.005 - 1.030   pH 7.0 5.0 - 8.0   Glucose, UA NEGATIVE NEGATIVE mg/dL   Hgb urine dipstick NEGATIVE NEGATIVE   Bilirubin Urine NEGATIVE NEGATIVE   Ketones, ur NEGATIVE NEGATIVE mg/dL   Protein, ur NEGATIVE NEGATIVE mg/dL   Nitrite NEGATIVE NEGATIVE   Leukocytes, UA NEGATIVE NEGATIVE  Comprehensive metabolic panel  Result Value Ref Range   Sodium 144 135 - 145 mmol/L   Potassium 3.3 (L) 3.5 - 5.1 mmol/L   Chloride 108 98 - 111 mmol/L   CO2 28 22 - 32  mmol/L   Glucose, Bld 113 (H) 70 - 99 mg/dL   BUN 21 8 - 23 mg/dL   Creatinine, Ser 1.09 0.61 - 1.24 mg/dL   Calcium 8.8 (L) 8.9 - 10.3 mg/dL   Total Protein 6.3 (L) 6.5 - 8.1 g/dL   Albumin 3.4 (L) 3.5 - 5.0 g/dL   AST 24 15 - 41 U/L   ALT 18 0 - 44 U/L   Alkaline Phosphatase 72 38 - 126 U/L   Total Bilirubin 0.6 0.3 - 1.2 mg/dL   GFR calc non Af Amer >60 >60 mL/min   GFR calc Af Amer >60 >60 mL/min   Anion gap 8 5 - 15  Lactic acid, plasma  Result Value Ref Range   Lactic Acid, Venous 1.8 0.5 - 1.9 mmol/L  Troponin I  Result Value Ref Range   Troponin I 0.03 (HH) <0.03 ng/mL  CBC with Differential  Result Value Ref Range   WBC 17.9 (H) 4.0 - 10.5 K/uL   RBC 4.26 4.22 - 5.81 MIL/uL   Hemoglobin 12.4 (L) 13.0 - 17.0 g/dL   HCT 39.9 39.0 - 52.0 %   MCV 93.7 78.0 - 100.0 fL   MCH 29.1 26.0 - 34.0 pg   MCHC 31.1 30.0 - 36.0 g/dL   RDW 15.0 11.5 - 15.5 %   Platelets 194 150 - 400 K/uL   Neutrophils Relative %  86 %   Neutro Abs 15.4 (H) 1.7 - 7.7 K/uL   Lymphocytes Relative 7 %   Lymphs Abs 1.2 0.7 - 4.0 K/uL   Monocytes Relative 7 %   Monocytes Absolute 1.3 (H) 0.1 - 1.0 K/uL   Eosinophils Relative 0 %   Eosinophils Absolute 0.0 0.0 - 0.7 K/uL   Basophils Relative 0 %   Basophils Absolute 0.0 0.0 - 0.1 K/uL   Dg Chest 2 View Result Date: 05/28/2018 CLINICAL DATA:  Fever and cough. EXAM: CHEST - 2 VIEW COMPARISON:  Chest x-ray dated August 10, 2017. FINDINGS: Unchanged left chest wall pacemaker. Stable cardiomegaly status post CABG. Normal pulmonary vascularity. Atherosclerotic calcification of the aortic arch. Increased patchy density in the left lower lobe. Small left pleural effusion. The right lung is clear. No pneumothorax. No acute osseous abnormality. IMPRESSION: 1. Patchy airspace disease in the left lower lobe, suspicious for pneumonia. 2. Small left pleural effusion. Electronically Signed   By: Titus Dubin M.D.   On: 05/28/2018 12:32    1325:  IV abx started  for HCAP. Judicious IVF boluses given for soft BP with improvement. T/C returned from [facility] Dr. Wynetta Emery, case discussed, including:  HPI, pertinent PM/SHx, VS/PE, dx testing, ED course and treatment:  Agreeable to admit.    Final Clinical Impressions(s) / ED Diagnoses   Final diagnoses:  None    ED Discharge Orders    None       Francine Graven, DO 06/01/18 1611

## 2018-05-28 NOTE — ED Triage Notes (Signed)
Pt from [facility] for evaluation of fever, cough, congestion, and urinary incontinence.  Pt reported to have temp of 104 and was given tylenol prior to leaving facility.  Pt denies any complaints.  Does have dementia.

## 2018-05-28 NOTE — ED Notes (Signed)
Bladder scan prior to I&O cath   389 in bladder  I&O with return of 360  Specimen to lab

## 2018-05-28 NOTE — H&P (Addendum)
History and Physical  Zachary Warren ZOX:[id number] DOB: 09-20-1933 DOA: 05/28/2018  Referring physician: Thurnell Garbe PCP: Wenda Low, MD   Chief Complaint: Fever  Historian: ED records, EMS records, Pt has dementia.   HPI: Zachary Warren is a 84 y.o. male nursing home resident with dementia was sent to the emergency department today with symptoms of fever up to 104, cough and chest congestion and some urinary incontinence.  The patient has fairly advanced dementia and unable to provide adequate history.  The patient denies any complaints.  The patient had been admitted approximately 1 year ago with a left lower lobe pneumonia.  The patient is residing at [facility].  The patient was evaluated in the emergency department and noted to have a left lower lobe pneumonia.  The patient had a leukocytosis with a white blood cell count of 17.4.  Patient was hypokalemic and was started on gentle hydration for soft blood pressures.  The lactic acid came back within normal limits.  The patient was started on broad-spectrum antibiotic coverage.  The patient did report a sore throat and a strep screen was obtained and was negative.  The urinalysis was negative for infection and urine culture was requested.  The patient is being admitted into the hospital due to his advanced age and multiple comorbidities and high mortality associated with pneumonia.  His vitals are stable at this time.   Review of Systems: Unable to obtain due to dementia.   Past Medical History:  Diagnosis Date  . COPD (chronic obstructive pulmonary disease) ([place])   . Coronary artery disease    s/p 5v CABG 2002  . CVA (cerebral infarction) 1999  . DDD (degenerative disc disease), cervical   . Dementia   . Depression   . GERD (gastroesophageal reflux disease)   . History of kidney stones   . Hyperlipidemia   . Hypertension   . Memory impairment   . OSA (obstructive sleep apnea)    Recently  diagnosed though pt. has not had CPAP trial  . Permanent atrial fibrillation (HCC)    chads2vasc score is 6  . Pneumonia    recent hospital admission for pneumonia   . Prostate cancer ([place]) 2006   s/p XRT  . PVD (peripheral vascular disease) ([place])   . Seizures ([place])   . Tachycardia-bradycardia [facility])    s/p PPM by [facility] 6/12   Past Surgical History:  Procedure Laterality Date  . CARDIOVERSION N/A 08/21/2013   Procedure: CARDIOVERSION;  Surgeon: Candee Furbish, MD;  Location: [place];  Service: Cardiovascular;  Laterality: N/A;  . [place]  . CORONARY ARTERY BYPASS GRAFT  2002  . CYSTOSCOPY W/ URETERAL STENT PLACEMENT Left 05/13/2017   Procedure: CYSTOSCOPY WITH RETROGRADE PYELOGRAM/URETERAL STENT PLACEMENT LEFT;  Surgeon: Alexis Frock, MD;  [facility];  Service: Urology;  Laterality: Left;  . CYSTOSCOPY WITH RETROGRADE PYELOGRAM, URETEROSCOPY AND STENT PLACEMENT Left 06/09/2017   Procedure: CYSTOSCOPY WITH RETROGRADE PYELOGRAM, URETEROSCOPY;  Surgeon: Alexis Frock, MD;  [facility];  Service: Urology;  Laterality: Left;  . HOLMIUM LASER APPLICATION Left 05/22/9146   Procedure: HOLMIUM LASER APPLICATION;  Surgeon: Alexis Frock, MD;  [facility];  Service: Urology;  Laterality: Left;  . INSERT / REPLACE / REMOVE PACEMAKER    . NECK SURGERY     hx of   . PACEMAKER INSERTION     implanted by [facility] 6/12   Social History:  reports that he quit smoking  about 20 years ago. His smoking use included cigarettes. He has a 96.00 pack-year smoking history. He has never used smokeless tobacco. He reports that he does not drink alcohol or use drugs.  Allergies  Allergen Reactions  . Aricept [Donepezil Hcl] Other (See Comments)    "stomach pain", generic  . Ceclor [Cefaclor]   . Namenda [Memantine Hcl] Other (See Comments)    "dizzy", brand medication  . Penicillins     Has patient had a PCN reaction causing immediate rash, facial/tongue/throat swelling, SOB or lightheadedness  with hypotension: Unknown Has patient had a PCN reaction causing severe rash involving mucus membranes or skin necrosis: Unknown Has patient had a PCN reaction that required hospitalization: Unknown Has patient had a PCN reaction occurring within the last 10 years: Unknown If all of the above answers are "NO", then may proceed with Cephalosporin use.   . Ramipril Other (See Comments)     cough    Family History  Problem Relation Age of Onset  . Cancer Mother        ovarian  . Ovarian cancer Mother   . Cancer Father        pancreatic  . Pancreatic cancer Father   . Cancer Brother        lung  . Ovarian cancer Unknown   . Pancreatic cancer Unknown   . Lung cancer Unknown     Prior to Admission medications   Medication Sig Start Date End Date Taking? Authorizing Provider  acetaminophen (TYLENOL) 325 MG tablet Take 500 mg by mouth every 6 (six) hours as needed.   Yes [provider]  apixaban (ELIQUIS) 5 MG TABS tablet Take 1 tablet (5 mg total) by mouth 2 (two) times daily. 10/27/17  Yes Allred, Jeneen Rinks, MD  atorvastatin (LIPITOR) 40 MG tablet TAKE 1 TABLET BY MOUTH EVERY DAY - PATIENT DUE FOR LIPID PANEL 06/01/17  Yes Jerline Pain, MD  budesonide-formoterol (SYMBICORT) 80-4.5 MCG/ACT inhaler Inhale 2 puffs into the lungs daily.   Yes [provider]  buPROPion (WELLBUTRIN) 75 MG tablet Take 75 mg by mouth 2 (two) times daily. 08/31/17  Yes [provider]  calcium carbonate (TUMS - DOSED IN MG ELEMENTAL CALCIUM) 500 MG chewable tablet Chew 1 tablet by mouth 2 (two) times daily.   Yes [provider]  cholecalciferol (VITAMIN D) 1000 units tablet Take 1,000 Units by mouth daily.   Yes [provider]  diltiazem (CARTIA XT) 180 MG 24 hr capsule Take 1 capsule (180 mg total) by mouth 2 (two) times daily. Please keep upcoming appt for future refills. Thank you 10/27/17  Yes Allred, Jeneen Rinks, MD  FLUoxetine (PROZAC) 40 MG capsule Take 40 mg by mouth  daily.     Yes [provider]  furosemide (LASIX) 20 MG tablet Take 2 tablets (40 mg total) by mouth daily. Patient taking differently: Take 20 mg by mouth daily.  07/13/17  Yes Johnson, Clanford L, MD  Multiple Vitamin (THEREMS PO) Take 1 tablet by mouth daily.   Yes [provider]  pantoprazole (PROTONIX) 40 MG tablet Take 40 mg by mouth daily. 10/26/17  Yes [provider]  potassium chloride SA (K-DUR,KLOR-CON) 20 MEQ tablet Take 20 mEq by mouth 2 (two) times daily.  08/31/17  Yes [provider]  risperiDONE (RISPERDAL) 0.25 MG tablet Take 0.25 mg by mouth 2 (two) times daily as needed. 08/31/17  Yes [provider]  topiramate (TOPAMAX) 50 MG tablet Take 1 tablet (50 mg  total) by mouth 2 (two) times daily. Patient taking differently: Take 50 mg by mouth daily.  07/25/15  Yes Penumalli, Earlean Polka, MD  traZODone (DESYREL) 50 MG tablet Take 50 mg by mouth at bedtime as needed. 08/31/17   [provider]   Physical Exam: Vitals:   05/28/18 1130 05/28/18 1200 05/28/18 1230 05/28/18 1317  BP: (!) 97/58 101/64 (!) 99/53 112/61  Pulse: 67 84 62 72  Resp: (!) 25 16 16 14   Temp:      TempSrc:      SpO2: 92% 93% 92% 92%  Weight:      Height:         General exam: Moderately built and nourished patient, lying comfortably supine on the gurney in no obvious distress. Pt is demented with slow speech patterns.  Oriented to person only.    Head, eyes and ENT: Nontraumatic and normocephalic. Pupils equally reacting to light and accommodation. Oral mucosa dry.  Neck: Supple. No JVD, carotid bruit or thyromegaly.  Lymphatics: No lymphadenopathy.  Respiratory system: Rales LUL, LML, LLL heard. No increased work of breathing.  Cardiovascular system: S1 and S2 heard, irregular. No JVD, murmurs, gallops, clicks or pedal edema.  Gastrointestinal system: Abdomen is nondistended, soft and nontender. Normal bowel sounds heard. No organomegaly or  masses appreciated.  Central nervous system: Alert and oriented. No focal neurological deficits.  Extremities: Symmetric 5 x 5 power. Peripheral pulses symmetrically felt.   Skin: No rashes or acute findings.  Musculoskeletal system: Negative exam.  Psychiatry: Pleasant and cooperative.  Labs on Admission:  Basic Metabolic Panel: Recent Labs  Lab 05/28/18 1108  NA 144  K 3.3*  CL 108  CO2 28  GLUCOSE 113*  BUN 21  CREATININE 1.09  CALCIUM 8.8*   Liver Function Tests: Recent Labs  Lab 05/28/18 1108  AST 24  ALT 18  ALKPHOS 72  BILITOT 0.6  PROT 6.3*  ALBUMIN 3.4*   No results for input(s): LIPASE, AMYLASE in the last 168 hours. No results for input(s): AMMONIA in the last 168 hours. CBC: Recent Labs  Lab 05/28/18 1108  WBC 17.9*  NEUTROABS 15.4*  HGB 12.4*  HCT 39.9  MCV 93.7  PLT 194   Cardiac Enzymes: Recent Labs  Lab 05/28/18 1108  TROPONINI 0.03*    BNP (last 3 results) No results for input(s): PROBNP in the last 8760 hours. CBG: No results for input(s): GLUCAP in the last 168 hours.  Radiological Exams on Admission: Dg Chest 2 View  Result Date: 05/28/2018 CLINICAL DATA:  Fever and cough. EXAM: CHEST - 2 VIEW COMPARISON:  Chest x-ray dated August 10, 2017. FINDINGS: Unchanged left chest wall pacemaker. Stable cardiomegaly status post CABG. Normal pulmonary vascularity. Atherosclerotic calcification of the aortic arch. Increased patchy density in the left lower lobe. Small left pleural effusion. The right lung is clear. No pneumothorax. No acute osseous abnormality. IMPRESSION: 1. Patchy airspace disease in the left lower lobe, suspicious for pneumonia. 2. Small left pleural effusion. Electronically Signed   By: Titus Dubin M.D.   On: 05/28/2018 12:32   Assessment/Plan Principal Problem:   LLL pneumonia (HCC) Active Problems:   Fever   Pneumonia   Essential hypertension   Dysphagia as late effect of stroke   GERD (gastroesophageal  reflux disease)   MCI (mild cognitive impairment) with memory loss   Hypokalemia   Tachycardia-bradycardia ([place])   Seizures (HCC)   PVD (peripheral vascular disease) ([place])   Persistent atrial fibrillation ([place])  Pacemaker   OSA (obstructive sleep apnea)   Memory impairment   Coronary artery disease   COPD (chronic obstructive pulmonary disease) ([place])   1. Left lower lobe pneumonia-suspect he likely has aspirated given his advanced dementia.  Aspiration precautions ordered.  Elevate head of the bed.  Dysphagia 2 diet.  Consult SLP for full dysphagia evaluation.  Continue broad-spectrum antibiotics with plan to de-escalate as he clinically improves.  Supportive care with oxygen ordered continue gentle IV fluid hydration. 2. Chronic atrial fibrillation- he is anticoagulated with Eliquis.  His rate is controlled at this time.  He does have a history of tachybradycardia syndrome.  We will continue to monitor. 3. OSA- he is not currently on CPAP. 4. Dementia- fairly stable he is pleasant will monitor closely for signs of acute delirium.  He is at high risk. 5. COPD-duo nebs ordered every 6 hours. 6. Hypokalemia- oral replacement ordered.  Follow magnesium and replenish as needed. 7. CAD-stable. 8. Epilepsy-resume home medications. 9. GERD-Protonix ordered for GI protection. 10. Dysphagia-as noted above late effect of prior stroke, suspect he has aspirated and will ask for SLP to evaluate.  For now, dysphagia 2 diet is been ordered.  Nectar thickened liquids. 11. DNR -patient has DNR paperwork from his facility.  Will continue DNR while in hospital.  DVT Prophylaxis: Apixaban Code Status: DNR Family Communication: No one present at bedside Disposition Plan: Return to SNF when medically stable  Time spent: 59 minutes  Irwin Brakeman, MD [facility] Pager [phone]  If 7PM-7AM, please contact night-coverage [email or web address] Password [email or web address] 05/28/2018, 1:38 PM

## 2018-05-28 NOTE — ED Notes (Signed)
Critical trop  0.03  Dr Tonye Becket informed

## 2018-05-28 NOTE — Progress Notes (Addendum)
Pharmacy Antibiotic Note  Zachary Warren is a 84 y.o. male admitted on 05/28/2018 with pneumonia.  Pharmacy has been consulted for Unasyn dosing. Pharmacy was previously consulted to dose vancomycin due to PCN allergy, but patient has previously taken both Zosyn and Augmentin. MRSA PCR screen is negative.  Plan: D/C Vancomycin and Aztreonam Start Unasyn 3g IV q6h Pharmacy will continue to monitor renal function, cultures and patient progress.    Height: 6' (182.9 cm) Weight: 201 lb 11.5 oz (91.5 kg) IBW/kg (Calculated) : 77.6  Temp (24hrs), Avg:99.2 F (37.3 C), Min:99.2 F (37.3 C), Max:99.2 F (37.3 C)  Recent Labs  Lab 05/28/18 1108 05/28/18 1302  WBC 17.9*  --   CREATININE 1.09  --   LATICACIDVEN 1.8 1.1    Estimated Creatinine Clearance: 56.4 mL/min (by C-G formula based on SCr of 1.09 mg/dL).    Allergies  Allergen Reactions  . Aricept [Donepezil Hcl] Other (See Comments)    "stomach pain", generic  . Ceclor [Cefaclor]   . Namenda [Memantine Hcl] Other (See Comments)    "dizzy", brand medication  . Penicillins     Has patient had a PCN reaction causing immediate rash, facial/tongue/throat swelling, SOB or lightheadedness with hypotension: Unknown Has patient had a PCN reaction causing severe rash involving mucus membranes or skin necrosis: Unknown Has patient had a PCN reaction that required hospitalization: Unknown Has patient had a PCN reaction occurring within the last 10 years: Unknown If all of the above answers are "NO", then may proceed with Cephalosporin use.   . Ramipril Other (See Comments)     cough    Antimicrobials this admission: 8/17 vancomycin >> 8/18 8/17 aztreonam >> 8/18 8/18 Unasyn>>  Microbiology results: 8/17 BCx2:  NG<24h 8/17 UCx: in progress 8/17 MRSA PCR: negatvie   Thank you for allowing pharmacy to be a part of this patient's care.  Despina Pole, Pharm. D. Clinical Pharmacist 05/28/2018 2:23 PM

## 2018-05-28 NOTE — ED Notes (Signed)
Bed marked ready   RN unable to take report at this time

## 2018-05-28 NOTE — ED Notes (Signed)
Report to Lauren, RN

## 2018-05-28 NOTE — ED Notes (Signed)
Pt has DNR from [facility]

## 2018-05-28 NOTE — ED Notes (Signed)
To Rad 

## 2018-05-29 DIAGNOSIS — R413 Other amnesia: Secondary | ICD-10-CM

## 2018-05-29 LAB — COMPREHENSIVE METABOLIC PANEL
ALT: 16 U/L (ref 0–44)
ANION GAP: 6 (ref 5–15)
AST: 25 U/L (ref 15–41)
Albumin: 3 g/dL — ABNORMAL LOW (ref 3.5–5.0)
Alkaline Phosphatase: 64 U/L (ref 38–126)
BILIRUBIN TOTAL: 0.7 mg/dL (ref 0.3–1.2)
BUN: 17 mg/dL (ref 8–23)
CO2: 24 mmol/L (ref 22–32)
Calcium: 8.3 mg/dL — ABNORMAL LOW (ref 8.9–10.3)
Chloride: 112 mmol/L — ABNORMAL HIGH (ref 98–111)
Creatinine, Ser: 0.85 mg/dL (ref 0.61–1.24)
GFR calc non Af Amer: >60 mL/min (ref >60)
Glucose, Bld: 105 mg/dL — ABNORMAL HIGH (ref 70–99)
POTASSIUM: 3.3 mmol/L — AB (ref 3.5–5.1)
Sodium: 142 mmol/L (ref 135–145)
TOTAL PROTEIN: 6.2 g/dL — AB (ref 6.5–8.1)

## 2018-05-29 LAB — CBC WITH DIFFERENTIAL/PLATELET
Basophils Absolute: 0 10*3/uL (ref 0.0–0.1)
Basophils Relative: 0 %
EOS ABS: 0.1 10*3/uL (ref 0.0–0.7)
Eosinophils Relative: 1 %
HCT: 39.6 % (ref 39.0–52.0)
Hemoglobin: 11.8 g/dL — ABNORMAL LOW (ref 13.0–17.0)
LYMPHS ABS: 1.8 10*3/uL (ref 0.7–4.0)
Lymphocytes Relative: 20 %
MCH: 28.1 pg (ref 26.0–34.0)
MCHC: 29.8 g/dL — AB (ref 30.0–36.0)
MCV: 94.3 fL (ref 78.0–100.0)
MONO ABS: 1 10*3/uL (ref 0.1–1.0)
MONOS PCT: 11 %
NEUTROS PCT: 68 %
Neutro Abs: 6.3 10*3/uL (ref 1.7–7.7)
Platelets: 188 10*3/uL (ref 150–400)
RBC: 4.2 MIL/uL — ABNORMAL LOW (ref 4.22–5.81)
RDW: 15.2 % (ref 11.5–15.5)
WBC: 9.3 10*3/uL (ref 4.0–10.5)

## 2018-05-29 LAB — URINE CULTURE: CULTURE: NO GROWTH

## 2018-05-29 MED ORDER — IPRATROPIUM-ALBUTEROL 0.5-2.5 (3) MG/3ML IN SOLN
3.0000 mL | Freq: Three times a day (TID) | RESPIRATORY_TRACT | Status: DC
  Administered 2018-05-30 – 2018-05-31 (×5): 3 mL via RESPIRATORY_TRACT
  Filled 2018-05-29 (×5): qty 3

## 2018-05-29 MED ORDER — VANCOMYCIN HCL IN DEXTROSE 750-5 MG/150ML-% IV SOLN
750.0000 mg | Freq: Two times a day (BID) | INTRAVENOUS | Status: DC
  Filled 2018-05-29 (×3): qty 150

## 2018-05-29 MED ORDER — SODIUM CHLORIDE 0.9 % IV SOLN
3.0000 g | Freq: Four times a day (QID) | INTRAVENOUS | Status: DC
  Administered 2018-05-29 – 2018-05-31 (×7): 3 g via INTRAVENOUS
  Filled 2018-05-29 (×9): qty 3

## 2018-05-29 NOTE — Evaluation (Signed)
Clinical/Bedside Swallow Evaluation Patient Details  Name: Zachary Warren MRN: [id number] Date of Birth: 1932/11/15  Today's Date: 05/29/2018 Time: SLP Start Time (ACUTE ONLY): 21 SLP Stop Time (ACUTE ONLY): 1130 SLP Time Calculation (min) (ACUTE ONLY): 25 min  Past Medical History:  Past Medical History:  Diagnosis Date  . COPD (chronic obstructive pulmonary disease) ([place])   . Coronary artery disease    s/p 5v CABG 2002  . CVA (cerebral infarction) 1999  . DDD (degenerative disc disease), cervical   . Dementia   . Depression   . GERD (gastroesophageal reflux disease)   . History of kidney stones   . Hyperlipidemia   . Hypertension   . Memory impairment   . OSA (obstructive sleep apnea)    Recently diagnosed though Zachary Warren. has not had CPAP trial  . Permanent atrial fibrillation (HCC)    chads2vasc score is 6  . Pneumonia    recent hospital admission for pneumonia   . Prostate cancer ([place]) 2006   s/p XRT  . PVD (peripheral vascular disease) ([place])   . Seizures ([place])   . Tachycardia-bradycardia [facility])    s/p PPM by [facility] 6/12   Past Surgical History:  Past Surgical History:  Procedure Laterality Date  . CARDIOVERSION N/A 08/21/2013   Procedure: CARDIOVERSION;  Surgeon: Candee Furbish, MD;  Location: [place];  Service: Cardiovascular;  Laterality: N/A;  . [place]  . CORONARY ARTERY BYPASS GRAFT  2002  . CYSTOSCOPY W/ URETERAL STENT PLACEMENT Left 05/13/2017   Procedure: CYSTOSCOPY WITH RETROGRADE PYELOGRAM/URETERAL STENT PLACEMENT LEFT;  Surgeon: Alexis Frock, MD;  [facility];  Service: Urology;  Laterality: Left;  . CYSTOSCOPY WITH RETROGRADE PYELOGRAM, URETEROSCOPY AND STENT PLACEMENT Left 06/09/2017   Procedure: CYSTOSCOPY WITH RETROGRADE PYELOGRAM, URETEROSCOPY;  Surgeon: Alexis Frock, MD;  [facility];  Service: Urology;  Laterality: Left;  . HOLMIUM LASER APPLICATION Left 5/62/1308   Procedure: HOLMIUM LASER APPLICATION;  Surgeon: Alexis Frock, MD;   [facility];  Service: Urology;  Laterality: Left;  . INSERT / REPLACE / REMOVE PACEMAKER    . NECK SURGERY     hx of   . PACEMAKER INSERTION     implanted by [facility] 6/12   HPI:  Zachary Warren is a 84 y.o. male nursing home resident with dementia was sent to the emergency department today with symptoms of fever up to 104, cough and chest congestion and some urinary incontinence.   He was noted to have a left lower lobe pneumonia and there is concern that he has been aspirating. BSE requested.   Assessment / Plan / Recommendation Clinical Impression  BSE completed with family present who provided some background information. Zachary Warren known to this SLP from previous outpatient dysphagia therapy several years ago following MBSS in 2015 with recommendation for thin with chin tuck or to consume NTL. Zachary Warren reportedly has been consuming a regular diet with thin liquids at his ALF for the past year. Zachary Warren presents with congested cough prior to po administration. Oral motor examination is WNL. No overt signs or symptoms of aspiration with self presentation of thin liquids, puree, and regular textures, however Zachary Warren with mild wet vocal quality. Given h/o dysphagia and aspiration per MBSS in 2015 and MD with strong suspicion of aspiration PNA at this time, will proceed with MBSS tomorrow with results and recommendations to follow. SLP reviewed importance of oral care and safe swallow precautions with Zachary Warren and spouse. Zachary Warren is dependent upon others for initiation of  oral care at ALF. SLP to follow.   SLP Visit Diagnosis: Dysphagia, unspecified (R13.10)    Aspiration Risk  Mild aspiration risk    Diet Recommendation Dysphagia 3 (Mech soft);Thin liquid   Liquid Administration via: Cup Medication Administration: Whole meds with liquid Supervision: Patient able to self feed;Full supervision/cueing for compensatory strategies Compensations: Slow rate;Minimize environmental distractions Postural Changes: Seated upright at 90  degrees;Remain upright for at least 30 minutes after po intake    Other  Recommendations Oral Care Recommendations: Oral care BID;Staff/trained caregiver to provide oral care Other Recommendations: Clarify dietary restrictions   Follow up Recommendations 24 hour supervision/assistance      Frequency and Duration min 2x/week  1 week       Prognosis Prognosis for Safe Diet Advancement: Fair Barriers to Reach Goals: Cognitive deficits      Swallow Study   General Date of Onset: 05/28/18 HPI: Zachary Warren is a 84 y.o. male nursing home resident with dementia was sent to the emergency department today with symptoms of fever up to 104, cough and chest congestion and some urinary incontinence.   He was noted to have a left lower lobe pneumonia and there is concern that he has been aspirating. BSE requested. Type of Study: Bedside Swallow Evaluation Previous Swallow Assessment: 8/18: BSE reg/thin; 07/2014 MBSS reg/thin with CT or NTL Diet Prior to this Study: Dysphagia 2 (chopped);Nectar-thick liquids(reg/thin at ALF) Temperature Spikes Noted: No Respiratory Status: Room air History of Recent Intubation: No Behavior/Cognition: Alert;Cooperative;Pleasant mood;Requires cueing Oral Cavity Assessment: Within Functional Limits Oral Care Completed by SLP: No Oral Cavity - Dentition: Adequate natural dentition Vision: Functional for self-feeding Self-Feeding Abilities: Able to feed self Patient Positioning: Upright in bed Baseline Vocal Quality: Normal Volitional Cough: Strong;Congested Volitional Swallow: Able to elicit    Oral/Motor/Sensory Function Overall Oral Motor/Sensory Function: Within functional limits   Ice Chips Ice chips: Within functional limits Presentation: Spoon   Thin Liquid Thin Liquid: Within functional limits Presentation: Cup;Self Fed;Straw    Nectar Thick Nectar Thick Liquid: Within functional limits Presentation: Self Fed;Straw   Honey Thick Honey Thick  Liquid: Not tested   Puree Puree: Within functional limits Presentation: Spoon   Solid     Solid: Within functional limits Presentation: Self Fed     Thank you,  Genene Churn, [place]  Aadon Gorelik 05/29/2018,11:47 AM

## 2018-05-29 NOTE — Progress Notes (Signed)
PROGRESS NOTE    Zachary Warren  AST:[id number]  DOB: [birth year]  DOA: 05/28/2018 PCP: Wenda Low, MD  Brief Admission Hx: Zachary Warren is a 84 y.o. male nursing home resident with dementia was sent to the emergency department today with symptoms of fever up to 104, cough and chest congestion and some urinary incontinence.   He was noted to have a left lower lobe pneumonia and there is concern that he has been aspirating.  MDM/Assessment & Plan:   1. Left lower lobe pneumonia-suspect he likely has aspirated given his advanced dementia.  Aspiration precautions ordered.  Elevate head of the bed.  Dysphagia diet.  Awaiting SLP for full dysphagia evaluation.  I spoke with pharmacist: Pt has had zosyn, augmentin, cephalosporins in the past, will start him on Unasyn to cover aspiration pneumonia.  Continue supportive care with oxygen ordered continue gentle IV fluid hydration. 2. Chronic atrial fibrillation- he is anticoagulated with Eliquis.  His rate is controlled at this time.  He does have a history of tachybradycardia syndrome.  We will continue to monitor. 3. OSA- he is not currently on CPAP. 4. Dementia- fairly stable he is pleasant will monitor closely for signs of acute delirium.  He is at high risk.  Wife remains at bedside for support.  5. COPD-duo nebs ordered every 6 hours. 6. Hypokalemia- oral replacement ordered.  Follow magnesium and replenish as needed. 7. CAD-stable. 8. Epilepsy-resumed home medications. 9. GERD-Protonix ordered for GI protection. 10. Dysphagia-as noted above late effect of prior stroke, suspect he has aspirated and will ask for SLP to evaluate.  For now, dysphagia 2 diet is been ordered.  Nectar thickened liquids.  SLP evaluating patient today. Will defer to recommendations.  11. DNR -patient has DNR paperwork from his facility.  Will continue DNR while in hospital.  DVT Prophylaxis: Apixaban Code Status: DNR Family Communication: wife at bedside  updated Disposition Plan: Return to SNF when medically stable  Consultants:  SLP   Antimicrobials:  Aztreonam/vancomycin 8/17-8/18  Unasyn 8/18 -   Subjective: Pt pleasantly demented, no complaints.   Objective: Vitals:   05/28/18 1939 05/28/18 2128 05/29/18 0630 05/29/18 0836  BP:  126/63 123/61   Pulse: 80 94 94   Resp: 16 18 18    Temp:  98.5 F (36.9 C) 98.6 F (37 C)   TempSrc:  Oral Oral   SpO2: 92% 92% 94% 91%  Weight:      Height:        Intake/Output Summary (Last 24 hours) at 05/29/2018 1110 Last data filed at 05/29/2018 9892 Gross per 24 hour  Intake 1865.83 ml  Output 375 ml  Net 1490.83 ml   Filed Weights   05/28/18 1008  Weight: 91.5 kg   REVIEW OF SYSTEMS  As per history otherwise all reviewed and reported negative  Exam:  General exam: elderly demented male, NAD, cooperative and pleasant.  Respiratory system: diffuse rales and rhonchi LLL. No increased work of breathing. Cardiovascular system: S1 & S2 heard, irregular. No JVD. Gastrointestinal system: Abdomen is nondistended, soft and nontender. Normal bowel sounds heard. Central nervous system: Alert and demented. Moving extremities spontaneously.  Extremities: no CCE.  Data Reviewed: Basic Metabolic Panel: Recent Labs  Lab 05/28/18 1108 05/29/18 0559  NA 144 142  K 3.3* 3.3*  CL 108 112*  CO2 28 24  GLUCOSE 113* 105*  BUN 21 17  CREATININE 1.09 0.85  CALCIUM 8.8* 8.3*   Liver Function Tests: Recent Labs  Lab 05/28/18  1108 05/29/18 0559  AST 24 25  ALT 18 16  ALKPHOS 72 64  BILITOT 0.6 0.7  PROT 6.3* 6.2*  ALBUMIN 3.4* 3.0*   No results for input(s): LIPASE, AMYLASE in the last 168 hours. No results for input(s): AMMONIA in the last 168 hours. CBC: Recent Labs  Lab 05/28/18 1108 05/29/18 0559  WBC 17.9* 9.3  NEUTROABS 15.4* 6.3  HGB 12.4* 11.8*  HCT 39.9 39.6  MCV 93.7 94.3  PLT 194 188   Cardiac Enzymes: Recent Labs  Lab 05/28/18 1108  TROPONINI 0.03*     CBG (last 3)  No results for input(s): GLUCAP in the last 72 hours. Recent Results (from the past 240 hour(s))  MRSA PCR Screening     Status: None   Collection Time: 05/28/18 10:05 AM  Result Value Ref Range Status   MRSA by PCR NEGATIVE NEGATIVE Final    Comment:        The GeneXpert MRSA Assay (FDA approved for NASAL specimens only), is one component of a comprehensive MRSA colonization surveillance program. It is not intended to diagnose MRSA infection nor to guide or monitor treatment for MRSA infections. Performed at [facility], [address] [place], [place] [address]   Culture, blood (routine x 2)     Status: None (Preliminary result)   Collection Time: 05/28/18 11:08 AM  Result Value Ref Range Status   Specimen Description   Final    RIGHT ANTECUBITAL BOTTLES DRAWN AEROBIC AND ANAEROBIC   Special Requests   Final    Blood Culture results may not be optimal due to an excessive volume of blood received in culture bottles   Culture   Final    NO GROWTH < 24 HOURS Performed at [facility], [address] [place], [place] [address]    Report Status PENDING  Incomplete  Culture, blood (routine x 2)     Status: None (Preliminary result)   Collection Time: 05/28/18 11:09 AM  Result Value Ref Range Status   Specimen Description   Final    LEFT ANTECUBITAL BOTTLES DRAWN AEROBIC AND ANAEROBIC   Special Requests Blood Culture adequate volume  Final   Culture   Final    NO GROWTH < 24 HOURS Performed at [facility], [address], [place], [place] [address]    Report Status PENDING  Incomplete  Group A Strep by PCR     Status: None   Collection Time: 05/28/18 11:31 AM  Result Value Ref Range Status   Group A Strep by PCR NOT DETECTED NOT DETECTED Final    Comment: Performed at [facility], [address], [place], [place] [address]     Studies: Dg Chest 2 View  Result Date: 05/28/2018 CLINICAL DATA:  Fever and cough. EXAM: CHEST - 2 VIEW COMPARISON:  Chest  x-ray dated August 10, 2017. FINDINGS: Unchanged left chest wall pacemaker. Stable cardiomegaly status post CABG. Normal pulmonary vascularity. Atherosclerotic calcification of the aortic arch. Increased patchy density in the left lower lobe. Small left pleural effusion. The right lung is clear. No pneumothorax. No acute osseous abnormality. IMPRESSION: 1. Patchy airspace disease in the left lower lobe, suspicious for pneumonia. 2. Small left pleural effusion. Electronically Signed   By: Titus Dubin M.D.   On: 05/28/2018 12:32   Scheduled Meds: . apixaban  5 mg Oral BID  . atorvastatin  40 mg Oral q1800  . buPROPion  75 mg Oral BID  . diltiazem  180 mg Oral BID  .  FLUoxetine  40 mg Oral Daily  . ipratropium-albuterol  3 mL Nebulization Q6H  . mometasone-formoterol  2 puff Inhalation BID  . pantoprazole  40 mg Oral Daily  . potassium chloride SA  20 mEq Oral BID  . topiramate  50 mg Oral Daily   Continuous Infusions: . sodium chloride 65 mL/hr at 05/28/18 2221  . aztreonam Stopped (05/29/18 0631)  . vancomycin      Principal Problem:   LLL pneumonia (HCC) Active Problems:   Fever   Pneumonia   Essential hypertension   Dysphagia as late effect of stroke   GERD (gastroesophageal reflux disease)   MCI (mild cognitive impairment) with memory loss   Hypokalemia   Tachycardia-bradycardia (HCC)   Seizures (HCC)   PVD (peripheral vascular disease) (HCC)   Persistent atrial fibrillation (HCC)   Pacemaker   OSA (obstructive sleep apnea)   Memory impairment   Coronary artery disease   COPD (chronic obstructive pulmonary disease) ([place])  Time spent:   Irwin Brakeman, MD, FAAFP [facility] Pager [phone]  If 7PM-7AM, please contact night-coverage [email or web address] Password [email or web address] 05/29/2018, 11:10 AM    LOS: 1 day

## 2018-05-30 ENCOUNTER — Inpatient Hospital Stay (HOSPITAL_COMMUNITY): Payer: Medicare Other

## 2018-05-30 DIAGNOSIS — I10 Essential (primary) hypertension: Secondary | ICD-10-CM

## 2018-05-30 DIAGNOSIS — J690 Pneumonitis due to inhalation of food and vomit: Secondary | ICD-10-CM

## 2018-05-30 DIAGNOSIS — I481 Persistent atrial fibrillation: Secondary | ICD-10-CM

## 2018-05-30 DIAGNOSIS — G4733 Obstructive sleep apnea (adult) (pediatric): Secondary | ICD-10-CM

## 2018-05-30 DIAGNOSIS — I69391 Dysphagia following cerebral infarction: Secondary | ICD-10-CM

## 2018-05-30 DIAGNOSIS — R569 Unspecified convulsions: Secondary | ICD-10-CM

## 2018-05-30 DIAGNOSIS — J449 Chronic obstructive pulmonary disease, unspecified: Secondary | ICD-10-CM

## 2018-05-30 LAB — BASIC METABOLIC PANEL
Anion gap: 9 (ref 5–15)
BUN: 16 mg/dL (ref 8–23)
CALCIUM: 8.5 mg/dL — AB (ref 8.9–10.3)
CHLORIDE: 110 mmol/L (ref 98–111)
CO2: 26 mmol/L (ref 22–32)
CREATININE: 0.85 mg/dL (ref 0.61–1.24)
Glucose, Bld: 102 mg/dL — ABNORMAL HIGH (ref 70–99)
Potassium: 3.7 mmol/L (ref 3.5–5.1)
SODIUM: 145 mmol/L (ref 135–145)

## 2018-05-30 LAB — MAGNESIUM: MAGNESIUM: 2.1 mg/dL (ref 1.7–2.4)

## 2018-05-30 NOTE — Clinical Social Work Note (Signed)
Clinical Social Work Assessment  Patient Details  Name: Zachary Warren MRN: [id number] Date of Birth: 1933/04/05  Date of referral:  05/30/18               Reason for consult:  Facility Placement                Permission sought to share information with:    Permission granted to share information::     Name::        Agency::     Relationship::     Contact Information:  Spouse, Zachary Warren, listed on chart  Housing/Transportation Living arrangements for the past 2 months:  [place] of Information:  Patient, Spouse Patient Interpreter Needed:  None Criminal Activity/Legal Involvement Pertinent to Current Situation/Hospitalization:  No - Comment as needed Significant Relationships:  Spouse Lives with:  Facility Resident Do you feel safe going back to the place where you live?  Yes Need for family participation in patient care:  Yes (Comment)  Care giving concerns:  None identified by spouse.    Social Worker assessment / plan:  Patient has been in the memory care unit at [facility] since 09/01/17.  Patient ambulates independently, feeds himself, toilets himself and receives minimal assistance with bathing/grooming. [name] plans for patient to return to the facility at discharge.  LCSW to follow up with facility.   Employment status:  Retired Forensic scientist:  Medicare PT Recommendations:  Not assessed at this time Information / Referral to community resources:     Patient/Family's Response to care:  Family is agreeable for patient to return to the facility at discharge.   Patient/Family's Understanding of and Emotional Response to Diagnosis, Current Treatment, and Prognosis:  Family understands patient's diagnosis, treatment and prognosis and feel patient is most appropriately managed in his current ALF placement.   Emotional Assessment Appearance:  Appears stated age Attitude/Demeanor/Rapport:    Affect (typically observed):   Calm Orientation:  Oriented to Self Alcohol / Substance use:  Not Applicable Psych involvement (Current and /or in the community):  No (Comment)  Discharge Needs  Concerns to be addressed:  Other (Comment Required(Return to [place]) Readmission within the last 30 days:  No Current discharge risk:  None Barriers to Discharge:  No Barriers Identified   Ihor Gully, LCSW 05/30/2018, 2:18 PM

## 2018-05-30 NOTE — Progress Notes (Signed)
Modified Barium Swallow Progress Note  Patient Details  Name: Zachary Warren MRN: [id number] Date of Birth: 06-24-1933  Today's Date: 05/30/2018  Modified Barium Swallow completed.  Full report located under Chart Review in the Imaging Section.  Brief recommendations include the following:  Clinical Impression  Oropharyngeal swallow essentially WNL for age with min delay in swallow initiation with swallow trigger at the level of the valleculae and occasional spill to pyriforms with larger sips thin. Regular textures were retained in the valleculae for ~7 seconds before swallow trigger. Pt with one single episode of flash penetration (underepiglottic coating) with straw sips thin and no aspiration or retention observed. Recommend regular textures and thin liquids. Pt would benefit from supervision with meals due to dementia. No further SLP services indicated at this time. Above to RN.    Swallow Evaluation Recommendations       SLP Diet Recommendations: Regular solids;Thin liquid   Liquid Administration via: Cup;Straw   Medication Administration: Whole meds with liquid   Supervision: Patient able to self feed;Intermittent supervision to cue for compensatory strategies   Compensations: Slow rate;Minimize environmental distractions   Postural Changes: Remain semi-upright after after feeds/meals (Comment);Seated upright at 90 degrees   Oral Care Recommendations: Oral care BID;Staff/trained caregiver to provide oral care   Other Recommendations: Clarify dietary restrictions   Thank you,  Genene Churn, [place] 05/30/2018,12:27 PM

## 2018-05-30 NOTE — Progress Notes (Signed)
PROGRESS NOTE    Zachary Warren  FHL:[id number]  DOB: [birth year]  DOA: 05/28/2018 PCP: Wenda Low, MD  Brief Admission Hx: Zachary Warren is a 84 y.o. male nursing home resident with dementia was sent to the emergency department today with symptoms of fever up to 104, cough and chest congestion and some urinary incontinence.   He was noted to have a left lower lobe pneumonia and there is concern that he has been aspirating.  MDM/Assessment & Plan:   1. Left lower lobe pneumonia-suspect he likely has aspirated given his advanced dementia.  Aspiration precautions ordered.    Currently on Unasyn.  Fevers appear to be resolved.  Monitor another 24 hours and if remains afebrile, will transition to Augmentin tomorrow with likely discharge 2. Chronic atrial fibrillation- he is anticoagulated with Eliquis.  His rate is controlled at this time.  He does have a history of tachybradycardia syndrome.  We will continue to monitor. 3. OSA- he is not currently on CPAP. 4. Dementia- fairly stable he is pleasant will monitor closely for signs of acute delirium.  He is at high risk.  Wife remains at bedside for support.  5. COPD-duo nebs ordered every 6 hours. 6. Hypokalemia- oral replacement ordered.  Follow magnesium and replenish as needed. 7. CAD-stable. 8. Epilepsy-resumed home medications. 9. GERD-Protonix ordered for GI protection. 10. Dysphagia-as noted above late effect of prior stroke, suspect he has aspirated and will ask for SLP to evaluate.  Seen by speech therapy with recommendations for regular diet with thin liquids  DVT Prophylaxis: Apixaban Code Status: DNR Family Communication: wife at bedside updated Disposition Plan: Return to SNF when medically stable, likely in a.m. if no further fevers  Consultants:  SLP   Antimicrobials:  Aztreonam/vancomycin 8/17-8/18  Unasyn 8/18 -   Subjective: Denies any shortness of breath.  Feels her cough is getting  better.  Objective: Vitals:   05/30/18 0743 05/30/18 1359 05/30/18 1405 05/30/18 1939  BP:  (!) 117/58    Pulse:  94  90  Resp:  18  18  Temp:  99 F (37.2 C)    TempSrc:  Oral    SpO2: 91% 95% 92% 95%  Weight:      Height:        Intake/Output Summary (Last 24 hours) at 05/30/2018 2006 Last data filed at 05/30/2018 1700 Gross per 24 hour  Intake 885 ml  Output 550 ml  Net 335 ml   Filed Weights   05/28/18 1008  Weight: 91.5 kg   REVIEW OF SYSTEMS  As per history otherwise all reviewed and reported negative  Exam:  General exam: Alert, awake, no distress Respiratory system: Rhonchi left lower lobe respiratory effort normal. Cardiovascular system:RRR. No murmurs, rubs, gallops. Gastrointestinal system: Abdomen is nondistended, soft and nontender. No organomegaly or masses felt. Normal bowel sounds heard. Central nervous system: No focal neurological deficits. Extremities: No C/C/E, +pedal pulses Skin: No rashes, lesions or ulcers Psychiatry: Pleasant, cooperative with exam  Data Reviewed: Basic Metabolic Panel: Recent Labs  Lab 05/28/18 1108 05/29/18 0559 05/30/18 0437  NA 144 142 145  K 3.3* 3.3* 3.7  CL 108 112* 110  CO2 28 24 26   GLUCOSE 113* 105* 102*  BUN 21 17 16   CREATININE 1.09 0.85 0.85  CALCIUM 8.8* 8.3* 8.5*  MG  --   --  2.1   Liver Function Tests: Recent Labs  Lab 05/28/18 1108 05/29/18 0559  AST 24 25  ALT 18 16  ALKPHOS  72 64  BILITOT 0.6 0.7  PROT 6.3* 6.2*  ALBUMIN 3.4* 3.0*   No results for input(s): LIPASE, AMYLASE in the last 168 hours. No results for input(s): AMMONIA in the last 168 hours. CBC: Recent Labs  Lab 05/28/18 1108 05/29/18 0559  WBC 17.9* 9.3  NEUTROABS 15.4* 6.3  HGB 12.4* 11.8*  HCT 39.9 39.6  MCV 93.7 94.3  PLT 194 188   Cardiac Enzymes: Recent Labs  Lab 05/28/18 1108  TROPONINI 0.03*   CBG (last 3)  No results for input(s): GLUCAP in the last 72 hours. Recent Results (from the past 240  hour(s))  MRSA PCR Screening     Status: None   Collection Time: 05/28/18 10:05 AM  Result Value Ref Range Status   MRSA by PCR NEGATIVE NEGATIVE Final    Comment:        The GeneXpert MRSA Assay (FDA approved for NASAL specimens only), is one component of a comprehensive MRSA colonization surveillance program. It is not intended to diagnose MRSA infection nor to guide or monitor treatment for MRSA infections. Performed at [facility], [address] [place], [place] [address]   Culture, blood (routine x 2)     Status: None (Preliminary result)   Collection Time: 05/28/18 11:08 AM  Result Value Ref Range Status   Specimen Description   Final    RIGHT ANTECUBITAL BOTTLES DRAWN AEROBIC AND ANAEROBIC   Special Requests   Final    Blood Culture results may not be optimal due to an excessive volume of blood received in culture bottles   Culture   Final    NO GROWTH 2 DAYS Performed at [facility], [address] [place], [place] [address]    Report Status PENDING  Incomplete  Culture, blood (routine x 2)     Status: None (Preliminary result)   Collection Time: 05/28/18 11:09 AM  Result Value Ref Range Status   Specimen Description   Final    LEFT ANTECUBITAL BOTTLES DRAWN AEROBIC AND ANAEROBIC   Special Requests Blood Culture adequate volume  Final   Culture   Final    NO GROWTH 2 DAYS Performed at [facility], [address] [place], [place] [address]    Report Status PENDING  Incomplete  Urine culture     Status: None   Collection Time: 05/28/18 11:25 AM  Result Value Ref Range Status   Specimen Description   Final    URINE, CATHETERIZED Performed at [facility], [address] [place], [place] [address]    Special Requests   Final    NONE Performed at [facility], [address], [place], [place] [address]    Culture   Final    NO GROWTH Performed at [facility], [place] [address] [place], [place] [address]    Report Status 05/29/2018 FINAL  Final  Group  A Strep by PCR     Status: None   Collection Time: 05/28/18 11:31 AM  Result Value Ref Range Status   Group A Strep by PCR NOT DETECTED NOT DETECTED Final    Comment: Performed at [facility], [address], [place], [place] [address]     Studies: Dg Swallowing Func-speech Pathology  Result Date: 05/30/2018 Objective Swallowing Evaluation: Type of Study: MBS-Modified Barium Swallow Study  Patient Details Name: Zachary Warren MRN: [id number] Date of Birth: 20-May-1933 Today's Date: 05/30/2018 Time: SLP Start Time (ACUTE ONLY): 1130 -SLP Stop Time (ACUTE ONLY): 1203 SLP Time Calculation (min) (ACUTE  ONLY): 33 min Past Medical History: Past Medical History: Diagnosis Date . COPD (chronic obstructive pulmonary disease) ([place])  . Coronary artery disease   s/p 5v CABG 2002 . CVA (cerebral infarction) 1999 . DDD (degenerative disc disease), cervical  . Dementia  . Depression  . GERD (gastroesophageal reflux disease)  . History of kidney stones  . Hyperlipidemia  . Hypertension  . Memory impairment  . OSA (obstructive sleep apnea)   Recently diagnosed though pt. has not had CPAP trial . Permanent atrial fibrillation (HCC)   chads2vasc score is 6 . Pneumonia   recent hospital admission for pneumonia  . Prostate cancer ([place]) 2006  s/p XRT . PVD (peripheral vascular disease) ([place])  . Seizures ([place])  . Tachycardia-bradycardia [facility])   s/p PPM by [facility] 6/12 Past Surgical History: Past Surgical History: Procedure Laterality Date . CARDIOVERSION N/A 08/21/2013  Procedure: CARDIOVERSION;  Surgeon: Candee Furbish, MD;  Location: [facility];  Service: Cardiovascular;  Laterality: N/A; . [place] . CORONARY ARTERY BYPASS GRAFT  2002 . CYSTOSCOPY W/ URETERAL STENT PLACEMENT Left 05/13/2017  Procedure: CYSTOSCOPY WITH RETROGRADE PYELOGRAM/URETERAL STENT PLACEMENT LEFT;  Surgeon: Alexis Frock, MD;  Location: [facility];  Service: Urology;  Laterality: Left; . CYSTOSCOPY WITH RETROGRADE PYELOGRAM, URETEROSCOPY AND STENT PLACEMENT Left  06/09/2017  Procedure: CYSTOSCOPY WITH RETROGRADE PYELOGRAM, URETEROSCOPY;  Surgeon: Alexis Frock, MD;  Location: [facility];  Service: Urology;  Laterality: Left; . HOLMIUM LASER APPLICATION Left 02/05/8340  Procedure: HOLMIUM LASER APPLICATION;  Surgeon: Alexis Frock, MD;  Location: [facility];  Service: Urology;  Laterality: Left; . INSERT / REPLACE / REMOVE PACEMAKER   . NECK SURGERY    hx of  . PACEMAKER INSERTION    implanted by [facility] 6/12 HPI: Zachary Warren is a 84 y.o. male nursing home resident with dementia was sent to the emergency department today with symptoms of fever up to 104, cough and chest congestion and some urinary incontinence.   He was noted to have a left lower lobe pneumonia and there is concern that he has been aspirating. BSE requested.  Subjective: "Fine." Assessment / Plan / Recommendation CHL IP CLINICAL IMPRESSIONS 05/30/2018 Clinical Impression Oropharyngeal swallow essentially WNL for age with min delay in swallow initiation with swallow trigger at the level of the valleculae and occasional spill to pyriforms with larger sips thin. Regular textures were retained in the valleculae for ~7 seconds before swallow trigger. Pt with one single episode of flash penetration (underepiglottic coating) with straw sips thin and no aspiration or retention observed. Recommend regular textures and thin liquids. Pt would benefit from supervision with meals due to dementia. No further SLP services indicated at this time. Above to RN.  SLP Visit Diagnosis Dysphagia, oropharyngeal phase (R13.12) Attention and concentration deficit following -- Frontal lobe and executive function deficit following -- Impact on safety and function Mild aspiration risk   CHL IP TREATMENT RECOMMENDATION 05/30/2018 Treatment Recommendations No treatment recommended at this time   Prognosis 05/30/2018 Prognosis for Safe Diet Advancement Good Barriers to Reach Goals Cognitive deficits Barriers/Prognosis Comment -- CHL IP DIET  RECOMMENDATION 05/30/2018 SLP Diet Recommendations Regular solids;Thin liquid Liquid Administration via Cup;Straw Medication Administration Whole meds with liquid Compensations Slow rate;Minimize environmental distractions Postural Changes Remain semi-upright after after feeds/meals (Comment);Seated upright at 90 degrees   CHL IP OTHER RECOMMENDATIONS 05/30/2018 Recommended Consults -- Oral Care Recommendations Oral care BID;Staff/trained caregiver to provide oral care Other Recommendations Clarify dietary restrictions   CHL IP FOLLOW UP RECOMMENDATIONS 05/30/2018 Follow up  Recommendations 24 hour supervision/assistance   CHL IP FREQUENCY AND DURATION 05/29/2018 Speech Therapy Frequency (ACUTE ONLY) min 2x/week Treatment Duration 1 week      CHL IP ORAL PHASE 05/30/2018 Oral Phase WFL Oral - Pudding Teaspoon -- Oral - Pudding Cup -- Oral - Honey Teaspoon -- Oral - Honey Cup -- Oral - Nectar Teaspoon -- Oral - Nectar Cup -- Oral - Nectar Straw -- Oral - Thin Teaspoon -- Oral - Thin Cup -- Oral - Thin Straw -- Oral - Puree -- Oral - Mech Soft -- Oral - Regular -- Oral - Multi-Consistency -- Oral - Pill -- Oral Phase - Comment --  CHL IP PHARYNGEAL PHASE 05/30/2018 Pharyngeal Phase Impaired Pharyngeal- Pudding Teaspoon -- Pharyngeal -- Pharyngeal- Pudding Cup -- Pharyngeal -- Pharyngeal- Honey Teaspoon -- Pharyngeal -- Pharyngeal- Honey Cup -- Pharyngeal -- Pharyngeal- Nectar Teaspoon -- Pharyngeal -- Pharyngeal- Nectar Cup -- Pharyngeal -- Pharyngeal- Nectar Straw -- Pharyngeal -- Pharyngeal- Thin Teaspoon Delayed swallow initiation-vallecula Pharyngeal -- Pharyngeal- Thin Cup Delayed swallow initiation-vallecula;Delayed swallow initiation-pyriform sinuses Pharyngeal -- Pharyngeal- Thin Straw Delayed swallow initiation-pyriform sinuses;Penetration/Aspiration during swallow Pharyngeal Material does not enter airway;Material enters airway, remains ABOVE vocal cords then ejected out Pharyngeal- Puree WFL;Delayed swallow  initiation-vallecula Pharyngeal -- Pharyngeal- Mechanical Soft -- Pharyngeal -- Pharyngeal- Regular Delayed swallow initiation-vallecula Pharyngeal -- Pharyngeal- Multi-consistency -- Pharyngeal -- Pharyngeal- Pill WFL Pharyngeal -- Pharyngeal Comment --  CHL IP CERVICAL ESOPHAGEAL PHASE 05/30/2018 Cervical Esophageal Phase WFL Pudding Teaspoon -- Pudding Cup -- Honey Teaspoon -- Honey Cup -- Nectar Teaspoon -- Nectar Cup -- Nectar Straw -- Thin Teaspoon -- Thin Cup -- Thin Straw -- Puree -- Mechanical Soft -- Regular -- Multi-consistency -- Pill -- Cervical Esophageal Comment -- Thank you, Genene Churn, CCC-SLP [phone] PORTER,DABNEY 05/30/2018, 12:29 PM              Scheduled Meds: . apixaban  5 mg Oral BID  . atorvastatin  40 mg Oral q1800  . buPROPion  75 mg Oral BID  . diltiazem  180 mg Oral BID  . FLUoxetine  40 mg Oral Daily  . ipratropium-albuterol  3 mL Nebulization TID  . mometasone-formoterol  2 puff Inhalation BID  . pantoprazole  40 mg Oral Daily  . potassium chloride SA  20 mEq Oral BID  . topiramate  50 mg Oral Daily   Continuous Infusions: . ampicillin-sulbactam (UNASYN) IV Stopped (05/30/18 1535)    Principal Problem:   LLL pneumonia ([place]) Active Problems:   Essential hypertension   Dysphagia as late effect of stroke   GERD (gastroesophageal reflux disease)   MCI (mild cognitive impairment) with memory loss   Hypokalemia   Fever   Tachycardia-bradycardia (HCC)   Seizures (HCC)   PVD (peripheral vascular disease) (HCC)   Pneumonia   Persistent atrial fibrillation (HCC)   Pacemaker   OSA (obstructive sleep apnea)   Memory impairment   Coronary artery disease   COPD (chronic obstructive pulmonary disease) ([place])  Time spent: 75mins  Kathie Dike, MD [facility] Pager [phone]  If 7PM-7AM, please contact night-coverage [email or web address] Password [email or web address] 05/30/2018, 8:06 PM    LOS: 2 days

## 2018-05-31 DIAGNOSIS — E876 Hypokalemia: Secondary | ICD-10-CM

## 2018-05-31 MED ORDER — FUROSEMIDE 20 MG PO TABS
40.0000 mg | ORAL_TABLET | Freq: Every day | ORAL | 0 refills | Status: AC
Start: 2018-05-31 — End: ?

## 2018-05-31 MED ORDER — IPRATROPIUM-ALBUTEROL 0.5-2.5 (3) MG/3ML IN SOLN: 3.0000 mL | Freq: Three times a day (TID) | RESPIRATORY_TRACT | Status: AC

## 2018-05-31 MED ORDER — AMOXICILLIN-POT CLAVULANATE 875-125 MG PO TABS: 1.0000 | ORAL_TABLET | Freq: Two times a day (BID) | ORAL | Status: AC

## 2018-05-31 MED ORDER — AMOXICILLIN-POT CLAVULANATE 875-125 MG PO TABS
1.0000 | ORAL_TABLET | Freq: Two times a day (BID) | ORAL | Status: DC
  Administered 2018-05-31: 1 via ORAL
  Filled 2018-05-31: qty 1

## 2018-05-31 NOTE — Care Management Important Message (Signed)
Important Message  Patient Details  Name: CUTBERTO WINFREE MRN: [id number] Date of Birth: 24-Oct-1932   Medicare Important Message Given:  Yes    Shelda Altes 05/31/2018, 9:57 AM

## 2018-05-31 NOTE — Progress Notes (Signed)
EMS was called to check on the status of transporting patient to [facility]. They stated that the patient is on the list to be transported today.

## 2018-05-31 NOTE — NC FL2 (Signed)
[place] LEVEL OF CARE SCREENING TOOL     IDENTIFICATION  Patient Name: Zachary Warren Birthdate: 1933-02-11 Sex: male Admission Date (Current Location): 05/28/2018  [facility] and [place] Number:  [employer] and Address:  [place] [address] [place]      Provider Number: [id number]  Attending Physician Name and Address:  Kathie Dike, MD  Relative Name and Phone Number:       Current Level of Care: Hospital Recommended Level of Care: [place] Prior Approval Number:    Date Approved/Denied:   PASRR Number:    Discharge Plan: Other (Comment)(ALF)    Current Diagnoses: Patient Active Problem List   Diagnosis Date Noted  . Tachycardia-bradycardia ([place])   . Seizures ([place])   . PVD (peripheral vascular disease) ([place])   . Pneumonia   . Persistent atrial fibrillation ([place])   . Pacemaker   . OSA (obstructive sleep apnea)   . Memory impairment   . Hypertension   . History of kidney stones   . Depression   . Dementia   . DDD (degenerative disc disease), cervical   . Coronary artery disease   . COPD (chronic obstructive pulmonary disease) ([place])   . LLL pneumonia ([place]) 07/11/2017  . HCAP (healthcare-associated pneumonia) 07/11/2017  . Fever 07/10/2017  . Hypokalemia 10/25/2014  . MCI (mild cognitive impairment) with memory loss 07/24/2014  . Cardiac pacemaker in situ 12/25/2013  . Aneurysm ([place]) 06/28/2012  . CVA (cerebral infarction) 02/05/2012  . Dysphagia as late effect of stroke 02/05/2012  . GERD (gastroesophageal reflux disease) 02/05/2012  . 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 Hyperlipidemia 11/21/2010  . Essential hypertension 11/21/2010  . CAD, NATIVE VESSEL 11/21/2010  . Prostate cancer ([place]) 10/12/2004    Orientation RESPIRATION BLADDER Height & Weight     Self  Normal Incontinent Weight: 201 lb 11.5 oz (91.5 kg) Height:  6' (182.9 cm)  BEHAVIORAL SYMPTOMS/MOOD  NEUROLOGICAL BOWEL NUTRITION STATUS      Continent (heart healthy)  AMBULATORY STATUS COMMUNICATION OF NEEDS Skin   Independent Verbally Normal                       Personal Care Assistance Level of Assistance  Bathing, Feeding, Dressing Bathing Assistance: Limited assistance Feeding assistance: Independent Dressing Assistance: Limited assistance     Functional Limitations Info  Sight, Hearing, Speech Sight Info: Adequate Hearing Info: Adequate Speech Info: Adequate    SPECIAL CARE FACTORS FREQUENCY                       Contractures Contractures Info: Not present    Additional Factors Info  Code Status, Allergies Code Status Info: DNR Allergies Info: Aricept, Ceclor, Namenda, Penicillins, Ramipril           Current Medications (05/31/2018):  This is the current hospital active medication list Current Facility-Administered Medications  Medication Dose Route Frequency Provider Last Rate Last Dose  . amoxicillin-clavulanate (AUGMENTIN) 875-125 MG per tablet 1 tablet  1 tablet Oral Q12H Kathie Dike, MD   1 tablet at 05/31/18 0951  . apixaban (ELIQUIS) tablet 5 mg  5 mg Oral BID Wynetta Emery, Clanford L, MD   5 mg at 05/31/18 0950  . atorvastatin (LIPITOR) tablet 40 mg  40 mg Oral q1800 Johnson, Clanford L, MD   40 mg at 05/30/18 1744  . buPROPion [facility]) tablet 75 mg  75 mg Oral BID Johnson, Clanford L,  MD   75 mg at 05/31/18 0950  . diltiazem (CARDIZEM CD) 24 hr capsule 180 mg  180 mg Oral BID Johnson, Clanford L, MD   180 mg at 05/31/18 0951  . FLUoxetine (PROZAC) capsule 40 mg  40 mg Oral Daily Johnson, Clanford L, MD   40 mg at 05/31/18 0950  . ipratropium-albuterol (DUONEB) 0.5-2.5 (3) MG/3ML nebulizer solution 3 mL  3 mL Nebulization TID Wynetta Emery, Clanford L, MD   3 mL at 05/31/18 0724  . mometasone-formoterol (DULERA) 100-5 MCG/ACT inhaler 2 puff  2 puff Inhalation BID Wynetta Emery, Clanford L, MD   2 puff at 05/31/18 0730  . pantoprazole (PROTONIX) EC  tablet 40 mg  40 mg Oral Daily Johnson, Clanford L, MD   40 mg at 05/31/18 0950  . potassium chloride SA (K-DUR,KLOR-CON) CR tablet 20 mEq  20 mEq Oral BID Wynetta Emery, Clanford L, MD   20 mEq at 05/31/18 0951  . risperiDONE (RISPERDAL) tablet 0.25 mg  0.25 mg Oral BID PRN Johnson, Clanford L, MD   0.25 mg at 05/29/18 1012  . topiramate (TOPAMAX) tablet 50 mg  50 mg Oral Daily Johnson, Clanford L, MD   50 mg at 05/31/18 0950  . traZODone (DESYREL) tablet 50 mg  50 mg Oral QHS PRN Wynetta Emery, Clanford L, MD   50 mg at 05/29/18 2117     Discharge Medications: Medication List    TAKE these medications   acetaminophen 325 MG tablet Commonly known as:  TYLENOL Take 500 mg by mouth every 6 (six) hours as needed.   amoxicillin-clavulanate 875-125 MG tablet Commonly known as:  AUGMENTIN Take 1 tablet by mouth 2 (two) times daily for 3 days.   apixaban 5 MG Tabs tablet Commonly known as:  ELIQUIS Take 1 tablet (5 mg total) by mouth 2 (two) times daily.   atorvastatin 40 MG tablet Commonly known as:  LIPITOR TAKE 1 TABLET BY MOUTH EVERY DAY - PATIENT DUE FOR LIPID PANEL   budesonide-formoterol 80-4.5 MCG/ACT inhaler Commonly known as:  SYMBICORT Inhale 2 puffs into the lungs daily.   buPROPion 75 MG tablet Commonly known as:  WELLBUTRIN Take 75 mg by mouth 2 (two) times daily.   calcium carbonate 500 MG chewable tablet Commonly known as:  TUMS - dosed in mg elemental calcium Chew 1 tablet by mouth 2 (two) times daily.   cholecalciferol 1000 units tablet Commonly known as:  VITAMIN D Take 1,000 Units by mouth daily.   diltiazem 180 MG 24 hr capsule Commonly known as:  CARDIZEM CD Take 1 capsule (180 mg total) by mouth 2 (two) times daily. Please keep upcoming appt for future refills. Thank you   FLUoxetine 40 MG capsule Commonly known as:  PROZAC Take 40 mg by mouth daily.   furosemide 20 MG tablet Commonly known as:  LASIX Take 2 tablets (40 mg total) by mouth  daily. What changed:  how much to take   ipratropium-albuterol 0.5-2.5 (3) MG/3ML Soln Commonly known as:  DUONEB Take 3 mLs by nebulization 3 (three) times daily.   pantoprazole 40 MG tablet Commonly known as:  PROTONIX Take 40 mg by mouth daily.   potassium chloride SA 20 MEQ tablet Commonly known as:  K-DUR,KLOR-CON Take 20 mEq by mouth 2 (two) times daily.   risperiDONE 0.25 MG tablet Commonly known as:  RISPERDAL Take 0.25 mg by mouth 2 (two) times daily as needed.   THEREMS PO Take 1 tablet by mouth daily.   topiramate 50 MG tablet  Commonly known as:  TOPAMAX Take 1 tablet (50 mg total) by mouth 2 (two) times daily. What changed:  when to take this   traZODone 50 MG tablet Commonly known as:  DESYREL Take 50 mg by mouth at bedtime as needed.       Relevant Imaging Results:  Relevant Lab Results:   Additional Information SSN [id number]  Ihor Gully, LCSW

## 2018-05-31 NOTE — Discharge Summary (Signed)
Physician Discharge Summary  RAINE ELSASS OHY:[id number] DOB: 02/05/1933 DOA: 05/28/2018  PCP: Wenda Low, MD  Admit date: 05/28/2018 Discharge date: 05/31/2018  Admitted From: ALF Disposition:  ALF  Recommendations for Outpatient Follow-up:  1. Follow up with PCP in 1-2 weeks 2. Please obtain BMP/CBC in one week 3. Repeat chest xray in 3-4 weeks to ensure resolution of pneumonia  Discharge Condition:stable CODE STATUS:DNR Diet recommendation: heart healthy  Brief/Interim Summary: Elby Showers a 84 y.o.malenursing home resident with dementia was sent to the emergency department today with symptoms of fever up to 104, cough and chest congestion and some urinary incontinence.   He was noted to have a left lower lobe pneumonia   Discharge Diagnoses:  Principal Problem:   LLL pneumonia ([place]) Active Problems:   Essential hypertension   Dysphagia as late effect of stroke   GERD (gastroesophageal reflux disease)   MCI (mild cognitive impairment) with memory loss   Hypokalemia   Fever   Tachycardia-bradycardia (HCC)   Seizures (HCC)   PVD (peripheral vascular disease) (HCC)   Pneumonia   Persistent atrial fibrillation (HCC)   Pacemaker   OSA (obstructive sleep apnea)   Memory impairment   Coronary artery disease   COPD (chronic obstructive pulmonary disease) ([place])  1. Left lower lobe pneumonia.  Patient treated with intravenous Unasyn.  Seen by speech therapy and regular diet with thin liquids as recommended.  Fevers have resolved.  He is been transitioned to Augmentin.  He will need repeat chest x-ray in 3 to 4 weeks to ensure resolution of pneumonia. 2. Chronic atrial fibrillation.  Anticoagulated with Eliquis.  Heart rate is controlled. 3. COPD.  Continued on duo nebs every 6 hours.  No wheezing at this time. 4. Obstructive sleep apnea.  Continue on CPAP nightly 5. Dementia.  Appears to be stable at this time.  No evidence of delirium. 6. Epilepsy.  Resume home  medications 7. GERD.  Continue PPI  Discharge Instructions  Discharge Instructions    Diet - low sodium heart healthy   Complete by:  As directed    Increase activity slowly   Complete by:  As directed      Allergies as of 05/31/2018      Reactions   Aricept [donepezil Hcl] Other (See Comments)   "stomach pain", generic   Ceclor [cefaclor]    Namenda [memantine Hcl] Other (See Comments)   "dizzy", brand medication   Penicillins    Has patient had a PCN reaction causing immediate rash, facial/tongue/throat swelling, SOB or lightheadedness with hypotension: Unknown Has patient had a PCN reaction causing severe rash involving mucus membranes or skin necrosis: Unknown Has patient had a PCN reaction that required hospitalization: Unknown Has patient had a PCN reaction occurring within the last 10 years: Unknown If all of the above answers are "NO", then may proceed with Cephalosporin use.   Ramipril Other (See Comments)    cough      Medication List    TAKE these medications   acetaminophen 325 MG tablet Commonly known as:  TYLENOL Take 500 mg by mouth every 6 (six) hours as needed.   amoxicillin-clavulanate 875-125 MG tablet Commonly known as:  AUGMENTIN Take 1 tablet by mouth 2 (two) times daily for 3 days.   apixaban 5 MG Tabs tablet Commonly known as:  ELIQUIS Take 1 tablet (5 mg total) by mouth 2 (two) times daily.   atorvastatin 40 MG tablet Commonly known as:  LIPITOR TAKE 1 TABLET BY MOUTH  EVERY DAY - PATIENT DUE FOR LIPID PANEL   budesonide-formoterol 80-4.5 MCG/ACT inhaler Commonly known as:  SYMBICORT Inhale 2 puffs into the lungs daily.   buPROPion 75 MG tablet Commonly known as:  WELLBUTRIN Take 75 mg by mouth 2 (two) times daily.   calcium carbonate 500 MG chewable tablet Commonly known as:  TUMS - dosed in mg elemental calcium Chew 1 tablet by mouth 2 (two) times daily.   cholecalciferol 1000 units tablet Commonly known as:  VITAMIN D Take 1,000  Units by mouth daily.   diltiazem 180 MG 24 hr capsule Commonly known as:  CARDIZEM CD Take 1 capsule (180 mg total) by mouth 2 (two) times daily. Please keep upcoming appt for future refills. Thank you   FLUoxetine 40 MG capsule Commonly known as:  PROZAC Take 40 mg by mouth daily.   furosemide 20 MG tablet Commonly known as:  LASIX Take 2 tablets (40 mg total) by mouth daily. What changed:  how much to take   ipratropium-albuterol 0.5-2.5 (3) MG/3ML Soln Commonly known as:  DUONEB Take 3 mLs by nebulization 3 (three) times daily.   pantoprazole 40 MG tablet Commonly known as:  PROTONIX Take 40 mg by mouth daily.   potassium chloride SA 20 MEQ tablet Commonly known as:  K-DUR,KLOR-CON Take 20 mEq by mouth 2 (two) times daily.   risperiDONE 0.25 MG tablet Commonly known as:  RISPERDAL Take 0.25 mg by mouth 2 (two) times daily as needed.   THEREMS PO Take 1 tablet by mouth daily.   topiramate 50 MG tablet Commonly known as:  TOPAMAX Take 1 tablet (50 mg total) by mouth 2 (two) times daily. What changed:  when to take this   traZODone 50 MG tablet Commonly known as:  DESYREL Take 50 mg by mouth at bedtime as needed.       Allergies  Allergen Reactions  . Aricept [Donepezil Hcl] Other (See Comments)    "stomach pain", generic  . Ceclor [Cefaclor]   . Namenda [Memantine Hcl] Other (See Comments)    "dizzy", brand medication  . Penicillins     Has patient had a PCN reaction causing immediate rash, facial/tongue/throat swelling, SOB or lightheadedness with hypotension: Unknown Has patient had a PCN reaction causing severe rash involving mucus membranes or skin necrosis: Unknown Has patient had a PCN reaction that required hospitalization: Unknown Has patient had a PCN reaction occurring within the last 10 years: Unknown If all of the above answers are "NO", then may proceed with Cephalosporin use.   . Ramipril Other (See Comments)     cough     Consultations:     Procedures/Studies: Dg Chest 2 View  Result Date: 05/28/2018 CLINICAL DATA:  Fever and cough. EXAM: CHEST - 2 VIEW COMPARISON:  Chest x-ray dated August 10, 2017. FINDINGS: Unchanged left chest wall pacemaker. Stable cardiomegaly status post CABG. Normal pulmonary vascularity. Atherosclerotic calcification of the aortic arch. Increased patchy density in the left lower lobe. Small left pleural effusion. The right lung is clear. No pneumothorax. No acute osseous abnormality. IMPRESSION: 1. Patchy airspace disease in the left lower lobe, suspicious for pneumonia. 2. Small left pleural effusion. Electronically Signed   By: Titus Dubin M.D.   On: 05/28/2018 12:32   Dg Swallowing Func-speech Pathology  Result Date: 05/30/2018 Objective Swallowing Evaluation: Type of Study: MBS-Modified Barium Swallow Study  Patient Details Name: NICKHOLAS GOLDSTON MRN: [id number] Date of Birth: 08-Jun-1933 Today's Date: 05/30/2018 Time: SLP Start Time (ACUTE ONLY):  1130 -SLP Stop Time (ACUTE ONLY): 1203 SLP Time Calculation (min) (ACUTE ONLY): 33 min Past Medical History: Past Medical History: Diagnosis Date . COPD (chronic obstructive pulmonary disease) ([place])  . Coronary artery disease   s/p 5v CABG 2002 . CVA (cerebral infarction) 1999 . DDD (degenerative disc disease), cervical  . Dementia  . Depression  . GERD (gastroesophageal reflux disease)  . History of kidney stones  . Hyperlipidemia  . Hypertension  . Memory impairment  . OSA (obstructive sleep apnea)   Recently diagnosed though pt. has not had CPAP trial . Permanent atrial fibrillation (HCC)   chads2vasc score is 6 . Pneumonia   recent hospital admission for pneumonia  . Prostate cancer ([place]) 2006  s/p XRT . PVD (peripheral vascular disease) ([place])  . Seizures ([place])  . Tachycardia-bradycardia [facility])   s/p PPM by [facility] 6/12 Past Surgical History: Past Surgical History: Procedure Laterality Date . CARDIOVERSION N/A 08/21/2013  Procedure:  CARDIOVERSION;  Surgeon: Candee Furbish, MD;  Location: [place];  Service: Cardiovascular;  Laterality: N/A; . [facility] . CORONARY ARTERY BYPASS GRAFT  2002 . CYSTOSCOPY W/ URETERAL STENT PLACEMENT Left 05/13/2017  Procedure: CYSTOSCOPY WITH RETROGRADE PYELOGRAM/URETERAL STENT PLACEMENT LEFT;  Surgeon: Alexis Frock, MD;  Location: [facility];  Service: Urology;  Laterality: Left; . CYSTOSCOPY WITH RETROGRADE PYELOGRAM, URETEROSCOPY AND STENT PLACEMENT Left 06/09/2017  Procedure: CYSTOSCOPY WITH RETROGRADE PYELOGRAM, URETEROSCOPY;  Surgeon: Alexis Frock, MD;  Location: [facility];  Service: Urology;  Laterality: Left; . HOLMIUM LASER APPLICATION Left 0/81/4481  Procedure: HOLMIUM LASER APPLICATION;  Surgeon: Alexis Frock, MD;  Location: [facility];  Service: Urology;  Laterality: Left; . INSERT / REPLACE / REMOVE PACEMAKER   . NECK SURGERY    hx of  . PACEMAKER INSERTION    implanted by [facility] 6/12 HPI: MENASHE KAFER is a 84 y.o. male nursing home resident with dementia was sent to the emergency department today with symptoms of fever up to 104, cough and chest congestion and some urinary incontinence.   He was noted to have a left lower lobe pneumonia and there is concern that he has been aspirating. BSE requested.  Subjective: "Fine." Assessment / Plan / Recommendation CHL IP CLINICAL IMPRESSIONS 05/30/2018 Clinical Impression Oropharyngeal swallow essentially WNL for age with min delay in swallow initiation with swallow trigger at the level of the valleculae and occasional spill to pyriforms with larger sips thin. Regular textures were retained in the valleculae for ~7 seconds before swallow trigger. Pt with one single episode of flash penetration (underepiglottic coating) with straw sips thin and no aspiration or retention observed. Recommend regular textures and thin liquids. Pt would benefit from supervision with meals due to dementia. No further SLP services indicated at this time. Above to RN.  SLP Visit Diagnosis  Dysphagia, oropharyngeal phase (R13.12) Attention and concentration deficit following -- Frontal lobe and executive function deficit following -- Impact on safety and function Mild aspiration risk   CHL IP TREATMENT RECOMMENDATION 05/30/2018 Treatment Recommendations No treatment recommended at this time   Prognosis 05/30/2018 Prognosis for Safe Diet Advancement Good Barriers to Reach Goals Cognitive deficits Barriers/Prognosis Comment -- CHL IP DIET RECOMMENDATION 05/30/2018 SLP Diet Recommendations Regular solids;Thin liquid Liquid Administration via Cup;Straw Medication Administration Whole meds with liquid Compensations Slow rate;Minimize environmental distractions Postural Changes Remain semi-upright after after feeds/meals (Comment);Seated upright at 90 degrees   CHL IP OTHER RECOMMENDATIONS 05/30/2018 Recommended Consults -- Oral Care Recommendations Oral care BID;Staff/trained caregiver to provide oral care Other Recommendations Clarify  dietary restrictions   CHL IP FOLLOW UP RECOMMENDATIONS 05/30/2018 Follow up Recommendations 24 hour supervision/assistance   CHL IP FREQUENCY AND DURATION 05/29/2018 Speech Therapy Frequency (ACUTE ONLY) min 2x/week Treatment Duration 1 week      CHL IP ORAL PHASE 05/30/2018 Oral Phase WFL Oral - Pudding Teaspoon -- Oral - Pudding Cup -- Oral - Honey Teaspoon -- Oral - Honey Cup -- Oral - Nectar Teaspoon -- Oral - Nectar Cup -- Oral - Nectar Straw -- Oral - Thin Teaspoon -- Oral - Thin Cup -- Oral - Thin Straw -- Oral - Puree -- Oral - Mech Soft -- Oral - Regular -- Oral - Multi-Consistency -- Oral - Pill -- Oral Phase - Comment --  CHL IP PHARYNGEAL PHASE 05/30/2018 Pharyngeal Phase Impaired Pharyngeal- Pudding Teaspoon -- Pharyngeal -- Pharyngeal- Pudding Cup -- Pharyngeal -- Pharyngeal- Honey Teaspoon -- Pharyngeal -- Pharyngeal- Honey Cup -- Pharyngeal -- Pharyngeal- Nectar Teaspoon -- Pharyngeal -- Pharyngeal- Nectar Cup -- Pharyngeal -- Pharyngeal- Nectar Straw -- Pharyngeal  -- Pharyngeal- Thin Teaspoon Delayed swallow initiation-vallecula Pharyngeal -- Pharyngeal- Thin Cup Delayed swallow initiation-vallecula;Delayed swallow initiation-pyriform sinuses Pharyngeal -- Pharyngeal- Thin Straw Delayed swallow initiation-pyriform sinuses;Penetration/Aspiration during swallow Pharyngeal Material does not enter airway;Material enters airway, remains ABOVE vocal cords then ejected out Pharyngeal- Puree WFL;Delayed swallow initiation-vallecula Pharyngeal -- Pharyngeal- Mechanical Soft -- Pharyngeal -- Pharyngeal- Regular Delayed swallow initiation-vallecula Pharyngeal -- Pharyngeal- Multi-consistency -- Pharyngeal -- Pharyngeal- Pill WFL Pharyngeal -- Pharyngeal Comment --  CHL IP CERVICAL ESOPHAGEAL PHASE 05/30/2018 Cervical Esophageal Phase WFL Pudding Teaspoon -- Pudding Cup -- Honey Teaspoon -- Honey Cup -- Nectar Teaspoon -- Nectar Cup -- Nectar Straw -- Thin Teaspoon -- Thin Cup -- Thin Straw -- Puree -- Mechanical Soft -- Regular -- Multi-consistency -- Pill -- Cervical Esophageal Comment -- Thank you, Genene Churn, CCC-SLP [phone] PORTER,DABNEY 05/30/2018, 12:29 PM                  Subjective: No shortness of breath or cough  Discharge Exam: Vitals:   05/31/18 0725 05/31/18 0730  BP:    Pulse:    Resp:    Temp:    SpO2: 90% 90%   Vitals:   05/30/18 2118 05/31/18 0630 05/31/18 0725 05/31/18 0730  BP: 136/82 134/74    Pulse: 95 99    Resp: 18 20    Temp: 98.7 F (37.1 C) 98.1 F (36.7 C)    TempSrc: Oral Oral    SpO2: 95% 90% 90% 90%  Weight:      Height:        General: Pt is alert, awake, not in acute distress Cardiovascular: RRR, S1/S2 +, no rubs, no gallops Respiratory: CTA bilaterally, no wheezing, no rhonchi Abdominal: Soft, NT, ND, bowel sounds + Extremities: no edema, no cyanosis    The results of significant diagnostics from this hospitalization (including imaging, microbiology, ancillary and laboratory) are listed below for reference.      Microbiology: Recent Results (from the past 240 hour(s))  MRSA PCR Screening     Status: None   Collection Time: 05/28/18 10:05 AM  Result Value Ref Range Status   MRSA by PCR NEGATIVE NEGATIVE Final    Comment:        The GeneXpert MRSA Assay (FDA approved for NASAL specimens only), is one component of a comprehensive MRSA colonization surveillance program. It is not intended to diagnose MRSA infection nor to guide or monitor treatment for MRSA infections. Performed at [facility], [address],  [place], [place] [address]   Culture, blood (routine x 2)     Status: None (Preliminary result)   Collection Time: 05/28/18 11:08 AM  Result Value Ref Range Status   Specimen Description   Final    RIGHT ANTECUBITAL BOTTLES DRAWN AEROBIC AND ANAEROBIC   Special Requests   Final    Blood Culture results may not be optimal due to an excessive volume of blood received in culture bottles   Culture   Final    NO GROWTH 3 DAYS Performed at [facility], [address] [place], [place] [address]    Report Status PENDING  Incomplete  Culture, blood (routine x 2)     Status: None (Preliminary result)   Collection Time: 05/28/18 11:09 AM  Result Value Ref Range Status   Specimen Description   Final    LEFT ANTECUBITAL BOTTLES DRAWN AEROBIC AND ANAEROBIC   Special Requests Blood Culture adequate volume  Final   Culture   Final    NO GROWTH 3 DAYS Performed at [facility], [address] [place], [place] [address]    Report Status PENDING  Incomplete  Urine culture     Status: None   Collection Time: 05/28/18 11:25 AM  Result Value Ref Range Status   Specimen Description   Final    URINE, CATHETERIZED Performed at [facility], [address], [place], [place] [address]    Special Requests   Final    NONE Performed at [facility], [address], [place], [place] [address]    Culture   Final    NO GROWTH Performed at [facility], [place] [address], [place], [place]  [address]    Report Status 05/29/2018 FINAL  Final  Group A Strep by PCR     Status: None   Collection Time: 05/28/18 11:31 AM  Result Value Ref Range Status   Group A Strep by PCR NOT DETECTED NOT DETECTED Final    Comment: Performed at [facility], [address], [place],  [address]     Labs: BNP (last 3 results) Recent Labs    07/10/17 2042  BNP 709.6*   Basic Metabolic Panel: Recent Labs  Lab 05/28/18 1108 05/29/18 0559 05/30/18 0437  NA 144 142 145  K 3.3* 3.3* 3.7  CL 108 112* 110  CO2 28 24 26   GLUCOSE 113* 105* 102*  BUN 21 17 16   CREATININE 1.09 0.85 0.85  CALCIUM 8.8* 8.3* 8.5*  MG  --   --  2.1   Liver Function Tests: Recent Labs  Lab 05/28/18 1108 05/29/18 0559  AST 24 25  ALT 18 16  ALKPHOS 72 64  BILITOT 0.6 0.7  PROT 6.3* 6.2*  ALBUMIN 3.4* 3.0*   No results for input(s): LIPASE, AMYLASE in the last 168 hours. No results for input(s): AMMONIA in the last 168 hours. CBC: Recent Labs  Lab 05/28/18 1108 05/29/18 0559  WBC 17.9* 9.3  NEUTROABS 15.4* 6.3  HGB 12.4* 11.8*  HCT 39.9 39.6  MCV 93.7 94.3  PLT 194 188   Cardiac Enzymes: Recent Labs  Lab 05/28/18 1108  TROPONINI 0.03*   BNP: Invalid input(s): POCBNP CBG: No results for input(s): GLUCAP in the last 168 hours. D-Dimer No results for input(s): DDIMER in the last 72 hours. Hgb A1c No results for input(s): HGBA1C in the last 72 hours. Lipid Profile No results for input(s): CHOL, HDL, LDLCALC, TRIG, CHOLHDL, LDLDIRECT in the last 72 hours. Thyroid function studies No results  for input(s): TSH, T4TOTAL, T3FREE, THYROIDAB in the last 72 hours.  Invalid input(s): FREET3 Anemia work up No results for input(s): VITAMINB12, FOLATE, FERRITIN, TIBC, IRON, RETICCTPCT in the last 72 hours. Urinalysis    Component Value Date/Time   COLORURINE YELLOW 05/28/2018 1125   APPEARANCEUR CLEAR 05/28/2018 1125   LABSPEC 1.016 05/28/2018 1125   PHURINE 7.0 05/28/2018 1125    GLUCOSEU NEGATIVE 05/28/2018 1125   HGBUR NEGATIVE 05/28/2018 1125   BILIRUBINUR NEGATIVE 05/28/2018 1125   KETONESUR NEGATIVE 05/28/2018 1125   PROTEINUR NEGATIVE 05/28/2018 1125   UROBILINOGEN 0.2 10/23/2014 2359   NITRITE NEGATIVE 05/28/2018 1125   LEUKOCYTESUR NEGATIVE 05/28/2018 1125   Sepsis Labs Invalid input(s): PROCALCITONIN,  WBC,  LACTICIDVEN Microbiology Recent Results (from the past 240 hour(s))  MRSA PCR Screening     Status: None   Collection Time: 05/28/18 10:05 AM  Result Value Ref Range Status   MRSA by PCR NEGATIVE NEGATIVE Final    Comment:        The GeneXpert MRSA Assay (FDA approved for NASAL specimens only), is one component of a comprehensive MRSA colonization surveillance program. It is not intended to diagnose MRSA infection nor to guide or monitor treatment for MRSA infections. Performed at [facility], [address] [place], [place] [address]   Culture, blood (routine x 2)     Status: None (Preliminary result)   Collection Time: 05/28/18 11:08 AM  Result Value Ref Range Status   Specimen Description   Final    RIGHT ANTECUBITAL BOTTLES DRAWN AEROBIC AND ANAEROBIC   Special Requests   Final    Blood Culture results may not be optimal due to an excessive volume of blood received in culture bottles   Culture   Final    NO GROWTH 3 DAYS Performed at [facility], [address], [place], [place] [address]    Report Status PENDING  Incomplete  Culture, blood (routine x 2)     Status: None (Preliminary result)   Collection Time: 05/28/18 11:09 AM  Result Value Ref Range Status   Specimen Description   Final    LEFT ANTECUBITAL BOTTLES DRAWN AEROBIC AND ANAEROBIC   Special Requests Blood Culture adequate volume  Final   Culture   Final    NO GROWTH 3 DAYS Performed at [facility], [address] [place], [place] [address]    Report Status PENDING  Incomplete  Urine culture     Status: None   Collection Time: 05/28/18 11:25 AM  Result Value  Ref Range Status   Specimen Description   Final    URINE, CATHETERIZED Performed at [facility], [address] [place], [place] [address]    Special Requests   Final    NONE Performed at [facility], [address], [place], [place] [address]    Culture   Final    NO GROWTH Performed at [facility], [place] [address] [place], [place] [address]    Report Status 05/29/2018 FINAL  Final  Group A Strep by PCR     Status: None   Collection Time: 05/28/18 11:31 AM  Result Value Ref Range Status   Group A Strep by PCR NOT DETECTED NOT DETECTED Final    Comment: Performed at [facility], [address], [place], [place] [address]     Time coordinating discharge: 23mins  SIGNED:   Kathie Dike, MD  [facility] 05/31/2018, 11:34 AM Pager   If 7PM-7AM, please contact night-coverage [email or web address] Password [email or web address]

## 2018-05-31 NOTE — Clinical Social Work Note (Signed)
Discharge clinicals sent to facility.  LCSW attempted to call the facility but was left on hold for more than eight minutes.   EMS transport arranged.   Message left for spouse.  LCSW signing off.    Alaine Loughney, Clydene Pugh, LCSW

## 2018-06-02 DIAGNOSIS — R269 Unspecified abnormalities of gait and mobility: Secondary | ICD-10-CM | POA: Diagnosis not present

## 2018-06-02 DIAGNOSIS — F0150 Vascular dementia without behavioral disturbance: Secondary | ICD-10-CM | POA: Diagnosis not present

## 2018-06-02 LAB — CULTURE, BLOOD (ROUTINE X 2)
Culture: NO GROWTH
Culture: NO GROWTH
Special Requests: ADEQUATE

## 2018-06-06 DIAGNOSIS — I482 Chronic atrial fibrillation: Secondary | ICD-10-CM | POA: Diagnosis not present

## 2018-06-06 DIAGNOSIS — I10 Essential (primary) hypertension: Secondary | ICD-10-CM | POA: Diagnosis not present

## 2018-06-06 DIAGNOSIS — R600 Localized edema: Secondary | ICD-10-CM | POA: Diagnosis not present

## 2018-06-06 DIAGNOSIS — J449 Chronic obstructive pulmonary disease, unspecified: Secondary | ICD-10-CM | POA: Diagnosis not present

## 2018-06-07 DIAGNOSIS — R269 Unspecified abnormalities of gait and mobility: Secondary | ICD-10-CM | POA: Diagnosis not present

## 2018-06-07 DIAGNOSIS — F0150 Vascular dementia without behavioral disturbance: Secondary | ICD-10-CM | POA: Diagnosis not present

## 2018-06-08 DIAGNOSIS — E119 Type 2 diabetes mellitus without complications: Secondary | ICD-10-CM | POA: Diagnosis not present

## 2018-06-08 DIAGNOSIS — D518 Other vitamin B12 deficiency anemias: Secondary | ICD-10-CM | POA: Diagnosis not present

## 2018-06-08 DIAGNOSIS — E038 Other specified hypothyroidism: Secondary | ICD-10-CM | POA: Diagnosis not present

## 2018-06-10 DIAGNOSIS — R269 Unspecified abnormalities of gait and mobility: Secondary | ICD-10-CM | POA: Diagnosis not present

## 2018-06-10 DIAGNOSIS — F0150 Vascular dementia without behavioral disturbance: Secondary | ICD-10-CM | POA: Diagnosis not present

## 2018-06-14 ENCOUNTER — Ambulatory Visit (INDEPENDENT_AMBULATORY_CARE_PROVIDER_SITE_OTHER): Payer: Medicare Other | Admitting: Urology

## 2018-06-14 DIAGNOSIS — N3941 Urge incontinence: Secondary | ICD-10-CM | POA: Diagnosis not present

## 2018-06-14 DIAGNOSIS — R351 Nocturia: Secondary | ICD-10-CM | POA: Diagnosis not present

## 2018-06-14 DIAGNOSIS — F0150 Vascular dementia without behavioral disturbance: Secondary | ICD-10-CM | POA: Diagnosis not present

## 2018-06-14 DIAGNOSIS — R269 Unspecified abnormalities of gait and mobility: Secondary | ICD-10-CM | POA: Diagnosis not present

## 2018-06-14 DIAGNOSIS — Z8546 Personal history of malignant neoplasm of prostate: Secondary | ICD-10-CM

## 2018-06-17 DIAGNOSIS — F0150 Vascular dementia without behavioral disturbance: Secondary | ICD-10-CM | POA: Diagnosis not present

## 2018-06-17 DIAGNOSIS — R269 Unspecified abnormalities of gait and mobility: Secondary | ICD-10-CM | POA: Diagnosis not present

## 2018-06-21 DIAGNOSIS — F0150 Vascular dementia without behavioral disturbance: Secondary | ICD-10-CM | POA: Diagnosis not present

## 2018-06-21 DIAGNOSIS — I739 Peripheral vascular disease, unspecified: Secondary | ICD-10-CM | POA: Diagnosis not present

## 2018-06-21 DIAGNOSIS — B351 Tinea unguium: Secondary | ICD-10-CM | POA: Diagnosis not present

## 2018-06-21 DIAGNOSIS — M79675 Pain in left toe(s): Secondary | ICD-10-CM | POA: Diagnosis not present

## 2018-06-21 DIAGNOSIS — M2041 Other hammer toe(s) (acquired), right foot: Secondary | ICD-10-CM | POA: Diagnosis not present

## 2018-06-21 DIAGNOSIS — R269 Unspecified abnormalities of gait and mobility: Secondary | ICD-10-CM | POA: Diagnosis not present

## 2018-06-21 DIAGNOSIS — L84 Corns and callosities: Secondary | ICD-10-CM | POA: Diagnosis not present

## 2018-06-23 DIAGNOSIS — E119 Type 2 diabetes mellitus without complications: Secondary | ICD-10-CM | POA: Diagnosis not present

## 2018-06-23 DIAGNOSIS — R269 Unspecified abnormalities of gait and mobility: Secondary | ICD-10-CM | POA: Diagnosis not present

## 2018-06-23 DIAGNOSIS — D518 Other vitamin B12 deficiency anemias: Secondary | ICD-10-CM | POA: Diagnosis not present

## 2018-06-23 DIAGNOSIS — E038 Other specified hypothyroidism: Secondary | ICD-10-CM | POA: Diagnosis not present

## 2018-06-23 DIAGNOSIS — F0150 Vascular dementia without behavioral disturbance: Secondary | ICD-10-CM | POA: Diagnosis not present

## 2018-06-27 DIAGNOSIS — R269 Unspecified abnormalities of gait and mobility: Secondary | ICD-10-CM | POA: Diagnosis not present

## 2018-06-27 DIAGNOSIS — F0150 Vascular dementia without behavioral disturbance: Secondary | ICD-10-CM | POA: Diagnosis not present

## 2018-07-01 DIAGNOSIS — R269 Unspecified abnormalities of gait and mobility: Secondary | ICD-10-CM | POA: Diagnosis not present

## 2018-07-01 DIAGNOSIS — F0150 Vascular dementia without behavioral disturbance: Secondary | ICD-10-CM | POA: Diagnosis not present

## 2018-07-04 DIAGNOSIS — R269 Unspecified abnormalities of gait and mobility: Secondary | ICD-10-CM | POA: Diagnosis not present

## 2018-07-04 DIAGNOSIS — F0150 Vascular dementia without behavioral disturbance: Secondary | ICD-10-CM | POA: Diagnosis not present

## 2018-07-07 DIAGNOSIS — I10 Essential (primary) hypertension: Secondary | ICD-10-CM | POA: Diagnosis not present

## 2018-07-07 DIAGNOSIS — J449 Chronic obstructive pulmonary disease, unspecified: Secondary | ICD-10-CM | POA: Diagnosis not present

## 2018-07-07 DIAGNOSIS — I482 Chronic atrial fibrillation: Secondary | ICD-10-CM | POA: Diagnosis not present

## 2018-07-07 DIAGNOSIS — I5032 Chronic diastolic (congestive) heart failure: Secondary | ICD-10-CM | POA: Diagnosis not present

## 2018-07-08 DIAGNOSIS — R269 Unspecified abnormalities of gait and mobility: Secondary | ICD-10-CM | POA: Diagnosis not present

## 2018-07-08 DIAGNOSIS — F0150 Vascular dementia without behavioral disturbance: Secondary | ICD-10-CM | POA: Diagnosis not present

## 2018-07-11 DIAGNOSIS — R269 Unspecified abnormalities of gait and mobility: Secondary | ICD-10-CM | POA: Diagnosis not present

## 2018-07-11 DIAGNOSIS — F0150 Vascular dementia without behavioral disturbance: Secondary | ICD-10-CM | POA: Diagnosis not present

## 2018-07-15 DIAGNOSIS — R269 Unspecified abnormalities of gait and mobility: Secondary | ICD-10-CM | POA: Diagnosis not present

## 2018-07-15 DIAGNOSIS — F0150 Vascular dementia without behavioral disturbance: Secondary | ICD-10-CM | POA: Diagnosis not present

## 2018-07-18 DIAGNOSIS — R269 Unspecified abnormalities of gait and mobility: Secondary | ICD-10-CM | POA: Diagnosis not present

## 2018-07-18 DIAGNOSIS — F0150 Vascular dementia without behavioral disturbance: Secondary | ICD-10-CM | POA: Diagnosis not present

## 2018-07-19 DIAGNOSIS — Z23 Encounter for immunization: Secondary | ICD-10-CM | POA: Diagnosis not present

## 2018-07-22 DIAGNOSIS — E7849 Other hyperlipidemia: Secondary | ICD-10-CM | POA: Diagnosis not present

## 2018-07-22 DIAGNOSIS — D518 Other vitamin B12 deficiency anemias: Secondary | ICD-10-CM | POA: Diagnosis not present

## 2018-07-22 DIAGNOSIS — E119 Type 2 diabetes mellitus without complications: Secondary | ICD-10-CM | POA: Diagnosis not present

## 2018-07-22 DIAGNOSIS — E038 Other specified hypothyroidism: Secondary | ICD-10-CM | POA: Diagnosis not present

## 2018-07-22 DIAGNOSIS — E559 Vitamin D deficiency, unspecified: Secondary | ICD-10-CM | POA: Diagnosis not present

## 2018-07-22 DIAGNOSIS — F0150 Vascular dementia without behavioral disturbance: Secondary | ICD-10-CM | POA: Diagnosis not present

## 2018-07-22 DIAGNOSIS — R269 Unspecified abnormalities of gait and mobility: Secondary | ICD-10-CM | POA: Diagnosis not present

## 2018-07-22 DIAGNOSIS — Z79899 Other long term (current) drug therapy: Secondary | ICD-10-CM | POA: Diagnosis not present

## 2018-07-23 DIAGNOSIS — D518 Other vitamin B12 deficiency anemias: Secondary | ICD-10-CM | POA: Diagnosis not present

## 2018-07-23 DIAGNOSIS — E038 Other specified hypothyroidism: Secondary | ICD-10-CM | POA: Diagnosis not present

## 2018-07-23 DIAGNOSIS — E119 Type 2 diabetes mellitus without complications: Secondary | ICD-10-CM | POA: Diagnosis not present

## 2018-07-26 DIAGNOSIS — R269 Unspecified abnormalities of gait and mobility: Secondary | ICD-10-CM | POA: Diagnosis not present

## 2018-07-26 DIAGNOSIS — F0150 Vascular dementia without behavioral disturbance: Secondary | ICD-10-CM | POA: Diagnosis not present

## 2018-07-29 DIAGNOSIS — N390 Urinary tract infection, site not specified: Secondary | ICD-10-CM | POA: Diagnosis not present

## 2018-08-04 DIAGNOSIS — I5032 Chronic diastolic (congestive) heart failure: Secondary | ICD-10-CM | POA: Diagnosis not present

## 2018-08-04 DIAGNOSIS — J449 Chronic obstructive pulmonary disease, unspecified: Secondary | ICD-10-CM | POA: Diagnosis not present

## 2018-08-04 DIAGNOSIS — I10 Essential (primary) hypertension: Secondary | ICD-10-CM | POA: Diagnosis not present

## 2018-08-04 DIAGNOSIS — R05 Cough: Secondary | ICD-10-CM | POA: Diagnosis not present

## 2018-08-08 ENCOUNTER — Telehealth: Payer: Self-pay | Admitting: Cardiology

## 2018-08-08 ENCOUNTER — Ambulatory Visit (INDEPENDENT_AMBULATORY_CARE_PROVIDER_SITE_OTHER): Payer: Medicare Other | Admitting: *Deleted

## 2018-08-08 DIAGNOSIS — I4891 Unspecified atrial fibrillation: Secondary | ICD-10-CM

## 2018-08-08 DIAGNOSIS — I495 Sick sinus syndrome: Secondary | ICD-10-CM

## 2018-08-08 NOTE — Telephone Encounter (Signed)
LMOVM reminding pt to send remote transmission.   

## 2018-08-09 NOTE — Progress Notes (Signed)
Remote pacemaker transmission.   

## 2018-08-11 DIAGNOSIS — M6281 Muscle weakness (generalized): Secondary | ICD-10-CM | POA: Diagnosis not present

## 2018-08-15 DIAGNOSIS — M6281 Muscle weakness (generalized): Secondary | ICD-10-CM | POA: Diagnosis not present

## 2018-08-18 DIAGNOSIS — M6281 Muscle weakness (generalized): Secondary | ICD-10-CM | POA: Diagnosis not present

## 2018-08-23 DIAGNOSIS — E119 Type 2 diabetes mellitus without complications: Secondary | ICD-10-CM | POA: Diagnosis not present

## 2018-08-23 DIAGNOSIS — D518 Other vitamin B12 deficiency anemias: Secondary | ICD-10-CM | POA: Diagnosis not present

## 2018-08-23 DIAGNOSIS — M6281 Muscle weakness (generalized): Secondary | ICD-10-CM | POA: Diagnosis not present

## 2018-08-23 DIAGNOSIS — E038 Other specified hypothyroidism: Secondary | ICD-10-CM | POA: Diagnosis not present

## 2018-08-25 DIAGNOSIS — M6281 Muscle weakness (generalized): Secondary | ICD-10-CM | POA: Diagnosis not present

## 2018-08-29 DIAGNOSIS — M6281 Muscle weakness (generalized): Secondary | ICD-10-CM | POA: Diagnosis not present

## 2018-09-01 DIAGNOSIS — F0280 Dementia in other diseases classified elsewhere without behavioral disturbance: Secondary | ICD-10-CM | POA: Diagnosis not present

## 2018-09-01 DIAGNOSIS — M6281 Muscle weakness (generalized): Secondary | ICD-10-CM | POA: Diagnosis not present

## 2018-09-01 DIAGNOSIS — I10 Essential (primary) hypertension: Secondary | ICD-10-CM | POA: Diagnosis not present

## 2018-09-01 DIAGNOSIS — I5032 Chronic diastolic (congestive) heart failure: Secondary | ICD-10-CM | POA: Diagnosis not present

## 2018-09-01 DIAGNOSIS — J449 Chronic obstructive pulmonary disease, unspecified: Secondary | ICD-10-CM | POA: Diagnosis not present

## 2018-09-05 DIAGNOSIS — M6281 Muscle weakness (generalized): Secondary | ICD-10-CM | POA: Diagnosis not present

## 2018-09-08 DIAGNOSIS — M6281 Muscle weakness (generalized): Secondary | ICD-10-CM | POA: Diagnosis not present

## 2018-09-12 DIAGNOSIS — M6281 Muscle weakness (generalized): Secondary | ICD-10-CM | POA: Diagnosis not present

## 2018-09-13 DIAGNOSIS — M6281 Muscle weakness (generalized): Secondary | ICD-10-CM | POA: Diagnosis not present

## 2018-09-19 DIAGNOSIS — M6281 Muscle weakness (generalized): Secondary | ICD-10-CM | POA: Diagnosis not present

## 2018-09-23 DIAGNOSIS — E038 Other specified hypothyroidism: Secondary | ICD-10-CM | POA: Diagnosis not present

## 2018-09-23 DIAGNOSIS — E119 Type 2 diabetes mellitus without complications: Secondary | ICD-10-CM | POA: Diagnosis not present

## 2018-09-23 DIAGNOSIS — D518 Other vitamin B12 deficiency anemias: Secondary | ICD-10-CM | POA: Diagnosis not present

## 2018-09-23 DIAGNOSIS — M6281 Muscle weakness (generalized): Secondary | ICD-10-CM | POA: Diagnosis not present

## 2018-09-26 DIAGNOSIS — M6281 Muscle weakness (generalized): Secondary | ICD-10-CM | POA: Diagnosis not present

## 2018-09-27 DIAGNOSIS — F0391 Unspecified dementia with behavioral disturbance: Secondary | ICD-10-CM | POA: Diagnosis not present

## 2018-09-27 DIAGNOSIS — F1011 Alcohol abuse, in remission: Secondary | ICD-10-CM | POA: Diagnosis not present

## 2018-09-27 DIAGNOSIS — F05 Delirium due to known physiological condition: Secondary | ICD-10-CM | POA: Diagnosis not present

## 2018-09-29 DIAGNOSIS — I4811 Longstanding persistent atrial fibrillation: Secondary | ICD-10-CM | POA: Diagnosis not present

## 2018-09-29 DIAGNOSIS — B351 Tinea unguium: Secondary | ICD-10-CM | POA: Diagnosis not present

## 2018-09-29 DIAGNOSIS — I5032 Chronic diastolic (congestive) heart failure: Secondary | ICD-10-CM | POA: Diagnosis not present

## 2018-09-29 DIAGNOSIS — F0280 Dementia in other diseases classified elsewhere without behavioral disturbance: Secondary | ICD-10-CM | POA: Diagnosis not present

## 2018-09-29 DIAGNOSIS — M6281 Muscle weakness (generalized): Secondary | ICD-10-CM | POA: Diagnosis not present

## 2018-09-29 DIAGNOSIS — J449 Chronic obstructive pulmonary disease, unspecified: Secondary | ICD-10-CM | POA: Diagnosis not present

## 2018-09-29 DIAGNOSIS — I739 Peripheral vascular disease, unspecified: Secondary | ICD-10-CM | POA: Diagnosis not present

## 2018-09-29 DIAGNOSIS — M79675 Pain in left toe(s): Secondary | ICD-10-CM | POA: Diagnosis not present

## 2018-10-03 DIAGNOSIS — M6281 Muscle weakness (generalized): Secondary | ICD-10-CM | POA: Diagnosis not present

## 2018-10-03 LAB — CUP PACEART REMOTE DEVICE CHECK
Battery Impedance: 523 Ohm
Battery Voltage: 2.78 V
Date Time Interrogation Session: 20191028174400
Implantable Lead Implant Date: 20120609
Implantable Lead Location: 753860
Implantable Lead Model: 5076
Lead Channel Impedance Value: 67 Ohm
Lead Channel Pacing Threshold Amplitude: 0.875 V
Lead Channel Setting Pacing Amplitude: 2.5 V
MDC IDC LEAD IMPLANT DT: 20120609
MDC IDC LEAD LOCATION: 753859
MDC IDC MSMT BATTERY REMAINING LONGEVITY: 100 mo
MDC IDC MSMT LEADCHNL RV IMPEDANCE VALUE: 427 Ohm
MDC IDC MSMT LEADCHNL RV PACING THRESHOLD PULSEWIDTH: 0.4 ms
MDC IDC PG IMPLANT DT: 20120609
MDC IDC SET LEADCHNL RV PACING PULSEWIDTH: 0.4 ms
MDC IDC SET LEADCHNL RV SENSING SENSITIVITY: 2.8 mV
MDC IDC STAT BRADY RV PERCENT PACED: 19 %

## 2018-10-06 DIAGNOSIS — M6281 Muscle weakness (generalized): Secondary | ICD-10-CM | POA: Diagnosis not present

## 2018-10-26 ENCOUNTER — Encounter: Payer: Self-pay | Admitting: Nurse Practitioner

## 2018-10-26 NOTE — Progress Notes (Signed)
Electrophysiology Office Note Date: 10/27/2018  ID:  Zachary Warren, DOB [birth year], MRN [id number]  PCP: Merrily Brittle Primary Cardiologist: Marlou Porch Electrophysiologist: Allred  CC: Pacemaker follow-up  Zachary Warren is a 85 y.o. male seen today for Dr Rayann Heman.  He presents today for routine electrophysiology followup.  Since last being seen in our clinic, the patient reports doing relatively well.  He is now living in assisted living. He states he is "doing ok" today. He is here with his wife who reports he has been stable. He has some RLE edema but was intolerant of compression hose and wraps.  He denies chest pain, palpitations, dyspnea, PND, orthopnea, nausea, vomiting, syncope, weight gain, or early satiety.  Device History: MDT dual chamber PPM implanted 2012 for tachy/brady   Past Medical History:  Diagnosis Date  . COPD (chronic obstructive pulmonary disease) ([place])   . Coronary artery disease    s/p 5v CABG 2002  . CVA (cerebral infarction) 1999  . DDD (degenerative disc disease), cervical   . Dementia ([place])   . Depression   . GERD (gastroesophageal reflux disease)   . History of kidney stones   . Hyperlipidemia   . Hypertension   . Memory impairment   . OSA (obstructive sleep apnea)    Recently diagnosed though pt. has not had CPAP trial  . Permanent atrial fibrillation    chads2vasc score is 6  . Pneumonia    recent hospital admission for pneumonia   . Prostate cancer ([place]) 2006   s/p XRT  . PVD (peripheral vascular disease) ([place])   . Seizures ([place])   . Tachycardia-bradycardia [facility])    s/p PPM by [facility] 6/12   Past Surgical History:  Procedure Laterality Date  . CARDIOVERSION N/A 08/21/2013   Procedure: CARDIOVERSION;  Surgeon: Candee Furbish, MD;  Location: [place];  Service: Cardiovascular;  Laterality: N/A;  . [place]  . CORONARY ARTERY BYPASS GRAFT  2002  . CYSTOSCOPY W/ URETERAL STENT PLACEMENT Left 05/13/2017   Procedure: CYSTOSCOPY WITH  RETROGRADE PYELOGRAM/URETERAL STENT PLACEMENT LEFT;  Surgeon: Alexis Frock, MD;  [facility];  Service: Urology;  Laterality: Left;  . CYSTOSCOPY WITH RETROGRADE PYELOGRAM, URETEROSCOPY AND STENT PLACEMENT Left 06/09/2017   Procedure: CYSTOSCOPY WITH RETROGRADE PYELOGRAM, URETEROSCOPY;  Surgeon: Alexis Frock, MD;  [facility];  Service: Urology;  Laterality: Left;  . HOLMIUM LASER APPLICATION Left 3/55/7322   Procedure: HOLMIUM LASER APPLICATION;  Surgeon: Alexis Frock, MD;  [facility];  Service: Urology;  Laterality: Left;  . INSERT / REPLACE / REMOVE PACEMAKER    . NECK SURGERY     hx of   . PACEMAKER INSERTION     implanted by JA 6/12    Current Outpatient Medications  Medication Sig Dispense Refill  . acetaminophen (TYLENOL) 325 MG tablet Take 500 mg by mouth every 6 (six) hours as needed.    Marland Kitchen apixaban (ELIQUIS) 5 MG TABS tablet Take 1 tablet (5 mg total) by mouth 2 (two) times daily. 60 tablet 11  . atorvastatin (LIPITOR) 40 MG tablet TAKE 1 TABLET BY MOUTH EVERY DAY - PATIENT DUE FOR LIPID PANEL 90 tablet 1  . budesonide-formoterol (SYMBICORT) 80-4.5 MCG/ACT inhaler Inhale 2 puffs into the lungs daily.    Marland Kitchen buPROPion (WELLBUTRIN) 75 MG tablet Take 75 mg by mouth 2 (two) times daily.    . calcium carbonate (TUMS - DOSED IN MG ELEMENTAL CALCIUM) 500 MG chewable tablet Chew 1 tablet by mouth 2 (two)  times daily.    . cholecalciferol (VITAMIN D) 1000 units tablet Take 1,000 Units by mouth daily.    Marland Kitchen diltiazem (CARTIA XT) 180 MG 24 hr capsule Take 1 capsule (180 mg total) by mouth 2 (two) times daily. Please keep upcoming appt for future refills. Thank you 180 capsule 3  . FLUoxetine (PROZAC) 40 MG capsule Take 40 mg by mouth daily.      . furosemide (LASIX) 20 MG tablet Take 2 tablets (40 mg total) by mouth daily. 60 tablet 0  . ipratropium-albuterol (DUONEB) 0.5-2.5 (3) MG/3ML SOLN Take 3 mLs by nebulization 3 (three) times daily. 360 mL   . Multiple Vitamin  (THEREMS PO) Take 1 tablet by mouth daily.    . pantoprazole (PROTONIX) 40 MG tablet Take 40 mg by mouth daily.    . potassium chloride SA (K-DUR,KLOR-CON) 20 MEQ tablet Take 20 mEq by mouth 2 (two) times daily.     . risperiDONE (RISPERDAL) 0.25 MG tablet Take 0.25 mg by mouth 2 (two) times daily as needed.    . topiramate (TOPAMAX) 50 MG tablet Take 1 tablet (50 mg total) by mouth 2 (two) times daily. 180 tablet 4  . traZODone (DESYREL) 50 MG tablet Take 50 mg by mouth at bedtime as needed.     No current facility-administered medications for this visit.     Allergies:   Aricept [donepezil hcl]; Ceclor [cefaclor]; Namenda [memantine hcl]; Penicillins; and Ramipril   Social History: Social History   Socioeconomic History  . Marital status: Married    Spouse name: [name]  . Number of children: 3  . Years of education: Not on file  . Highest education level: Not on file  Occupational History  . Occupation: RETIRED    Employer: RETIRED    Comment: [employer]  . Financial resource strain: Not on file  . Food insecurity:    Worry: Not on file    Inability: Not on file  . Transportation needs:    Medical: Not on file    Non-medical: Not on file  Tobacco Use  . Smoking status: Former Smoker    Packs/day: 2.00    Years: 48.00    Pack years: 96.00    Types: Cigarettes    Last attempt to quit: 03/09/1998    Years since quitting: 20.6  . Smokeless tobacco: Never Used  Substance and Sexual Activity  . Alcohol use: No    Comment: quit: 1999  . Drug use: No  . Sexual activity: Never  Lifestyle  . Physical activity:    Days per week: Not on file    Minutes per session: Not on file  . Stress: Not on file  Relationships  . Social connections:    Talks on phone: Not on file    Gets together: Not on file    Attends religious service: Not on file    Active member of club or organization: Not on file    Attends meetings of clubs or organizations: Not on file     Relationship status: Not on file  . Intimate partner violence:    Fear of current or ex partner: Not on file    Emotionally abused: Not on file    Physically abused: Not on file    Forced sexual activity: Not on file  Other Topics Concern  . Not on file  Social History Narrative   Pt lives in [place] with spouse.  Retired.  Prior Biochemist, clinical for [employer].   Caffeine Use: once daily   Married 20 years    Family History: Family History  Problem Relation Age of Onset  . Cancer Mother        ovarian  . Ovarian cancer Mother   . Cancer Father        pancreatic  . Pancreatic cancer Father   . Cancer Brother        lung  . Ovarian cancer Other   . Pancreatic cancer Other   . Lung cancer Other      Review of Systems: All other systems reviewed and are otherwise negative except as noted above.   Physical Exam: VS:  BP 110/64   Pulse 86   Ht 6' (1.829 m)   Wt 188 lb (85.3 kg)   SpO2 97%   BMI 25.50 kg/m  , BMI Body mass index is 25.5 kg/m.  GEN- The patient is elderly appearing, alert today HEENT: normocephalic, atraumatic; sclera clear, conjunctiva pink; hearing intact; oropharynx clear; neck supple  Lungs- Clear to ausculation bilaterally, normal work of breathing.  No wheezes, rales, rhonchi Heart- Irregular rate and rhythm  GI- soft, non-tender, non-distended, bowel sounds present  Extremities- no clubbing, cyanosis, or edema  MS- no significant deformity or atrophy Skin- warm and dry, no rash or lesion; PPM pocket well healed Psych- euthymic mood, full affect Neuro- strength and sensation are intact  PPM Interrogation- reviewed in detail today,  See PACEART report  EKG:  EKG is not ordered today.  Recent Labs: 05/29/2018: ALT 16; Hemoglobin 11.8; Platelets 188 05/30/2018: BUN 16; Creatinine, Ser 0.85; Magnesium 2.1; Potassium 3.7; Sodium 145   Wt Readings from Last 3 Encounters:  10/27/18 188 lb (85.3 kg)  05/28/18 201 lb 11.5 oz (91.5 kg)    12/09/17 201 lb 12.8 oz (91.5 kg)     Other studies Reviewed: Additional studies/ records that were reviewed today include: Dr Rayann Heman and Dr Marlou Porch' office notes  Assessment and Plan:  1.   Permanent atrial fibrillation Normal PPM function See Pace Art report No changes today Continue Eliquis for CHADS2VASC of 6 CBC, BMET followed by PCP  2.  HTN Stable No change required today  3.  Chronic diastolic heart failure Stable No change required today    Current medicines are reviewed at length with the patient today.   The patient does not have concerns regarding his medicines.  The following changes were made today:  none  Labs/ tests ordered today include:   Orders Placed This Encounter  Procedures  . CUP PACEART INCLINIC DEVICE CHECK     Disposition:   Follow up with [facility], me in 1 year    Signed, Chanetta Marshall, NP 10/27/2018 11:44 AM  [address] [place] [address] [phone] (office) [phone] (fax)

## 2018-10-27 ENCOUNTER — Ambulatory Visit (INDEPENDENT_AMBULATORY_CARE_PROVIDER_SITE_OTHER): Payer: Medicare Other | Admitting: Nurse Practitioner

## 2018-10-27 ENCOUNTER — Encounter: Payer: Medicare Other | Admitting: Nurse Practitioner

## 2018-10-27 ENCOUNTER — Encounter: Payer: Self-pay | Admitting: Nurse Practitioner

## 2018-10-27 VITALS — BP 110/64 | HR 86 | Ht 72.0 in | Wt 188.0 lb

## 2018-10-27 DIAGNOSIS — I4821 Permanent atrial fibrillation: Secondary | ICD-10-CM

## 2018-10-27 DIAGNOSIS — I10 Essential (primary) hypertension: Secondary | ICD-10-CM

## 2018-10-27 LAB — CUP PACEART INCLINIC DEVICE CHECK
Date Time Interrogation Session: 20200116110907
Implantable Lead Implant Date: 20120609
Implantable Lead Location: 753860
Implantable Lead Model: 5076
Implantable Lead Model: 5076
MDC IDC LEAD IMPLANT DT: 20120609
MDC IDC LEAD LOCATION: 753859
MDC IDC PG IMPLANT DT: 20120609

## 2018-10-27 NOTE — Patient Instructions (Signed)
Medication Instructions:  NONE If you need a refill on your cardiac medications before your next appointment, please call your pharmacy.   Lab work: NONE If you have labs (blood work) drawn today and your tests are completely normal, you will receive your results only by: Marland Kitchen MyChart Message (if you have MyChart) OR . A paper copy in the mail If you have any lab test that is abnormal or we need to change your treatment, we will call you to review the results.  Testing/Procedures: NONE  Follow-Up: At [facility], you and your health needs are our priority.  As part of our continuing mission to provide you with exceptional heart care, we have created designated Provider Care Teams.  These Care Teams include your primary Cardiologist (physician) and Advanced Practice Providers (APPs -  Physician Assistants and Nurse Practitioners) who all work together to provide you with the care you need, when you need it. You will need a follow up appointment in 1 years.  Please call our office 2 months in advance to schedule this appointment.  You may see Dr Rayann Heman or one of the following Advanced Practice Providers on your designated Care Team:   Chanetta Marshall, NP . Tommye Standard, PA-C  Any Other Special Instructions Will Be Listed Below (If Applicable). Remote monitoring is used to monitor your Pacemaker  from home. This monitoring reduces the number of office visits required to check your device to one time per year. It allows [place] to keep an eye on the functioning of your device to ensure it is working properly. You are scheduled for a device check from home on 11/07/2018. You may send your transmission at any time that day. If you have a wireless device, the transmission will be sent automatically. After your physician reviews your transmission, you will receive a postcard with your next transmission date.

## 2018-10-28 DIAGNOSIS — F05 Delirium due to known physiological condition: Secondary | ICD-10-CM | POA: Diagnosis not present

## 2018-10-28 DIAGNOSIS — F1011 Alcohol abuse, in remission: Secondary | ICD-10-CM | POA: Diagnosis not present

## 2018-10-28 DIAGNOSIS — F0391 Unspecified dementia with behavioral disturbance: Secondary | ICD-10-CM | POA: Diagnosis not present

## 2018-11-03 DIAGNOSIS — D518 Other vitamin B12 deficiency anemias: Secondary | ICD-10-CM | POA: Diagnosis not present

## 2018-11-03 DIAGNOSIS — Z79899 Other long term (current) drug therapy: Secondary | ICD-10-CM | POA: Diagnosis not present

## 2018-11-03 DIAGNOSIS — E559 Vitamin D deficiency, unspecified: Secondary | ICD-10-CM | POA: Diagnosis not present

## 2018-11-03 DIAGNOSIS — E7849 Other hyperlipidemia: Secondary | ICD-10-CM | POA: Diagnosis not present

## 2018-11-03 DIAGNOSIS — E119 Type 2 diabetes mellitus without complications: Secondary | ICD-10-CM | POA: Diagnosis not present

## 2018-11-03 DIAGNOSIS — E038 Other specified hypothyroidism: Secondary | ICD-10-CM | POA: Diagnosis not present

## 2018-11-07 ENCOUNTER — Ambulatory Visit (INDEPENDENT_AMBULATORY_CARE_PROVIDER_SITE_OTHER): Payer: Medicare Other

## 2018-11-07 DIAGNOSIS — I495 Sick sinus syndrome: Secondary | ICD-10-CM | POA: Diagnosis not present

## 2018-11-08 DIAGNOSIS — E038 Other specified hypothyroidism: Secondary | ICD-10-CM | POA: Diagnosis not present

## 2018-11-08 DIAGNOSIS — D518 Other vitamin B12 deficiency anemias: Secondary | ICD-10-CM | POA: Diagnosis not present

## 2018-11-08 DIAGNOSIS — E119 Type 2 diabetes mellitus without complications: Secondary | ICD-10-CM | POA: Diagnosis not present

## 2018-11-08 LAB — CUP PACEART REMOTE DEVICE CHECK
Battery Remaining Longevity: 95 mo
Brady Statistic RV Percent Paced: 11 %
Implantable Lead Implant Date: 20120609
Implantable Lead Location: 753859
Implantable Lead Model: 5076
Implantable Pulse Generator Implant Date: 20120609
Lead Channel Impedance Value: 413 Ohm
Lead Channel Impedance Value: 67 Ohm
Lead Channel Setting Pacing Pulse Width: 0.4 ms
Lead Channel Setting Sensing Sensitivity: 2.8 mV
MDC IDC LEAD IMPLANT DT: 20120609
MDC IDC LEAD LOCATION: 753860
MDC IDC MSMT BATTERY IMPEDANCE: 599 Ohm
MDC IDC MSMT BATTERY VOLTAGE: 2.78 V
MDC IDC MSMT LEADCHNL RV PACING THRESHOLD AMPLITUDE: 0.875 V
MDC IDC MSMT LEADCHNL RV PACING THRESHOLD PULSEWIDTH: 0.4 ms
MDC IDC SESS DTM: 20200127202626
MDC IDC SET LEADCHNL RV PACING AMPLITUDE: 2.5 V

## 2018-11-08 NOTE — Progress Notes (Signed)
Remote pacemaker transmission.   

## 2018-11-10 DIAGNOSIS — F419 Anxiety disorder, unspecified: Secondary | ICD-10-CM | POA: Diagnosis not present

## 2018-11-17 DIAGNOSIS — D485 Neoplasm of uncertain behavior of skin: Secondary | ICD-10-CM | POA: Diagnosis not present

## 2018-11-17 DIAGNOSIS — L570 Actinic keratosis: Secondary | ICD-10-CM | POA: Diagnosis not present

## 2018-11-17 DIAGNOSIS — C44319 Basal cell carcinoma of skin of other parts of face: Secondary | ICD-10-CM | POA: Diagnosis not present

## 2018-11-17 DIAGNOSIS — D0422 Carcinoma in situ of skin of left ear and external auricular canal: Secondary | ICD-10-CM | POA: Diagnosis not present

## 2018-11-24 DIAGNOSIS — F05 Delirium due to known physiological condition: Secondary | ICD-10-CM | POA: Diagnosis not present

## 2018-11-24 DIAGNOSIS — J449 Chronic obstructive pulmonary disease, unspecified: Secondary | ICD-10-CM | POA: Diagnosis not present

## 2018-11-24 DIAGNOSIS — F1011 Alcohol abuse, in remission: Secondary | ICD-10-CM | POA: Diagnosis not present

## 2018-11-24 DIAGNOSIS — I10 Essential (primary) hypertension: Secondary | ICD-10-CM | POA: Diagnosis not present

## 2018-11-24 DIAGNOSIS — C44229 Squamous cell carcinoma of skin of left ear and external auricular canal: Secondary | ICD-10-CM | POA: Diagnosis not present

## 2018-11-24 DIAGNOSIS — F0391 Unspecified dementia with behavioral disturbance: Secondary | ICD-10-CM | POA: Diagnosis not present

## 2018-11-24 DIAGNOSIS — F0281 Dementia in other diseases classified elsewhere with behavioral disturbance: Secondary | ICD-10-CM | POA: Diagnosis not present

## 2018-11-24 DIAGNOSIS — I5032 Chronic diastolic (congestive) heart failure: Secondary | ICD-10-CM | POA: Diagnosis not present

## 2018-11-24 DIAGNOSIS — C44319 Basal cell carcinoma of skin of other parts of face: Secondary | ICD-10-CM | POA: Diagnosis not present

## 2018-12-08 DIAGNOSIS — F330 Major depressive disorder, recurrent, mild: Secondary | ICD-10-CM | POA: Diagnosis not present

## 2018-12-08 DIAGNOSIS — D518 Other vitamin B12 deficiency anemias: Secondary | ICD-10-CM | POA: Diagnosis not present

## 2018-12-08 DIAGNOSIS — E119 Type 2 diabetes mellitus without complications: Secondary | ICD-10-CM | POA: Diagnosis not present

## 2018-12-08 DIAGNOSIS — F419 Anxiety disorder, unspecified: Secondary | ICD-10-CM | POA: Diagnosis not present

## 2018-12-08 DIAGNOSIS — E038 Other specified hypothyroidism: Secondary | ICD-10-CM | POA: Diagnosis not present

## 2018-12-12 ENCOUNTER — Encounter: Payer: Self-pay | Admitting: Cardiology

## 2018-12-12 ENCOUNTER — Ambulatory Visit (INDEPENDENT_AMBULATORY_CARE_PROVIDER_SITE_OTHER): Payer: Medicare Other | Admitting: Cardiology

## 2018-12-12 VITALS — BP 110/50 | HR 86 | Ht 72.0 in | Wt 169.8 lb

## 2018-12-12 DIAGNOSIS — I4821 Permanent atrial fibrillation: Secondary | ICD-10-CM

## 2018-12-12 DIAGNOSIS — Z950 Presence of cardiac pacemaker: Secondary | ICD-10-CM

## 2018-12-12 DIAGNOSIS — I495 Sick sinus syndrome: Secondary | ICD-10-CM | POA: Diagnosis not present

## 2018-12-12 NOTE — Progress Notes (Signed)
[place]. [address] [place], [place]  [address] Phone: [phone]  Fax:  [phone]  Date:  12/12/2018   ID:  Zachary Warren, DOB 13-Jul-1933, MRN [id number]  PCP:  Zachary Doe, NP   History of Present Illness: Zachary Warren is a 85 y.o. male with a hx of coronary artery disease status post bypass in 2003,and permanent atrial fibrillation cardioversion, x 3, failed amiodarone secondary to return of atrial fibrillation despite maintenance therapy, discontinuation of amiodarone. He's currently on anticoagulation with Eliquis. He had pacemaker placed by Dr. Rayann Heman 6/12.   His pacemaker is functioning well.  Heart rates were noted to be greater than 100.  His diltiazem was increased at previous visit.  He has been diagnosed with vascular dementia.  He is now at behavioral facility.   Wife goes to [facility] support group. Wife is an Training and development officer. Used to be a Chartered certified accountant. She won gold metal quilting   12/12/2018-overall has lost some weight but he is doing well.  He is not having any chest pain fevers chills nausea vomiting syncope.  His right greater than left lower extremity edema is chronic.  No major changes.  Wife with him today.  He is living in a group facility that is been beneficial for him.  No bleeding.  Cost of Eliquis was discussed.   Wt Readings from Last 3 Encounters:  12/12/18 169 lb 12.8 oz (77 kg)  10/27/18 188 lb (85.3 kg)  05/28/18 201 lb 11.5 oz (91.5 kg)     Past Medical History:  Diagnosis Date  . COPD (chronic obstructive pulmonary disease) ([place])   . Coronary artery disease    s/p 5v CABG 2002  . CVA (cerebral infarction) 1999  . DDD (degenerative disc disease), cervical   . Dementia ([place])   . Depression   . GERD (gastroesophageal reflux disease)   . History of kidney stones   . Hyperlipidemia   . Hypertension   . Memory impairment   . OSA (obstructive sleep apnea)    Recently diagnosed though pt. has not had CPAP trial  . Permanent  atrial fibrillation    chads2vasc score is 6  . Pneumonia    recent hospital admission for pneumonia   . Prostate cancer ([place]) 2006   s/p XRT  . PVD (peripheral vascular disease) ([place])   . Seizures ([place])   . Tachycardia-bradycardia [facility])    s/p PPM by [facility] 6/12    Past Surgical History:  Procedure Laterality Date  . CARDIOVERSION N/A 08/21/2013   Procedure: CARDIOVERSION;  Surgeon: Candee Furbish, MD;  Location: [place];  Service: Cardiovascular;  Laterality: N/A;  . [place]  . CORONARY ARTERY BYPASS GRAFT  2002  . CYSTOSCOPY W/ URETERAL STENT PLACEMENT Left 05/13/2017   Procedure: CYSTOSCOPY WITH RETROGRADE PYELOGRAM/URETERAL STENT PLACEMENT LEFT;  Surgeon: Alexis Frock, MD;  [facility];  Service: Urology;  Laterality: Left;  . CYSTOSCOPY WITH RETROGRADE PYELOGRAM, URETEROSCOPY AND STENT PLACEMENT Left 06/09/2017   Procedure: CYSTOSCOPY WITH RETROGRADE PYELOGRAM, URETEROSCOPY;  Surgeon: Alexis Frock, MD;  [facility];  Service: Urology;  Laterality: Left;  . HOLMIUM LASER APPLICATION Left 6/80/3212   Procedure: HOLMIUM LASER APPLICATION;  Surgeon: Alexis Frock, MD;  [facility];  Service: Urology;  Laterality: Left;  . INSERT / REPLACE / REMOVE PACEMAKER    . NECK SURGERY     hx of   . PACEMAKER INSERTION     implanted by [facility] 6/12  Current Outpatient Medications  Medication Sig Dispense Refill  . acetaminophen (TYLENOL) 325 MG tablet Take 500 mg by mouth every 6 (six) hours as needed.    Marland Kitchen apixaban (ELIQUIS) 5 MG TABS tablet Take 1 tablet (5 mg total) by mouth 2 (two) times daily. 60 tablet 11  . atorvastatin (LIPITOR) 40 MG tablet TAKE 1 TABLET BY MOUTH EVERY DAY - PATIENT DUE FOR LIPID PANEL 90 tablet 1  . budesonide-formoterol (SYMBICORT) 80-4.5 MCG/ACT inhaler Inhale 2 puffs into the lungs daily.    Marland Kitchen buPROPion (WELLBUTRIN) 75 MG tablet Take 75 mg by mouth 2 (two) times daily.    . calcium carbonate (TUMS - DOSED IN MG ELEMENTAL CALCIUM) 500 MG  chewable tablet Chew 1 tablet by mouth 2 (two) times daily.    . cholecalciferol (VITAMIN D) 1000 units tablet Take 1,000 Units by mouth daily.    Marland Kitchen diltiazem (CARTIA XT) 180 MG 24 hr capsule Take 1 capsule (180 mg total) by mouth 2 (two) times daily. Please keep upcoming appt for future refills. Thank you 180 capsule 3  . FLUoxetine (PROZAC) 40 MG capsule Take 40 mg by mouth daily.      . furosemide (LASIX) 20 MG tablet Take 2 tablets (40 mg total) by mouth daily. 60 tablet 0  . ipratropium-albuterol (DUONEB) 0.5-2.5 (3) MG/3ML SOLN Take 3 mLs by nebulization 3 (three) times daily. 360 mL   . Multiple Vitamin (THEREMS PO) Take 1 tablet by mouth daily.    . pantoprazole (PROTONIX) 40 MG tablet Take 40 mg by mouth daily.    . potassium chloride SA (K-DUR,KLOR-CON) 20 MEQ tablet Take 20 mEq by mouth 2 (two) times daily.     . risperiDONE (RISPERDAL) 0.25 MG tablet Take 0.25 mg by mouth 2 (two) times daily as needed.    . topiramate (TOPAMAX) 50 MG tablet Take 1 tablet (50 mg total) by mouth 2 (two) times daily. 180 tablet 4  . traZODone (DESYREL) 50 MG tablet Take 50 mg by mouth at bedtime as needed.     No current facility-administered medications for this visit.     Allergies:    Allergies  Allergen Reactions  . Aricept [Donepezil Hcl] Other (See Comments)    "stomach pain", generic  . Ceclor [Cefaclor]   . Namenda [Memantine Hcl] Other (See Comments)    "dizzy", brand medication  . Penicillins     Has patient had a PCN reaction causing immediate rash, facial/tongue/throat swelling, SOB or lightheadedness with hypotension: Unknown Has patient had a PCN reaction causing severe rash involving mucus membranes or skin necrosis: Unknown Has patient had a PCN reaction that required hospitalization: Unknown Has patient had a PCN reaction occurring within the last 10 years: Unknown If all of the above answers are "NO", then may proceed with Cephalosporin use.   . Ramipril Other (See Comments)       cough    Social History:  The patient  reports that he quit smoking about 20 years ago. His smoking use included cigarettes. He has a 96.00 pack-year smoking history. He has never used smokeless tobacco. He reports that he does not drink alcohol or use drugs.   ROS:  Please see the history of present illness.  No syncope, no orthopnea PND PHYSICAL EXAM: VS:  BP (!) 110/50   Pulse 86   Ht 6' (1.829 m)   Wt 169 lb 12.8 oz (77 kg)   SpO2 97%   BMI 23.03 kg/m  GEN: Well nourished, well  developed, in no acute distress, in wheelchair but he was able to ambulate to the bathroom HEENT: normal  Neck: no JVD, carotid bruits, or masses Cardiac: irreg; no murmurs, rubs, or gallops,no edema  Respiratory:  clear to auscultation bilaterally, normal work of breathing GI: soft, nontender, nondistended, + BS MS: no deformity or atrophy  Skin: warm and dry, no rash Neuro:  Alert and Oriented x 3, Strength and sensation are intact Psych: euthymic mood, full affect    EKG: 08/10/17-1 heart rate 80s, atrial fibrillation personally viewed-prior /5/15-heart rate 103, atrial fibrillation     ECHO: 07/12/17: - Left ventricle: The cavity size was normal. There was mild   concentric hypertrophy. Systolic function was normal. The   estimated ejection fraction was in the range of 55% to 60%. Wall   motion was normal; there were no regional wall motion   abnormalities. The study was not technically sufficient to allow   evaluation of LV diastolic dysfunction due to atrial   fibrillation. - Ventricular septum: The contour showed systolic flattening. These   changes are consistent with RV pressure overload. - Aortic valve: Mildly to moderately calcified annulus. Trileaflet. - Mitral valve: There was mild regurgitation. - Left atrium: The atrium was severely dilated. - Right ventricle: Pacer wire or catheter noted in right ventricle.   Systolic function was mildly reduced. - Atrial septum: No defect or  patent foramen ovale was identified. - Tricuspid valve: There was mild regurgitation. - Pulmonary arteries: Systolic pressure was moderately increased.   PA peak pressure: 55 mm Hg (S). - Inferior vena cava: The vessel was dilated. The respirophasic   diameter changes were blunted (< 50%), consistent with elevated   central venous pressure. Estimated right atrial pressure: 15   mmHg. - Pericardium, extracardiac: There was a left pleural effusion.   ASSESSMENT AND PLAN:  1. Atrial fibrillation-permanent atrial fibrillation. Rate control has been somewhat challenging off of amiodarone but is adequate now with diltiazem XT 180 mg twice a day.  Doing very well.  He did not feel any different after cardioversion in 11/14. Took him off of antiarrhythmic amiodarone because of persistent A. fib. Hence, we stopped his amiodarone in December of 2015.  Pacemaker in place for tachybradycardia syndrome.  No changes made.  CHADSVASc 6, Eliquis 2. Chronic anticoagulation-  Eliquis.   No bleeding.  Doing well again.  Cost. 3. CAD - stable,  post CABG in 2002 - right LE vein extraction.  No anginal symptoms currently. 4. Hyperlipidemia - LDL goal 70.  LDL checked on 02/25/17 5. Tachycardia/bradycardia syndrome-pacemaker functioning well.  Dr. Jackalyn Lombard note reviewed. 6. Secondary pulmonary hypertension-from left heart disease, continue to treat Lasix. 7. Weight loss-they are monitoring this.  He is within the normal range currently. 8. Upper airway congestion/mucous production.  This seems to be much better. 9. Lower extremity edema- stable on current Lasix 40 mg a day. 10. We'll see him back in 12 months.  Signed,         Candee Furbish, MD [facility]  12/12/2018 5:17 PM

## 2018-12-12 NOTE — Patient Instructions (Signed)
Medication Instructions:  The current medical regimen is effective;  continue present plan and medications.  If you need a refill on your cardiac medications before your next appointment, please call your pharmacy.   Follow-Up: At [facility], you and your health needs are our priority.  As part of our continuing mission to provide you with exceptional heart care, we have created designated Provider Care Teams.  These Care Teams include your primary Cardiologist (physician) and Advanced Practice Providers (APPs -  Physician Assistants and Nurse Practitioners) who all work together to provide you with the care you need, when you need it. You will need a follow up appointment in 12 years.  Please call our office 2 months in advance to schedule this appointment.  You may see Candee Furbish, MD or one of the following Advanced Practice Providers on your designated Care Team:   Truitt Merle, NP Cecilie Kicks, NP . Kathyrn Drown, NP  Thank you for choosing [facility]!!

## 2018-12-20 DIAGNOSIS — R269 Unspecified abnormalities of gait and mobility: Secondary | ICD-10-CM | POA: Diagnosis not present

## 2018-12-20 DIAGNOSIS — F0150 Vascular dementia without behavioral disturbance: Secondary | ICD-10-CM | POA: Diagnosis not present

## 2018-12-22 DIAGNOSIS — F0150 Vascular dementia without behavioral disturbance: Secondary | ICD-10-CM | POA: Diagnosis not present

## 2018-12-22 DIAGNOSIS — J449 Chronic obstructive pulmonary disease, unspecified: Secondary | ICD-10-CM | POA: Diagnosis not present

## 2018-12-22 DIAGNOSIS — I4811 Longstanding persistent atrial fibrillation: Secondary | ICD-10-CM | POA: Diagnosis not present

## 2018-12-22 DIAGNOSIS — I5032 Chronic diastolic (congestive) heart failure: Secondary | ICD-10-CM | POA: Diagnosis not present

## 2018-12-22 DIAGNOSIS — R269 Unspecified abnormalities of gait and mobility: Secondary | ICD-10-CM | POA: Diagnosis not present

## 2018-12-22 DIAGNOSIS — F0281 Dementia in other diseases classified elsewhere with behavioral disturbance: Secondary | ICD-10-CM | POA: Diagnosis not present

## 2019-01-04 DIAGNOSIS — E038 Other specified hypothyroidism: Secondary | ICD-10-CM | POA: Diagnosis not present

## 2019-01-04 DIAGNOSIS — D518 Other vitamin B12 deficiency anemias: Secondary | ICD-10-CM | POA: Diagnosis not present

## 2019-01-04 DIAGNOSIS — E119 Type 2 diabetes mellitus without complications: Secondary | ICD-10-CM | POA: Diagnosis not present

## 2019-01-09 DIAGNOSIS — F0391 Unspecified dementia with behavioral disturbance: Secondary | ICD-10-CM | POA: Diagnosis not present

## 2019-01-09 DIAGNOSIS — F1011 Alcohol abuse, in remission: Secondary | ICD-10-CM | POA: Diagnosis not present

## 2019-01-09 DIAGNOSIS — F05 Delirium due to known physiological condition: Secondary | ICD-10-CM | POA: Diagnosis not present

## 2019-01-10 DIAGNOSIS — F330 Major depressive disorder, recurrent, mild: Secondary | ICD-10-CM | POA: Diagnosis not present

## 2019-01-10 DIAGNOSIS — F419 Anxiety disorder, unspecified: Secondary | ICD-10-CM | POA: Diagnosis not present

## 2019-01-13 DIAGNOSIS — R05 Cough: Secondary | ICD-10-CM | POA: Diagnosis not present

## 2019-01-19 DIAGNOSIS — Z79899 Other long term (current) drug therapy: Secondary | ICD-10-CM | POA: Diagnosis not present

## 2019-01-19 DIAGNOSIS — I5032 Chronic diastolic (congestive) heart failure: Secondary | ICD-10-CM | POA: Diagnosis not present

## 2019-01-19 DIAGNOSIS — E7849 Other hyperlipidemia: Secondary | ICD-10-CM | POA: Diagnosis not present

## 2019-01-19 DIAGNOSIS — J449 Chronic obstructive pulmonary disease, unspecified: Secondary | ICD-10-CM | POA: Diagnosis not present

## 2019-01-19 DIAGNOSIS — J189 Pneumonia, unspecified organism: Secondary | ICD-10-CM | POA: Diagnosis not present

## 2019-01-19 DIAGNOSIS — E119 Type 2 diabetes mellitus without complications: Secondary | ICD-10-CM | POA: Diagnosis not present

## 2019-01-19 DIAGNOSIS — D518 Other vitamin B12 deficiency anemias: Secondary | ICD-10-CM | POA: Diagnosis not present

## 2019-01-19 DIAGNOSIS — I4819 Other persistent atrial fibrillation: Secondary | ICD-10-CM | POA: Diagnosis not present

## 2019-01-24 DIAGNOSIS — F1011 Alcohol abuse, in remission: Secondary | ICD-10-CM | POA: Diagnosis not present

## 2019-01-24 DIAGNOSIS — F0391 Unspecified dementia with behavioral disturbance: Secondary | ICD-10-CM | POA: Diagnosis not present

## 2019-01-24 DIAGNOSIS — F05 Delirium due to known physiological condition: Secondary | ICD-10-CM | POA: Diagnosis not present

## 2019-01-26 DIAGNOSIS — D518 Other vitamin B12 deficiency anemias: Secondary | ICD-10-CM | POA: Diagnosis not present

## 2019-01-26 DIAGNOSIS — E559 Vitamin D deficiency, unspecified: Secondary | ICD-10-CM | POA: Diagnosis not present

## 2019-01-26 DIAGNOSIS — E038 Other specified hypothyroidism: Secondary | ICD-10-CM | POA: Diagnosis not present

## 2019-01-26 DIAGNOSIS — E7849 Other hyperlipidemia: Secondary | ICD-10-CM | POA: Diagnosis not present

## 2019-01-26 DIAGNOSIS — Z79899 Other long term (current) drug therapy: Secondary | ICD-10-CM | POA: Diagnosis not present

## 2019-01-26 DIAGNOSIS — E119 Type 2 diabetes mellitus without complications: Secondary | ICD-10-CM | POA: Diagnosis not present

## 2019-01-27 DIAGNOSIS — R05 Cough: Secondary | ICD-10-CM | POA: Diagnosis not present

## 2019-02-01 ENCOUNTER — Telehealth: Payer: Self-pay | Admitting: Cardiology

## 2019-02-01 NOTE — Telephone Encounter (Signed)
New Message:   Please call, concerning his appt on  02-06-19.

## 2019-02-01 NOTE — Telephone Encounter (Signed)
Patient wife is going to take the monitor to the facility to the monitor and give to the staff to complete remote transmission on 02/06/2019. Instructed pt wife to clean monitor before taking to facility.

## 2019-02-02 DIAGNOSIS — J90 Pleural effusion, not elsewhere classified: Secondary | ICD-10-CM | POA: Diagnosis not present

## 2019-02-02 DIAGNOSIS — J189 Pneumonia, unspecified organism: Secondary | ICD-10-CM | POA: Diagnosis not present

## 2019-02-02 DIAGNOSIS — I739 Peripheral vascular disease, unspecified: Secondary | ICD-10-CM | POA: Diagnosis not present

## 2019-02-02 DIAGNOSIS — E870 Hyperosmolality and hypernatremia: Secondary | ICD-10-CM | POA: Diagnosis not present

## 2019-02-03 DIAGNOSIS — M25551 Pain in right hip: Secondary | ICD-10-CM | POA: Diagnosis not present

## 2019-02-03 DIAGNOSIS — M79651 Pain in right thigh: Secondary | ICD-10-CM | POA: Diagnosis not present

## 2019-02-06 ENCOUNTER — Other Ambulatory Visit: Payer: Self-pay

## 2019-02-06 ENCOUNTER — Ambulatory Visit (INDEPENDENT_AMBULATORY_CARE_PROVIDER_SITE_OTHER): Payer: Medicare Other | Admitting: *Deleted

## 2019-02-06 DIAGNOSIS — I4821 Permanent atrial fibrillation: Secondary | ICD-10-CM

## 2019-02-06 DIAGNOSIS — I495 Sick sinus syndrome: Secondary | ICD-10-CM

## 2019-02-06 LAB — CUP PACEART REMOTE DEVICE CHECK
Battery Impedance: 700 Ohm
Battery Remaining Longevity: 89 mo
Battery Voltage: 2.78 V
Brady Statistic RV Percent Paced: 7 %
Date Time Interrogation Session: 20200427145344
Implantable Lead Implant Date: 20120609
Implantable Lead Implant Date: 20120609
Implantable Lead Location: 753859
Implantable Lead Location: 753860
Implantable Lead Model: 5076
Implantable Lead Model: 5076
Implantable Pulse Generator Implant Date: 20120609
Lead Channel Impedance Value: 416 Ohm
Lead Channel Impedance Value: 67 Ohm
Lead Channel Pacing Threshold Amplitude: 0.875 V
Lead Channel Pacing Threshold Pulse Width: 0.4 ms
Lead Channel Setting Pacing Amplitude: 2.5 V
Lead Channel Setting Pacing Pulse Width: 0.4 ms
Lead Channel Setting Sensing Sensitivity: 2.8 mV

## 2019-02-07 DIAGNOSIS — E038 Other specified hypothyroidism: Secondary | ICD-10-CM | POA: Diagnosis not present

## 2019-02-07 DIAGNOSIS — E119 Type 2 diabetes mellitus without complications: Secondary | ICD-10-CM | POA: Diagnosis not present

## 2019-02-07 DIAGNOSIS — D518 Other vitamin B12 deficiency anemias: Secondary | ICD-10-CM | POA: Diagnosis not present

## 2019-02-08 DIAGNOSIS — E7849 Other hyperlipidemia: Secondary | ICD-10-CM | POA: Diagnosis not present

## 2019-02-08 DIAGNOSIS — Z79899 Other long term (current) drug therapy: Secondary | ICD-10-CM | POA: Diagnosis not present

## 2019-02-08 DIAGNOSIS — E119 Type 2 diabetes mellitus without complications: Secondary | ICD-10-CM | POA: Diagnosis not present

## 2019-02-08 DIAGNOSIS — D518 Other vitamin B12 deficiency anemias: Secondary | ICD-10-CM | POA: Diagnosis not present

## 2019-02-09 DIAGNOSIS — R05 Cough: Secondary | ICD-10-CM | POA: Diagnosis not present

## 2019-02-09 DIAGNOSIS — F419 Anxiety disorder, unspecified: Secondary | ICD-10-CM | POA: Diagnosis not present

## 2019-02-09 DIAGNOSIS — F330 Major depressive disorder, recurrent, mild: Secondary | ICD-10-CM | POA: Diagnosis not present

## 2019-02-13 NOTE — Progress Notes (Signed)
Remote pacemaker transmission.   

## 2019-02-16 DIAGNOSIS — I5033 Acute on chronic diastolic (congestive) heart failure: Secondary | ICD-10-CM | POA: Diagnosis not present

## 2019-02-16 DIAGNOSIS — I10 Essential (primary) hypertension: Secondary | ICD-10-CM | POA: Diagnosis not present

## 2019-02-16 DIAGNOSIS — J189 Pneumonia, unspecified organism: Secondary | ICD-10-CM | POA: Diagnosis not present

## 2019-02-16 DIAGNOSIS — J449 Chronic obstructive pulmonary disease, unspecified: Secondary | ICD-10-CM | POA: Diagnosis not present

## 2019-02-17 DIAGNOSIS — D518 Other vitamin B12 deficiency anemias: Secondary | ICD-10-CM | POA: Diagnosis not present

## 2019-02-17 DIAGNOSIS — F0391 Unspecified dementia with behavioral disturbance: Secondary | ICD-10-CM | POA: Diagnosis not present

## 2019-02-17 DIAGNOSIS — E119 Type 2 diabetes mellitus without complications: Secondary | ICD-10-CM | POA: Diagnosis not present

## 2019-02-17 DIAGNOSIS — E038 Other specified hypothyroidism: Secondary | ICD-10-CM | POA: Diagnosis not present

## 2019-02-17 DIAGNOSIS — F05 Delirium due to known physiological condition: Secondary | ICD-10-CM | POA: Diagnosis not present

## 2019-02-17 DIAGNOSIS — F1011 Alcohol abuse, in remission: Secondary | ICD-10-CM | POA: Diagnosis not present

## 2019-02-23 DIAGNOSIS — J189 Pneumonia, unspecified organism: Secondary | ICD-10-CM | POA: Diagnosis not present

## 2019-02-23 DIAGNOSIS — I5033 Acute on chronic diastolic (congestive) heart failure: Secondary | ICD-10-CM | POA: Diagnosis not present

## 2019-02-23 DIAGNOSIS — I4811 Longstanding persistent atrial fibrillation: Secondary | ICD-10-CM | POA: Diagnosis not present

## 2019-02-27 DIAGNOSIS — F330 Major depressive disorder, recurrent, mild: Secondary | ICD-10-CM | POA: Diagnosis not present

## 2019-02-27 DIAGNOSIS — F419 Anxiety disorder, unspecified: Secondary | ICD-10-CM | POA: Diagnosis not present

## 2019-03-02 DIAGNOSIS — I4811 Longstanding persistent atrial fibrillation: Secondary | ICD-10-CM | POA: Diagnosis not present

## 2019-03-02 DIAGNOSIS — J189 Pneumonia, unspecified organism: Secondary | ICD-10-CM | POA: Diagnosis not present

## 2019-03-02 DIAGNOSIS — I5032 Chronic diastolic (congestive) heart failure: Secondary | ICD-10-CM | POA: Diagnosis not present

## 2019-03-02 DIAGNOSIS — J449 Chronic obstructive pulmonary disease, unspecified: Secondary | ICD-10-CM | POA: Diagnosis not present

## 2019-03-15 DIAGNOSIS — F0391 Unspecified dementia with behavioral disturbance: Secondary | ICD-10-CM | POA: Diagnosis not present

## 2019-03-15 DIAGNOSIS — F1011 Alcohol abuse, in remission: Secondary | ICD-10-CM | POA: Diagnosis not present

## 2019-03-15 DIAGNOSIS — F05 Delirium due to known physiological condition: Secondary | ICD-10-CM | POA: Diagnosis not present

## 2019-03-16 DIAGNOSIS — I4811 Longstanding persistent atrial fibrillation: Secondary | ICD-10-CM | POA: Diagnosis not present

## 2019-03-16 DIAGNOSIS — R062 Wheezing: Secondary | ICD-10-CM | POA: Diagnosis not present

## 2019-03-16 DIAGNOSIS — E440 Moderate protein-calorie malnutrition: Secondary | ICD-10-CM | POA: Diagnosis not present

## 2019-03-16 DIAGNOSIS — I5033 Acute on chronic diastolic (congestive) heart failure: Secondary | ICD-10-CM | POA: Diagnosis not present

## 2019-03-16 DIAGNOSIS — Z951 Presence of aortocoronary bypass graft: Secondary | ICD-10-CM | POA: Diagnosis not present

## 2019-03-16 DIAGNOSIS — R0602 Shortness of breath: Secondary | ICD-10-CM | POA: Diagnosis not present

## 2019-03-16 DIAGNOSIS — Z950 Presence of cardiac pacemaker: Secondary | ICD-10-CM | POA: Diagnosis not present

## 2019-03-16 DIAGNOSIS — J449 Chronic obstructive pulmonary disease, unspecified: Secondary | ICD-10-CM | POA: Diagnosis not present

## 2019-04-03 DIAGNOSIS — E119 Type 2 diabetes mellitus without complications: Secondary | ICD-10-CM | POA: Diagnosis not present

## 2019-04-03 DIAGNOSIS — D518 Other vitamin B12 deficiency anemias: Secondary | ICD-10-CM | POA: Diagnosis not present

## 2019-04-03 DIAGNOSIS — E038 Other specified hypothyroidism: Secondary | ICD-10-CM | POA: Diagnosis not present

## 2019-04-13 DIAGNOSIS — I4811 Longstanding persistent atrial fibrillation: Secondary | ICD-10-CM | POA: Diagnosis not present

## 2019-04-13 DIAGNOSIS — I5032 Chronic diastolic (congestive) heart failure: Secondary | ICD-10-CM | POA: Diagnosis not present

## 2019-04-13 DIAGNOSIS — I10 Essential (primary) hypertension: Secondary | ICD-10-CM | POA: Diagnosis not present

## 2019-04-13 DIAGNOSIS — J449 Chronic obstructive pulmonary disease, unspecified: Secondary | ICD-10-CM | POA: Diagnosis not present

## 2019-04-19 DIAGNOSIS — E119 Type 2 diabetes mellitus without complications: Secondary | ICD-10-CM | POA: Diagnosis not present

## 2019-04-19 DIAGNOSIS — E038 Other specified hypothyroidism: Secondary | ICD-10-CM | POA: Diagnosis not present

## 2019-04-19 DIAGNOSIS — D518 Other vitamin B12 deficiency anemias: Secondary | ICD-10-CM | POA: Diagnosis not present

## 2019-04-20 DIAGNOSIS — Z79899 Other long term (current) drug therapy: Secondary | ICD-10-CM | POA: Diagnosis not present

## 2019-04-20 DIAGNOSIS — E7849 Other hyperlipidemia: Secondary | ICD-10-CM | POA: Diagnosis not present

## 2019-04-20 DIAGNOSIS — E119 Type 2 diabetes mellitus without complications: Secondary | ICD-10-CM | POA: Diagnosis not present

## 2019-04-20 DIAGNOSIS — E038 Other specified hypothyroidism: Secondary | ICD-10-CM | POA: Diagnosis not present

## 2019-04-20 DIAGNOSIS — E559 Vitamin D deficiency, unspecified: Secondary | ICD-10-CM | POA: Diagnosis not present

## 2019-04-20 DIAGNOSIS — D518 Other vitamin B12 deficiency anemias: Secondary | ICD-10-CM | POA: Diagnosis not present

## 2019-04-24 DIAGNOSIS — F330 Major depressive disorder, recurrent, mild: Secondary | ICD-10-CM | POA: Diagnosis not present

## 2019-04-24 DIAGNOSIS — F419 Anxiety disorder, unspecified: Secondary | ICD-10-CM | POA: Diagnosis not present

## 2019-05-08 ENCOUNTER — Ambulatory Visit (INDEPENDENT_AMBULATORY_CARE_PROVIDER_SITE_OTHER): Payer: Medicare Other | Admitting: *Deleted

## 2019-05-08 DIAGNOSIS — I495 Sick sinus syndrome: Secondary | ICD-10-CM

## 2019-05-08 DIAGNOSIS — I4819 Other persistent atrial fibrillation: Secondary | ICD-10-CM

## 2019-05-08 LAB — CUP PACEART REMOTE DEVICE CHECK
Battery Impedance: 725 Ohm
Battery Remaining Longevity: 87 mo
Battery Voltage: 2.78 V
Brady Statistic RV Percent Paced: 7 %
Date Time Interrogation Session: 20200727144503
Implantable Lead Implant Date: 20120609
Implantable Lead Implant Date: 20120609
Implantable Lead Location: 753859
Implantable Lead Location: 753860
Implantable Lead Model: 5076
Implantable Lead Model: 5076
Implantable Pulse Generator Implant Date: 20120609
Lead Channel Impedance Value: 413 Ohm
Lead Channel Impedance Value: 67 Ohm
Lead Channel Pacing Threshold Amplitude: 0.75 V
Lead Channel Pacing Threshold Pulse Width: 0.4 ms
Lead Channel Setting Pacing Amplitude: 2.5 V
Lead Channel Setting Pacing Pulse Width: 0.4 ms
Lead Channel Setting Sensing Sensitivity: 2.8 mV

## 2019-05-11 DIAGNOSIS — F0391 Unspecified dementia with behavioral disturbance: Secondary | ICD-10-CM | POA: Diagnosis not present

## 2019-05-11 DIAGNOSIS — F05 Delirium due to known physiological condition: Secondary | ICD-10-CM | POA: Diagnosis not present

## 2019-05-11 DIAGNOSIS — F1011 Alcohol abuse, in remission: Secondary | ICD-10-CM | POA: Diagnosis not present

## 2019-05-12 DIAGNOSIS — B351 Tinea unguium: Secondary | ICD-10-CM | POA: Diagnosis not present

## 2019-05-12 DIAGNOSIS — I739 Peripheral vascular disease, unspecified: Secondary | ICD-10-CM | POA: Diagnosis not present

## 2019-05-12 DIAGNOSIS — L84 Corns and callosities: Secondary | ICD-10-CM | POA: Diagnosis not present

## 2019-05-12 DIAGNOSIS — M79675 Pain in left toe(s): Secondary | ICD-10-CM | POA: Diagnosis not present

## 2019-05-17 DIAGNOSIS — E038 Other specified hypothyroidism: Secondary | ICD-10-CM | POA: Diagnosis not present

## 2019-05-17 DIAGNOSIS — E119 Type 2 diabetes mellitus without complications: Secondary | ICD-10-CM | POA: Diagnosis not present

## 2019-05-17 DIAGNOSIS — D518 Other vitamin B12 deficiency anemias: Secondary | ICD-10-CM | POA: Diagnosis not present

## 2019-05-18 DIAGNOSIS — F0281 Dementia in other diseases classified elsewhere with behavioral disturbance: Secondary | ICD-10-CM | POA: Diagnosis not present

## 2019-05-18 DIAGNOSIS — I4811 Longstanding persistent atrial fibrillation: Secondary | ICD-10-CM | POA: Diagnosis not present

## 2019-05-18 DIAGNOSIS — I5032 Chronic diastolic (congestive) heart failure: Secondary | ICD-10-CM | POA: Diagnosis not present

## 2019-05-18 DIAGNOSIS — J449 Chronic obstructive pulmonary disease, unspecified: Secondary | ICD-10-CM | POA: Diagnosis not present

## 2019-05-23 NOTE — Progress Notes (Signed)
Remote pacemaker transmission.   

## 2019-05-24 DIAGNOSIS — F1011 Alcohol abuse, in remission: Secondary | ICD-10-CM | POA: Diagnosis not present

## 2019-05-24 DIAGNOSIS — F05 Delirium due to known physiological condition: Secondary | ICD-10-CM | POA: Diagnosis not present

## 2019-05-24 DIAGNOSIS — F0391 Unspecified dementia with behavioral disturbance: Secondary | ICD-10-CM | POA: Diagnosis not present

## 2019-05-31 DIAGNOSIS — F330 Major depressive disorder, recurrent, mild: Secondary | ICD-10-CM | POA: Diagnosis not present

## 2019-05-31 DIAGNOSIS — F419 Anxiety disorder, unspecified: Secondary | ICD-10-CM | POA: Diagnosis not present

## 2019-06-12 DIAGNOSIS — F419 Anxiety disorder, unspecified: Secondary | ICD-10-CM | POA: Diagnosis not present

## 2019-06-12 DIAGNOSIS — F330 Major depressive disorder, recurrent, mild: Secondary | ICD-10-CM | POA: Diagnosis not present

## 2019-06-15 DIAGNOSIS — E440 Moderate protein-calorie malnutrition: Secondary | ICD-10-CM | POA: Diagnosis not present

## 2019-06-15 DIAGNOSIS — I5032 Chronic diastolic (congestive) heart failure: Secondary | ICD-10-CM | POA: Diagnosis not present

## 2019-06-15 DIAGNOSIS — I4811 Longstanding persistent atrial fibrillation: Secondary | ICD-10-CM | POA: Diagnosis not present

## 2019-06-15 DIAGNOSIS — J449 Chronic obstructive pulmonary disease, unspecified: Secondary | ICD-10-CM | POA: Diagnosis not present

## 2019-06-21 DIAGNOSIS — D518 Other vitamin B12 deficiency anemias: Secondary | ICD-10-CM | POA: Diagnosis not present

## 2019-06-21 DIAGNOSIS — E038 Other specified hypothyroidism: Secondary | ICD-10-CM | POA: Diagnosis not present

## 2019-06-21 DIAGNOSIS — E119 Type 2 diabetes mellitus without complications: Secondary | ICD-10-CM | POA: Diagnosis not present

## 2019-06-23 DIAGNOSIS — F05 Delirium due to known physiological condition: Secondary | ICD-10-CM | POA: Diagnosis not present

## 2019-06-23 DIAGNOSIS — F1011 Alcohol abuse, in remission: Secondary | ICD-10-CM | POA: Diagnosis not present

## 2019-06-23 DIAGNOSIS — F0391 Unspecified dementia with behavioral disturbance: Secondary | ICD-10-CM | POA: Diagnosis not present

## 2019-06-26 DIAGNOSIS — F330 Major depressive disorder, recurrent, mild: Secondary | ICD-10-CM | POA: Diagnosis not present

## 2019-06-26 DIAGNOSIS — F419 Anxiety disorder, unspecified: Secondary | ICD-10-CM | POA: Diagnosis not present

## 2019-07-12 DIAGNOSIS — R52 Pain, unspecified: Secondary | ICD-10-CM | POA: Diagnosis not present

## 2019-07-12 DIAGNOSIS — W208XXA Other cause of strike by thrown, projected or falling object, initial encounter: Secondary | ICD-10-CM | POA: Diagnosis not present

## 2019-07-12 DIAGNOSIS — W19XXXA Unspecified fall, initial encounter: Secondary | ICD-10-CM | POA: Diagnosis not present

## 2019-07-12 DIAGNOSIS — R609 Edema, unspecified: Secondary | ICD-10-CM | POA: Diagnosis not present

## 2019-07-12 DIAGNOSIS — S9001XA Contusion of right ankle, initial encounter: Secondary | ICD-10-CM | POA: Diagnosis not present

## 2019-07-12 DIAGNOSIS — S9002XA Contusion of left ankle, initial encounter: Secondary | ICD-10-CM | POA: Diagnosis not present

## 2019-07-12 DIAGNOSIS — R404 Transient alteration of awareness: Secondary | ICD-10-CM | POA: Diagnosis not present

## 2019-07-12 DIAGNOSIS — Z880 Allergy status to penicillin: Secondary | ICD-10-CM | POA: Diagnosis not present

## 2019-07-12 DIAGNOSIS — M25571 Pain in right ankle and joints of right foot: Secondary | ICD-10-CM | POA: Diagnosis not present

## 2019-07-20 DIAGNOSIS — I5032 Chronic diastolic (congestive) heart failure: Secondary | ICD-10-CM | POA: Diagnosis not present

## 2019-07-20 DIAGNOSIS — I739 Peripheral vascular disease, unspecified: Secondary | ICD-10-CM | POA: Diagnosis not present

## 2019-07-20 DIAGNOSIS — I4811 Longstanding persistent atrial fibrillation: Secondary | ICD-10-CM | POA: Diagnosis not present

## 2019-07-20 DIAGNOSIS — J449 Chronic obstructive pulmonary disease, unspecified: Secondary | ICD-10-CM | POA: Diagnosis not present

## 2019-07-21 DIAGNOSIS — F05 Delirium due to known physiological condition: Secondary | ICD-10-CM | POA: Diagnosis not present

## 2019-07-21 DIAGNOSIS — F0391 Unspecified dementia with behavioral disturbance: Secondary | ICD-10-CM | POA: Diagnosis not present

## 2019-07-21 DIAGNOSIS — F1011 Alcohol abuse, in remission: Secondary | ICD-10-CM | POA: Diagnosis not present

## 2019-07-26 DIAGNOSIS — E119 Type 2 diabetes mellitus without complications: Secondary | ICD-10-CM | POA: Diagnosis not present

## 2019-07-26 DIAGNOSIS — E038 Other specified hypothyroidism: Secondary | ICD-10-CM | POA: Diagnosis not present

## 2019-07-26 DIAGNOSIS — D518 Other vitamin B12 deficiency anemias: Secondary | ICD-10-CM | POA: Diagnosis not present

## 2019-07-26 DIAGNOSIS — F330 Major depressive disorder, recurrent, mild: Secondary | ICD-10-CM | POA: Diagnosis not present

## 2019-07-26 DIAGNOSIS — F419 Anxiety disorder, unspecified: Secondary | ICD-10-CM | POA: Diagnosis not present

## 2019-07-27 DIAGNOSIS — E7849 Other hyperlipidemia: Secondary | ICD-10-CM | POA: Diagnosis not present

## 2019-07-27 DIAGNOSIS — D518 Other vitamin B12 deficiency anemias: Secondary | ICD-10-CM | POA: Diagnosis not present

## 2019-07-27 DIAGNOSIS — Z79899 Other long term (current) drug therapy: Secondary | ICD-10-CM | POA: Diagnosis not present

## 2019-07-27 DIAGNOSIS — E559 Vitamin D deficiency, unspecified: Secondary | ICD-10-CM | POA: Diagnosis not present

## 2019-07-27 DIAGNOSIS — E038 Other specified hypothyroidism: Secondary | ICD-10-CM | POA: Diagnosis not present

## 2019-07-27 DIAGNOSIS — E119 Type 2 diabetes mellitus without complications: Secondary | ICD-10-CM | POA: Diagnosis not present

## 2019-08-08 ENCOUNTER — Ambulatory Visit (INDEPENDENT_AMBULATORY_CARE_PROVIDER_SITE_OTHER): Payer: Medicare Other | Admitting: *Deleted

## 2019-08-08 DIAGNOSIS — I495 Sick sinus syndrome: Secondary | ICD-10-CM

## 2019-08-08 DIAGNOSIS — I4819 Other persistent atrial fibrillation: Secondary | ICD-10-CM

## 2019-08-09 LAB — CUP PACEART REMOTE DEVICE CHECK
Battery Impedance: 802 Ohm
Battery Remaining Longevity: 82 mo
Battery Voltage: 2.78 V
Brady Statistic RV Percent Paced: 8 %
Date Time Interrogation Session: 20201028171922
Implantable Lead Implant Date: 20120609
Implantable Lead Implant Date: 20120609
Implantable Lead Location: 753859
Implantable Lead Location: 753860
Implantable Lead Model: 5076
Implantable Lead Model: 5076
Implantable Pulse Generator Implant Date: 20120609
Lead Channel Impedance Value: 385 Ohm
Lead Channel Impedance Value: 67 Ohm
Lead Channel Pacing Threshold Amplitude: 0.875 V
Lead Channel Pacing Threshold Pulse Width: 0.4 ms
Lead Channel Setting Pacing Amplitude: 2.5 V
Lead Channel Setting Pacing Pulse Width: 0.4 ms
Lead Channel Setting Sensing Sensitivity: 2.8 mV

## 2019-08-10 DIAGNOSIS — F419 Anxiety disorder, unspecified: Secondary | ICD-10-CM | POA: Diagnosis not present

## 2019-08-10 DIAGNOSIS — F330 Major depressive disorder, recurrent, mild: Secondary | ICD-10-CM | POA: Diagnosis not present

## 2019-08-14 DIAGNOSIS — L84 Corns and callosities: Secondary | ICD-10-CM | POA: Diagnosis not present

## 2019-08-14 DIAGNOSIS — I739 Peripheral vascular disease, unspecified: Secondary | ICD-10-CM | POA: Diagnosis not present

## 2019-08-14 DIAGNOSIS — B351 Tinea unguium: Secondary | ICD-10-CM | POA: Diagnosis not present

## 2019-08-14 DIAGNOSIS — M79675 Pain in left toe(s): Secondary | ICD-10-CM | POA: Diagnosis not present

## 2019-08-17 DIAGNOSIS — I5032 Chronic diastolic (congestive) heart failure: Secondary | ICD-10-CM | POA: Diagnosis not present

## 2019-08-17 DIAGNOSIS — E870 Hyperosmolality and hypernatremia: Secondary | ICD-10-CM | POA: Diagnosis not present

## 2019-08-17 DIAGNOSIS — J449 Chronic obstructive pulmonary disease, unspecified: Secondary | ICD-10-CM | POA: Diagnosis not present

## 2019-08-17 DIAGNOSIS — I4811 Longstanding persistent atrial fibrillation: Secondary | ICD-10-CM | POA: Diagnosis not present

## 2019-08-22 DIAGNOSIS — D518 Other vitamin B12 deficiency anemias: Secondary | ICD-10-CM | POA: Diagnosis not present

## 2019-08-22 DIAGNOSIS — E038 Other specified hypothyroidism: Secondary | ICD-10-CM | POA: Diagnosis not present

## 2019-08-22 DIAGNOSIS — E119 Type 2 diabetes mellitus without complications: Secondary | ICD-10-CM | POA: Diagnosis not present

## 2019-08-24 DIAGNOSIS — F419 Anxiety disorder, unspecified: Secondary | ICD-10-CM | POA: Diagnosis not present

## 2019-08-24 DIAGNOSIS — F330 Major depressive disorder, recurrent, mild: Secondary | ICD-10-CM | POA: Diagnosis not present

## 2019-08-25 DIAGNOSIS — F0391 Unspecified dementia with behavioral disturbance: Secondary | ICD-10-CM | POA: Diagnosis not present

## 2019-08-25 DIAGNOSIS — F1011 Alcohol abuse, in remission: Secondary | ICD-10-CM | POA: Diagnosis not present

## 2019-08-25 DIAGNOSIS — F05 Delirium due to known physiological condition: Secondary | ICD-10-CM | POA: Diagnosis not present

## 2019-08-29 NOTE — Progress Notes (Signed)
Remote pacemaker transmission.   

## 2019-08-30 DIAGNOSIS — E119 Type 2 diabetes mellitus without complications: Secondary | ICD-10-CM | POA: Diagnosis not present

## 2019-08-30 DIAGNOSIS — D518 Other vitamin B12 deficiency anemias: Secondary | ICD-10-CM | POA: Diagnosis not present

## 2019-08-30 DIAGNOSIS — E7849 Other hyperlipidemia: Secondary | ICD-10-CM | POA: Diagnosis not present

## 2019-08-30 DIAGNOSIS — Z79899 Other long term (current) drug therapy: Secondary | ICD-10-CM | POA: Diagnosis not present

## 2019-09-08 DIAGNOSIS — F419 Anxiety disorder, unspecified: Secondary | ICD-10-CM | POA: Diagnosis not present

## 2019-09-08 DIAGNOSIS — F330 Major depressive disorder, recurrent, mild: Secondary | ICD-10-CM | POA: Diagnosis not present

## 2019-09-14 DIAGNOSIS — G301 Alzheimer's disease with late onset: Secondary | ICD-10-CM | POA: Diagnosis not present

## 2019-09-14 DIAGNOSIS — J449 Chronic obstructive pulmonary disease, unspecified: Secondary | ICD-10-CM | POA: Diagnosis not present

## 2019-09-14 DIAGNOSIS — I5032 Chronic diastolic (congestive) heart failure: Secondary | ICD-10-CM | POA: Diagnosis not present

## 2019-09-14 DIAGNOSIS — I10 Essential (primary) hypertension: Secondary | ICD-10-CM | POA: Diagnosis not present

## 2019-09-15 DIAGNOSIS — E7849 Other hyperlipidemia: Secondary | ICD-10-CM | POA: Diagnosis not present

## 2019-09-15 DIAGNOSIS — E119 Type 2 diabetes mellitus without complications: Secondary | ICD-10-CM | POA: Diagnosis not present

## 2019-09-15 DIAGNOSIS — D518 Other vitamin B12 deficiency anemias: Secondary | ICD-10-CM | POA: Diagnosis not present

## 2019-09-15 DIAGNOSIS — Z79899 Other long term (current) drug therapy: Secondary | ICD-10-CM | POA: Diagnosis not present

## 2019-09-23 ENCOUNTER — Encounter (HOSPITAL_COMMUNITY): Payer: Self-pay | Admitting: Emergency Medicine

## 2019-09-23 ENCOUNTER — Other Ambulatory Visit: Payer: Self-pay

## 2019-09-23 ENCOUNTER — Emergency Department (HOSPITAL_COMMUNITY): Payer: Medicare Other

## 2019-09-23 ENCOUNTER — Inpatient Hospital Stay (HOSPITAL_COMMUNITY)
Admission: EM | Admit: 2019-09-23 | Discharge: 2019-10-13 | DRG: 871 | Disposition: E | Payer: Medicare Other | Source: Skilled Nursing Facility | Attending: Internal Medicine | Admitting: Internal Medicine

## 2019-09-23 DIAGNOSIS — Z66 Do not resuscitate: Secondary | ICD-10-CM | POA: Diagnosis present

## 2019-09-23 DIAGNOSIS — I4821 Permanent atrial fibrillation: Secondary | ICD-10-CM | POA: Diagnosis present

## 2019-09-23 DIAGNOSIS — Z951 Presence of aortocoronary bypass graft: Secondary | ICD-10-CM

## 2019-09-23 DIAGNOSIS — E861 Hypovolemia: Secondary | ICD-10-CM | POA: Diagnosis present

## 2019-09-23 DIAGNOSIS — U071 COVID-19: Secondary | ICD-10-CM | POA: Diagnosis present

## 2019-09-23 DIAGNOSIS — Z881 Allergy status to other antibiotic agents status: Secondary | ICD-10-CM

## 2019-09-23 DIAGNOSIS — A4189 Other specified sepsis: Principal | ICD-10-CM | POA: Diagnosis present

## 2019-09-23 DIAGNOSIS — J440 Chronic obstructive pulmonary disease with acute lower respiratory infection: Secondary | ICD-10-CM | POA: Diagnosis present

## 2019-09-23 DIAGNOSIS — Y95 Nosocomial condition: Secondary | ICD-10-CM | POA: Diagnosis present

## 2019-09-23 DIAGNOSIS — Z87891 Personal history of nicotine dependence: Secondary | ICD-10-CM

## 2019-09-23 DIAGNOSIS — R0602 Shortness of breath: Secondary | ICD-10-CM | POA: Diagnosis present

## 2019-09-23 DIAGNOSIS — F0390 Unspecified dementia without behavioral disturbance: Secondary | ICD-10-CM | POA: Diagnosis present

## 2019-09-23 DIAGNOSIS — I5032 Chronic diastolic (congestive) heart failure: Secondary | ICD-10-CM | POA: Diagnosis present

## 2019-09-23 DIAGNOSIS — L89616 Pressure-induced deep tissue damage of right heel: Secondary | ICD-10-CM | POA: Diagnosis present

## 2019-09-23 DIAGNOSIS — N179 Acute kidney failure, unspecified: Secondary | ICD-10-CM | POA: Diagnosis present

## 2019-09-23 DIAGNOSIS — Z801 Family history of malignant neoplasm of trachea, bronchus and lung: Secondary | ICD-10-CM

## 2019-09-23 DIAGNOSIS — K219 Gastro-esophageal reflux disease without esophagitis: Secondary | ICD-10-CM | POA: Diagnosis present

## 2019-09-23 DIAGNOSIS — G9341 Metabolic encephalopathy: Secondary | ICD-10-CM | POA: Diagnosis present

## 2019-09-23 DIAGNOSIS — J1289 Other viral pneumonia: Secondary | ICD-10-CM | POA: Diagnosis present

## 2019-09-23 DIAGNOSIS — I10 Essential (primary) hypertension: Secondary | ICD-10-CM | POA: Diagnosis present

## 2019-09-23 DIAGNOSIS — Z880 Allergy status to penicillin: Secondary | ICD-10-CM

## 2019-09-23 DIAGNOSIS — G309 Alzheimer's disease, unspecified: Secondary | ICD-10-CM | POA: Diagnosis not present

## 2019-09-23 DIAGNOSIS — I739 Peripheral vascular disease, unspecified: Secondary | ICD-10-CM | POA: Diagnosis present

## 2019-09-23 DIAGNOSIS — E870 Hyperosmolality and hypernatremia: Secondary | ICD-10-CM | POA: Diagnosis present

## 2019-09-23 DIAGNOSIS — I495 Sick sinus syndrome: Secondary | ICD-10-CM | POA: Diagnosis present

## 2019-09-23 DIAGNOSIS — Z800 Family history of malignant neoplasm of digestive organs: Secondary | ICD-10-CM

## 2019-09-23 DIAGNOSIS — Z515 Encounter for palliative care: Secondary | ICD-10-CM

## 2019-09-23 DIAGNOSIS — Z7901 Long term (current) use of anticoagulants: Secondary | ICD-10-CM

## 2019-09-23 DIAGNOSIS — L8990 Pressure ulcer of unspecified site, unspecified stage: Secondary | ICD-10-CM | POA: Insufficient documentation

## 2019-09-23 DIAGNOSIS — E860 Dehydration: Secondary | ICD-10-CM | POA: Diagnosis present

## 2019-09-23 DIAGNOSIS — Z888 Allergy status to other drugs, medicaments and biological substances status: Secondary | ICD-10-CM

## 2019-09-23 DIAGNOSIS — R6520 Severe sepsis without septic shock: Secondary | ICD-10-CM | POA: Diagnosis present

## 2019-09-23 DIAGNOSIS — Z923 Personal history of irradiation: Secondary | ICD-10-CM

## 2019-09-23 DIAGNOSIS — G4733 Obstructive sleep apnea (adult) (pediatric): Secondary | ICD-10-CM | POA: Diagnosis present

## 2019-09-23 DIAGNOSIS — Z79899 Other long term (current) drug therapy: Secondary | ICD-10-CM

## 2019-09-23 DIAGNOSIS — I110 Hypertensive heart disease with heart failure: Secondary | ICD-10-CM | POA: Diagnosis present

## 2019-09-23 DIAGNOSIS — Z8546 Personal history of malignant neoplasm of prostate: Secondary | ICD-10-CM

## 2019-09-23 DIAGNOSIS — J9601 Acute respiratory failure with hypoxia: Secondary | ICD-10-CM | POA: Diagnosis present

## 2019-09-23 DIAGNOSIS — L89156 Pressure-induced deep tissue damage of sacral region: Secondary | ICD-10-CM | POA: Diagnosis present

## 2019-09-23 DIAGNOSIS — Z85828 Personal history of other malignant neoplasm of skin: Secondary | ICD-10-CM

## 2019-09-23 DIAGNOSIS — I4819 Other persistent atrial fibrillation: Secondary | ICD-10-CM | POA: Diagnosis not present

## 2019-09-23 DIAGNOSIS — I2510 Atherosclerotic heart disease of native coronary artery without angina pectoris: Secondary | ICD-10-CM | POA: Diagnosis present

## 2019-09-23 DIAGNOSIS — F0281 Dementia in other diseases classified elsewhere with behavioral disturbance: Secondary | ICD-10-CM | POA: Diagnosis not present

## 2019-09-23 DIAGNOSIS — Z950 Presence of cardiac pacemaker: Secondary | ICD-10-CM

## 2019-09-23 DIAGNOSIS — Z7951 Long term (current) use of inhaled steroids: Secondary | ICD-10-CM

## 2019-09-23 DIAGNOSIS — A419 Sepsis, unspecified organism: Secondary | ICD-10-CM | POA: Diagnosis not present

## 2019-09-23 DIAGNOSIS — Z7189 Other specified counseling: Secondary | ICD-10-CM | POA: Diagnosis not present

## 2019-09-23 DIAGNOSIS — Z87442 Personal history of urinary calculi: Secondary | ICD-10-CM

## 2019-09-23 DIAGNOSIS — J1282 Pneumonia due to coronavirus disease 2019: Secondary | ICD-10-CM | POA: Diagnosis present

## 2019-09-23 DIAGNOSIS — J449 Chronic obstructive pulmonary disease, unspecified: Secondary | ICD-10-CM | POA: Diagnosis present

## 2019-09-23 DIAGNOSIS — Z8041 Family history of malignant neoplasm of ovary: Secondary | ICD-10-CM

## 2019-09-23 DIAGNOSIS — F329 Major depressive disorder, single episode, unspecified: Secondary | ICD-10-CM | POA: Diagnosis present

## 2019-09-23 DIAGNOSIS — E785 Hyperlipidemia, unspecified: Secondary | ICD-10-CM | POA: Diagnosis present

## 2019-09-23 DIAGNOSIS — Z8673 Personal history of transient ischemic attack (TIA), and cerebral infarction without residual deficits: Secondary | ICD-10-CM

## 2019-09-23 DIAGNOSIS — J690 Pneumonitis due to inhalation of food and vomit: Secondary | ICD-10-CM | POA: Diagnosis present

## 2019-09-23 LAB — COMPREHENSIVE METABOLIC PANEL
ALT: 26 U/L (ref 0–44)
AST: 32 U/L (ref 15–41)
Albumin: 2.9 g/dL — ABNORMAL LOW (ref 3.5–5.0)
Alkaline Phosphatase: 52 U/L (ref 38–126)
BUN: 97 mg/dL — ABNORMAL HIGH (ref 8–23)
CO2: 28 mmol/L (ref 22–32)
Calcium: 9.2 mg/dL (ref 8.9–10.3)
Chloride: >130 mmol/L (ref 98–111)
Creatinine, Ser: 2.77 mg/dL — ABNORMAL HIGH (ref 0.61–1.24)
GFR calc Af Amer: 23 mL/min — ABNORMAL LOW (ref >60)
GFR calc non Af Amer: 20 mL/min — ABNORMAL LOW (ref >60)
Glucose, Bld: 112 mg/dL — ABNORMAL HIGH (ref 70–99)
Potassium: 3.5 mmol/L (ref 3.5–5.1)
Sodium: 175 mmol/L (ref 135–145)
Total Bilirubin: 0.7 mg/dL (ref 0.3–1.2)
Total Protein: 7.1 g/dL (ref 6.5–8.1)

## 2019-09-23 LAB — CBC WITH DIFFERENTIAL/PLATELET
Abs Immature Granulocytes: 0.49 10*3/uL — ABNORMAL HIGH (ref 0.00–0.07)
Basophils Absolute: 0 10*3/uL (ref 0.0–0.1)
Basophils Relative: 0 %
Eosinophils Absolute: 0 10*3/uL (ref 0.0–0.5)
Eosinophils Relative: 0 %
HCT: 54.3 % — ABNORMAL HIGH (ref 39.0–52.0)
Hemoglobin: 15.1 g/dL (ref 13.0–17.0)
Immature Granulocytes: 4 %
Lymphocytes Relative: 8 %
Lymphs Abs: 0.9 10*3/uL (ref 0.7–4.0)
MCH: 28.3 pg (ref 26.0–34.0)
MCHC: 27.8 g/dL — ABNORMAL LOW (ref 30.0–36.0)
MCV: 101.9 fL — ABNORMAL HIGH (ref 80.0–100.0)
Monocytes Absolute: 0.1 10*3/uL (ref 0.1–1.0)
Monocytes Relative: 1 %
Neutro Abs: 10.6 10*3/uL — ABNORMAL HIGH (ref 1.7–7.7)
Neutrophils Relative %: 87 %
Platelets: 164 10*3/uL (ref 150–400)
RBC: 5.33 MIL/uL (ref 4.22–5.81)
RDW: 16.3 % — ABNORMAL HIGH (ref 11.5–15.5)
WBC: 12.1 10*3/uL — ABNORMAL HIGH (ref 4.0–10.5)
nRBC: 0 % (ref 0.0–0.2)

## 2019-09-23 LAB — BLOOD GAS, ARTERIAL
Acid-Base Excess: 1 mmol/L (ref 0.0–2.0)
Bicarbonate: 25.4 mmol/L (ref 20.0–28.0)
FIO2: 100
O2 Saturation: 98.2 %
Patient temperature: 38.3
pCO2 arterial: 39 mmHg (ref 32.0–48.0)
pH, Arterial: 7.423 (ref 7.350–7.450)
pO2, Arterial: 107 mmHg (ref 83.0–108.0)

## 2019-09-23 LAB — PROTIME-INR
INR: 1.2 (ref 0.8–1.2)
Prothrombin Time: 15.4 seconds — ABNORMAL HIGH (ref 11.4–15.2)

## 2019-09-23 LAB — URINALYSIS, ROUTINE W REFLEX MICROSCOPIC
Bilirubin Urine: NEGATIVE
Glucose, UA: NEGATIVE mg/dL
Ketones, ur: NEGATIVE mg/dL
Leukocytes,Ua: NEGATIVE
Nitrite: NEGATIVE
Protein, ur: 30 mg/dL — AB
Specific Gravity, Urine: 1.017 (ref 1.005–1.030)
pH: 5 (ref 5.0–8.0)

## 2019-09-23 LAB — BASIC METABOLIC PANEL
BUN: 95 mg/dL — ABNORMAL HIGH (ref 8–23)
CO2: 22 mmol/L (ref 22–32)
Calcium: 8.2 mg/dL — ABNORMAL LOW (ref 8.9–10.3)
Chloride: >130 mmol/L (ref 98–111)
Creatinine, Ser: 2.51 mg/dL — ABNORMAL HIGH (ref 0.61–1.24)
GFR calc Af Amer: 26 mL/min — ABNORMAL LOW (ref >60)
GFR calc non Af Amer: 23 mL/min — ABNORMAL LOW (ref >60)
Glucose, Bld: 174 mg/dL — ABNORMAL HIGH (ref 70–99)
Potassium: 3.5 mmol/L (ref 3.5–5.1)
Sodium: 172 mmol/L (ref 135–145)

## 2019-09-23 LAB — TRIGLYCERIDES: Triglycerides: 140 mg/dL (ref <150)

## 2019-09-23 LAB — FIBRINOGEN: Fibrinogen: 718 mg/dL — ABNORMAL HIGH (ref 210–475)

## 2019-09-23 LAB — BRAIN NATRIURETIC PEPTIDE: B Natriuretic Peptide: 605 pg/mL — ABNORMAL HIGH (ref 0.0–100.0)

## 2019-09-23 LAB — LACTIC ACID, PLASMA
Lactic Acid, Venous: 1.9 mmol/L (ref 0.5–1.9)
Lactic Acid, Venous: 1.5 mmol/L (ref 0.5–1.9)

## 2019-09-23 LAB — APTT: aPTT: 40 seconds — ABNORMAL HIGH (ref 24–36)

## 2019-09-23 LAB — LACTATE DEHYDROGENASE: LDH: 284 U/L — ABNORMAL HIGH (ref 98–192)

## 2019-09-23 LAB — D-DIMER, QUANTITATIVE: D-Dimer, Quant: 2.65 ug/mL-FEU — ABNORMAL HIGH (ref 0.00–0.50)

## 2019-09-23 LAB — POC SARS CORONAVIRUS 2 AG -  ED: SARS Coronavirus 2 Ag: POSITIVE — AB

## 2019-09-23 LAB — FERRITIN: Ferritin: 467 ng/mL — ABNORMAL HIGH (ref 24–336)

## 2019-09-23 LAB — PROCALCITONIN: Procalcitonin: 3.24 ng/mL

## 2019-09-23 LAB — C-REACTIVE PROTEIN: CRP: 25.8 mg/dL — ABNORMAL HIGH (ref <1.0)

## 2019-09-23 MED ORDER — DEXTROSE 5 % IV SOLN
INTRAVENOUS | Status: DC
  Administered 2019-09-23 – 2019-09-24 (×2): via INTRAVENOUS

## 2019-09-23 MED ORDER — SODIUM CHLORIDE 0.9 % IV SOLN
1.0000 g | Freq: Three times a day (TID) | INTRAVENOUS | Status: DC
  Administered 2019-09-23 – 2019-09-25 (×5): 1 g via INTRAVENOUS
  Filled 2019-09-23 (×9): qty 1

## 2019-09-23 MED ORDER — SODIUM CHLORIDE 0.9 % IV SOLN
200.0000 mg | Freq: Once | INTRAVENOUS | Status: AC
  Administered 2019-09-23: 22:00:00 200 mg via INTRAVENOUS
  Filled 2019-09-23: qty 40

## 2019-09-23 MED ORDER — VANCOMYCIN HCL 1.5 G IV SOLR
1500.0000 mg | Freq: Once | INTRAVENOUS | Status: AC
  Administered 2019-09-23: 16:00:00 1500 mg via INTRAVENOUS
  Filled 2019-09-23: qty 1500

## 2019-09-23 MED ORDER — SODIUM CHLORIDE 0.9 % IV BOLUS
1000.0000 mL | Freq: Once | INTRAVENOUS | Status: AC
  Administered 2019-09-23: 15:00:00 1000 mL via INTRAVENOUS

## 2019-09-23 MED ORDER — ACETAMINOPHEN 325 MG PO TABS: 650.0000 mg | ORAL_TABLET | Freq: Four times a day (QID) | ORAL | Status: DC | PRN

## 2019-09-23 MED ORDER — MOMETASONE FURO-FORMOTEROL FUM 100-5 MCG/ACT IN AERO
2.0000 | INHALATION_SPRAY | Freq: Two times a day (BID) | RESPIRATORY_TRACT | Status: DC
  Filled 2019-09-23: qty 8.8

## 2019-09-23 MED ORDER — ONDANSETRON HCL 4 MG/2ML IJ SOLN: 4.0000 mg | Freq: Four times a day (QID) | INTRAMUSCULAR | Status: DC | PRN

## 2019-09-23 MED ORDER — SODIUM CHLORIDE 0.9 % IV SOLN
INTRAVENOUS | Status: AC
  Filled 2019-09-23 (×2): qty 1

## 2019-09-23 MED ORDER — VANCOMYCIN HCL IN DEXTROSE 750-5 MG/150ML-% IV SOLN
750.0000 mg | INTRAVENOUS | Status: DC
  Administered 2019-09-24: 17:00:00 750 mg via INTRAVENOUS
  Filled 2019-09-23: qty 150

## 2019-09-23 MED ORDER — SODIUM CHLORIDE 0.9 % IV SOLN
2.0000 g | Freq: Once | INTRAVENOUS | Status: AC
  Administered 2019-09-23: 2 g via INTRAVENOUS
  Filled 2019-09-23: qty 2

## 2019-09-23 MED ORDER — DILTIAZEM HCL-DEXTROSE 125-5 MG/125ML-% IV SOLN (PREMIX)
5.0000 mg/h | INTRAVENOUS | Status: DC
  Administered 2019-09-23: 5 mg/h via INTRAVENOUS
  Administered 2019-09-23: 12 mg/h via INTRAVENOUS
  Administered 2019-09-24: 21:00:00 7.5 mg/h via INTRAVENOUS
  Administered 2019-09-24: 08:00:00 13 mg/h via INTRAVENOUS
  Filled 2019-09-23 (×3): qty 125

## 2019-09-23 MED ORDER — DEXAMETHASONE SODIUM PHOSPHATE 10 MG/ML IJ SOLN
6.0000 mg | INTRAMUSCULAR | Status: DC
  Administered 2019-09-24: 15:00:00 6 mg via INTRAVENOUS
  Filled 2019-09-23 (×4): qty 0.6

## 2019-09-23 MED ORDER — METRONIDAZOLE IN NACL 5-0.79 MG/ML-% IV SOLN
500.0000 mg | Freq: Three times a day (TID) | INTRAVENOUS | Status: DC
  Administered 2019-09-23 – 2019-09-25 (×5): 500 mg via INTRAVENOUS
  Filled 2019-09-23 (×5): qty 100

## 2019-09-23 MED ORDER — VANCOMYCIN HCL IN DEXTROSE 1-5 GM/200ML-% IV SOLN: 1000.0000 mg | Freq: Once | INTRAVENOUS | Status: DC

## 2019-09-23 MED ORDER — DEXAMETHASONE SODIUM PHOSPHATE 10 MG/ML IJ SOLN
10.0000 mg | Freq: Once | INTRAMUSCULAR | Status: AC
  Administered 2019-09-23: 10 mg via INTRAVENOUS
  Filled 2019-09-23: qty 1

## 2019-09-23 MED ORDER — FAMOTIDINE IN NACL 20-0.9 MG/50ML-% IV SOLN: 20.0000 mg | Freq: Two times a day (BID) | INTRAVENOUS | Status: DC

## 2019-09-23 MED ORDER — METRONIDAZOLE IN NACL 5-0.79 MG/ML-% IV SOLN
500.0000 mg | Freq: Once | INTRAVENOUS | Status: AC
  Administered 2019-09-23: 500 mg via INTRAVENOUS
  Filled 2019-09-23: qty 100

## 2019-09-23 MED ORDER — ENOXAPARIN SODIUM 30 MG/0.3ML ~~LOC~~ SOLN
30.0000 mg | SUBCUTANEOUS | Status: DC
  Administered 2019-09-23 – 2019-09-24 (×2): 30 mg via SUBCUTANEOUS
  Filled 2019-09-23 (×2): qty 0.3

## 2019-09-23 MED ORDER — SODIUM CHLORIDE 0.9 % IV BOLUS
500.0000 mL | Freq: Once | INTRAVENOUS | Status: AC
  Administered 2019-09-23: 22:00:00 500 mL via INTRAVENOUS

## 2019-09-23 MED ORDER — ALBUTEROL SULFATE HFA 108 (90 BASE) MCG/ACT IN AERS
2.0000 | INHALATION_SPRAY | RESPIRATORY_TRACT | Status: DC | PRN
  Filled 2019-09-23: qty 6.7

## 2019-09-23 MED ORDER — DILTIAZEM LOAD VIA INFUSION
10.0000 mg | Freq: Once | INTRAVENOUS | Status: AC
  Administered 2019-09-23: 16:00:00 10 mg via INTRAVENOUS
  Filled 2019-09-23: qty 10

## 2019-09-23 MED ORDER — SODIUM CHLORIDE 0.9 % IV SOLN
100.0000 mg | Freq: Every day | INTRAVENOUS | Status: DC
  Administered 2019-09-24 – 2019-09-25 (×2): 100 mg via INTRAVENOUS
  Filled 2019-09-23: qty 100
  Filled 2019-09-23 (×2): qty 20

## 2019-09-23 MED ORDER — ONDANSETRON HCL 4 MG PO TABS: 4.0000 mg | ORAL_TABLET | Freq: Four times a day (QID) | ORAL | Status: DC | PRN

## 2019-09-23 NOTE — ED Triage Notes (Signed)
Patient arrives to ED via [facility] due to low 02 85% on room air, shortness of breath, and altered mental status.  Patient initially 91% on 4LPM nasal cannula. EMS given this patient 125mg  IV solumedrol.  Patient roomed, sats fall to 54%, patient placed on non rebreather, sats remained at 64%.  Respiratory at bedside for BIPAP.  Family made aware.

## 2019-09-23 NOTE — H&P (Addendum)
History and Physical    Zachary Warren [id number] DOB: October 02, 1933 DOA: 09/22/2019  PCP: [name], NP   Patient coming from: [place] have personally briefly reviewed patient's old medical records in [facility]  Chief Complaint: Altered mental status, fever  HPI: Zachary Warren is a 85 y.o. male with medical history significant for dementia, COPD, atrial fibrillation, bradycardia tachycardia syndrome, coronary artery disease, hypertension, seizures.  Patient was brought to the ED from nursing home with reports of altered mental status, difficulty breathing, and low oxygen levels.  On initial evaluation patient was unresponsive, with time of my evaluation patient opened his eyes but was not verbally answering questions or following directions.  History is obtained from chart review and talking to family spouse 2 daughters and son. At baseline patient is able to awake and alert, able to communicate, answers questions appropriately, he has mild dementia. The first liter reported hypoxia O2 sats in the 50s, with tachycardia and fevers.  ED Course: Temperature 102.8, tachycardic to 179, O2 sats 54% on nonrebreather mask, subsequently switched to BiPAP- [employer]-this was later discontinued.  Markedly elevated sodium at 175, chloride greater than 130, creatinine elevated 2.77, WBC 12.1.  Lactic acid 1.9.  Procalcitonin elevated at 3.24.  Portable chest x-ray heterogeneous bilateral airspace opacities most conspicuous in the right mid lung in keeping with reported diagnosis of COVID-19.  Patient was started on IV vancomycin, metronidazole and aztreonam.  1 L bolus normal saline given.  Cardizem 10mg  bolus given, and GTT initiated, 10 mg dexamethasone given. EDP talked to patient's wife and son decision was made to make patient comfort care.  Review of Systems: Unable to assess due to altered mental status.  Past Medical History:  Diagnosis Date  . Atypical  nevus 04/26/1990   left forearm (moderate) tx w/s  . Atypical nevus 02/24/2000   1. left forearm, ulnar (moderate), 2. left forearm radial (moderate) tx w/s  . Atypical nevus 04/16/2000   left forearm radial tx Dr Zachary Warren  . Atypical nevus 09/08/2014   right back moderate   . BCC (basal cell carcinoma of skin) 05/25/2005   bcc left upper forehead tx [facility] Dr Vida Rigger  . BCC (basal cell carcinoma of skin) 12/09/2010   bcc ear tx cx3 93fu  . BCC (basal cell carcinoma of skin) 07/20/2012   bcc left neck tx curet cautery   . BCC (basal cell carcinoma of skin) 03/13/2014   bcc left eyelid tx MOHS  . BCC (basal cell carcinoma of skin) 05/11/2017   bcc ulc. nod. left temple tx letter mailed to patient surg needed  . COPD (chronic obstructive pulmonary disease) ([place])   . Coronary artery disease    s/p 5v CABG 2002  . CVA (cerebral infarction) 1999  . DDD (degenerative disc disease), cervical   . Dementia ([place])   . Depression   . GERD (gastroesophageal reflux disease)   . History of kidney stones   . Hyperlipidemia   . Hypertension   . Memory impairment   . OSA (obstructive sleep apnea)    Recently diagnosed though pt. has not had CPAP trial  . Permanent atrial fibrillation (HCC)    chads2vasc score is 6  . Pneumonia    recent hospital admission for pneumonia   . Prostate cancer ([place]) 2006   s/p XRT  . PVD (peripheral vascular disease) ([place])   . SCCA (squamous cell carcinoma) of skin 05/25/2005   scc in situ left hand  tx cx3 24fu  . SCCA (squamous cell carcinoma) of skin 05/25/2005   scc in situ left ear rim post tx cx3 38fu  . SCCA (squamous cell carcinoma) of skin 02/12/2016   scc in situ right hand tx curet cautery  . Seizures ([place])   . Tachycardia-bradycardia [facility])    s/p PPM by [facility] 6/12    Past Surgical History:  Procedure Laterality Date  . CARDIOVERSION N/A 08/21/2013   Procedure: CARDIOVERSION;  Surgeon: Candee Furbish, MD;  Location: [place];  Service: Cardiovascular;   Laterality: N/A;  . [place]  . CORONARY ARTERY BYPASS GRAFT  2002  . CYSTOSCOPY W/ URETERAL STENT PLACEMENT Left 05/13/2017   Procedure: CYSTOSCOPY WITH RETROGRADE PYELOGRAM/URETERAL STENT PLACEMENT LEFT;  Surgeon: Alexis Frock, MD;  Location: [facility];  Service: Urology;  Laterality: Left;  . CYSTOSCOPY WITH RETROGRADE PYELOGRAM, URETEROSCOPY AND STENT PLACEMENT Left 06/09/2017   Procedure: CYSTOSCOPY WITH RETROGRADE PYELOGRAM, URETEROSCOPY;  Surgeon: Alexis Frock, MD;  Location: [facility];  Service: Urology;  Laterality: Left;  . HOLMIUM LASER APPLICATION Left [id number]   Procedure: HOLMIUM LASER APPLICATION;  Surgeon: Alexis Frock, MD;  Location: [facility];  Service: Urology;  Laterality: Left;  . INSERT / REPLACE / REMOVE PACEMAKER    . NECK SURGERY     hx of   . PACEMAKER INSERTION     implanted by [facility] 6/12     reports that he quit smoking about 21 years ago. His smoking use included cigarettes. He has a 96.00 pack-year smoking history. He has never used smokeless tobacco. He reports that he does not drink alcohol or use drugs.  Allergies  Allergen Reactions  . Aricept [Donepezil Hcl] Other (See Comments)    "stomach pain", generic  . Ceclor [Cefaclor]   . Namenda [Memantine Hcl] Other (See Comments)    "dizzy", brand medication  . Penicillins     Has patient had a PCN reaction causing immediate rash, facial/tongue/throat swelling, SOB or lightheadedness with hypotension: Unknown Has patient had a PCN reaction causing severe rash involving mucus membranes or skin necrosis: Unknown Has patient had a PCN reaction that required hospitalization: Unknown Has patient had a PCN reaction occurring within the last 10 years: Unknown If all of the above answers are "NO", then may proceed with Cephalosporin use.   . Ramipril Other (See Comments)     cough    Family History  Problem Relation Age of Onset  . Cancer Mother        ovarian  . Ovarian cancer Mother   . Cancer Father         pancreatic  . Pancreatic cancer Father   . Cancer Brother        lung  . Ovarian cancer Other   . Pancreatic cancer Other   . Lung cancer Other     Prior to Admission medications   Medication Sig Start Date End Date Taking? Authorizing Provider  acetaminophen (TYLENOL) 325 MG tablet Take 500 mg by mouth every 6 (six) hours as needed.    [provider]  apixaban (ELIQUIS) 5 MG TABS tablet Take 1 tablet (5 mg total) by mouth 2 (two) times daily. 10/27/17   Allred, Jeneen Rinks, MD  atorvastatin (LIPITOR) 40 MG tablet TAKE 1 TABLET BY MOUTH EVERY DAY - PATIENT DUE FOR LIPID PANEL 06/01/17   Jerline Pain, MD  budesonide-formoterol [facility]) 80-4.5 MCG/ACT inhaler Inhale 2 puffs into the lungs daily.    [provider]  buPROPion [facility]) 75  MG tablet Take 75 mg by mouth 2 (two) times daily. 08/31/17   [provider]  calcium carbonate (TUMS - DOSED IN MG ELEMENTAL CALCIUM) 500 MG chewable tablet Chew 1 tablet by mouth 2 (two) times daily.    [provider]  cholecalciferol (VITAMIN D) 1000 units tablet Take 1,000 Units by mouth daily.    [provider]  diltiazem (CARTIA XT) 180 MG 24 hr capsule Take 1 capsule (180 mg total) by mouth 2 (two) times daily. Please keep upcoming appt for future refills. Thank you 10/27/17   Thompson Grayer, MD  FLUoxetine (PROZAC) 40 MG capsule Take 40 mg by mouth daily.      [provider]  furosemide (LASIX) 20 MG tablet Take 2 tablets (40 mg total) by mouth daily. 05/31/18   Kathie Dike, MD  ipratropium-albuterol (DUONEB) 0.5-2.5 (3) MG/3ML SOLN Take 3 mLs by nebulization 3 (three) times daily. 05/31/18   Kathie Dike, MD  Multiple Vitamin (THEREMS PO) Take 1 tablet by mouth daily.    [provider]  pantoprazole (PROTONIX) 40 MG tablet Take 40 mg by mouth daily. 10/26/17   [provider]  potassium chloride SA (K-DUR,KLOR-CON) 20 MEQ tablet Take 20 mEq by mouth 2 (two) times daily.   08/31/17   [provider]  risperiDONE (RISPERDAL) 0.25 MG tablet Take 0.25 mg by mouth 2 (two) times daily as needed. 08/31/17   [provider]  topiramate (TOPAMAX) 50 MG tablet Take 1 tablet (50 mg total) by mouth 2 (two) times daily. 07/25/15   Penumalli, Earlean Polka, MD  traZODone (DESYREL) 50 MG tablet Take 50 mg by mouth at bedtime as needed. 08/31/17   [provider]    Physical Exam: Exam limited by patient's mental status. Vitals:   09/25/2019 1445 09/28/2019 1500 10/05/2019 1515 10/02/2019 1530  BP:  124/60  118/73  Pulse:      Resp: (!) 28 (!) 27 (!) 28 (!) 27  Temp:      TempSrc:      SpO2:      Weight:      Height:        Constitutional: Lethargic, opened his eyes Vitals:   09/22/2019 1445 10/05/2019 1500 10/03/2019 1515 10/01/2019 1530  BP:  124/60  118/73  Pulse:      Resp: (!) 28 (!) 27 (!) 28 (!) 27  Temp:      TempSrc:      SpO2:      Weight:      Height:       Eyes: Unable to fully evaluate due to altered mental status. ENMT: Unable to fully evaluate due to altered mental status Neck: normal, supple, no masses, no thyromegaly Respiratory: Normal respiratory effort. No accessory muscle use.  Cardiovascular: Tachycardic, irregular rate and rhythm, no murmurs / rubs / gallops. No extremity edema. 2+ pedal pulses.  Abdomen: no tenderness, no masses palpated. No hepatosplenomegaly. Bowel sounds positive.  Musculoskeletal: no clubbing / cyanosis. No joint deformity upper and lower extremities. Good ROM, no contractures. Normal muscle tone.  Skin: no rashes, lesions, ulcers. No induration Neurologic: Unable to evaluate due to altered mental status Psychiatric: Initially per ER provider unresponsive, with improvement in O2 sats, on my evaluation opened his eyes for a while later closed his eyes not responding verbally, following directions.  Labs on Admission: I have personally reviewed following labs and imaging studies  CBC: Recent Labs  Lab  09/15/2019 1405  WBC 12.1*  NEUTROABS 10.6*  HGB 15.1  HCT 54.3*  MCV 101.9*  PLT [id number]   Basic Metabolic Panel: Recent Labs  Lab 09/22/2019 1405  NA 175*  K 3.5  CL >130*  CO2 28  GLUCOSE 112*  BUN 97*  CREATININE 2.77*  CALCIUM 9.2   Liver Function Tests: Recent Labs  Lab 09/18/2019 1405  AST 32  ALT 26  ALKPHOS 52  BILITOT 0.7  PROT 7.1  ALBUMIN 2.9*   Coagulation Profile: Recent Labs  Lab 09/16/2019 1405  INR 1.2   Lipid Profile: Recent Labs    09/14/2019 1436  TRIG 140    Radiological Exams on Admission: DG Chest Port 1 View  Result Date: 10/11/2019 CLINICAL DATA:  COVID positive, altered mental status, shortness of breath EXAM: PORTABLE CHEST 1 VIEW COMPARISON:  03/16/2019 FINDINGS: Cardiomegaly status post median sternotomy and CABG with left chest multi lead pacer. Heterogeneous bilateral airspace opacity, most conspicuous in the right midlung. The visualized skeletal structures are unremarkable. IMPRESSION: Heterogeneous bilateral airspace opacities, most conspicuous in the right midlung, in keeping with reported diagnosis of COVID 19. Electronically Signed   By: Eddie Candle M.D.   On: 10/02/2019 14:59    EKG: Independently reviewed.  Atrial fibrillation, rate 175.  Assessment/Plan Principal Problem:   Pneumonia due to COVID-19 virus Active Problems:   CAD, NATIVE VESSEL   BRADYCARDIA-TACHYCARDIA SYNDROME   Persistent atrial fibrillation (HCC)   Hypertension   Dementia (HCC)   COPD (chronic obstructive pulmonary disease) (HCC)   Hypernatremia   AKI (acute kidney injury) ([place])    Pneumonia due to COVID-19 virus with Acute respiratory failure- O2 sats initially down to 54% on nonrebreather, initially placed on BiPAP then discontinued.  O2 sats currently greater than 94% on 4 L O2 . POC test positive in the ED.  From nursing home.  Chest x-ray showed heterogeneous bilateral airspace opacities, most conspicuous in the right midlung.  Patient is  critically ill with multiorgan dysfunction.  WBC 12.1.  With tachycardia and tachypnea meeting SIRS criteria lactic acid 1.9.  Elevated procalcitonin 3.24-suggesting bacterial coinfection.  With tachycardia and tachypnea near ruling in for sepsis.  Elevated inflammatory markers. 125 mg Solu-Medrol and then 10 mg dexamethasone given in ED. EDP talked to family initial plans for comfort care and admit here, but when I talked to family-spouse 2 daughters and son they are not quite ready for comfort care, want to continue treatments but admit here, considering poor prognosis -Remdesivir per pharm -Continue dexamethasone 6 mg daily -N.p.o. -Continue IV vancomycin, aztreonam and metronidazole (penicillin allergy noted details unknown) -Follow-up blood and urine cultures -Check inflammatory markers in a.m. - Albuterol and Symbicort inhalers when able  Hypernatremia- sodium 175.  Likely chronic.  Blood pressure systolic [id number].  - 1 L bolus normal saline given in ED, repeat 550ml bolus for volume then Continue D5 100cc/hr  -Goal to reduce sodium by 12 mEq in 24 hours. -Serial BMP  Atrial fibrillation with RVR- rates up to 170s.  History of permanent atrial fibrillation on chronic anticoagulation.  Follows with Dr. Marlou Porch, failed cardioversion x3, failed amiodarone. -Hold home Eliquis while n.p.o, resume when and if able. -Improvement in rates with 10 mg bolus and Cardizem drip, continue -Hold home Cardizem XR to 40 mg daily  Acute kidney injury-creatinine 2.77, baseline 0.8-1.  Likely prerenal, considering significant hypernatremia, and in the setting of Lasix use. -Hold home Lasix -Hydrate -BMP a.m.  Metabolic encephalopathy-multifactorial severe hypoxia, COVID-19 pneumonia, severe hypernatremia, acute kidney injury, unlikely sepsis from bacterial  coinfection.  Baseline mild dementia. -IV antibiotics -IV fluids -Hold home psychoactive medications- Wellbutrin, topiramate, trazodone, risperidone,  Prozac altered mental status   History of COPD-with acute respiratory failure. -As needed bronchodilators-albuterol inhaler when able  Hypertension-blood pressure soft. -On Cardizem drip -Hold home Lasix.  40 mg daily, p.o. Cardizem to 40 mg daily  Depression, dementia-per family can hold simple conversations at baseline, awake and alert.Marland Kitchen  Described as mild. -Hold home Wellbutrin, topiramate, trazodone, risperidone, Prozac while n.p.o and with altered mental status  History of tachycardia-bradycardia syndrome-pacemaker status.  Coronary artery disease-status post cardiac bypass 2003.  Follows with Dr. Marlou Porch. -Hold home statins, Eliquis.   DVT prophylaxis: Lovenox Code Status: DNR confirmed with spouse, 2 daughters and son. Family Communication: Talked to spouse- [name] in the ED with patient's 2 daughters and son.  Severity of illness, multiple organ dysfunction, significant comorbidities and poor prognosis explained to family.  At this time they want to continue with treatments here.  No plans for any aggressive measures.  I have explained we do not use BiPAP on patients with Covid pneumonia.  They understand that his O2 sats may drop on nasal cannula or nonrebreather, and will not intubate or use BiPAP.  Spouse and family voiced understanding.  Patient's 2 daughters are nurses. Disposition Plan: Per rounding team Consults called: None Admission status: Inpatient, stepdown I certify that at the point of admission it is my clinical judgment that the patient will require inpatient hospital care spanning beyond 2 midnights from the point of admission due to high intensity of service, high risk for further deterioration and high frequency of surveillance required. The following factors support the patient status of inpatient: Critically ill patient with multiorgan dysfunction requiring stepdown level of care.  Bethena Roys MD [facility]  09/21/2019, 8:13 PM

## 2019-09-23 NOTE — ED Notes (Signed)
[facility] notified of positive covid.

## 2019-09-23 NOTE — Progress Notes (Signed)
Notified bedside nurse of need to administer antibiotics.  

## 2019-09-23 NOTE — ED Provider Notes (Signed)
[facility] EMERGENCY DEPARTMENT Provider Note   CSN: [id number] Arrival date & time: 09/13/2019  1328     History Chief Complaint  Patient presents with  . Shortness of Breath    Zachary Warren is a 85 y.o. male.  Level 5 caveat for acuity of condition respiratory distress.  Patient brought in by EMS in extremis with hypoxia and respiratory distress and difficulty breathing.  Reportedly has been going on for several days.  Level 5 caveat, patient nonverbal with dementia not able to speak.  History of previous CVA, dementia, hypertension, hyperlipidemia, sleep apnea.  Facility reports hypoxia to the 50s with increased difficulty breathing, tachycardia, fever.  Concern for coronavirus.  Blood pressure 118/85, pulse 159, temperature 102, 88% on nonrebreather.  The history is provided by the patient, a relative and the EMS personnel. The history is limited by the condition of the patient.  Shortness of Breath      Past Medical History:  Diagnosis Date  . Atypical nevus 04/26/1990   left forearm (moderate) tx w/s  . Atypical nevus 02/24/2000   1. left forearm, ulnar (moderate), 2. left forearm radial (moderate) tx w/s  . Atypical nevus 04/16/2000   left forearm radial tx Dr [name] Rile  . Atypical nevus 09/08/2014   right back moderate   . BCC (basal cell carcinoma of skin) 05/25/2005   bcc left upper forehead tx [facility] Dr Vida Rigger  . BCC (basal cell carcinoma of skin) 12/09/2010   bcc ear tx cx3 65fu  . BCC (basal cell carcinoma of skin) 07/20/2012   bcc left neck tx curet cautery   . BCC (basal cell carcinoma of skin) 03/13/2014   bcc left eyelid tx MOHS  . BCC (basal cell carcinoma of skin) 05/11/2017   bcc ulc. nod. left temple tx letter mailed to patient surg needed  . COPD (chronic obstructive pulmonary disease) ([place])   . Coronary artery disease    s/p 5v CABG 2002  . CVA (cerebral infarction) 1999  . DDD (degenerative disc disease), cervical   . Dementia ([place])   .  Depression   . GERD (gastroesophageal reflux disease)   . History of kidney stones   . Hyperlipidemia   . Hypertension   . Memory impairment   . OSA (obstructive sleep apnea)    Recently diagnosed though pt. has not had CPAP trial  . Permanent atrial fibrillation    chads2vasc score is 6  . Pneumonia    recent hospital admission for pneumonia   . Prostate cancer ([place]) 2006   s/p XRT  . PVD (peripheral vascular disease) ([place])   . SCCA (squamous cell carcinoma) of skin 05/25/2005   scc in situ left hand tx cx3 65fu  . SCCA (squamous cell carcinoma) of skin 05/25/2005   scc in situ left ear rim post tx cx3 56fu  . SCCA (squamous cell carcinoma) of skin 02/12/2016   scc in situ right hand tx curet cautery  . Seizures ([place])   . Tachycardia-bradycardia [facility])    s/p PPM by [facility] 6/12    Patient Active Problem List   Diagnosis Date Noted  . Tachycardia-bradycardia ([place])   . Seizures ([place])   . PVD (peripheral vascular disease) ([place])   . Pneumonia   . Persistent atrial fibrillation ([place])   . Pacemaker   . OSA (obstructive sleep apnea)   . Memory impairment   . Hypertension   . History of kidney stones   . Depression   . Dementia ([place])   .  DDD (degenerative disc disease), cervical   . Coronary artery disease   . COPD (chronic obstructive pulmonary disease) ([place])   . LLL pneumonia 07/11/2017  . HCAP (healthcare-associated pneumonia) 07/11/2017  . Fever 07/10/2017  . Hypokalemia 10/25/2014  . MCI (mild cognitive impairment) with memory loss 07/24/2014  . Cardiac pacemaker in situ 12/25/2013  . Aneurysm ([place]) 06/28/2012  . CVA (cerebral infarction) 02/05/2012  . Dysphagia as late effect of stroke 02/05/2012  . GERD (gastroesophageal reflux disease) 02/05/2012  . 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 Hyperlipidemia 11/21/2010  . Essential hypertension 11/21/2010  . CAD, NATIVE VESSEL 11/21/2010  . Prostate cancer ([place]) 10/12/2004    Past Surgical History:  Procedure  Laterality Date  . CARDIOVERSION N/A 08/21/2013   Procedure: CARDIOVERSION;  Surgeon: Candee Furbish, MD;  Location: [place];  Service: Cardiovascular;  Laterality: N/A;  . [place]  . CORONARY ARTERY BYPASS GRAFT  2002  . CYSTOSCOPY W/ URETERAL STENT PLACEMENT Left 05/13/2017   Procedure: CYSTOSCOPY WITH RETROGRADE PYELOGRAM/URETERAL STENT PLACEMENT LEFT;  Surgeon: Alexis Frock, MD;  Location: [facility];  Service: Urology;  Laterality: Left;  . CYSTOSCOPY WITH RETROGRADE PYELOGRAM, URETEROSCOPY AND STENT PLACEMENT Left 06/09/2017   Procedure: CYSTOSCOPY WITH RETROGRADE PYELOGRAM, URETEROSCOPY;  Surgeon: Alexis Frock, MD;  Location: [facility];  Service: Urology;  Laterality: Left;  . HOLMIUM LASER APPLICATION Left [id number]   Procedure: HOLMIUM LASER APPLICATION;  Surgeon: Alexis Frock, MD;  Location: [facility];  Service: Urology;  Laterality: Left;  . INSERT / REPLACE / REMOVE PACEMAKER    . NECK SURGERY     hx of   . PACEMAKER INSERTION     implanted by JA 6/12       Family History  Problem Relation Age of Onset  . Cancer Mother        ovarian  . Ovarian cancer Mother   . Cancer Father        pancreatic  . Pancreatic cancer Father   . Cancer Brother        lung  . Ovarian cancer Other   . Pancreatic cancer Other   . Lung cancer Other     Social History   Tobacco Use  . Smoking status: Former Smoker    Packs/day: 2.00    Years: 48.00    Pack years: 96.00    Types: Cigarettes    Quit date: 03/09/1998    Years since quitting: 21.5  . Smokeless tobacco: Never Used  Substance Use Topics  . Alcohol use: No    Comment: quit: 1999  . Drug use: No    Home Medications Prior to Admission medications   Medication Sig Start Date End Date Taking? Authorizing Provider  acetaminophen (TYLENOL) 325 MG tablet Take 500 mg by mouth every 6 (six) hours as needed.    [provider]  apixaban (ELIQUIS) 5 MG TABS tablet Take 1 tablet (5 mg total) by mouth 2 (two) times  daily. 10/27/17   Allred, Jeneen Rinks, MD  atorvastatin (LIPITOR) 40 MG tablet TAKE 1 TABLET BY MOUTH EVERY DAY - PATIENT DUE FOR LIPID PANEL 06/01/17   Jerline Pain, MD  budesonide-formoterol [facility]) 80-4.5 MCG/ACT inhaler Inhale 2 puffs into the lungs daily.    [provider]  buPROPion (WELLBUTRIN) 75 MG tablet Take 75 mg by mouth 2 (two) times daily. 08/31/17   [provider]  calcium carbonate (TUMS - DOSED IN MG ELEMENTAL CALCIUM) 500 MG chewable tablet Chew 1 tablet by mouth 2 (two) times  daily.    [provider]  cholecalciferol (VITAMIN D) 1000 units tablet Take 1,000 Units by mouth daily.    [provider]  diltiazem (CARTIA XT) 180 MG 24 hr capsule Take 1 capsule (180 mg total) by mouth 2 (two) times daily. Please keep upcoming appt for future refills. Thank you 10/27/17   Thompson Grayer, MD  FLUoxetine (PROZAC) 40 MG capsule Take 40 mg by mouth daily.      [provider]  furosemide (LASIX) 20 MG tablet Take 2 tablets (40 mg total) by mouth daily. 05/31/18   Kathie Dike, MD  ipratropium-albuterol (DUONEB) 0.5-2.5 (3) MG/3ML SOLN Take 3 mLs by nebulization 3 (three) times daily. 05/31/18   Kathie Dike, MD  Multiple Vitamin (THEREMS PO) Take 1 tablet by mouth daily.    [provider]  pantoprazole (PROTONIX) 40 MG tablet Take 40 mg by mouth daily. 10/26/17   [provider]  potassium chloride SA (K-DUR,KLOR-CON) 20 MEQ tablet Take 20 mEq by mouth 2 (two) times daily.  08/31/17   [provider]  risperiDONE (RISPERDAL) 0.25 MG tablet Take 0.25 mg by mouth 2 (two) times daily as needed. 08/31/17   [provider]  topiramate (TOPAMAX) 50 MG tablet Take 1 tablet (50 mg total) by mouth 2 (two) times daily. 07/25/15   Penumalli, Earlean Polka, MD  traZODone (DESYREL) 50 MG tablet Take 50 mg by mouth at bedtime as needed. 08/31/17   [provider]    Allergies    Aricept Reather Littler hcl], Ceclor  [cefaclor], Namenda [memantine hcl], Penicillins, and Ramipril  Review of Systems   Review of Systems  Unable to perform ROS: Acuity of condition  Respiratory: Positive for shortness of breath.     Physical Exam Updated Vital Signs BP (!) 107/94   Pulse 89   Temp (!) 102.8 F (39.3 C) (Rectal)   Resp (!) 27   Ht 6' (1.829 m)   Wt 77 kg   SpO2 100%   BMI 23.02 kg/m   Physical Exam Constitutional:      General: He is in acute distress.     Appearance: He is well-developed. He is ill-appearing and toxic-appearing.     Comments: Severe respiratory distress, toxic, unable to speak  HENT:     Mouth/Throat:     Mouth: Mucous membranes are dry.  Cardiovascular:     Rate and Rhythm: Tachycardia present. Rhythm irregular.     Comments: irRegular tachycardia in the 170s Pulmonary:     Effort: Respiratory distress present.     Breath sounds: Rhonchi present.  Musculoskeletal:        General: No swelling or tenderness.  Skin:    General: Skin is warm.     Findings: No rash.  Neurological:     Mental Status: He is alert.     Comments: Unresponsive, does not speak or follow commands     ED Results / Procedures / Treatments   Labs (all labs ordered are listed, but only abnormal results are displayed) Labs Reviewed  CBC WITH DIFFERENTIAL/PLATELET - Abnormal; Notable for the following components:      Result Value   WBC 12.1 (*)    HCT 54.3 (*)    MCV 101.9 (*)    MCHC 27.8 (*)    RDW 16.3 (*)    Neutro Abs 10.6 (*)    Abs Immature Granulocytes 0.49 (*)    All other components within normal limits  COMPREHENSIVE METABOLIC PANEL - Abnormal; Notable  for the following components:   Sodium 175 (*)    Chloride >130 (*)    Glucose, Bld 112 (*)    BUN 97 (*)    Creatinine, Ser 2.77 (*)    Albumin 2.9 (*)    GFR calc non Af Amer 20 (*)    GFR calc Af Amer 23 (*)    All other components within normal limits  D-DIMER, QUANTITATIVE (NOT AT [facility]) - Abnormal; Notable for the  following components:   D-Dimer, Quant 2.65 (*)    All other components within normal limits  LACTATE DEHYDROGENASE - Abnormal; Notable for the following components:   LDH 284 (*)    All other components within normal limits  FERRITIN - Abnormal; Notable for the following components:   Ferritin 467 (*)    All other components within normal limits  FIBRINOGEN - Abnormal; Notable for the following components:   Fibrinogen 718 (*)    All other components within normal limits  C-REACTIVE PROTEIN - Abnormal; Notable for the following components:   CRP 25.8 (*)    All other components within normal limits  APTT - Abnormal; Notable for the following components:   aPTT 40 (*)    All other components within normal limits  PROTIME-INR - Abnormal; Notable for the following components:   Prothrombin Time 15.4 (*)    All other components within normal limits  URINALYSIS, ROUTINE W REFLEX MICROSCOPIC - Abnormal; Notable for the following components:   APPearance CLOUDY (*)    Hgb urine dipstick MODERATE (*)    Protein, ur 30 (*)    Bacteria, UA RARE (*)    All other components within normal limits  BRAIN NATRIURETIC PEPTIDE - Abnormal; Notable for the following components:   B Natriuretic Peptide 605.0 (*)    All other components within normal limits  POC SARS CORONAVIRUS 2 AG -  ED - Abnormal; Notable for the following components:   SARS Coronavirus 2 Ag POSITIVE (*)    All other components within normal limits  CULTURE, BLOOD (ROUTINE X 2)  CULTURE, BLOOD (ROUTINE X 2)  URINE CULTURE  BLOOD GAS, ARTERIAL  LACTIC ACID, PLASMA  PROCALCITONIN  TRIGLYCERIDES  LACTIC ACID, PLASMA    EKG EKG Interpretation  Date/Time:  Saturday September 23 2019 14:19:24 EST Ventricular Rate:  175 PR Interval:    QRS Duration: 86 QT Interval:  269 QTC Calculation: 459 R Axis:   81 Text Interpretation: Atrial fibrillation with rapid V-rate Low voltage, extremity leads Repolarization abnormality, prob  rate related Rate faster Confirmed by Ezequiel Essex [phone]) on 10/02/2019 3:46:21 PM   Radiology DG Chest Port 1 View  Result Date: 10/07/2019 CLINICAL DATA:  COVID positive, altered mental status, shortness of breath EXAM: PORTABLE CHEST 1 VIEW COMPARISON:  03/16/2019 FINDINGS: Cardiomegaly status post median sternotomy and CABG with left chest multi lead pacer. Heterogeneous bilateral airspace opacity, most conspicuous in the right midlung. The visualized skeletal structures are unremarkable. IMPRESSION: Heterogeneous bilateral airspace opacities, most conspicuous in the right midlung, in keeping with reported diagnosis of COVID 19. Electronically Signed   By: Eddie Candle M.D.   On: 10/05/2019 14:59    Procedures .Critical Care Performed by: Ezequiel Essex, MD Authorized by: Ezequiel Essex, MD   Critical care provider statement:    Critical care time (minutes):  45   Critical care was necessary to treat or prevent imminent or life-threatening deterioration of the following conditions:  Sepsis and respiratory failure   Critical care was time  spent personally by me on the following activities:  Discussions with consultants, evaluation of patient's response to treatment, examination of patient, ordering and performing treatments and interventions, ordering and review of laboratory studies, ordering and review of radiographic studies, pulse oximetry, re-evaluation of patient's condition, obtaining history from patient or surrogate and review of old charts   (including critical care time)  Medications Ordered in ED Medications  aztreonam (AZACTAM) 2 g in sodium chloride 0.9 % 100 mL IVPB (has no administration in time range)  metroNIDAZOLE (FLAGYL) IVPB 500 mg (has no administration in time range)  vancomycin (VANCOCIN) IVPB 1000 mg/200 mL premix (has no administration in time range)  sodium chloride 0.9 % bolus 1,000 mL (has no administration in time range)    ED Course  I have  reviewed the triage vital signs and the nursing notes.  Pertinent labs & imaging results that were available during my care of the patient were reviewed by me and considered in my medical decision making (see chart for details).    MDM Rules/Calculators/A&P     CHA2DS2/VAS Stroke Risk Points  Current as of 8 minutes ago     6 >= 2 Points: High Risk  1 - 1.99 Points: Medium Risk  0 Points: Low Risk    The patient's score has not changed in the past year.: No Change     Details    This score determines the patient's risk of having a stroke if the  patient has atrial fibrillation.       Points Metrics  1 Has Congestive Heart Failure:  Yes    Current as of 8 minutes ago  1 Has Vascular Disease:  Yes    Current as of 8 minutes ago  1 Has Hypertension:  Yes    Current as of 8 minutes ago  2 Age:  71    Current as of 8 minutes ago  1 Has Diabetes:  Yes     Current as of 8 minutes ago  0 Had Stroke:  No  Had TIA:  No  Had thromboembolism:  No    Current as of 8 minutes ago  0 Male:  No    Current as of 8 minutes ago                         Patient from facility in extremis with hypoxia, tachycardia, fever, shortness of breath.  Does have DNR in place.  Patient was hypoxic on nonrebreather on arrival and switched to BiPAP before my evaluation during inpatient code blue..  Concern for coronavirus and placed on close circuit.  Code sepsis activated, given broad-spectrum antibiotics and IV fluids.  Next of kin attempted to contact patient's wife [name].  There was no answer, attempted to contact daughter Regan Rakers there was no answer.  Did contact patient's son Roderic Sayer.  He confirms the patient is DNR and DNI.  Discussed with the patient was critically ill and will likely not survive this illness.  Covid test is positive.  Patient with hypoxia, fever, tachypnea.  Given judicious IV fluids, IV antibiotics and code sepsis was activated. IV steroids.   Chest x-ray  consistent with Covid diagnosis.  Family at bedside agrees with DNR/DNI and is focused on comfort care.  They do not want any aggressive measures.  They are agreeable to IV fluids and IV antibiotics.  Pilar Plate discussion with patient's wife [name] that he would likely not survive this illness.  She understands and agrees that patient has made his wishes known would not want any aggressive measures or aggressive life support.  Labs consistent with hypernatremia and hypochloremia and AKI. Cardizem gtt started for A fib with RVR.   Patient will be weaned off bipap and back to nonrebreather as able.  Admission for comfort care d/w Dr. Denton Brick.  AAQIB FREUDENBERG was evaluated in Emergency Department on 09/16/2019 for the symptoms described in the history of present illness. He was evaluated in the context of the global COVID-19 pandemic, which necessitated consideration that the patient might be at risk for infection with the SARS-CoV-2 virus that causes COVID-19. Institutional protocols and algorithms that pertain to the evaluation of patients at risk for COVID-19 are in a state of rapid change based on information released by regulatory bodies including the CDC and federal and state organizations. These policies and algorithms were followed during the patient's care in the ED.  Final Clinical Impression(s) / ED Diagnoses Final diagnoses:  COVID-19 virus infection  Acute respiratory failure with hypoxia [facility])    Rx / DC Orders ED Discharge Orders    None       Makensie Mulhall, Annie Main, MD 10/07/2019 1805

## 2019-09-23 NOTE — ED Notes (Signed)
Date and time results received: 09/17/2019 2:52 PM  (use smartphrase ".now" to insert current time)  Test: Cl Critical Value: >130  Name of Provider Notified: Rancour  Orders Received? Or Actions Taken?: Orders Received - See Orders for details

## 2019-09-23 NOTE — Progress Notes (Signed)
Pharmacy Antibiotic Note  Zachary Warren is a 85 y.o. male admitted on 09/24/2019 with unknown source.  Pharmacy has been consulted for Vancomycin and aztreonam dosing.  Plan: Vancomycin 1500mg  loading dose, then 750mg  IV every 24 hours.  Goal trough 15-20 mcg/mL.  Aztreonam 2gm loading dose, then 1gm IV q8h F/U cxs and clinical progress Monitor V/S, labs and levels as indicated  Height: 6' (182.9 cm) Weight: 169 lb 12.1 oz (77 kg) IBW/kg (Calculated) : 77.6  Temp (24hrs), Avg:102.8 F (39.3 C), Min:102.8 F (39.3 C), Max:102.8 F (39.3 C)  Recent Labs  Lab 09/14/2019 1405  CREATININE 2.77*  LATICACIDVEN 1.9    Estimated Creatinine Clearance: 21.6 mL/min (A) (by C-G formula based on SCr of 2.77 mg/dL (H)).    Allergies  Allergen Reactions  . Aricept [Donepezil Hcl] Other (See Comments)    "stomach pain", generic  . Ceclor [Cefaclor]   . Namenda [Memantine Hcl] Other (See Comments)    "dizzy", brand medication  . Penicillins     Has patient had a PCN reaction causing immediate rash, facial/tongue/throat swelling, SOB or lightheadedness with hypotension: Unknown Has patient had a PCN reaction causing severe rash involving mucus membranes or skin necrosis: Unknown Has patient had a PCN reaction that required hospitalization: Unknown Has patient had a PCN reaction occurring within the last 10 years: Unknown If all of the above answers are "NO", then may proceed with Cephalosporin use.   . Ramipril Other (See Comments)     cough    Antimicrobials this admission: Vancomycin 12/12 >>  Aztreonam 12/12 >>   Microbiology results: 12/12 BCx: pending 12/12 UCx: pending 12/12 SARS-2 CV pending  MRSA PCR:   Thank you for allowing pharmacy to be a part of this patient's care.  Isac Sarna, BS [place], [place] Clinical Pharmacist Pager [phone] 09/16/2019 2:56 PM

## 2019-09-23 NOTE — ED Notes (Signed)
Date and time results received: 09/24/2019 22:22 (use smartphrase ".now" to insert current time)  Test: sodium 172, chloride >130 Critical Value:   Name of Provider Notified: Dr Denton Brick  Orders Received? Or Actions Taken?: see emr

## 2019-09-23 NOTE — ED Notes (Signed)
Date and time results received: 09/22/2019 2:52 PM (use smartphrase ".now" to insert current time)  Test: Sodium Critical Value: 175  Name of Provider Notified: Rancour  Orders Received? Or Actions Taken?: Orders Received - See Orders for details

## 2019-09-24 DIAGNOSIS — N179 Acute kidney failure, unspecified: Secondary | ICD-10-CM

## 2019-09-24 DIAGNOSIS — J449 Chronic obstructive pulmonary disease, unspecified: Secondary | ICD-10-CM

## 2019-09-24 DIAGNOSIS — J1289 Other viral pneumonia: Secondary | ICD-10-CM

## 2019-09-24 DIAGNOSIS — G309 Alzheimer's disease, unspecified: Secondary | ICD-10-CM

## 2019-09-24 DIAGNOSIS — F0281 Dementia in other diseases classified elsewhere with behavioral disturbance: Secondary | ICD-10-CM

## 2019-09-24 DIAGNOSIS — A419 Sepsis, unspecified organism: Secondary | ICD-10-CM

## 2019-09-24 DIAGNOSIS — I4819 Other persistent atrial fibrillation: Secondary | ICD-10-CM

## 2019-09-24 DIAGNOSIS — L89156 Pressure-induced deep tissue damage of sacral region: Secondary | ICD-10-CM

## 2019-09-24 DIAGNOSIS — L8990 Pressure ulcer of unspecified site, unspecified stage: Secondary | ICD-10-CM | POA: Insufficient documentation

## 2019-09-24 DIAGNOSIS — R6520 Severe sepsis without septic shock: Secondary | ICD-10-CM

## 2019-09-24 DIAGNOSIS — E870 Hyperosmolality and hypernatremia: Secondary | ICD-10-CM

## 2019-09-24 DIAGNOSIS — U071 COVID-19: Secondary | ICD-10-CM

## 2019-09-24 LAB — COMPREHENSIVE METABOLIC PANEL
ALT: 22 U/L (ref 0–44)
AST: 21 U/L (ref 15–41)
Albumin: 2.2 g/dL — ABNORMAL LOW (ref 3.5–5.0)
Alkaline Phosphatase: 42 U/L (ref 38–126)
BUN: 99 mg/dL — ABNORMAL HIGH (ref 8–23)
CO2: 24 mmol/L (ref 22–32)
Calcium: 8 mg/dL — ABNORMAL LOW (ref 8.9–10.3)
Chloride: >130 mmol/L (ref 98–111)
Creatinine, Ser: 2.4 mg/dL — ABNORMAL HIGH (ref 0.61–1.24)
GFR calc Af Amer: 28 mL/min — ABNORMAL LOW (ref >60)
GFR calc non Af Amer: 24 mL/min — ABNORMAL LOW (ref >60)
Glucose, Bld: 349 mg/dL — ABNORMAL HIGH (ref 70–99)
Potassium: 3.2 mmol/L — ABNORMAL LOW (ref 3.5–5.1)
Sodium: 170 mmol/L (ref 135–145)
Total Bilirubin: 0.6 mg/dL (ref 0.3–1.2)
Total Protein: 5.7 g/dL — ABNORMAL LOW (ref 6.5–8.1)

## 2019-09-24 LAB — CBC WITH DIFFERENTIAL/PLATELET
Abs Immature Granulocytes: 0.03 10*3/uL (ref 0.00–0.07)
Basophils Absolute: 0 10*3/uL (ref 0.0–0.1)
Basophils Relative: 0 %
Eosinophils Absolute: 0 10*3/uL (ref 0.0–0.5)
Eosinophils Relative: 0 %
HCT: 46.3 % (ref 39.0–52.0)
Hemoglobin: 12.4 g/dL — ABNORMAL LOW (ref 13.0–17.0)
Immature Granulocytes: 0 %
Lymphocytes Relative: 4 %
Lymphs Abs: 0.4 10*3/uL — ABNORMAL LOW (ref 0.7–4.0)
MCH: 28.1 pg (ref 26.0–34.0)
MCHC: 26.8 g/dL — ABNORMAL LOW (ref 30.0–36.0)
MCV: 105 fL — ABNORMAL HIGH (ref 80.0–100.0)
Monocytes Absolute: 0.1 10*3/uL (ref 0.1–1.0)
Monocytes Relative: 1 %
Neutro Abs: 7.6 10*3/uL (ref 1.7–7.7)
Neutrophils Relative %: 95 %
Platelets: 122 10*3/uL — ABNORMAL LOW (ref 150–400)
RBC: 4.41 MIL/uL (ref 4.22–5.81)
RDW: 16.3 % — ABNORMAL HIGH (ref 11.5–15.5)
WBC Morphology: INCREASED
WBC: 8.1 10*3/uL (ref 4.0–10.5)
nRBC: 0 % (ref 0.0–0.2)

## 2019-09-24 LAB — D-DIMER, QUANTITATIVE: D-Dimer, Quant: 2.45 ug/mL-FEU — ABNORMAL HIGH (ref 0.00–0.50)

## 2019-09-24 LAB — C-REACTIVE PROTEIN: CRP: 22.5 mg/dL — ABNORMAL HIGH (ref <1.0)

## 2019-09-24 LAB — ABO/RH: ABO/RH(D): A POS

## 2019-09-24 LAB — FERRITIN: Ferritin: 459 ng/mL — ABNORMAL HIGH (ref 24–336)

## 2019-09-24 LAB — MRSA PCR SCREENING: MRSA by PCR: POSITIVE — AB

## 2019-09-24 MED ORDER — ORAL CARE MOUTH RINSE
15.0000 mL | Freq: Two times a day (BID) | OROMUCOSAL | Status: DC
  Administered 2019-09-24 – 2019-09-26 (×5): 15 mL via OROMUCOSAL

## 2019-09-24 MED ORDER — FAMOTIDINE IN NACL 20-0.9 MG/50ML-% IV SOLN
20.0000 mg | INTRAVENOUS | Status: DC
  Administered 2019-09-24: 20 mg via INTRAVENOUS
  Filled 2019-09-24: qty 50

## 2019-09-24 MED ORDER — MAGNESIUM SULFATE IN D5W 1-5 GM/100ML-% IV SOLN
1.0000 g | Freq: Once | INTRAVENOUS | Status: AC
  Administered 2019-09-24: 12:00:00 1 g via INTRAVENOUS
  Filled 2019-09-24: qty 100

## 2019-09-24 MED ORDER — POTASSIUM CL IN DEXTROSE 5% 20 MEQ/L IV SOLN
20.0000 meq | INTRAVENOUS | Status: DC
  Administered 2019-09-24 – 2019-09-25 (×3): 20 meq via INTRAVENOUS
  Filled 2019-09-24 (×7): qty 1000

## 2019-09-24 MED ORDER — MUPIROCIN 2 % EX OINT
TOPICAL_OINTMENT | Freq: Two times a day (BID) | CUTANEOUS | Status: DC
  Administered 2019-09-24: 22:00:00 via NASAL
  Administered 2019-09-24: 1 via NASAL
  Administered 2019-09-25 (×2): via NASAL
  Filled 2019-09-24 (×2): qty 22

## 2019-09-24 MED ORDER — SCOPOLAMINE 1 MG/3DAYS TD PT72
1.0000 | MEDICATED_PATCH | TRANSDERMAL | Status: DC
  Administered 2019-09-24 – 2019-09-27 (×2): 1.5 mg via TRANSDERMAL
  Filled 2019-09-24 (×4): qty 1

## 2019-09-24 MED ORDER — ORAL CARE MOUTH RINSE: 15.0000 mL | Freq: Two times a day (BID) | OROMUCOSAL | Status: DC

## 2019-09-24 MED ORDER — CHLORHEXIDINE GLUCONATE CLOTH 2 % EX PADS
6.0000 | MEDICATED_PAD | Freq: Every day | CUTANEOUS | Status: DC
  Administered 2019-09-24 – 2019-09-26 (×3): 6 via TOPICAL

## 2019-09-24 NOTE — Progress Notes (Signed)
Dr Dyann Kief made aware of positive MRSA of nares result

## 2019-09-24 NOTE — Progress Notes (Signed)
CRITICAL VALUE ALERT  Critical Value:  NA 170 Date & Time Notied:  09/24/19  0645 Provider Notified: Darrick Meigs  Orders Received/Actions taken: awaiting response/orders

## 2019-09-24 NOTE — Progress Notes (Signed)
MRSA positive called from lab

## 2019-09-24 NOTE — ED Notes (Signed)
Notified pt's wife- Jadavion Ferretiz that pt was being transferred to ICU bed 08.

## 2019-09-24 NOTE — Progress Notes (Signed)
PROGRESS NOTE    Zachary Warren  XMI:[id number] DOB: 1933-05-24 DOA: 09/19/2019 PCP: [name], NP     Brief Narrative:  85 y.o. male with medical history significant for dementia, COPD, atrial fibrillation, bradycardia tachycardia syndrome, coronary artery disease, hypertension, seizures.  Patient was brought to the ED from nursing home with reports of altered mental status, difficulty breathing, and low oxygen levels.  On initial evaluation patient was unresponsive, with time of my evaluation patient opened his eyes but was not verbally answering questions or following directions.  History is obtained from chart review and talking to family spouse 2 daughters and son. At baseline patient is able to awake and alert, able to communicate, answers questions appropriately, he has mild dementia. The first liter reported hypoxia O2 sats in the 50s, with tachycardia and fevers.  ED Course: Temperature 102.8, tachycardic to 179, O2 sats 54% on nonrebreather mask, subsequently switched to BiPAP- [employer]-this was later discontinued.  Markedly elevated sodium at 175, chloride greater than 130, creatinine elevated 2.77, WBC 12.1.  Lactic acid 1.9.  Procalcitonin elevated at 3.24.  Portable chest x-ray heterogeneous bilateral airspace opacities most conspicuous in the right mid lung in keeping with reported diagnosis of COVID-19.  Patient was started on IV vancomycin, metronidazole and aztreonam.  1 L bolus normal saline given.  Cardizem 15m bolus given, and GTT initiated, 10 mg dexamethasone given. EDP talked to patient's wife and son decision was made to make patient comfort care.  Assessment & Plan: 1-acute respiratory failure with hypoxia in the setting of Covid 19 pneumonia and HCAP/aspiration. -Patient with positive MRSA screening -Continue current IV antibiotics -Continue oxygen supplementation and follow culture results. -Continue Decadron and remdesivir -Significant elevation of  his CRP and other inflammatory markers -Family has pressed no further invasive management. -Not a candidate for Actemra with suspected underlying bacterial infection. -continue to treat what is treatable and follow response -palliative care consultation requested.  2-sepsis in the setting of COVID-19 infection and pneumonia (HCAP/aspiration) -As mentioned above continue remdesivir, steroids and broad-spectrum antibiotics. -Continue supportive care -Continue IV fluids. -Patient met sepsis criteria on presentation with elevated respiratory rate, fever, elevated heart rate and demonstrated source of infection to be his lungs from bacterial bilateral pneumonia. -Patient has organ dysfunction with encephalopathy and acute renal failure.  3-acute metabolic encephalopathy -In the setting of acute infection and abnormal electrolytes -Continue supportive care -Continue constant reorientation -Patient with underlying history of dementia.  4-hypernatremia -Continue D5W -Sodium 170 today -Most likely in the setting of severe dehydration continue use of diuretics prior to admission. -To follow electrolytes trend  5-Acute renal failure -Continue to avoid nephrotoxic agents -Pharmacy to assist adjusting patient medications dosages to his renal function. -Cr trending down with intervention   6-tachybradycardia syndrome -Status post pacemaker -Continue Cardizem drip -Continue Lovenox for DVT prophylaxis; patient prior to admission was on Eliquis but unable to take by mouth at this time.  7-increase oral secretion -Wl start a scopolamine patch -If able to improve mentation will have a speech therapy evaluation to assist with diet consistency.  8-pressure injury of the skin -Present prior to admission -Continue constant repositioning and preventive measures. -No open wounds.  9-dementia with depression -While n.p.o. continue holding Wellbutrin, Topamax, trazodone, Risperdal and  prozac -Continue constant reorientation and supportive care.  10- history of COPD -Currently no wheezing -Acute respiratory failure as mentioned above in the setting of pneumonia and COVID-19 infection -Continue as needed bronchodilators.  11-essential hypertension -Blood pressure soft  but stable currently -Maintain map above 65 -Continue Cardizem drip -Continue holding any other antihypertensive regimen.  62-HUTMLYY diastolic heart failure -Stable and compensated. -Continue to follow daily weights and strict I's and O's -Holding diuretics at this time.  13-DNR/DNI -patient with a prior to admission wishes to be DNR/DNI -Prognosis guarded -Palliative care followed by interactive contacted -Family in agreement to treat the treatable at this time.  DVT prophylaxis: Lovenox Code Status: DNR Family Communication: Wife. Disposition Plan: Remains inpatient, continue current treatment; start a scopolamine patch and involved palliative care service.  Extensive discussion with family members at this particular moment treating what is treatable respecting patient's wishes of DNR/DNI, they are still not ready for comfort care and symptomatic management only.  Consultants:   Palliative care  Procedures:   See below for x-ray results  Antimicrobials:  Anti-infectives (From admission, onward)   Start     Dose/Rate Route Frequency Ordered Stop   09/24/19 1600  vancomycin (VANCOCIN) IVPB 750 mg/150 ml premix     750 mg 150 mL/hr over 60 Minutes Intravenous Every 24 hours 09/20/2019 1502     09/24/19 1000  remdesivir 100 mg in sodium chloride 0.9 % 100 mL IVPB     100 mg 200 mL/hr over 30 Minutes Intravenous Daily 09/18/2019 2042 09/28/19 0959   10/09/2019 2300  metroNIDAZOLE (FLAGYL) IVPB 500 mg     500 mg 100 mL/hr over 60 Minutes Intravenous Every 8 hours 09/28/2019 1951     09/22/2019 2200  aztreonam (AZACTAM) 1 g in sodium chloride 0.9 % 100 mL IVPB     1 g 200 mL/hr over 30 Minutes  Intravenous Every 8 hours 09/22/2019 1502     10/05/2019 2000  remdesivir 200 mg in sodium chloride 0.9% 250 mL IVPB     200 mg 580 mL/hr over 30 Minutes Intravenous Once 10/07/2019 1945 10/04/2019 2321   10/06/2019 1515  vancomycin (VANCOCIN) 1,500 mg in sodium chloride 0.9 % 500 mL IVPB     1,500 mg 250 mL/hr over 120 Minutes Intravenous  Once 09/21/2019 1435 09/16/2019 1939   10/06/2019 1415  aztreonam (AZACTAM) 2 g in sodium chloride 0.9 % 100 mL IVPB     2 g 200 mL/hr over 30 Minutes Intravenous  Once 10/08/2019 1406 09/12/2019 1606   10/01/2019 1415  metroNIDAZOLE (FLAGYL) IVPB 500 mg     500 mg 100 mL/hr over 60 Minutes Intravenous  Once 09/15/2019 1406 10/12/2019 1606   10/05/2019 1415  vancomycin (VANCOCIN) IVPB 1000 mg/200 mL premix  Status:  Discontinued     1,000 mg 200 mL/hr over 60 Minutes Intravenous  Once 10/11/2019 1406 09/16/2019 1435       Subjective: Low-grade temperature overnight; still demonstrating significant respiratory distress and having uncontrollable atrial fibrillation.  Positive hypernatremia with a sodium in the 170s range and also having some difficulty clearing secretions in the back of his throat. Positive MRSA PCR.  Objective: Vitals:   09/24/19 0945 09/24/19 1015 09/24/19 1030 09/24/19 1100  BP: (!) 91/58 (!) 96/48 (!) 94/52 92/63  Pulse: (!) 41 (!) 113 (!) 123 (!) 34  Resp: (!) '8 16 19 13  ' Temp:      TempSrc:      SpO2: 93% 91% 92% 93%  Weight:      Height:        Intake/Output Summary (Last 24 hours) at 09/24/2019 1123 Last data filed at 09/24/2019 0400 Gross per 24 hour  Intake 2182.14 ml  Output 800 ml  Net  1382.14 ml   Filed Weights   10/10/2019 1432 09/22/2019 1435 09/24/19 0215  Weight: 77 kg 77 kg 66.5 kg    Examination: General exam: Low-grade temperature overnight; oriented x1 (person).  Chronically ill and very deconditioned on exam.  Patient is frail and demonstrating increased respiratory distress and some difficulty clearing secretions in the back of  her throat.  No nausea, no vomiting. Respiratory system: Diffuse rhonchi, mild use of accessory muscles, positive tachypnea, using 4-5 L nasal cannula supplementation.   Cardiovascular system: Irregularly irregular, positive systolic ejection murmur, no rubs, no gallops, no JVD.   Gastrointestinal system: Abdomen is nondistended, soft and nontender. No organomegaly or masses felt. Normal bowel sounds heard. Central nervous system: Difficult to assess with current acute encephalopathy on top of his underlying dementia; but demonstrated to be oriented x1, intermittently nodding to questions and spontaneously moving 4 limbs.   Extremities: No cyanosis or clubbing. Skin: No rashes, no petechiae. Patient with deep tissue injury affecting sacrum and right heel. Present prior to admission.  Psychiatry: Unable to properly assess secondary to acute encephalopathy.  Mood & affect appropriate.    Data Reviewed: I have personally reviewed following labs and imaging studies  CBC: Recent Labs  Lab 10/08/2019 1405 09/24/19 0530  WBC 12.1* 8.1  NEUTROABS 10.6* 7.6  HGB 15.1 12.4*  HCT 54.3* 46.3  MCV 101.9* 105.0*  PLT 164 378*   Basic Metabolic Panel: Recent Labs  Lab 10/09/2019 1405 10/02/2019 2121 09/24/19 0530  NA 175* 172* 170*  K 3.5 3.5 3.2*  CL >130* >130* >130*  CO2 '28 22 24  ' GLUCOSE 112* 174* 349*  BUN 97* 95* 99*  CREATININE 2.77* 2.51* 2.40*  CALCIUM 9.2 8.2* 8.0*   GFR: Estimated Creatinine Clearance: 21.6 mL/min (A) (by C-G formula based on SCr of 2.4 mg/dL (H)).   Liver Function Tests: Recent Labs  Lab 10/06/2019 1405 09/24/19 0530  AST 32 21  ALT 26 22  ALKPHOS 52 42  BILITOT 0.7 0.6  PROT 7.1 5.7*  ALBUMIN 2.9* 2.2*   Coagulation Profile: Recent Labs  Lab 10/05/2019 1405  INR 1.2   Lipid Profile: Recent Labs    09/17/2019 1436  TRIG 140   Anemia Panel: Recent Labs    10/04/2019 1436 09/24/19 0530  FERRITIN 467* 459*   Urine analysis:    Component Value  Date/Time   COLORURINE YELLOW 10/12/2019 1733   APPEARANCEUR CLOUDY (A) 10/10/2019 1733   LABSPEC 1.017 09/26/2019 1733   PHURINE 5.0 10/12/2019 1733   GLUCOSEU NEGATIVE 09/20/2019 1733   HGBUR MODERATE (A) 10/07/2019 1733   BILIRUBINUR NEGATIVE 10/04/2019 1733   KETONESUR NEGATIVE 09/30/2019 1733   PROTEINUR 30 (A) 10/07/2019 1733   UROBILINOGEN 0.2 10/23/2014 2359   NITRITE NEGATIVE 09/12/2019 1733   LEUKOCYTESUR NEGATIVE 09/15/2019 1733    Recent Results (from the past 240 hour(s))  Blood Culture (routine x 2)     Status: None (Preliminary result)   Collection Time: 10/11/2019  2:05 PM   Specimen: Vein; Blood  Result Value Ref Range Status   Specimen Description   Final    BLOOD LEFT FOREARM BOTTLES DRAWN AEROBIC AND ANAEROBIC   Special Requests   Final    Blood Culture results may not be optimal due to an inadequate volume of blood received in culture bottles Performed at [facility], [address] [place], [place] [address]    Culture PENDING  Incomplete   Report Status PENDING  Incomplete  Blood Culture (routine x  2)     Status: None (Preliminary result)   Collection Time: 09/24/2019  2:36 PM   Specimen: Vein; Blood  Result Value Ref Range Status   Specimen Description BLOOD LEFT WRIST BOTTLES DRAWN AEROBIC ONLY  Final   Special Requests   Final    Blood Culture adequate volume Performed at [facility], [address] [place], [place] [address]    Culture PENDING  Incomplete   Report Status PENDING  Incomplete  MRSA PCR Screening     Status: Abnormal   Collection Time: 09/24/19  1:18 AM   Specimen: Nasal Mucosa; Nasopharyngeal  Result Value Ref Range Status   MRSA by PCR POSITIVE (A) NEGATIVE Final    Comment:        The GeneXpert MRSA Assay (FDA approved for NASAL specimens only), is one component of a comprehensive MRSA colonization surveillance program. It is not intended to diagnose MRSA infection nor to guide or monitor treatment for MRSA  infections. RESULT CALLED TO, READ BACK BY AND VERIFIED WITH: Burnetta Sabin '@[id number]'  09/24/2019 KAY Performed at [facility], [address], [place], [place] [address]      Radiology Studies: [facility] 1 View  Result Date: 09/13/2019 CLINICAL DATA:  COVID positive, altered mental status, shortness of breath EXAM: PORTABLE CHEST 1 VIEW COMPARISON:  03/16/2019 FINDINGS: Cardiomegaly status post median sternotomy and CABG with left chest multi lead pacer. Heterogeneous bilateral airspace opacity, most conspicuous in the right midlung. The visualized skeletal structures are unremarkable. IMPRESSION: Heterogeneous bilateral airspace opacities, most conspicuous in the right midlung, in keeping with reported diagnosis of COVID 19. Electronically Signed   By: Eddie Candle M.D.   On: 10/12/2019 14:59    Scheduled Meds: . Chlorhexidine Gluconate Cloth  6 each Topical Daily  . dexamethasone (DECADRON) injection  6 mg Intravenous Q24H  . enoxaparin (LOVENOX) injection  30 mg Subcutaneous Q24H  . [START ON 09/25/2019] mouth rinse  15 mL Mouth Rinse BID  . mometasone-formoterol  2 puff Inhalation BID  . mupirocin ointment   Nasal BID   Continuous Infusions: . aztreonam 1 g (09/24/19 0639)  . dextrose 5 % with KCl 20 mEq / L 20 mEq (09/24/19 1038)  . diltiazem (CARDIZEM) infusion 13 mg/hr (09/24/19 0822)  . famotidine (PEPCID) IV    . magnesium sulfate bolus IVPB    . metronidazole 500 mg (09/24/19 5038)  . remdesivir 100 mg in NS 100 mL 100 mg (09/24/19 1017)  . vancomycin       LOS: 1 day    Time spent: 35 minutes. Greater than 50% of this time was spent in direct contact with the patient, coordinating care and discussing relevant ongoing clinical issues, including long discussion about healthcare associated/aspiration pneumonia, sepsis and COVID-19 infection.  Patient also demonstrating electrolyte abnormalities, atrial fibrillation with RVR on presentation and acute on chronic renal  failure.  Case discussed with family members; decision has been made to continue treatment was treatable while at the same time keeping patient comfortable and avoid invasive thyroid measurements.  He is DNR/DNI.  Condition and prognosis very guarded.   Barton Dubois, MD [facility] Pager [phone]   09/24/2019, 11:23 AM

## 2019-09-24 NOTE — Progress Notes (Addendum)
Patient had a 11 beat run of V-tach at around 1405. Patient asymptomatic. Dr. Dyann Kief made aware and instructed to keep monitoring.

## 2019-09-25 DIAGNOSIS — J9601 Acute respiratory failure with hypoxia: Secondary | ICD-10-CM

## 2019-09-25 DIAGNOSIS — Z66 Do not resuscitate: Secondary | ICD-10-CM | POA: Diagnosis present

## 2019-09-25 DIAGNOSIS — Z515 Encounter for palliative care: Secondary | ICD-10-CM

## 2019-09-25 DIAGNOSIS — U071 COVID-19: Secondary | ICD-10-CM

## 2019-09-25 DIAGNOSIS — Z7189 Other specified counseling: Secondary | ICD-10-CM

## 2019-09-25 LAB — URINE CULTURE: Culture: NO GROWTH

## 2019-09-25 LAB — COMPREHENSIVE METABOLIC PANEL
ALT: 19 U/L (ref 0–44)
AST: 14 U/L — ABNORMAL LOW (ref 15–41)
Albumin: 2 g/dL — ABNORMAL LOW (ref 3.5–5.0)
Alkaline Phosphatase: 46 U/L (ref 38–126)
BUN: 105 mg/dL — ABNORMAL HIGH (ref 8–23)
CO2: 24 mmol/L (ref 22–32)
Calcium: 8.2 mg/dL — ABNORMAL LOW (ref 8.9–10.3)
Chloride: >130 mmol/L (ref 98–111)
Creatinine, Ser: 2.37 mg/dL — ABNORMAL HIGH (ref 0.61–1.24)
GFR calc Af Amer: 28 mL/min — ABNORMAL LOW (ref >60)
GFR calc non Af Amer: 24 mL/min — ABNORMAL LOW (ref >60)
Glucose, Bld: 315 mg/dL — ABNORMAL HIGH (ref 70–99)
Potassium: 3.5 mmol/L (ref 3.5–5.1)
Sodium: 165 mmol/L (ref 135–145)
Total Bilirubin: 0.8 mg/dL (ref 0.3–1.2)
Total Protein: 5.4 g/dL — ABNORMAL LOW (ref 6.5–8.1)

## 2019-09-25 LAB — CBC WITH DIFFERENTIAL/PLATELET
Abs Immature Granulocytes: 0.05 10*3/uL (ref 0.00–0.07)
Basophils Absolute: 0 10*3/uL (ref 0.0–0.1)
Basophils Relative: 0 %
Eosinophils Absolute: 0 10*3/uL (ref 0.0–0.5)
Eosinophils Relative: 0 %
HCT: 44.1 % (ref 39.0–52.0)
Hemoglobin: 12.2 g/dL — ABNORMAL LOW (ref 13.0–17.0)
Immature Granulocytes: 0 %
Lymphocytes Relative: 3 %
Lymphs Abs: 0.3 10*3/uL — ABNORMAL LOW (ref 0.7–4.0)
MCH: 28.4 pg (ref 26.0–34.0)
MCHC: 27.7 g/dL — ABNORMAL LOW (ref 30.0–36.0)
MCV: 102.6 fL — ABNORMAL HIGH (ref 80.0–100.0)
Monocytes Absolute: 0.1 10*3/uL (ref 0.1–1.0)
Monocytes Relative: 1 %
Neutro Abs: 12.5 10*3/uL — ABNORMAL HIGH (ref 1.7–7.7)
Neutrophils Relative %: 96 %
Platelets: 131 10*3/uL — ABNORMAL LOW (ref 150–400)
RBC: 4.3 MIL/uL (ref 4.22–5.81)
RDW: 16.3 % — ABNORMAL HIGH (ref 11.5–15.5)
WBC: 13.1 10*3/uL — ABNORMAL HIGH (ref 4.0–10.5)
nRBC: 0 % (ref 0.0–0.2)

## 2019-09-25 LAB — MAGNESIUM: Magnesium: 3.1 mg/dL — ABNORMAL HIGH (ref 1.7–2.4)

## 2019-09-25 LAB — C-REACTIVE PROTEIN: CRP: 19.6 mg/dL — ABNORMAL HIGH (ref <1.0)

## 2019-09-25 MED ORDER — HALOPERIDOL 0.5 MG PO TABS: 0.5000 mg | ORAL_TABLET | ORAL | Status: DC | PRN

## 2019-09-25 MED ORDER — LORAZEPAM 1 MG PO TABS: 1.0000 mg | ORAL_TABLET | ORAL | Status: DC | PRN

## 2019-09-25 MED ORDER — LORAZEPAM 2 MG/ML IJ SOLN: 1.0000 mg | INTRAMUSCULAR | Status: DC | PRN

## 2019-09-25 MED ORDER — SODIUM CHLORIDE 0.9 % IV SOLN: 2.0000 g | INTRAVENOUS | Status: DC

## 2019-09-25 MED ORDER — POLYVINYL ALCOHOL 1.4 % OP SOLN: 1.0000 [drp] | Freq: Four times a day (QID) | OPHTHALMIC | Status: DC | PRN

## 2019-09-25 MED ORDER — BIOTENE DRY MOUTH MT LIQD: 15.0000 mL | OROMUCOSAL | Status: DC | PRN

## 2019-09-25 MED ORDER — MORPHINE SULFATE (PF) 2 MG/ML IV SOLN: 2.0000 mg | INTRAVENOUS | Status: DC | PRN

## 2019-09-25 MED ORDER — HALOPERIDOL LACTATE 2 MG/ML PO CONC: 0.5000 mg | ORAL | Status: DC | PRN

## 2019-09-25 MED ORDER — GLYCOPYRROLATE 0.2 MG/ML IJ SOLN
0.2000 mg | INTRAMUSCULAR | Status: DC | PRN
  Administered 2019-09-27: 0.2 mg via INTRAVENOUS
  Filled 2019-09-25: qty 1

## 2019-09-25 MED ORDER — GLYCOPYRROLATE 0.2 MG/ML IJ SOLN: 0.2000 mg | INTRAMUSCULAR | Status: DC | PRN

## 2019-09-25 MED ORDER — LORAZEPAM 2 MG/ML PO CONC: 1.0000 mg | ORAL | Status: DC | PRN

## 2019-09-25 MED ORDER — HALOPERIDOL LACTATE 5 MG/ML IJ SOLN: 0.5000 mg | INTRAMUSCULAR | Status: DC | PRN

## 2019-09-25 MED ORDER — GLYCOPYRROLATE 1 MG PO TABS: 1.0000 mg | ORAL_TABLET | ORAL | Status: DC | PRN

## 2019-09-25 MED FILL — Dextrose Inj 5%: INTRAVENOUS | Qty: 100 | Status: AC

## 2019-09-25 MED FILL — Diltiazem HCl IV Soln 125 MG/25ML (5 MG/ML): INTRAVENOUS | Qty: 125 | Status: AC

## 2019-09-25 NOTE — Progress Notes (Signed)
Patient transported to room 336 and in stable condition. Wife made aware of transfer and verbalized understanding. Patient report given to accepting RN.  Celestia Khat, RN

## 2019-09-25 NOTE — Progress Notes (Signed)
PROGRESS NOTE    CAP MASSI  XOV:[id number] DOB: [birth year] DOA: 09/22/2019 PCP: [name], NP     Brief Narrative:  85 y.o. male with medical history significant for dementia, COPD, atrial fibrillation, bradycardia tachycardia syndrome, coronary artery disease, hypertension, seizures.  Patient was brought to the ED from nursing home with reports of altered mental status, difficulty breathing, and low oxygen levels.  On initial evaluation patient was unresponsive, with time of my evaluation patient opened his eyes but was not verbally answering questions or following directions.  History is obtained from chart review and talking to family spouse 2 daughters and son. At baseline patient is able to awake and alert, able to communicate, answers questions appropriately, he has mild dementia. The first liter reported hypoxia O2 sats in the 50s, with tachycardia and fevers.  ED Course: Temperature 102.8, tachycardic to 179, O2 sats 54% on nonrebreather mask, subsequently switched to BiPAP- [employer]-this was later discontinued.  Markedly elevated sodium at 175, chloride greater than 130, creatinine elevated 2.77, WBC 12.1.  Lactic acid 1.9.  Procalcitonin elevated at 3.24.  Portable chest x-ray heterogeneous bilateral airspace opacities most conspicuous in the right mid lung in keeping with reported diagnosis of COVID-19.  Patient was started on IV vancomycin, metronidazole and aztreonam.  1 L bolus normal saline given.  Cardizem 59m bolus given, and GTT initiated, 10 mg dexamethasone given. EDP talked to patient's wife and son decision was made to make patient comfort care.  Assessment & Plan: 1-acute respiratory failure with hypoxia in the setting of Covid 19 pneumonia and HCAP/aspiration. -Patient with positive MRSA screening -After transition to comfort care and symptomatic management only antibiotics, steroids, remdesivir, Cardizem drip and any other interventions not intended  to control symptoms will be discontinued. -Anticipate hospital death -Appreciate palliative care help and assistance.  2-sepsis in the setting of COVID-19 infection and pneumonia (HCAP/aspiration) -After 48 hours of aggressive treatment using remdesivir, steroids, IV fluids and broad-spectrum antibiotics; patient conditions remain critical without significant improvement. -Discussion with family and involvement of palliative care has reviewed goals of cares and advance directives with decision to transition to comfort and symptomatic management. -Not pursuing any further invasive intervention or antibiotic therapy at this time. -Patient will be transferred to [facility]-Continue supportive care. -Patient met sepsis criteria on presentation with elevated respiratory rate, fever, elevated heart rate and demonstrated source of infection to be his lungs from bacterial bilateral pneumonia. -Patient has organ dysfunction with encephalopathy and acute renal failure.  3-acute metabolic encephalopathy -In the setting of acute infection and abnormal electrolytes -Patient with underlying history of dementia. -Will transition to comfort care and symptomatic management only -Discontinue telemetry.  4-hypernatremia -Sodium 165 today -Most likely in the setting of severe dehydration continue use of diuretics prior to admission. -Goals of care discussion with family and palliative care service as determined transition to symptomatic management and comfort only -No further blood work anticipated.  5-Acute renal failure -At this moment no further blood work anticipated -Prior goals of care discussion care transition to comfort and symptomatic management only.  6-tachybradycardia syndrome -Status post pacemaker -Patient's care has been transitioned to full comfort -Cardizem drip discontinued -If needed will add low-dose beta-blocker to assist with uncomfortable palpitations.  7-increase oral  secretion -Started on Robinul -Patient care transition to come for symptomatic management only. -he is DNR/DNI [facility]  8-pressure injury of the skin -Present prior to admission -Continue constant repositioning and preventive measures. -No open wounds or signs  of superimposed infection presently..  9-dementia with depression -continue holding Wellbutrin, Topamax, trazodone, Risperdal and prozac -Transition to full comfort care -Haldol, Ativan and as needed morphine has been ordered.  10- history of COPD -Currently no wheezing -Acute respiratory failure as mentioned above in the setting of pneumonia and COVID-19 infection -Continue as needed bronchodilators. -full comfort care and symptomatic management decided.  11-essential hypertension -Blood pressure soft but stable -will monitor VS as per protocol -Continue holding any antihypertensive medication for now -transition to full comfort care.  67-EHMCNOB diastolic heart failure -Stable and compensated. -Continue holding diuretics -will transition to full comfort.  13-DNR/DNI -patient with a prior to admission wishes to be DNR/DNI -Prognosis is poor and hospital death anticipated. -Palliative care service has discussed with family members in details and transition to full comfort care decided.   DVT prophylaxis: Lovenox Code Status: DNR Family Communication: wife updated over the phone by palliative service.  Disposition Plan: Remains inpatient, after discussion with family and palliative care intervention; decision has been made to transition to full comfort care.  Will de-escalate on the use of antibiotics or any other nonintentional for symptomatic management treatment.  Anticipate hospital death.  Consultants:   Palliative care  Procedures:   See below for x-ray results  Antimicrobials:  Anti-infectives (From admission, onward)   Start     Dose/Rate Route Frequency Ordered Stop   09/25/19  1400  ceFEPIme (MAXIPIME) 2 g in sodium chloride 0.9 % 100 mL IVPB  Status:  Discontinued     2 g 200 mL/hr over 30 Minutes Intravenous Every 24 hours 09/25/19 1008 09/25/19 1218   09/24/19 1600  vancomycin (VANCOCIN) IVPB 750 mg/150 ml premix  Status:  Discontinued     750 mg 150 mL/hr over 60 Minutes Intravenous Every 24 hours 09/28/2019 1502 09/25/19 1218   09/24/19 1000  remdesivir 100 mg in sodium chloride 0.9 % 100 mL IVPB  Status:  Discontinued     100 mg 200 mL/hr over 30 Minutes Intravenous Daily 10/05/2019 2042 09/25/19 1218    2300  metroNIDAZOLE (FLAGYL) IVPB 500 mg  Status:  Discontinued     500 mg 100 mL/hr over 60 Minutes Intravenous Every 8 hours 09/20/2019 1951 09/25/19 1008   10/04/2019 2200  aztreonam (AZACTAM) 1 g in sodium chloride 0.9 % 100 mL IVPB  Status:  Discontinued     1 g 200 mL/hr over 30 Minutes Intravenous Every 8 hours 10/05/2019 1502 09/25/19 1008   09/18/2019 2000  remdesivir 200 mg in sodium chloride 0.9% 250 mL IVPB     200 mg 580 mL/hr over 30 Minutes Intravenous Once 10/02/2019 1945 09/20/2019 2321   09/18/2019 1515  vancomycin (VANCOCIN) 1,500 mg in sodium chloride 0.9 % 500 mL IVPB     1,500 mg 250 mL/hr over 120 Minutes Intravenous  Once 09/28/2019 1435 09/20/2019 1939   10/01/2019 1415  aztreonam (AZACTAM) 2 g in sodium chloride 0.9 % 100 mL IVPB     2 g 200 mL/hr over 30 Minutes Intravenous  Once 10/12/2019 1406 09/26/2019 1606    1415  metroNIDAZOLE (FLAGYL) IVPB 500 mg     500 mg 100 mL/hr over 60 Minutes Intravenous  Once 09/13/2019 1406 10/04/2019 1606   10/09/2019 1415  vancomycin (VANCOCIN) IVPB 1000 mg/200 mL premix  Status:  Discontinued     1,000 mg 200 mL/hr over 60 Minutes Intravenous  Once 09/16/2019 1406 09/12/2019 1435       Subjective: Currently afebrile, demonstrating significant respiratory distress and  having the need for Cardizem drip to help with rate control. Not eating, no drinking and having trouble with clearing/managing on secretions.    Objective: Vitals:   09/25/19 0807 09/25/19 0830 09/25/19 0900 09/25/19 0930  BP:  (!) 90/48 (!) 91/59 (!) 85/56  Pulse: 74 97 90 (!) 114  Resp: (!) 21 19 (!) 22 17  Temp: 98.7 F (37.1 C)     TempSrc: Axillary     SpO2: 93% 94% 90% 93%  Weight:      Height:        Intake/Output Summary (Last 24 hours) at 09/25/2019 1253 Last data filed at 09/25/2019 0646 Gross per 24 hour  Intake 2398.42 ml  Output 1275 ml  Net 1123.42 ml   Filed Weights   09/24/19 0215 09/25/19 0530 09/25/19 0630  Weight: 66.5 kg 66.8 kg 67.2 kg    Examination: General exam: Alert, oriented x1 and currently afebrile.  Frail, chronically ill in appearance and very deconditioned.  Having difficulty breathing, mild use of accessory muscles demonstrating difficulty clearing secretions in the back of her throat.  Respiratory system: Decreased breath sounds at the bases, positive rhonchi bilaterally; positive tachypnea, no use of accessory muscles appreciated.  Cardiovascular system: Irregular/irregular; no rubs, no gallops, no JVD.   Gastrointestinal system: Abdomen is nondistended, soft and nontender. No organomegaly or masses felt. Normal bowel sounds heard. Central nervous system: Oriented x1; moving 4 limbs spontaneously.  Intermittently able to knot to simple questions. Extremities: No cyanosis or clubbing; no edema. Skin: No petechiae; deep tissue injury affecting sacral and right heel appreciated without signs of superimposed infection or drainage.  These injuries were present prior to admission. Psychiatry: Mood & affect appropriate.    Data Reviewed: I have personally reviewed following labs and imaging studies  CBC: Recent Labs  Lab 09/12/2019 1405 09/24/19 0530 09/25/19 0419  WBC 12.1* 8.1 13.1*  NEUTROABS 10.6* 7.6 12.5*  HGB 15.1 12.4* 12.2*  HCT 54.3* 46.3 44.1  MCV 101.9* 105.0* 102.6*  PLT 164 122* 594*   Basic Metabolic Panel: Recent Labs  Lab 10/12/2019 1405 09/19/2019 2121  09/24/19 0530 09/25/19 0419  NA 175* 172* 170* 165*  K 3.5 3.5 3.2* 3.5  CL >130* >130* >130* >130*  CO2 '28 22 24 24  ' GLUCOSE 112* 174* 349* 315*  BUN 97* 95* 99* 105*  CREATININE 2.77* 2.51* 2.40* 2.37*  CALCIUM 9.2 8.2* 8.0* 8.2*  MG  --   --   --  3.1*   GFR: Estimated Creatinine Clearance: 22.1 mL/min (A) (by C-G formula based on SCr of 2.37 mg/dL (H)).   Liver Function Tests: Recent Labs  Lab 10/09/2019 1405 09/24/19 0530 09/25/19 0419  AST 32 21 14*  ALT '26 22 19  ' ALKPHOS 52 42 46  BILITOT 0.7 0.6 0.8  PROT 7.1 5.7* 5.4*  ALBUMIN 2.9* 2.2* 2.0*   Coagulation Profile: Recent Labs  Lab 10/02/2019 1405  INR 1.2   Lipid Profile: Recent Labs    09/30/2019 1436  TRIG 140   Anemia Panel: Recent Labs    10/11/2019 1436 09/24/19 0530  FERRITIN 467* 459*   Urine analysis:    Component Value Date/Time   COLORURINE YELLOW 10/07/2019 1733   APPEARANCEUR CLOUDY (A) 10/11/2019 1733   LABSPEC 1.017 09/21/2019 1733   PHURINE 5.0 09/17/2019 1733   GLUCOSEU NEGATIVE 10/01/2019 1733   HGBUR MODERATE (A) 10/05/2019 1733   BILIRUBINUR NEGATIVE 10/07/2019 1733   KETONESUR NEGATIVE 10/01/2019 1733   PROTEINUR 30 (A) 10/08/2019  1733   UROBILINOGEN 0.2 10/23/2014 2359   NITRITE NEGATIVE 09/28/2019 1733   LEUKOCYTESUR NEGATIVE 09/30/2019 1733    Recent Results (from the past 240 hour(s))  Blood Culture (routine x 2)     Status: None (Preliminary result)   Collection Time: 09/22/2019  2:05 PM   Specimen: BLOOD LEFT FOREARM  Result Value Ref Range Status   Specimen Description   Final    BLOOD LEFT FOREARM BOTTLES DRAWN AEROBIC AND ANAEROBIC   Special Requests   Final    Blood Culture results may not be optimal due to an inadequate volume of blood received in culture bottles   Culture   Final    NO GROWTH 2 DAYS Performed at [facility], [address], [place], [place] [address]    Report Status PENDING  Incomplete  Blood Culture (routine x 2)     Status: None  (Preliminary result)   Collection Time: 10/08/2019  2:36 PM   Specimen: BLOOD LEFT WRIST  Result Value Ref Range Status   Specimen Description BLOOD LEFT WRIST BOTTLES DRAWN AEROBIC ONLY  Final   Special Requests Blood Culture adequate volume  Final   Culture   Final    NO GROWTH 2 DAYS Performed at [facility], [address] [place], [place] [address]    Report Status PENDING  Incomplete  Urine culture     Status: None   Collection Time: 09/22/2019  5:33 PM   Specimen: In/Out Cath Urine  Result Value Ref Range Status   Specimen Description   Final    IN/OUT CATH URINE Performed at [facility], [address], [place], [place] [address]    Special Requests   Final    NONE Performed at [facility], [address] [place], [place] [address]    Culture   Final    NO GROWTH Performed at [facility], [place] [address] [place], [place] [address]    Report Status 09/25/2019 FINAL  Final  MRSA PCR Screening     Status: Abnormal   Collection Time: 09/24/19  1:18 AM   Specimen: Nasal Mucosa; Nasopharyngeal  Result Value Ref Range Status   MRSA by PCR POSITIVE (A) NEGATIVE Final    Comment:        The GeneXpert MRSA Assay (FDA approved for NASAL specimens only), is one component of a comprehensive MRSA colonization surveillance program. It is not intended to diagnose MRSA infection nor to guide or monitor treatment for MRSA infections. RESULT CALLED TO, READ BACK BY AND VERIFIED WITH: Burnetta Sabin '@[id number]'  09/24/2019 KAY Performed at [facility], [address], [place], [place] [address]      Radiology Studies: [facility] 1 View  Result Date: 10/04/2019 CLINICAL DATA:  COVID positive, altered mental status, shortness of breath EXAM: PORTABLE CHEST 1 VIEW COMPARISON:  03/16/2019 FINDINGS: Cardiomegaly status post median sternotomy and CABG with left chest multi lead pacer. Heterogeneous bilateral airspace opacity, most conspicuous in the right midlung. The  visualized skeletal structures are unremarkable. IMPRESSION: Heterogeneous bilateral airspace opacities, most conspicuous in the right midlung, in keeping with reported diagnosis of COVID 19. Electronically Signed   By: Eddie Candle M.D.   On: 09/22/2019 14:59    Scheduled Meds: . Chlorhexidine Gluconate Cloth  6 each Topical Daily  . mouth rinse  15 mL Mouth Rinse BID  . mouth rinse  15 mL Mouth Rinse BID  . mupirocin ointment   Nasal BID  . scopolamine  1 patch  Transdermal Q72H   Continuous Infusions:    LOS: 2 days    Time spent: 35 minutes.   Barton Dubois, MD [facility] Pager [phone]   09/25/2019, 12:53 PM

## 2019-09-25 NOTE — Consult Note (Signed)
Consultation Note Date: 09/25/2019   Patient Name: Zachary Warren  DOB: 11-29-1932  MRN: [id number]  Age / Sex: 85 y.o., male  PCP: [name], NP Referring Physician: Barton Dubois, MD  Reason for Consultation: Establishing goals of care  HPI/Patient Profile: 85 y.o. male  with past medical history of dementia, prostate cancer, CABG x 5, admitted on 09/21/2019 with respiratory distress. Workup revealed COVID pneumonia, possible aspiration bacteria pnuemonia, sepsis, hypovolemic hypernatremia, acute kidney injury, tachybradycardia syndrome. Has had aggressive medical treatment with IV fluids, labs, cardizem infusion, COVID medications- however, he remains unresponsive, not eating or drinking, HR remains elevated and difficult to control due to hypotension. Palliative medicine consulted for [place].   Clinical Assessment and Goals of Care: Evaluated patient and received report from his RN. Chart was thoroughly reviewed including labs, notes, imaging.  I called and spoke to [place].  Prior to admission he was residing at [facility]. He could carry simple conversations, walked intermittently with a walker. Incontinent. [name] has been his spouse and caregiver for 29 years. He is retired from working as an Geophysical data processor for [employer]. Julia enjoyed golf trips, eating in restaurants, and Radio producer. He has three children. Patient's current illness and comorbidities were discussed. We discussed his current state and barriers to recovery.  Additionally, discussed how COVID and acute illnesses worsen patients's functional status and trajectory is likely very poor.  Continued aggressive care vs transition to full comfort measures was discussed. Comfort measures defined as: no further curative intent to interventions- stopping IV fluids, medications, no artificial feeding. Providing care  to manage symptoms and ensure patient is not suffering during dying process.  [name] stated that if Joshwa were in his best days and could see his current life and what his future likely held with continued aggressive care he would say, "This isn't living" and would elect for comfort measures only.  [name] agreed that transition in care to comfort care aligned with patient's and her goals of care.   Primary Decision Maker NEXT OF KIN- spouse- [name]    SUMMARY OF RECOMMENDATIONS -Transition to full comfort care -D/C interventions not intended for comfort -Morphine, lorazepam, robinul, and haldol ordered prn for comfort    Code Status/Advance Care Planning:  DNR  Palliative Prophylaxis:   Aspiration and Delirium Protocol  Additional Recommendations (Limitations, Scope, Preferences):  Full Comfort Care  Prognosis:    Hours - Days  Discharge Planning: Anticipated Hospital Death  Primary Diagnoses: Present on Admission: . Pneumonia due to COVID-19 virus . Persistent atrial fibrillation ([place]) . Hypertension . Dementia ([place]) . CAD, NATIVE VESSEL . BRADYCARDIA-TACHYCARDIA SYNDROME . COPD (chronic obstructive pulmonary disease) ([place])   I have reviewed the medical record, interviewed the patient and family, and examined the patient. The following aspects are pertinent.  Past Medical History:  Diagnosis Date  . Atypical nevus 04/26/1990   left forearm (moderate) tx w/s  . Atypical nevus 02/24/2000   1. left forearm, ulnar (moderate), 2. left forearm radial (moderate) tx w/s  .  Atypical nevus 04/16/2000   left forearm radial tx Dr [name] Rile  . Atypical nevus 09/08/2014   right back moderate   . BCC (basal cell carcinoma of skin) 05/25/2005   bcc left upper forehead tx [facility] Dr Vida Rigger  . BCC (basal cell carcinoma of skin) 12/09/2010   bcc ear tx cx3 59fu  . BCC (basal cell carcinoma of skin) 07/20/2012   bcc left neck tx curet cautery   . BCC (basal cell carcinoma of skin)  03/13/2014   bcc left eyelid tx MOHS  . BCC (basal cell carcinoma of skin) 05/11/2017   bcc ulc. nod. left temple tx letter mailed to patient surg needed  . COPD (chronic obstructive pulmonary disease) ([place])   . Coronary artery disease    s/p 5v CABG 2002  . CVA (cerebral infarction) 1999  . DDD (degenerative disc disease), cervical   . Dementia ([place])   . Depression   . GERD (gastroesophageal reflux disease)   . History of kidney stones   . Hyperlipidemia   . Hypertension   . Memory impairment   . OSA (obstructive sleep apnea)    Recently diagnosed though pt. has not had CPAP trial  . Permanent atrial fibrillation (HCC)    chads2vasc score is 6  . Pneumonia    recent hospital admission for pneumonia   . Prostate cancer ([place]) 2006   s/p XRT  . PVD (peripheral vascular disease) ([place])   . SCCA (squamous cell carcinoma) of skin 05/25/2005   scc in situ left hand tx cx3 41fu  . SCCA (squamous cell carcinoma) of skin 05/25/2005   scc in situ left ear rim post tx cx3 64fu  . SCCA (squamous cell carcinoma) of skin 02/12/2016   scc in situ right hand tx curet cautery  . Seizures ([place])   . Tachycardia-bradycardia [facility])    s/p PPM by [facility] 6/12   Social History   Socioeconomic History  . Marital status: Married    Spouse name: [name]  . Number of children: 3  . Years of education: Not on file  . Highest education level: Not on file  Occupational History  . Occupation: RETIRED    Employer: RETIRED    Comment: [employer]  Tobacco Use  . Smoking status: Former Smoker    Packs/day: 2.00    Years: 48.00    Pack years: 96.00    Types: Cigarettes    Quit date: 03/09/1998    Years since quitting: 21.5  . Smokeless tobacco: Never Used  Substance and Sexual Activity  . Alcohol use: No    Comment: quit: 1999  . Drug use: No  . Sexual activity: Never  Other Topics Concern  . Not on file  Social History Narrative   Pt lives in [place] with spouse.  Retired.  Prior Biochemist, clinical for  [employer].   Caffeine Use: once daily   Married 20 years   Social Determinants of Health   Financial Resource Strain:   . Difficulty of Paying Living Expenses: Not on file  Food Insecurity:   . Worried About Charity fundraiser in the Last Year: Not on file  . Ran Out of Food in the Last Year: Not on file  Transportation Needs:   . Lack of Transportation (Medical): Not on file  . Lack of Transportation (Non-Medical): Not on file  Physical Activity:   . Days of Exercise per Week: Not on file  . Minutes of Exercise per Session: Not on file  Stress:   . Feeling of Stress : Not on file  Social Connections:   . Frequency of Communication with Friends and Family: Not on file  . Frequency of Social Gatherings with Friends and Family: Not on file  . Attends Religious Services: Not on file  . Active Member of Clubs or Organizations: Not on file  . Attends Archivist Meetings: Not on file  . Marital Status: Not on file   Family History  Problem Relation Age of Onset  . Cancer Mother        ovarian  . Ovarian cancer Mother   . Cancer Father        pancreatic  . Pancreatic cancer Father   . Cancer Brother        lung  . Ovarian cancer Other   . Pancreatic cancer Other   . Lung cancer Other    Scheduled Meds: . Chlorhexidine Gluconate Cloth  6 each Topical Daily  . mouth rinse  15 mL Mouth Rinse BID  . mouth rinse  15 mL Mouth Rinse BID  . mupirocin ointment   Nasal BID  . scopolamine  1 patch Transdermal Q72H   Continuous Infusions: PRN Meds:.acetaminophen, albuterol, antiseptic oral rinse, glycopyrrolate **OR** glycopyrrolate **OR** glycopyrrolate, haloperidol **OR** haloperidol **OR** haloperidol lactate, LORazepam **OR** LORazepam **OR** LORazepam, morphine injection, ondansetron **OR** ondansetron (ZOFRAN) IV, polyvinyl alcohol Medications Prior to Admission:  Prior to Admission medications   Medication Sig Start Date End Date Taking? Authorizing  Provider  acetaminophen (TYLENOL) 325 MG tablet Take 500 mg by mouth every 6 (six) hours as needed.   Yes [provider]  apixaban (ELIQUIS) 5 MG TABS tablet Take 1 tablet (5 mg total) by mouth 2 (two) times daily. 10/27/17  Yes Allred, Jeneen Rinks, MD  budesonide-formoterol (SYMBICORT) 80-4.5 MCG/ACT inhaler Inhale 2 puffs into the lungs daily.   Yes [provider]  buPROPion (WELLBUTRIN) 75 MG tablet Take 75 mg by mouth 2 (two) times daily. 08/31/17  Yes [provider]  dexamethasone (DECADRON) 4 MG tablet Take 6 mg by mouth See admin instructions. Take 1 and 1/2 tablets by mouth once daily for 7 days starting on 09/20/19   Yes [provider]  diltiazem (TIAZAC) 240 MG 24 hr capsule Take 240 mg by mouth daily. 08/29/19  Yes [provider]  ferrous sulfate 325 (65 FE) MG EC tablet Take 325 mg by mouth daily with breakfast.   Yes [provider]  FLUoxetine (PROZAC) 40 MG capsule Take 40 mg by mouth daily.     Yes [provider]  furosemide (LASIX) 20 MG tablet Take 2 tablets (40 mg total) by mouth daily. 05/31/18  Yes Kathie Dike, MD  ipratropium-albuterol (DUONEB) 0.5-2.5 (3) MG/3ML SOLN Take 3 mLs by nebulization 3 (three) times daily. 05/31/18  Yes Kathie Dike, MD  pantoprazole (PROTONIX) 40 MG tablet Take 40 mg by mouth daily. 10/26/17  Yes [provider]  potassium chloride SA (K-DUR,KLOR-CON) 20 MEQ tablet Take 20 mEq by mouth 2 (two) times daily.  08/31/17  Yes [provider]  risperiDONE (RISPERDAL) 0.25 MG tablet Take 0.25 mg by mouth 2 (two) times daily as needed. 08/31/17  Yes [provider]  topiramate (TOPAMAX) 50 MG tablet Take 1 tablet (50 mg total) by mouth 2 (two) times daily. 07/25/15  Yes Penumalli, Earlean Polka, MD  atorvastatin (LIPITOR) 40 MG tablet TAKE 1 TABLET BY MOUTH EVERY DAY - PATIENT DUE FOR LIPID PANEL Patient taking differently: Take 40  mg by mouth daily.  06/01/17   Jerline Pain, MD  calcium carbonate (TUMS - DOSED IN MG ELEMENTAL CALCIUM) 500 MG chewable tablet Chew 1 tablet by mouth 2 (two) times daily.    [provider]  cholecalciferol (VITAMIN D) 1000 units tablet Take 1,000 Units by mouth daily.    [provider]  diltiazem (CARTIA XT) 180 MG 24 hr capsule Take 1 capsule (180 mg total) by mouth 2 (two) times daily. Please keep upcoming appt for future refills. Thank you 10/27/17   Thompson Grayer, MD  Multiple Vitamin (THEREMS PO) Take 1 tablet by mouth daily.    [provider]  traZODone (DESYREL) 50 MG tablet Take 50 mg by mouth at bedtime as needed. 08/31/17   [provider]   Allergies  Allergen Reactions  . Aricept [Donepezil Hcl] Other (See Comments)    "stomach pain", generic  . Ceclor [Cefaclor]     Possible serum sickness  . Namenda [Memantine Hcl] Other (See Comments)    "dizzy", brand medication  . Ramipril Other (See Comments)     cough   Review of Systems  Physical Exam- deferred due to patient COVID positive in effort to preserve PPE during pandemic- received detailed report from RN and viewed patient through glass door  Vital Signs: BP (!) 85/56   Pulse (!) 114   Temp 98.7 F (37.1 C) (Axillary)   Resp 17   Ht 6' (1.829 m)   Wt 67.2 kg   SpO2 93%   BMI 20.09 kg/m  Pain Scale: CPOT POSS *See Group Information*: 1-Acceptable,Awake and alert Pain Score: 0-No pain   SpO2: SpO2: 93 % O2 Device:SpO2: 93 % O2 Flow Rate: .O2 Flow Rate (L/min): 4 L/min  IO: Intake/output summary:   Intake/Output Summary (Last 24 hours) at 09/25/2019 1222 Last data filed at 09/25/2019 0646 Gross per 24 hour  Intake 2398.42 ml  Output 1275 ml  Net 1123.42 ml    LBM:   Baseline Weight: Weight: 77 kg Most recent weight: Weight: 67.2 kg     Palliative Assessment/Data: PPS: 10%   The above conversation was completed via telephone due to the visitor restrictions during the COVID-19 pandemic. Thorough  chart review and discussion with necessary members of the care team was completed as part of assessment. All issues were discussed and addressed but no physical exam was performed.  Thank you for this consult. Palliative medicine will continue to follow and assist as needed.   Time In: 1100 Time Out: 1230 Time Total: 90 mins Prolonged time billed: yes Greater than 50%  of this time was spent counseling and coordinating care related to the above assessment and plan.  Signed by: Mariana Kaufman, AGNP-C Palliative Medicine    Please contact Palliative Medicine Team phone at [phone] for questions and concerns.  For individual provider: See Shea Evans

## 2019-09-26 DIAGNOSIS — Z515 Encounter for palliative care: Secondary | ICD-10-CM

## 2019-09-26 MED ORDER — MORPHINE 100MG IN NS 100ML (1MG/ML) PREMIX INFUSION
1.0000 mg/h | INTRAVENOUS | Status: DC
  Administered 2019-09-26 – 2019-09-28 (×2): 1 mg/h via INTRAVENOUS
  Filled 2019-09-26: qty 100

## 2019-09-26 MED ORDER — MORPHINE SULFATE (PF) 2 MG/ML IV SOLN
2.0000 mg | INTRAVENOUS | Status: AC
  Administered 2019-09-26: 2 mg via INTRAVENOUS
  Filled 2019-09-26: qty 1

## 2019-09-26 MED ORDER — MORPHINE BOLUS VIA INFUSION
2.0000 mg | INTRAVENOUS | Status: DC | PRN
  Filled 2019-09-26: qty 2

## 2019-09-26 NOTE — Progress Notes (Signed)
   09/26/19 1419  Family/Significant Other Communication  Family/Significant Other Update Called;Updated (Wife [name])

## 2019-09-26 NOTE — Progress Notes (Signed)
Daily Progress Note   Patient Name: Zachary Warren       Date: 09/26/2019 DOB: [birth year]  Age: 85 y.o. MRN#: [id number] Attending Physician: Barton Dubois, MD Primary Care Physician: [name], NP Admit Date: 10/12/2019  Reason for Consultation/Follow-up: Establishing goals of care  Subjective: Patient in bed. Opens eyes, but does not speak. Breathing appears labored. Called [name] and updated.   Review of Systems  Unable to perform ROS: Dementia    Length of Stay: 3  Current Medications: Scheduled Meds:  . Chlorhexidine Gluconate Cloth  6 each Topical Daily  . mouth rinse  15 mL Mouth Rinse BID  . mouth rinse  15 mL Mouth Rinse BID  .  morphine injection  2 mg Intravenous NOW  . mupirocin ointment   Nasal BID  . scopolamine  1 patch Transdermal Q72H    Continuous Infusions: . morphine      PRN Meds: acetaminophen, albuterol, antiseptic oral rinse, glycopyrrolate **OR** glycopyrrolate **OR** glycopyrrolate, haloperidol **OR** haloperidol **OR** haloperidol lactate, LORazepam **OR** LORazepam **OR** LORazepam, morphine, ondansetron **OR** ondansetron (ZOFRAN) IV, polyvinyl alcohol  Physical Exam Vitals and nursing note reviewed.  Constitutional:      Appearance: He is ill-appearing.     Comments: frail  Skin:    Coloration: Skin is pale.             Vital Signs: BP 108/87 (BP Location: Left Arm)   Pulse (!) 125   Temp 99 F (37.2 C) (Axillary)   Resp (!) 24   Ht 6' (1.829 m)   Wt 67.2 kg   SpO2 (!) 83%   BMI 20.09 kg/m  SpO2: SpO2: (!) 83 % O2 Device: O2 Device: Nasal Cannula O2 Flow Rate: O2 Flow Rate (L/min): 2 L/min  Intake/output summary:   Intake/Output Summary (Last 24 hours) at 09/26/2019 1111 Last data filed at 09/26/2019 0500 Gross per  24 hour  Intake 1323.11 ml  Output 900 ml  Net 423.11 ml   LBM:   Baseline Weight: Weight: 77 kg Most recent weight: Weight: 67.2 kg       Palliative Assessment/Data: PPS: 10%     Patient Active Problem List   Diagnosis Date Noted  . Palliative care by specialist   . DNR (do not resuscitate)   . Comfort measures only status   .  Advanced care planning/counseling discussion   . Goals of care, counseling/discussion   . Acute respiratory failure with hypoxia ([place])   . COVID-19 virus infection   . Pressure injury of skin 09/24/2019  . Pneumonia due to COVID-19 virus 09/22/2019  . Hypernatremia 10/05/2019  . AKI (acute kidney injury) ([place]) 09/14/2019  . Tachycardia-bradycardia ([place])   . Seizures ([place])   . PVD (peripheral vascular disease) ([place])   . Pneumonia   . Persistent atrial fibrillation ([place])   . Pacemaker   . OSA (obstructive sleep apnea)   . Memory impairment   . Hypertension   . History of kidney stones   . Depression   . Dementia ([place])   . DDD (degenerative disc disease), cervical   . Coronary artery disease   . COPD (chronic obstructive pulmonary disease) ([place])   . LLL pneumonia 07/11/2017  . HCAP (healthcare-associated pneumonia) 07/11/2017  . Fever 07/10/2017  . Hypokalemia 10/25/2014  . MCI (mild cognitive impairment) with memory loss 07/24/2014  . Cardiac pacemaker in situ 12/25/2013  . Aneurysm ([place]) 06/28/2012  . CVA (cerebral infarction) 02/05/2012  . Dysphagia as late effect of stroke 02/05/2012  . GERD (gastroesophageal reflux disease) 02/05/2012  . 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 Hyperlipidemia 11/21/2010  . Essential hypertension 11/21/2010  . CAD, NATIVE VESSEL 11/21/2010  . Prostate cancer [facility]) 10/12/2004    Palliative Care Assessment & Plan   Patient Profile: 85 y.o. male  with past medical history of dementia, prostate cancer, CABG x 5, admitted on 09/17/2019 with respiratory distress. Workup revealed COVID pneumonia,  possible aspiration bacteria pnuemonia, sepsis, hypovolemic hypernatremia, acute kidney injury, tachybradycardia syndrome. Has had aggressive medical treatment with IV fluids, labs, cardizem infusion, COVID medications- however, he remains unresponsive, not eating or drinking, HR remains elevated and difficult to control due to hypotension. Palliative medicine consulted for [place].    Assessment/Recommendations/Plan   Start morphine infusion 1mg /hr with bolus 2mg  q53min prn for increased WOB  Goals of Care and Additional Recommendations:  Limitations on Scope of Treatment: Full Comfort Care  Code Status:  DNR  Prognosis:   Hours - Days  Discharge Planning:  Anticipated Hospital Death  Care plan was discussed with Dr. Dyann Kief and patient's spouse- [name].  Thank you for allowing the Palliative Medicine Team to assist in the care of this patient.   Time In: 1040 Time Out: 1115 Total Time 35 mins Prolonged Time Billed No      Greater than 50%  of this time was spent counseling and coordinating care related to the above assessment and plan.  Mariana Kaufman, AGNP-C Palliative Medicine   Please contact Palliative Medicine Team phone at [phone] for questions and concerns.

## 2019-09-26 NOTE — Progress Notes (Signed)
PROGRESS NOTE    Zachary Warren  ZHY:[id number] DOB: September 02, 1933 DOA: 09/28/2019 PCP: [name], NP     Brief Narrative:  85 y.o. male with medical history significant for dementia, COPD, atrial fibrillation, bradycardia tachycardia syndrome, coronary artery disease, hypertension, seizures.  Patient was brought to the ED from nursing home with reports of altered mental status, difficulty breathing, and low oxygen levels.  On initial evaluation patient was unresponsive, with time of my evaluation patient opened his eyes but was not verbally answering questions or following directions.  History is obtained from chart review and talking to family spouse 2 daughters and son. At baseline patient is able to awake and alert, able to communicate, answers questions appropriately, he has mild dementia. The first liter reported hypoxia O2 sats in the 50s, with tachycardia and fevers.  ED Course: Temperature 102.8, tachycardic to 179, O2 sats 54% on nonrebreather mask, subsequently switched to BiPAP- [employer]-this was later discontinued.  Markedly elevated sodium at 175, chloride greater than 130, creatinine elevated 2.77, WBC 12.1.  Lactic acid 1.9.  Procalcitonin elevated at 3.24.  Portable chest x-ray heterogeneous bilateral airspace opacities most conspicuous in the right mid lung in keeping with reported diagnosis of COVID-19.  Patient was started on IV vancomycin, metronidazole and aztreonam.  1 L bolus normal saline given.  Cardizem 93m bolus given, and GTT initiated, 10 mg dexamethasone given. EDP talked to patient's wife and son decision was made to make patient comfort care.  Assessment & Plan: 1-acute respiratory failure with hypoxia in the setting of Covid 19 pneumonia and HCAP/aspiration. -Patient with positive MRSA screening -After transition to comfort care and symptomatic management only antibiotics, steroids, remdesivir, Cardizem drip and any other interventions not intended  to control symptoms will be discontinued. -Anticipate hospital death -Appreciate palliative care help and assistance.  2-sepsis in the setting of COVID-19 infection and pneumonia (HCAP/aspiration) -After 48 hours of aggressive treatment using remdesivir, steroids, IV fluids and broad-spectrum antibiotics; patient conditions remain critical without significant improvement. -Discussion with family and involvement of palliative care has reviewed goals of cares and advance directives with decision to transition to comfort and symptomatic management. -Not pursuing any further invasive intervention or antibiotic therapy at this time. -[facility] [place] [facility] [place] [facility]Continue supportive care. -Patient met sepsis criteria on presentation with elevated respiratory rate, fever, elevated heart rate and demonstrated source of infection to be his lungs from bacterial bilateral pneumonia. -Patient has organ dysfunction with encephalopathy and acute renal failure.  3-acute metabolic encephalopathy -In the setting of acute infection and abnormal electrolytes -Patient with underlying history of dementia. -Will transition to comfort care and symptomatic management only -Discontinue telemetry.  4-hypernatremia -Sodium last checked was 165. -Most likely in the setting of severe dehydration continue use of diuretics prior to admission. -Goals of care discussion with family and palliative care service as determined transition to symptomatic management and comfort only -No further blood work anticipated.  5-Acute renal failure -At this moment no further blood work anticipated -Prior goals of care discussion care transition to comfort and symptomatic management only.  6-tachybradycardia syndrome -Status post pacemaker -Patient's care has been transitioned to full comfort -Cardizem drip discontinued -If needed will add low-dose beta-blocker to assist with uncomfortable palpitations.  7-increase  oral secretion -Started on Robinul -Patient care transition to come for symptomatic management only. -he is D[facility]DNI [facility]  8-pressure injury of the skin -Present prior to admission -Continue constant repositioning and preventive measures. -No open wounds  or signs of superimposed infection presently..  9-dementia with depression -continue holding Wellbutrin, Topamax, trazodone, Risperdal and prozac -Transition to full comfort care -Haldol, Ativan and as needed morphine has been ordered.  10- history of COPD -Currently no wheezing -Acute respiratory failure as mentioned above in the setting of pneumonia and COVID-19 infection -Continue as needed bronchodilators. -full comfort care and symptomatic management decided.  11-essential hypertension -Blood pressure soft but stable -will monitor VS as per protocol -Continue holding any antihypertensive medication for now -transition to full comfort care.  81-XBJYNWG diastolic heart failure -Stable and compensated. -Continue holding diuretics -will transition to full comfort.  13-DNR/DNI -patient with a prior to admission wishes to be DNR/DNI -Prognosis is poor and hospital death anticipated. -Palliative care service has discussed with family members in details and transition to full comfort care decided.   DVT prophylaxis: Lovenox Code Status: DNR Family Communication: none at bedside, wife updated over the phone by palliative service.  Disposition Plan: Remains inpatient, continue symptomatic management and comfort care.  Anticipate hospital death.  Consultants:   Palliative care  Procedures:   See below for x-ray results  Antimicrobials:  Anti-infectives (From admission, onward)   Start     Dose/Rate Route Frequency Ordered Stop   09/25/19 1400  ceFEPIme (MAXIPIME) 2 g in sodium chloride 0.9 % 100 mL IVPB  Status:  Discontinued     2 g 200 mL/hr over 30 Minutes Intravenous Every 24 hours 09/25/19  1008 09/25/19 1218   09/24/19 1600  vancomycin (VANCOCIN) IVPB 750 mg/150 ml premix  Status:  Discontinued     750 mg 150 mL/hr over 60 Minutes Intravenous Every 24 hours 10/05/2019 1502 09/25/19 1218   09/24/19 1000  remdesivir 100 mg in sodium chloride 0.9 % 100 mL IVPB  Status:  Discontinued     100 mg 200 mL/hr over 30 Minutes Intravenous Daily 09/30/2019 2042 09/25/19 1218   10/05/2019 2300  metroNIDAZOLE (FLAGYL) IVPB 500 mg  Status:  Discontinued     500 mg 100 mL/hr over 60 Minutes Intravenous Every 8 hours 09/22/2019 1951 09/25/19 1008   09/13/2019 2200  aztreonam (AZACTAM) 1 g in sodium chloride 0.9 % 100 mL IVPB  Status:  Discontinued     1 g 200 mL/hr over 30 Minutes Intravenous Every 8 hours 09/16/2019 1502 09/25/19 1008   09/14/2019 2000  remdesivir 200 mg in sodium chloride 0.9% 250 mL IVPB     200 mg 580 mL/hr over 30 Minutes Intravenous Once 09/30/2019 1945 09/20/2019 2321    1515  vancomycin (VANCOCIN) 1,500 mg in sodium chloride 0.9 % 500 mL IVPB     1,500 mg 250 mL/hr over 120 Minutes Intravenous  Once 09/28/2019 1435 09/21/2019 1939   10/06/2019 1415  aztreonam (AZACTAM) 2 g in sodium chloride 0.9 % 100 mL IVPB     2 g 200 mL/hr over 30 Minutes Intravenous  Once 09/25/2019 1406 09/25/2019 1606   09/21/2019 1415  metroNIDAZOLE (FLAGYL) IVPB 500 mg     500 mg 100 mL/hr over 60 Minutes Intravenous  Once 09/22/2019 1406 10/01/2019 1606   10/11/2019 1415  vancomycin (VANCOCIN) IVPB 1000 mg/200 mL premix  Status:  Discontinued     1,000 mg 200 mL/hr over 60 Minutes Intravenous  Once 09/30/2019 1406 10/12/2019 1435       Subjective: Low-grade temperature appreciated overnight; labored breathing appreciated.   Objective: Vitals:   09/26/19 0547 09/26/19 0630 09/26/19 0631 09/26/19 1117  BP: 108/87   (!) 130/96  Pulse: Marland Kitchen)  134 (!) 125  67  Resp: (!) 24  (!) 24   Temp: 99 F (37.2 C)   99.1 F (37.3 C)  TempSrc: Axillary   Oral  SpO2: (!) 79% (!) 83%  (!) 86%  Weight:      Height:         Intake/Output Summary (Last 24 hours) at 09/26/2019 1252 Last data filed at 09/26/2019 0500 Gross per 24 hour  Intake 1323.11 ml  Output 900 ml  Net 423.11 ml   Filed Weights   09/24/19 0215 09/25/19 0530 09/25/19 0630  Weight: 66.5 kg 66.8 kg 67.2 kg    Examination: General exam: Low-grade temperature overnight appreciated; patient opens eyes but not able to speak.  Breathing appears to be labor. Respiratory system: Mild use of accessory muscles, diffuse rhonchi, positive tachypnea. Cardiovascular system: tachycardic, No rubs, no gallops. Gastrointestinal system: Abdomen is nondistended, soft and nontender. No organomegaly or masses felt. Normal bowel sounds heard. Central nervous system: opening eyes, but not interactive.  Extremities: No cyanosis, no clubbing. Skin: No petechiae; no signs of superimposed infection.  Patient with deep tissue injury affecting sacral and right heel area.  Injuries were present prior to admission. Psychiatry: Mood & affect appropriate.    Data Reviewed: I have personally reviewed following labs and imaging studies  CBC: Recent Labs  Lab 10/12/2019 1405 09/24/19 0530 09/25/19 0419  WBC 12.1* 8.1 13.1*  NEUTROABS 10.6* 7.6 12.5*  HGB 15.1 12.4* 12.2*  HCT 54.3* 46.3 44.1  MCV 101.9* 105.0* 102.6*  PLT 164 122* 789*   Basic Metabolic Panel: Recent Labs  Lab 09/19/2019 1405 09/21/2019 2121 09/24/19 0530 09/25/19 0419  NA 175* 172* 170* 165*  K 3.5 3.5 3.2* 3.5  CL >130* >130* >130* >130*  CO2 '28 22 24 24  ' GLUCOSE 112* 174* 349* 315*  BUN 97* 95* 99* 105*  CREATININE 2.77* 2.51* 2.40* 2.37*  CALCIUM 9.2 8.2* 8.0* 8.2*  MG  --   --   --  3.1*   GFR: Estimated Creatinine Clearance: 22.1 mL/min (A) (by C-G formula based on SCr of 2.37 mg/dL (H)).   Liver Function Tests: Recent Labs  Lab 10/03/2019 1405 09/24/19 0530 09/25/19 0419  AST 32 21 14*  ALT '26 22 19  ' ALKPHOS 52 42 46  BILITOT 0.7 0.6 0.8  PROT 7.1 5.7* 5.4*  ALBUMIN  2.9* 2.2* 2.0*   Coagulation Profile: Recent Labs  Lab 09/20/2019 1405  INR 1.2   Lipid Profile: Recent Labs    10/06/2019 1436  TRIG 140   Anemia Panel: Recent Labs    09/12/2019 1436 09/24/19 0530  FERRITIN 467* 459*   Urine analysis:    Component Value Date/Time   COLORURINE YELLOW 10/12/2019 1733   APPEARANCEUR CLOUDY (A) 10/05/2019 1733   LABSPEC 1.017 09/20/2019 1733   PHURINE 5.0 09/13/2019 1733   GLUCOSEU NEGATIVE 10/09/2019 1733   HGBUR MODERATE (A) 09/22/2019 1733   BILIRUBINUR NEGATIVE 09/22/2019 1733   KETONESUR NEGATIVE 09/22/2019 1733   PROTEINUR 30 (A) 10/11/2019 1733   UROBILINOGEN 0.2 10/23/2014 2359   NITRITE NEGATIVE 10/08/2019 1733   LEUKOCYTESUR NEGATIVE 10/06/2019 1733    Recent Results (from the past 240 hour(s))  Blood Culture (routine x 2)     Status: None (Preliminary result)   Collection Time: 09/28/2019  2:05 PM   Specimen: BLOOD LEFT FOREARM  Result Value Ref Range Status   Specimen Description   Final    BLOOD LEFT FOREARM BOTTLES DRAWN AEROBIC AND ANAEROBIC  Special Requests   Final    Blood Culture results may not be optimal due to an inadequate volume of blood received in culture bottles   Culture   Final    NO GROWTH 3 DAYS Performed at [facility], [address] [place], [place] [address]    Report Status PENDING  Incomplete  Blood Culture (routine x 2)     Status: None (Preliminary result)   Collection Time: 09/18/2019  2:36 PM   Specimen: BLOOD LEFT WRIST  Result Value Ref Range Status   Specimen Description BLOOD LEFT WRIST BOTTLES DRAWN AEROBIC ONLY  Final   Special Requests Blood Culture adequate volume  Final   Culture   Final    NO GROWTH 3 DAYS Performed at [facility], [address], [place], [place] [address]    Report Status PENDING  Incomplete  Urine culture     Status: None   Collection Time: 09/18/2019  5:33 PM   Specimen: In/Out Cath Urine  Result Value Ref Range Status   Specimen Description   Final     IN/OUT CATH URINE Performed at [facility], [address] [place], [place] [address]    Special Requests   Final    NONE Performed at [facility], [address], [place], [place] [address]    Culture   Final    NO GROWTH Performed at [facility], [place] [address] [place], [place] [address]    Report Status 09/25/2019 FINAL  Final  MRSA PCR Screening     Status: Abnormal   Collection Time: 09/24/19  1:18 AM   Specimen: Nasal Mucosa; Nasopharyngeal  Result Value Ref Range Status   MRSA by PCR POSITIVE (A) NEGATIVE Final    Comment:        The GeneXpert MRSA Assay (FDA approved for NASAL specimens only), is one component of a comprehensive MRSA colonization surveillance program. It is not intended to diagnose MRSA infection nor to guide or monitor treatment for MRSA infections. RESULT CALLED TO, READ BACK BY AND VERIFIED WITH: Burnetta Sabin '@[id number]'  09/24/2019 KAY Performed at [facility], [address], [place], [place] [address]      Radiology Studies: No results found.  Scheduled Meds: . Chlorhexidine Gluconate Cloth  6 each Topical Daily  . mouth rinse  15 mL Mouth Rinse BID  . mouth rinse  15 mL Mouth Rinse BID  . mupirocin ointment   Nasal BID  . scopolamine  1 patch Transdermal Q72H   Continuous Infusions: . morphine 1 mg/hr (09/26/19 1135)     LOS: 3 days    Time spent: 35 minutes.   Barton Dubois, MD [facility] Pager [phone]   09/26/2019, 12:52 PM

## 2019-09-27 DIAGNOSIS — I495 Sick sinus syndrome: Secondary | ICD-10-CM

## 2019-09-27 DIAGNOSIS — I10 Essential (primary) hypertension: Secondary | ICD-10-CM

## 2019-09-27 DIAGNOSIS — Z515 Encounter for palliative care: Secondary | ICD-10-CM

## 2019-09-27 DIAGNOSIS — J9601 Acute respiratory failure with hypoxia: Secondary | ICD-10-CM

## 2019-09-27 DIAGNOSIS — Z7189 Other specified counseling: Secondary | ICD-10-CM

## 2019-09-27 DIAGNOSIS — Z66 Do not resuscitate: Secondary | ICD-10-CM

## 2019-09-28 LAB — CULTURE, BLOOD (ROUTINE X 2)
Culture: NO GROWTH
Culture: NO GROWTH
Special Requests: ADEQUATE

## 2019-09-28 NOTE — Progress Notes (Signed)
PT transferred to morgue. 

## 2019-10-13 NOTE — Care Management Important Message (Signed)
Important Message  Patient Details  Name: Zachary Warren MRN: [id number] Date of Birth: 01-Feb-1933   Medicare Important Message Given:  Yes(letter placed on chart, Larene Beach, RN will deliver due to contact precautions)     Tommy Medal 2019/10/13, 3:32 PM

## 2019-10-13 NOTE — Progress Notes (Signed)
In to assess pt, no respirations noted, pupils fixed and dilated, no apical HR auscultated in 54min. Notified charge nurse Abram Sander to assess, TOD 1948.   Notified next of kin, wife, Rushi Zingg who stated that she would be using [employer] in [place], [place].  Dr. Ander Slade made aware that patient expired and states she will sign death certificate. [place] donor services called and post-mortem check list completed.   Son and daughter in at this time to visit.

## 2019-10-13 NOTE — Discharge Summary (Signed)
Death Summary  Zachary Warren FGB:[id number] DOB: 30-Dec-1932 DOA: 10-16-2019  PCP: [name], NP  Admit date: 10/16/19 Date of Death: 10/21/2019 Time of Death: 19:50 Notification: [name], NP notified of death of October 21, 2019   History of present illness:  Zachary Warren is a 86 y.o. male with a history of dementia, COPD, atrial fibrillation, bradycardia tachycardia syndrome,coronary artery disease, hypertension and seizures. Zachary Warren was brought to the ED from nursing home with reports of worsening mental status, difficulty breathing, and low oxygen levels (O2 sat in the 50's on arrival). On initial evaluation patient was unresponsive, non-verbal and unable to follow commands. History was obtained from chart review and talking to family (spouse, 2 daughters and son). Patient positive for MRSA PCR, meeting sepsis criteria with organ dysfunction (ARF and resp distress), A. Fib with RVR, severe hypernatremia and positive COVID-19.  Zachary Warren failed to significant improved after aggressive IVF's, IV steroids and Remdesivir and cardizem drip, after 48 hours, GOC discussion sustained with family, palliative care was involved and decision made to transition to comfort care and symptomatic management only. Patient started on morphine drip to assist with air hunger and resp distress. Patient was DNR/DNI prior to admission. He expired peacefully on 10-20-19 and 19:50.  Final Diagnoses:  Sepsis due to Pneumonia due to COVID-19 virus and HCAP/aspiration. Acute metabolic encephalopathy  CAD, NATIVE VESSEL BRADYCARDIA-TACHYCARDIA SYNDROME Persistent atrial fibrillation (HCC) Hypertension Dementia (HCC) with depression COPD (chronic obstructive pulmonary disease) (HCC) Chronic diastolic HF Pressure injury (deep tissue injury on his sacrum and right heel) Hypernatremia ARF (acute renal failure) (HCC) Pressure injury of skin Palliative care by specialist DNR (do  not resuscitate) Comfort measures only status Advanced care planning/counseling discussion Goals of care, counseling/discussion Acute respiratory failure with hypoxia [facility]) Terminal care  Hospital course by Problems and treatment/plan: 1-acute respiratory failure with hypoxia in the setting of Covid 19 pneumonia and HCAP/aspiration. -Patient with positive MRSA screening -After transition to comfort care and symptomatic management only antibiotics, steroids, remdesivir, Cardizem drip and any other interventions not intended to control symptoms has been discontinued. -Anticipate hospital death -Appreciate palliative care help and assistance. -continue morphine drip.  2-sepsis in the setting of COVID-19 infection and pneumonia (HCAP/aspiration) -After 48 hours of aggressive treatment using remdesivir, steroids, IV fluids and broad-spectrum antibiotics; patient conditions remain critical without significant improvement. -Discussion with family and involvement of palliative care has reviewed goals of cares and advance directives with decision to transition to comfort and symptomatic management. -Not pursuing any further invasive intervention or antibiotic therapy at this time. -Continue end-of-life care. -Patient has been initiated on morphine drip; appears to be comfortable. -Patient met sepsis criteria on presentation with elevated respiratory rate, fever, elevated heart rate and demonstrated source of infection to be his lungs from bacterial bilateral pneumonia. -Patient has organ dysfunction with encephalopathy and acute renal failure.  3-acute metabolic encephalopathy -In the setting of acute infection and abnormal electrolytes -Patient with underlying history of dementia. -Will transition to comfort care and symptomatic management only -Discontinued telemetry.  4-hypernatremia -Sodium last checked was 165. -Most likely in the setting of severe dehydration continue use of diuretics  prior to admission. -Goals of care discussion with family and palliative care service as determined transition to symptomatic management and comfort only -No further blood work anticipated.  5-Acute renal failure -At this moment no further blood work anticipated -Prior goals of care discussion care transition to comfort and symptomatic management only.  6-tachybradycardia syndrome -Status  post pacemaker -Patient's care has been transitioned to full comfort -Cardizem drip discontinued -If needed will add low-dose beta-blocker to assist with uncomfortable palpitations.  7-increase oral secretion -Started on Robinul -Patient care transition to come for symptomatic management only. -he is DNR/DNI [facility] death anticipated  8-pressure injury of the skin -Present prior to admission -Continue constant repositioning and preventive measures. -No open wounds/drainage or signs of superimposed infection presently..  9-dementia with depression -continue holding Wellbutrin, Topamax, trazodone, Risperdal and prozac -Transition to full comfort care -Haldol, Ativan and morphine drip initiated.  10- history of COPD -Currently no wheezing -Acute respiratory failure as mentioned above in the setting of pneumonia and COVID-19 infection -Continue as needed bronchodilators. -full comfort care and symptomatic management decided.  11-essential hypertension -Blood pressure soft but stable -will monitor VS as per protocol -Continue holding any antihypertensive medication for now -transition to full comfort care.  09-WJXBJYN diastolic heart failure -Stable and compensated. -Continue holding diuretics -Patient has been transitioned to full comfort care; continue morphine drip.  13-DNR/DNI -patient with a prior to admission wishes to be DNR/DNI -Prognosis is poor and hospital death anticipated. -Palliative care service has discussed with family members in details and transition to full  comfort care decided. -Continue morphine drip     The results of significant diagnostics from this hospitalization (including imaging, microbiology, ancillary and laboratory) are listed below for reference.    Significant Diagnostic Studies: DG Chest Port 1 View  Result Date: 09/20/2019 CLINICAL DATA:  COVID positive, altered mental status, shortness of breath EXAM: PORTABLE CHEST 1 VIEW COMPARISON:  03/16/2019 FINDINGS: Cardiomegaly status post median sternotomy and CABG with left chest multi lead pacer. Heterogeneous bilateral airspace opacity, most conspicuous in the right midlung. The visualized skeletal structures are unremarkable. IMPRESSION: Heterogeneous bilateral airspace opacities, most conspicuous in the right midlung, in keeping with reported diagnosis of COVID 19. Electronically Signed   By: Eddie Candle M.D.   On: 09/30/2019 14:59    Microbiology: Recent Results (from the past 240 hour(s))  Blood Culture (routine x 2)     Status: None (Preliminary result)   Collection Time: 09/22/2019  2:05 PM   Specimen: BLOOD LEFT FOREARM  Result Value Ref Range Status   Specimen Description   Final    BLOOD LEFT FOREARM BOTTLES DRAWN AEROBIC AND ANAEROBIC   Special Requests   Final    Blood Culture results may not be optimal due to an inadequate volume of blood received in culture bottles   Culture   Final    NO GROWTH 4 DAYS Performed at [facility], [address] [place], [place] [address]    Report Status PENDING  Incomplete  Blood Culture (routine x 2)     Status: None (Preliminary result)   Collection Time: 10/06/2019  2:36 PM   Specimen: BLOOD LEFT WRIST  Result Value Ref Range Status   Specimen Description BLOOD LEFT WRIST BOTTLES DRAWN AEROBIC ONLY  Final   Special Requests Blood Culture adequate volume  Final   Culture   Final    NO GROWTH 4 DAYS Performed at [facility], [address] [place], [place] [address]    Report Status PENDING  Incomplete  Urine culture      Status: None   Collection Time: 09/17/2019  5:33 PM   Specimen: In/Out Cath Urine  Result Value Ref Range Status   Specimen Description   Final    IN/OUT CATH URINE Performed at [facility], [address], [place], [place]  [address]    Special Requests   Final    NONE Performed at [facility], [address] [place], [place] [address]    Culture   Final    NO GROWTH Performed at [facility], [place] [address], [place], [place] [address]    Report Status 09/25/2019 FINAL  Final  MRSA PCR Screening     Status: Abnormal   Collection Time: 09/24/19  1:18 AM   Specimen: Nasal Mucosa; Nasopharyngeal  Result Value Ref Range Status   MRSA by PCR POSITIVE (A) NEGATIVE Final    Comment:        The GeneXpert MRSA Assay (FDA approved for NASAL specimens only), is one component of a comprehensive MRSA colonization surveillance program. It is not intended to diagnose MRSA infection nor to guide or monitor treatment for MRSA infections. RESULT CALLED TO, READ BACK BY AND VERIFIED WITH: Burnetta Sabin _0  09/24/2019 KAY Performed at [facility], [address], [place], [place] [address]      Labs: Basic Metabolic Panel: Recent Labs  Lab 09/20/2019 1405 10/08/2019 2121 09/24/19 0530 09/25/19 0419  NA 175* 172* 170* 165*  K 3.5 3.5 3.2* 3.5  CL >130* >130* >130* >130*  CO2 _1 GLUCOSE 112* 174* 349* 315*  BUN 97* 95* 99* 105*  CREATININE 2.77* 2.51* 2.40* 2.37*  CALCIUM 9.2 8.2* 8.0* 8.2*  MG  --   --   --  3.1*   Liver Function Tests: Recent Labs  Lab 10/11/2019 1405 09/24/19 0530 09/25/19 0419  AST 32 21 14*  ALT _2 ALKPHOS 52 42 46  BILITOT 0.7 0.6 0.8  PROT 7.1 5.7* 5.4*  ALBUMIN 2.9* 2.2* 2.0*   CBC: Recent Labs  Lab 10/02/2019 1405 09/24/19 0530 09/25/19 0419  WBC 12.1* 8.1 13.1*  NEUTROABS 10.6* 7.6 12.5*  HGB 15.1 12.4* 12.2*  HCT 54.3* 46.3 44.1  MCV 101.9* 105.0* 102.6*  PLT 164 122* 131*   Urinalysis    Component Value  Date/Time   COLORURINE YELLOW 10/04/2019 1733   APPEARANCEUR CLOUDY (A) 10/01/2019 1733   LABSPEC 1.017 10/01/2019 1733   PHURINE 5.0 10/07/2019 1733   GLUCOSEU NEGATIVE 09/18/2019 1733   HGBUR MODERATE (A) 10/11/2019 1733   BILIRUBINUR NEGATIVE 10/06/2019 1733   KETONESUR NEGATIVE 10/07/2019 1733   PROTEINUR 30 (A) 10/07/2019 1733   UROBILINOGEN 0.2 10/23/2014 2359   NITRITE NEGATIVE 10/12/2019 1733   LEUKOCYTESUR NEGATIVE 09/25/2019 1733   Sepsis Labs Invalid input(s): PROCALCITONIN,  WBC,  LACTICIDVEN  SIGNED:  Barton Dubois, MD  [facility] 09/28/2019, 8:08 AM   [phone]

## 2019-10-13 NOTE — Progress Notes (Signed)
PROGRESS NOTE    Zachary Warren  ZDG:[id number] DOB: March 21, 1933 DOA: 10/11/2019 PCP: [name], NP     Brief Narrative:  86 y.o. male with medical history significant for dementia, COPD, atrial fibrillation, bradycardia tachycardia syndrome, coronary artery disease, hypertension, seizures.  Patient was brought to the ED from nursing home with reports of altered mental status, difficulty breathing, and low oxygen levels.  On initial evaluation patient was unresponsive, with time of my evaluation patient opened his eyes but was not verbally answering questions or following directions.  History is obtained from chart review and talking to family spouse 2 daughters and son. At baseline patient is able to awake and alert, able to communicate, answers questions appropriately, he has mild dementia. The first liter reported hypoxia O2 sats in the 50s, with tachycardia and fevers.  ED Course: Temperature 102.8, tachycardic to 179, O2 sats 54% on nonrebreather mask, subsequently switched to BiPAP- [employer]-this was later discontinued.  Markedly elevated sodium at 175, chloride greater than 130, creatinine elevated 2.77, WBC 12.1.  Lactic acid 1.9.  Procalcitonin elevated at 3.24.  Portable chest x-ray heterogeneous bilateral airspace opacities most conspicuous in the right mid lung in keeping with reported diagnosis of COVID-19.  Patient was started on IV vancomycin, metronidazole and aztreonam.  1 L bolus normal saline given.  Cardizem 50m bolus given, and GTT initiated, 10 mg dexamethasone given. EDP talked to patient's wife and son decision was made to make patient comfort care.  Assessment & Plan: 1-acute respiratory failure with hypoxia in the setting of Covid 19 pneumonia and HCAP/aspiration. -Patient with positive MRSA screening -After transition to comfort care and symptomatic management only antibiotics, steroids, remdesivir, Cardizem drip and any other interventions not intended  to control symptoms has been discontinued. -Anticipate hospital death -Appreciate palliative care help and assistance. -continue morphine drip.  2-sepsis in the setting of COVID-19 infection and pneumonia (HCAP/aspiration) -After 48 hours of aggressive treatment using remdesivir, steroids, IV fluids and broad-spectrum antibiotics; patient conditions remain critical without significant improvement. -Discussion with family and involvement of palliative care has reviewed goals of cares and advance directives with decision to transition to comfort and symptomatic management. -Not pursuing any further invasive intervention or antibiotic therapy at this time. -Continue end-of-life care. -Patient has been initiated on morphine drip; appears to be comfortable. -Patient met sepsis criteria on presentation with elevated respiratory rate, fever, elevated heart rate and demonstrated source of infection to be his lungs from bacterial bilateral pneumonia. -Patient has organ dysfunction with encephalopathy and acute renal failure.  3-acute metabolic encephalopathy -In the setting of acute infection and abnormal electrolytes -Patient with underlying history of dementia. -Will transition to comfort care and symptomatic management only -Discontinued telemetry.  4-hypernatremia -Sodium last checked was 165. -Most likely in the setting of severe dehydration continue use of diuretics prior to admission. -Goals of care discussion with family and palliative care service as determined transition to symptomatic management and comfort only -No further blood work anticipated.  5-Acute renal failure -At this moment no further blood work anticipated -Prior goals of care discussion care transition to comfort and symptomatic management only.  6-tachybradycardia syndrome -Status post pacemaker -Patient's care has been transitioned to full comfort -Cardizem drip discontinued -If needed will add low-dose  beta-blocker to assist with uncomfortable palpitations.  7-increase oral secretion -Started on Robinul -Patient care transition to come for symptomatic management only. -he is DNR/DNI -Hospital death anticipated  8-pressure injury of the skin -Present prior to admission -Continue  constant repositioning and preventive measures. -No open wounds/drainage or signs of superimposed infection presently..  9-dementia with depression -continue holding Wellbutrin, Topamax, trazodone, Risperdal and prozac -Transition to full comfort care -Haldol, Ativan and morphine drip initiated.  10- history of COPD -Currently no wheezing -Acute respiratory failure as mentioned above in the setting of pneumonia and COVID-19 infection -Continue as needed bronchodilators. -full comfort care and symptomatic management decided.  11-essential hypertension -Blood pressure soft but stable -will monitor VS as per protocol -Continue holding any antihypertensive medication for now -transition to full comfort care.  01-KPVVZSM diastolic heart failure -Stable and compensated. -Continue holding diuretics -Patient has been transitioned to full comfort care; continue morphine drip.  13-DNR/DNI -patient with a prior to admission wishes to be DNR/DNI -Prognosis is poor and hospital death anticipated. -Palliative care service has discussed with family members in details and transition to full comfort care decided. -Continue morphine drip   DVT prophylaxis: Lovenox Code Status: DNR Family Communication: none at bedside, wife updated over the phone by palliative service.  Disposition Plan: Remains inpatient, continue symptomatic management and comfort care.  Anticipate hospital death.  Continue morphine drip.  Consultants:   Palliative care  Procedures:   See below for x-ray results  Antimicrobials:  Anti-infectives (From admission, onward)   Start     Dose/Rate Route Frequency Ordered Stop   09/25/19  1400  ceFEPIme (MAXIPIME) 2 g in sodium chloride 0.9 % 100 mL IVPB  Status:  Discontinued     2 g 200 mL/hr over 30 Minutes Intravenous Every 24 hours 09/25/19 1008 09/25/19 1218   09/24/19 1600  vancomycin (VANCOCIN) IVPB 750 mg/150 ml premix  Status:  Discontinued     750 mg 150 mL/hr over 60 Minutes Intravenous Every 24 hours 09/30/2019 1502 09/25/19 1218   09/24/19 1000  remdesivir 100 mg in sodium chloride 0.9 % 100 mL IVPB  Status:  Discontinued     100 mg 200 mL/hr over 30 Minutes Intravenous Daily 10/02/2019 2042 09/25/19 1218   10/05/2019 2300  metroNIDAZOLE (FLAGYL) IVPB 500 mg  Status:  Discontinued     500 mg 100 mL/hr over 60 Minutes Intravenous Every 8 hours 09/20/2019 1951 09/25/19 1008   10/08/2019 2200  aztreonam (AZACTAM) 1 g in sodium chloride 0.9 % 100 mL IVPB  Status:  Discontinued     1 g 200 mL/hr over 30 Minutes Intravenous Every 8 hours 10/04/2019 1502 09/25/19 1008   09/22/2019 2000  remdesivir 200 mg in sodium chloride 0.9% 250 mL IVPB     200 mg 580 mL/hr over 30 Minutes Intravenous Once 09/19/2019 1945 10/04/2019 2321   09/25/2019 1515  vancomycin (VANCOCIN) 1,500 mg in sodium chloride 0.9 % 500 mL IVPB     1,500 mg 250 mL/hr over 120 Minutes Intravenous  Once 10/07/2019 1435 09/17/2019 1939   10/09/2019 1415  aztreonam (AZACTAM) 2 g in sodium chloride 0.9 % 100 mL IVPB     2 g 200 mL/hr over 30 Minutes Intravenous  Once 10/11/2019 1406 09/15/2019 1606   10/12/2019 1415  metroNIDAZOLE (FLAGYL) IVPB 500 mg     500 mg 100 mL/hr over 60 Minutes Intravenous  Once 09/16/2019 1406 09/19/2019 1606   10/09/2019 1415  vancomycin (VANCOCIN) IVPB 1000 mg/200 mL premix  Status:  Discontinued     1,000 mg 200 mL/hr over 60 Minutes Intravenous  Once 09/28/2019 1406 09/13/2019 1435      Subjective: Afebrile, appears to be more comfortable; nonverbal intermittent episode of apnea appreciated.  Objective: Vitals:   09/26/19 0630 09/26/19 0631 09/26/19 1037 09/26/19 1117  BP:    (!) 130/96  Pulse: (!) 125    67  Resp:  (!) 24  (!) 22  Temp:    99.1 F (37.3 C)  TempSrc:    Oral  SpO2: (!) 83%  92% (!) 86%  Weight:      Height:        Intake/Output Summary (Last 24 hours) at 2019/10/05 1335 Last data filed at 10/05/2019 2641 Gross per 24 hour  Intake 16.37 ml  Output 750 ml  Net -733.63 ml   Filed Weights   09/24/19 0215 09/25/19 0530 09/25/19 0630  Weight: 66.5 kg 66.8 kg 67.2 kg    Examination: General exam: Currently nonverbal; with intermittent episodes of apnea.  Afebrile and overall more comfortable after morphine drip initiated.  No labored breathing appreciated at this time. Respiratory system: Positive rhonchi bilaterally.  No wheezing. Cardiovascular system: Irregular, no rubs, no gallops. Gastrointestinal system: Abdomen is nondistended, soft and nontender. No organomegaly or masses felt. Normal bowel sounds heard. Central nervous system: Unable to properly assess given current mental status. Extremities: No cyanosis or clubbing. Skin: Unchanged prior to admission deep tissue injury appreciated on his sacrum and right heel. Psychiatry: No agitation appreciated.   Data Reviewed: I have personally reviewed following labs and imaging studies  CBC: Recent Labs  Lab 09/19/2019 1405 09/24/19 0530 09/25/19 0419  WBC 12.1* 8.1 13.1*  NEUTROABS 10.6* 7.6 12.5*  HGB 15.1 12.4* 12.2*  HCT 54.3* 46.3 44.1  MCV 101.9* 105.0* 102.6*  PLT 164 122* 583*   Basic Metabolic Panel: Recent Labs  Lab 10/01/2019 1405 10/02/2019 2121 09/24/19 0530 09/25/19 0419  NA 175* 172* 170* 165*  K 3.5 3.5 3.2* 3.5  CL >130* >130* >130* >130*  CO2 '28 22 24 24  ' GLUCOSE 112* 174* 349* 315*  BUN 97* 95* 99* 105*  CREATININE 2.77* 2.51* 2.40* 2.37*  CALCIUM 9.2 8.2* 8.0* 8.2*  MG  --   --   --  3.1*   GFR: Estimated Creatinine Clearance: 22.1 mL/min (A) (by C-G formula based on SCr of 2.37 mg/dL (H)).   Liver Function Tests: Recent Labs  Lab 09/21/2019 1405 09/24/19 0530 09/25/19 0419   AST 32 21 14*  ALT '26 22 19  ' ALKPHOS 52 42 46  BILITOT 0.7 0.6 0.8  PROT 7.1 5.7* 5.4*  ALBUMIN 2.9* 2.2* 2.0*   Coagulation Profile: Recent Labs  Lab 09/19/2019 1405  INR 1.2   Urine analysis:    Component Value Date/Time   COLORURINE YELLOW 09/15/2019 1733   APPEARANCEUR CLOUDY (A) 10/05/2019 1733   LABSPEC 1.017 10/12/2019 1733   PHURINE 5.0 10/12/2019 1733   GLUCOSEU NEGATIVE 10/12/2019 1733   HGBUR MODERATE (A) 09/28/2019 1733   BILIRUBINUR NEGATIVE 10/06/2019 1733   KETONESUR NEGATIVE 09/21/2019 1733   PROTEINUR 30 (A) 09/15/2019 1733   UROBILINOGEN 0.2 10/23/2014 2359   NITRITE NEGATIVE 09/16/2019 1733   LEUKOCYTESUR NEGATIVE 10/01/2019 1733    Recent Results (from the past 240 hour(s))  Blood Culture (routine x 2)     Status: None (Preliminary result)   Collection Time: 09/13/2019  2:05 PM   Specimen: BLOOD LEFT FOREARM  Result Value Ref Range Status   Specimen Description   Final    BLOOD LEFT FOREARM BOTTLES DRAWN AEROBIC AND ANAEROBIC   Special Requests   Final    Blood Culture results may not be optimal due to an inadequate volume  of blood received in culture bottles   Culture   Final    NO GROWTH 4 DAYS Performed at [facility], [address], [place], [place] [address]    Report Status PENDING  Incomplete  Blood Culture (routine x 2)     Status: None (Preliminary result)   Collection Time: 09/19/2019  2:36 PM   Specimen: BLOOD LEFT WRIST  Result Value Ref Range Status   Specimen Description BLOOD LEFT WRIST BOTTLES DRAWN AEROBIC ONLY  Final   Special Requests Blood Culture adequate volume  Final   Culture   Final    NO GROWTH 4 DAYS Performed at [facility], [address], [place], [place] [address]    Report Status PENDING  Incomplete  Urine culture     Status: None   Collection Time: 10/10/2019  5:33 PM   Specimen: In/Out Cath Urine  Result Value Ref Range Status   Specimen Description   Final    IN/OUT CATH URINE Performed at [facility], [address] [place], [place] [address]    Special Requests   Final    NONE Performed at [facility], [address] [place], [place] [address]    Culture   Final    NO GROWTH Performed at [facility], [place] [address] [place], [place] [address]    Report Status 09/25/2019 FINAL  Final  MRSA PCR Screening     Status: Abnormal   Collection Time: 09/24/19  1:18 AM   Specimen: Nasal Mucosa; Nasopharyngeal  Result Value Ref Range Status   MRSA by PCR POSITIVE (A) NEGATIVE Final    Comment:        The GeneXpert MRSA Assay (FDA approved for NASAL specimens only), is one component of a comprehensive MRSA colonization surveillance program. It is not intended to diagnose MRSA infection nor to guide or monitor treatment for MRSA infections. RESULT CALLED TO, READ BACK BY AND VERIFIED WITH: Burnetta Sabin '@[id number]'  09/24/2019 KAY Performed at [facility], [address] [place], [place] [address]      Radiology Studies: No results found.  Scheduled Meds: . Chlorhexidine Gluconate Cloth  6 each Topical Daily  . mouth rinse  15 mL Mouth Rinse BID  . mouth rinse  15 mL Mouth Rinse BID  . mupirocin ointment   Nasal BID  . scopolamine  1 patch Transdermal Q72H   Continuous Infusions: . morphine 1 mg/hr (09/26/19 1800)     LOS: 4 days    Time spent: 35 minutes.   Barton Dubois, MD [facility] Pager [phone]   10-15-2019, 1:35 PM

## 2019-10-13 NOTE — Progress Notes (Signed)
Daily Progress Note   Patient Name: Zachary Warren       Date: 10/06/2019 DOB: 04-Apr-1933  Age: 86 y.o. MRN#: [id number] Attending Physician: Barton Dubois, MD Primary Care Physician: [name], NP Admit Date: 09/20/2019  Reason for Consultation/Follow-up: Establishing goals of care  Subjective: Patient in bed. Does not respond to my voice or touch. Breathing is shallow.  Appears comfortable.   Review of Systems  Unable to perform ROS: Dementia    Length of Stay: 4  Current Medications: Scheduled Meds:  . Chlorhexidine Gluconate Cloth  6 each Topical Daily  . mouth rinse  15 mL Mouth Rinse BID  . mouth rinse  15 mL Mouth Rinse BID  . mupirocin ointment   Nasal BID  . scopolamine  1 patch Transdermal Q72H    Continuous Infusions: . morphine 1 mg/hr (09/26/19 1800)    PRN Meds: acetaminophen, albuterol, antiseptic oral rinse, glycopyrrolate **OR** glycopyrrolate **OR** glycopyrrolate, haloperidol **OR** haloperidol **OR** haloperidol lactate, LORazepam **OR** LORazepam **OR** LORazepam, morphine, ondansetron **OR** ondansetron (ZOFRAN) IV, polyvinyl alcohol  Physical Exam Vitals and nursing note reviewed.  Constitutional:      Appearance: He is ill-appearing.     Comments: Thin, frail  Pulmonary:     Breath sounds: Examination of the right-upper field reveals decreased breath sounds. Examination of the left-upper field reveals decreased breath sounds. Decreased breath sounds present.  Skin:    General: Skin is warm.     Comments: flushed  Neurological:     Comments: nonresponsive             Vital Signs: BP (!) 130/96   Pulse 67   Temp 99.1 F (37.3 C) (Oral)   Resp (!) 22   Ht 6' (1.829 m)   Wt 67.2 kg   SpO2 (!) 86%   BMI 20.09 kg/m  SpO2: SpO2:  (!) 86 % O2 Device: O2 Device: Nasal Cannula O2 Flow Rate: O2 Flow Rate (L/min): 2 L/min  Intake/output summary:   Intake/Output Summary (Last 24 hours) at 2019-10-06 1355 Last data filed at 06-Oct-2019 0605 Gross per 24 hour  Intake 16.37 ml  Output 750 ml  Net -733.63 ml   LBM:   Baseline Weight: Weight: 77 kg Most recent weight: Weight: 67.2 kg       Palliative Assessment/Data: PPS: 10%  Patient Active Problem List   Diagnosis Date Noted  . Terminal care   . Palliative care by specialist   . DNR (do not resuscitate)   . Comfort measures only status   . Advanced care planning/counseling discussion   . Goals of care, counseling/discussion   . Acute respiratory failure with hypoxia ([place])   . COVID-19 virus infection   . Pressure injury of skin 09/24/2019  . Pneumonia due to COVID-19 virus 09/26/2019  . Hypernatremia 09/26/2019  . AKI (acute kidney injury) ([place]) 10/04/2019  . Tachycardia-bradycardia ([place])   . Seizures ([place])   . PVD (peripheral vascular disease) ([place])   . Pneumonia   . Persistent atrial fibrillation ([place])   . Pacemaker   . OSA (obstructive sleep apnea)   . Memory impairment   . Hypertension   . History of kidney stones   . Depression   . Dementia ([place])   . DDD (degenerative disc disease), cervical   . Coronary artery disease   . COPD (chronic obstructive pulmonary disease) ([place])   . LLL pneumonia 07/11/2017  . HCAP (healthcare-associated pneumonia) 07/11/2017  . Fever 07/10/2017  . Hypokalemia 10/25/2014  . MCI (mild cognitive impairment) with memory loss 07/24/2014  . Cardiac pacemaker in situ 12/25/2013  . Aneurysm ([place]) 06/28/2012  . CVA (cerebral infarction) 02/05/2012  . Dysphagia as late effect of stroke 02/05/2012  . GERD (gastroesophageal reflux disease) 02/05/2012  . 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 Hyperlipidemia 11/21/2010  . Essential hypertension 11/21/2010  . CAD, NATIVE VESSEL 11/21/2010  . Prostate  cancer [facility]) 10/12/2004    Palliative Care Assessment & Plan   Patient Profile: 86 y.o. male  with past medical history of dementia, prostate cancer, CABG x 5, admitted on 09/22/2019 with respiratory distress. Workup revealed COVID pneumonia, possible aspiration bacteria pnuemonia, sepsis, hypovolemic hypernatremia, acute kidney injury, tachybradycardia syndrome. Has had aggressive medical treatment with IV fluids, labs, cardizem infusion, COVID medications- however, he remains unresponsive, not eating or drinking, HR remains elevated and difficult to control due to hypotension. Palliative medicine consulted for [place].   Assessment/Recommendations/Plan   Patient is dying  Continue current comfort measures  Goals of Care and Additional Recommendations:  Limitations on Scope of Treatment: Full Comfort Care  Code Status:  DNR  Prognosis:   Hours - Days  Discharge Planning:  Anticipated Hospital Death  Care plan was discussed with patient's nurse.  Thank you for allowing the Palliative Medicine Team to assist in the care of this patient.   Time In: 1030 Time Out: 1105 Total Time 35 mins Prolonged Time Billed no      Greater than 50%  of this time was spent counseling and coordinating care related to the above assessment and plan.  Mariana Kaufman, AGNP-C Palliative Medicine   Please contact Palliative Medicine Team phone at [phone] for questions and concerns.

## 2019-10-13 DEATH — deceased

## 2019-12-12 ENCOUNTER — Telehealth: Payer: Self-pay

## 2019-12-12 NOTE — Telephone Encounter (Signed)
New Message   Patient's wife wants to know if device equipment would need to be returned since the patient is deceased. Please advise.

## 2019-12-12 NOTE — Telephone Encounter (Signed)
I called the pt wife to let her know I will order her a return kit for the pt home remote monitor. I also needed to verify the pt address. I got no answer/ no voicemail.

## 2019-12-13 NOTE — Telephone Encounter (Signed)
LMOVM for pt wife to call me to verify the address so I can send a return kit.
